# Patient Record
Sex: Male | Born: 1955 | Race: Black or African American | Hispanic: No | Marital: Single | Smoking: Former smoker
Health system: Southern US, Community
[De-identification: ages and names within clinical notes are randomized; demographics above are authoritative.]

## PROBLEM LIST (undated history)

## (undated) DIAGNOSIS — Z808 Family history of malignant neoplasm of other organs or systems: Secondary | ICD-10-CM

## (undated) DIAGNOSIS — H409 Unspecified glaucoma: Secondary | ICD-10-CM

## (undated) DIAGNOSIS — E079 Disorder of thyroid, unspecified: Secondary | ICD-10-CM

## (undated) DIAGNOSIS — I712 Thoracic aortic aneurysm, without rupture, unspecified: Secondary | ICD-10-CM

## (undated) DIAGNOSIS — D649 Anemia, unspecified: Secondary | ICD-10-CM

## (undated) DIAGNOSIS — H9190 Unspecified hearing loss, unspecified ear: Secondary | ICD-10-CM

## (undated) DIAGNOSIS — N182 Chronic kidney disease, stage 2 (mild): Secondary | ICD-10-CM

## (undated) DIAGNOSIS — C469 Kaposi's sarcoma, unspecified: Secondary | ICD-10-CM

## (undated) DIAGNOSIS — I10 Essential (primary) hypertension: Secondary | ICD-10-CM

## (undated) DIAGNOSIS — E785 Hyperlipidemia, unspecified: Secondary | ICD-10-CM

## (undated) DIAGNOSIS — Z952 Presence of prosthetic heart valve: Secondary | ICD-10-CM

## (undated) DIAGNOSIS — I3139 Other pericardial effusion (noninflammatory): Secondary | ICD-10-CM

## (undated) DIAGNOSIS — I2510 Atherosclerotic heart disease of native coronary artery without angina pectoris: Secondary | ICD-10-CM

## (undated) DIAGNOSIS — E669 Obesity, unspecified: Secondary | ICD-10-CM

## (undated) DIAGNOSIS — K648 Other hemorrhoids: Secondary | ICD-10-CM

## (undated) DIAGNOSIS — T7840XA Allergy, unspecified, initial encounter: Secondary | ICD-10-CM

## (undated) DIAGNOSIS — M549 Dorsalgia, unspecified: Secondary | ICD-10-CM

## (undated) DIAGNOSIS — I313 Pericardial effusion (noninflammatory): Secondary | ICD-10-CM

## (undated) HISTORY — DX: Unspecified glaucoma: H40.9

## (undated) HISTORY — PX: ABDOMINAL AORTIC ANEURYSM REPAIR: SUR1152

## (undated) HISTORY — DX: Disorder of thyroid, unspecified: E07.9

## (undated) HISTORY — DX: Dorsalgia, unspecified: M54.9

## (undated) HISTORY — DX: Chronic kidney disease, stage 2 (mild): N18.2

## (undated) HISTORY — DX: Obesity, unspecified: E66.9

## (undated) HISTORY — DX: Thoracic aortic aneurysm, without rupture: I71.2

## (undated) HISTORY — DX: Other pericardial effusion (noninflammatory): I31.39

## (undated) HISTORY — PX: KNEE ARTHROSCOPY: SUR90

## (undated) HISTORY — DX: Allergy, unspecified, initial encounter: T78.40XA

## (undated) HISTORY — DX: Essential (primary) hypertension: I10

## (undated) HISTORY — DX: Other hemorrhoids: K64.8

## (undated) HISTORY — PX: WISDOM TOOTH EXTRACTION: SHX21

## (undated) HISTORY — DX: Family history of malignant neoplasm of other organs or systems: Z80.8

## (undated) HISTORY — DX: Thoracic aortic aneurysm, without rupture, unspecified: I71.20

## (undated) HISTORY — DX: Hyperlipidemia, unspecified: E78.5

## (undated) HISTORY — PX: AORTIC VALVE REPLACEMENT: SHX41

## (undated) HISTORY — DX: Atherosclerotic heart disease of native coronary artery without angina pectoris: I25.10

## (undated) HISTORY — DX: Unspecified hearing loss, unspecified ear: H91.90

## (undated) HISTORY — DX: Kaposi's sarcoma, unspecified: C46.9

## (undated) HISTORY — DX: Anemia, unspecified: D64.9

## (undated) HISTORY — DX: Pericardial effusion (noninflammatory): I31.3

## (undated) HISTORY — PX: OTHER SURGICAL HISTORY: SHX169

---

## 2005-03-13 ENCOUNTER — Ambulatory Visit: Payer: Self-pay | Admitting: Internal Medicine

## 2005-03-20 ENCOUNTER — Ambulatory Visit: Payer: Self-pay | Admitting: Cardiology

## 2005-03-27 ENCOUNTER — Ambulatory Visit: Payer: Self-pay | Admitting: Cardiology

## 2005-03-31 ENCOUNTER — Ambulatory Visit: Payer: Self-pay | Admitting: Cardiology

## 2005-04-11 ENCOUNTER — Ambulatory Visit: Payer: Self-pay | Admitting: Cardiology

## 2005-04-25 ENCOUNTER — Ambulatory Visit: Payer: Self-pay | Admitting: Cardiology

## 2005-05-17 ENCOUNTER — Ambulatory Visit: Payer: Self-pay | Admitting: Cardiology

## 2005-06-13 ENCOUNTER — Ambulatory Visit: Payer: Self-pay | Admitting: Cardiology

## 2005-06-27 ENCOUNTER — Ambulatory Visit: Payer: Self-pay | Admitting: Cardiology

## 2005-07-19 ENCOUNTER — Ambulatory Visit: Payer: Self-pay | Admitting: Internal Medicine

## 2005-08-03 ENCOUNTER — Ambulatory Visit: Payer: Self-pay | Admitting: Internal Medicine

## 2005-08-24 ENCOUNTER — Ambulatory Visit: Payer: Self-pay | Admitting: Cardiology

## 2005-09-06 ENCOUNTER — Ambulatory Visit: Payer: Self-pay | Admitting: Cardiology

## 2005-09-19 ENCOUNTER — Ambulatory Visit: Payer: Self-pay | Admitting: Cardiology

## 2005-10-04 ENCOUNTER — Ambulatory Visit: Payer: Self-pay | Admitting: Cardiology

## 2005-10-24 ENCOUNTER — Ambulatory Visit: Payer: Self-pay | Admitting: Cardiology

## 2005-11-21 ENCOUNTER — Ambulatory Visit: Payer: Self-pay | Admitting: Cardiology

## 2005-12-19 ENCOUNTER — Ambulatory Visit: Payer: Self-pay | Admitting: *Deleted

## 2006-01-24 ENCOUNTER — Ambulatory Visit: Payer: Self-pay | Admitting: Cardiology

## 2006-02-06 ENCOUNTER — Ambulatory Visit: Payer: Self-pay | Admitting: Cardiology

## 2006-02-28 ENCOUNTER — Ambulatory Visit: Payer: Self-pay | Admitting: Cardiology

## 2006-03-07 ENCOUNTER — Ambulatory Visit: Payer: Self-pay | Admitting: Internal Medicine

## 2006-03-14 ENCOUNTER — Encounter: Payer: Self-pay | Admitting: Cardiology

## 2006-03-14 ENCOUNTER — Ambulatory Visit: Payer: Self-pay

## 2006-03-20 ENCOUNTER — Ambulatory Visit: Payer: Self-pay | Admitting: Gastroenterology

## 2006-03-21 ENCOUNTER — Ambulatory Visit: Payer: Self-pay | Admitting: Cardiology

## 2006-03-27 ENCOUNTER — Ambulatory Visit: Payer: Self-pay | Admitting: Cardiology

## 2006-04-06 ENCOUNTER — Ambulatory Visit: Payer: Self-pay | Admitting: Cardiology

## 2006-05-02 ENCOUNTER — Ambulatory Visit: Payer: Self-pay | Admitting: Internal Medicine

## 2006-05-30 ENCOUNTER — Ambulatory Visit: Payer: Self-pay | Admitting: Cardiovascular Disease

## 2006-06-27 ENCOUNTER — Ambulatory Visit: Payer: Self-pay | Admitting: Cardiovascular Disease

## 2006-08-16 ENCOUNTER — Ambulatory Visit: Payer: Self-pay | Admitting: *Deleted

## 2006-08-30 ENCOUNTER — Ambulatory Visit: Payer: Self-pay | Admitting: Cardiology

## 2006-09-27 ENCOUNTER — Ambulatory Visit: Payer: Self-pay | Admitting: Cardiology

## 2006-10-08 ENCOUNTER — Ambulatory Visit: Payer: Self-pay | Admitting: Internal Medicine

## 2006-11-13 ENCOUNTER — Ambulatory Visit: Payer: Self-pay | Admitting: Cardiovascular Disease

## 2006-11-26 ENCOUNTER — Ambulatory Visit: Payer: Self-pay | Admitting: Cardiology

## 2006-12-04 ENCOUNTER — Ambulatory Visit: Payer: Self-pay | Admitting: Internal Medicine

## 2006-12-18 ENCOUNTER — Ambulatory Visit: Payer: Self-pay | Admitting: Cardiology

## 2007-01-11 ENCOUNTER — Ambulatory Visit: Payer: Self-pay | Admitting: Cardiology

## 2007-01-31 ENCOUNTER — Ambulatory Visit: Payer: Self-pay | Admitting: Cardiology

## 2007-02-25 ENCOUNTER — Ambulatory Visit: Payer: Self-pay | Admitting: Internal Medicine

## 2007-02-25 LAB — CONVERTED CEMR LAB
ALT: 35 units/L (ref 0–53)
AST: 33 units/L (ref 0–37)
Basophils Absolute: 0 10*3/uL (ref 0.0–0.1)
Basophils Relative: 0.7 % (ref 0.0–1.0)
Bilirubin Urine: NEGATIVE
Bilirubin, Direct: 0.1 mg/dL (ref 0.0–0.3)
Chloride: 110 meq/L (ref 96–112)
Direct LDL: 178.8 mg/dL
Eosinophils Absolute: 0.2 10*3/uL (ref 0.0–0.6)
Eosinophils Relative: 4.8 % (ref 0.0–5.0)
HCT: 34.7 % — ABNORMAL LOW (ref 39.0–52.0)
HCV Ab: NEGATIVE
Hemoglobin: 11.1 g/dL — ABNORMAL LOW (ref 13.0–17.0)
Neutro Abs: 3 10*3/uL (ref 1.4–7.7)
PSA: 1.88 ng/mL
PSA: 1.88 ng/mL (ref 0.10–4.00)
Platelets: 222 10*3/uL (ref 150–400)
Potassium: 5 meq/L (ref 3.5–5.1)
RBC: 5.01 M/uL (ref 4.22–5.81)
RDW: 19.6 % — ABNORMAL HIGH (ref 11.5–14.6)
Specific Gravity, Urine: 1.015 (ref 1.000–1.03)
Transferrin: 382.9 mg/dL — ABNORMAL HIGH (ref >=212.0)
Triglycerides: 199 mg/dL — ABNORMAL HIGH (ref 0–149)
Urine Glucose: NEGATIVE mg/dL
VLDL: 40 mg/dL (ref 0–40)
WBC: 4.8 10*3/uL (ref 4.5–10.5)

## 2007-03-01 ENCOUNTER — Ambulatory Visit: Payer: Self-pay | Admitting: Cardiology

## 2007-03-05 ENCOUNTER — Encounter: Payer: Self-pay | Admitting: Internal Medicine

## 2007-03-05 DIAGNOSIS — M67919 Unspecified disorder of synovium and tendon, unspecified shoulder: Secondary | ICD-10-CM | POA: Insufficient documentation

## 2007-03-05 DIAGNOSIS — I10 Essential (primary) hypertension: Secondary | ICD-10-CM | POA: Insufficient documentation

## 2007-03-05 DIAGNOSIS — I712 Thoracic aortic aneurysm, without rupture, unspecified: Secondary | ICD-10-CM | POA: Insufficient documentation

## 2007-03-05 DIAGNOSIS — E785 Hyperlipidemia, unspecified: Secondary | ICD-10-CM | POA: Insufficient documentation

## 2007-03-05 DIAGNOSIS — E669 Obesity, unspecified: Secondary | ICD-10-CM | POA: Insufficient documentation

## 2007-03-05 DIAGNOSIS — I2510 Atherosclerotic heart disease of native coronary artery without angina pectoris: Secondary | ICD-10-CM | POA: Insufficient documentation

## 2007-03-05 DIAGNOSIS — K648 Other hemorrhoids: Secondary | ICD-10-CM | POA: Insufficient documentation

## 2007-03-05 DIAGNOSIS — I319 Disease of pericardium, unspecified: Secondary | ICD-10-CM | POA: Insufficient documentation

## 2007-03-05 DIAGNOSIS — M545 Low back pain, unspecified: Secondary | ICD-10-CM | POA: Insufficient documentation

## 2007-03-05 DIAGNOSIS — E039 Hypothyroidism, unspecified: Secondary | ICD-10-CM | POA: Insufficient documentation

## 2007-03-05 DIAGNOSIS — E038 Other specified hypothyroidism: Secondary | ICD-10-CM

## 2007-03-22 ENCOUNTER — Ambulatory Visit: Payer: Self-pay

## 2007-03-22 ENCOUNTER — Encounter: Payer: Self-pay | Admitting: Internal Medicine

## 2007-03-29 ENCOUNTER — Ambulatory Visit: Payer: Self-pay | Admitting: Cardiovascular Disease

## 2007-04-02 ENCOUNTER — Ambulatory Visit: Payer: Self-pay | Admitting: Gastroenterology

## 2007-04-15 ENCOUNTER — Ambulatory Visit: Payer: Self-pay | Admitting: Gastroenterology

## 2007-04-15 LAB — CONVERTED CEMR LAB
OCCULT 3: NEGATIVE
OCCULT 4: NEGATIVE
OCCULT 5: NEGATIVE

## 2007-04-19 ENCOUNTER — Ambulatory Visit: Payer: Self-pay | Admitting: Cardiovascular Disease

## 2007-05-03 ENCOUNTER — Ambulatory Visit: Payer: Self-pay | Admitting: Gastroenterology

## 2007-05-03 DIAGNOSIS — K2960 Other gastritis without bleeding: Secondary | ICD-10-CM | POA: Insufficient documentation

## 2007-05-21 ENCOUNTER — Ambulatory Visit: Payer: Self-pay | Admitting: Cardiology

## 2007-06-18 ENCOUNTER — Ambulatory Visit: Payer: Self-pay | Admitting: Cardiology

## 2007-07-16 ENCOUNTER — Ambulatory Visit: Payer: Self-pay | Admitting: Cardiology

## 2007-07-18 ENCOUNTER — Ambulatory Visit: Payer: Self-pay | Admitting: Cardiology

## 2007-07-19 ENCOUNTER — Telehealth (INDEPENDENT_AMBULATORY_CARE_PROVIDER_SITE_OTHER): Payer: Self-pay | Admitting: *Deleted

## 2007-08-06 ENCOUNTER — Ambulatory Visit: Payer: Self-pay | Admitting: Cardiology

## 2007-09-03 ENCOUNTER — Ambulatory Visit: Payer: Self-pay | Admitting: Cardiology

## 2007-10-02 ENCOUNTER — Ambulatory Visit: Payer: Self-pay | Admitting: Internal Medicine

## 2007-10-29 ENCOUNTER — Telehealth: Payer: Self-pay | Admitting: Internal Medicine

## 2007-10-29 DIAGNOSIS — D509 Iron deficiency anemia, unspecified: Secondary | ICD-10-CM | POA: Insufficient documentation

## 2007-11-01 ENCOUNTER — Ambulatory Visit: Payer: Self-pay | Admitting: Cardiology

## 2007-11-21 ENCOUNTER — Ambulatory Visit: Payer: Self-pay | Admitting: Internal Medicine

## 2007-11-22 LAB — CONVERTED CEMR LAB
AST: 54 units/L — ABNORMAL HIGH (ref 0–37)
Alkaline Phosphatase: 29 units/L — ABNORMAL LOW (ref 39–117)
Bilirubin Urine: NEGATIVE
Calcium: 9.2 mg/dL (ref 8.4–10.5)
Cholesterol: 187 mg/dL (ref 0–200)
Creatinine, Ser: 1.4 mg/dL (ref 0.4–1.5)
Eosinophils Absolute: 0.1 10*3/uL (ref 0.0–0.7)
Eosinophils Relative: 3.1 % (ref 0.0–5.0)
GFR calc non Af Amer: 57 mL/min
HCT: 46.5 % (ref 39.0–52.0)
Lymphocytes Relative: 35.5 % (ref 12.0–46.0)
MCHC: 34.7 g/dL (ref 30.0–36.0)
Mucus, UA: NEGATIVE
Neutro Abs: 2.1 10*3/uL (ref 1.4–7.7)
Neutrophils Relative %: 51.6 % (ref 43.0–77.0)
RBC / HPF: NONE SEEN
RBC: 5.33 M/uL (ref 4.22–5.81)
Sodium: 145 meq/L (ref 135–145)
TSH: 2.94 microintl units/mL (ref 0.35–5.50)
Total Bilirubin: 1 mg/dL (ref 0.3–1.2)
Total Protein: 6 g/dL (ref 6.0–8.3)
Triglycerides: 225 mg/dL (ref 0–149)
Urine Glucose: NEGATIVE mg/dL
Urobilinogen, UA: 0.2 (ref 0.0–1.0)
VLDL: 45 mg/dL — ABNORMAL HIGH (ref 0–40)
WBC: 4 10*3/uL — ABNORMAL LOW (ref 4.5–10.5)

## 2007-11-25 ENCOUNTER — Encounter: Payer: Self-pay | Admitting: Internal Medicine

## 2007-11-26 LAB — CONVERTED CEMR LAB: Hep B C IgM: NEGATIVE

## 2007-11-28 ENCOUNTER — Ambulatory Visit: Payer: Self-pay | Admitting: Internal Medicine

## 2007-12-03 ENCOUNTER — Ambulatory Visit: Payer: Self-pay | Admitting: Cardiovascular Disease

## 2007-12-31 ENCOUNTER — Ambulatory Visit: Payer: Self-pay | Admitting: Cardiovascular Disease

## 2008-01-27 ENCOUNTER — Ambulatory Visit: Payer: Self-pay | Admitting: Cardiology

## 2008-02-25 ENCOUNTER — Ambulatory Visit: Payer: Self-pay | Admitting: Cardiology

## 2008-03-10 ENCOUNTER — Ambulatory Visit: Payer: Self-pay | Admitting: Internal Medicine

## 2008-03-24 ENCOUNTER — Ambulatory Visit: Payer: Self-pay | Admitting: Cardiology

## 2008-03-30 ENCOUNTER — Encounter: Payer: Self-pay | Admitting: Internal Medicine

## 2008-03-30 ENCOUNTER — Ambulatory Visit: Payer: Self-pay

## 2008-04-21 ENCOUNTER — Ambulatory Visit: Payer: Self-pay | Admitting: Cardiology

## 2008-05-19 ENCOUNTER — Ambulatory Visit: Payer: Self-pay | Admitting: Cardiology

## 2008-06-16 ENCOUNTER — Ambulatory Visit: Payer: Self-pay | Admitting: Cardiology

## 2008-06-30 ENCOUNTER — Ambulatory Visit: Payer: Self-pay | Admitting: Cardiology

## 2008-07-22 ENCOUNTER — Ambulatory Visit: Payer: Self-pay | Admitting: Cardiology

## 2008-08-18 ENCOUNTER — Ambulatory Visit: Payer: Self-pay | Admitting: Cardiology

## 2008-09-08 ENCOUNTER — Ambulatory Visit: Payer: Self-pay | Admitting: Internal Medicine

## 2008-09-29 ENCOUNTER — Ambulatory Visit: Payer: Self-pay | Admitting: Cardiology

## 2008-10-06 ENCOUNTER — Ambulatory Visit: Payer: Self-pay | Admitting: Cardiology

## 2008-10-27 ENCOUNTER — Ambulatory Visit: Payer: Self-pay | Admitting: Cardiology

## 2008-11-24 ENCOUNTER — Ambulatory Visit: Payer: Self-pay | Admitting: Cardiology

## 2008-12-22 ENCOUNTER — Ambulatory Visit: Payer: Self-pay | Admitting: Cardiology

## 2008-12-29 ENCOUNTER — Encounter: Payer: Self-pay | Admitting: *Deleted

## 2009-01-08 ENCOUNTER — Telehealth: Payer: Self-pay | Admitting: Internal Medicine

## 2009-01-19 ENCOUNTER — Ambulatory Visit: Payer: Self-pay | Admitting: Cardiovascular Disease

## 2009-01-19 LAB — CONVERTED CEMR LAB
POC INR: 2.8
Protime: 20.4

## 2009-02-02 ENCOUNTER — Telehealth: Payer: Self-pay | Admitting: Internal Medicine

## 2009-02-03 ENCOUNTER — Encounter: Payer: Self-pay | Admitting: *Deleted

## 2009-02-05 ENCOUNTER — Telehealth: Payer: Self-pay | Admitting: Internal Medicine

## 2009-02-08 ENCOUNTER — Ambulatory Visit: Payer: Self-pay | Admitting: Internal Medicine

## 2009-02-15 ENCOUNTER — Ambulatory Visit: Payer: Self-pay | Admitting: Internal Medicine

## 2009-02-15 LAB — CONVERTED CEMR LAB
AST: 47 units/L — ABNORMAL HIGH (ref 0–37)
Basophils Absolute: 0 10*3/uL (ref 0.0–0.1)
Basophils Relative: 0.9 % (ref 0.0–3.0)
Bilirubin Urine: NEGATIVE
CO2: 28 meq/L (ref 19–32)
Calcium: 8.6 mg/dL (ref 8.4–10.5)
Direct LDL: 129.4 mg/dL
GFR calc non Af Amer: 62.77 mL/min (ref >60)
HCT: 45.5 % (ref 39.0–52.0)
Leukocytes, UA: NEGATIVE
Lymphs Abs: 1.3 10*3/uL (ref 0.7–4.0)
Monocytes Absolute: 0.4 10*3/uL (ref 0.1–1.0)
Monocytes Relative: 9 % (ref 3.0–12.0)
Platelets: 119 10*3/uL — ABNORMAL LOW (ref 150.0–400.0)
RDW: 12.6 % (ref 11.5–14.6)
Sodium: 143 meq/L (ref 135–145)
Specific Gravity, Urine: >=1.03 (ref 1.000–1.030)
TSH: 2.05 microintl units/mL (ref 0.35–5.50)
Total CHOL/HDL Ratio: 6
VLDL: 37 mg/dL (ref 0.0–40.0)
pH: 5.5 (ref 5.0–8.0)

## 2009-02-16 ENCOUNTER — Ambulatory Visit: Payer: Self-pay | Admitting: Cardiology

## 2009-02-16 LAB — CONVERTED CEMR LAB
POC INR: 3.9
Prothrombin Time: 23.8 s

## 2009-03-02 ENCOUNTER — Ambulatory Visit: Payer: Self-pay | Admitting: Cardiovascular Disease

## 2009-03-02 LAB — CONVERTED CEMR LAB: Prothrombin Time: 18.2 s

## 2009-03-30 ENCOUNTER — Ambulatory Visit: Payer: Self-pay | Admitting: Internal Medicine

## 2009-04-02 ENCOUNTER — Encounter: Payer: Self-pay | Admitting: Internal Medicine

## 2009-04-02 ENCOUNTER — Ambulatory Visit: Payer: Self-pay | Admitting: Internal Medicine

## 2009-04-02 DIAGNOSIS — I723 Aneurysm of iliac artery: Secondary | ICD-10-CM | POA: Insufficient documentation

## 2009-04-02 LAB — CONVERTED CEMR LAB: POC INR: 2.2

## 2009-04-07 ENCOUNTER — Encounter: Payer: Self-pay | Admitting: Internal Medicine

## 2009-04-27 ENCOUNTER — Ambulatory Visit: Payer: Self-pay | Admitting: Vascular Surgery

## 2009-04-27 ENCOUNTER — Encounter: Payer: Self-pay | Admitting: Internal Medicine

## 2009-05-04 ENCOUNTER — Ambulatory Visit: Payer: Self-pay | Admitting: Cardiology

## 2009-05-04 LAB — CONVERTED CEMR LAB: POC INR: 2.3

## 2009-05-18 ENCOUNTER — Encounter: Admission: RE | Admit: 2009-05-18 | Discharge: 2009-05-18 | Payer: Self-pay | Admitting: Vascular Surgery

## 2009-05-18 ENCOUNTER — Ambulatory Visit: Payer: Self-pay | Admitting: Vascular Surgery

## 2009-05-18 ENCOUNTER — Encounter: Payer: Self-pay | Admitting: Internal Medicine

## 2009-06-01 ENCOUNTER — Ambulatory Visit: Payer: Self-pay | Admitting: Cardiology

## 2009-06-01 LAB — CONVERTED CEMR LAB: POC INR: 2.1

## 2009-06-29 ENCOUNTER — Ambulatory Visit: Payer: Self-pay | Admitting: Cardiovascular Disease

## 2009-08-02 ENCOUNTER — Ambulatory Visit: Payer: Self-pay | Admitting: Cardiology

## 2009-08-02 LAB — CONVERTED CEMR LAB: POC INR: 2.8

## 2009-08-12 ENCOUNTER — Ambulatory Visit: Payer: Self-pay | Admitting: Internal Medicine

## 2009-08-12 DIAGNOSIS — IMO0002 Reserved for concepts with insufficient information to code with codable children: Secondary | ICD-10-CM | POA: Insufficient documentation

## 2009-08-31 ENCOUNTER — Ambulatory Visit: Payer: Self-pay | Admitting: Internal Medicine

## 2009-09-28 ENCOUNTER — Ambulatory Visit: Payer: Self-pay | Admitting: Internal Medicine

## 2009-11-04 ENCOUNTER — Ambulatory Visit: Payer: Self-pay | Admitting: Cardiovascular Disease

## 2009-11-04 LAB — CONVERTED CEMR LAB: POC INR: 1.6

## 2009-11-25 ENCOUNTER — Ambulatory Visit: Payer: Self-pay | Admitting: Cardiology

## 2009-12-28 ENCOUNTER — Ambulatory Visit: Payer: Self-pay | Admitting: Cardiology

## 2010-02-01 ENCOUNTER — Ambulatory Visit: Payer: Self-pay | Admitting: Internal Medicine

## 2010-02-02 ENCOUNTER — Telehealth: Payer: Self-pay | Admitting: Internal Medicine

## 2010-03-01 ENCOUNTER — Ambulatory Visit: Payer: Self-pay | Admitting: Cardiology

## 2010-03-01 LAB — CONVERTED CEMR LAB: POC INR: 2.3

## 2010-03-10 ENCOUNTER — Ambulatory Visit: Payer: Self-pay | Admitting: Internal Medicine

## 2010-03-10 LAB — CONVERTED CEMR LAB
BUN: 29 mg/dL — ABNORMAL HIGH (ref 6–23)
Basophils Absolute: 0 10*3/uL (ref 0.0–0.1)
Basophils Relative: 0.4 % (ref 0.0–3.0)
Bilirubin, Direct: 0.1 mg/dL (ref 0.0–0.3)
CO2: 27 meq/L (ref 19–32)
Calcium: 8.9 mg/dL (ref 8.4–10.5)
Cholesterol: 208 mg/dL — ABNORMAL HIGH (ref 0–200)
Creatinine, Ser: 1.2 mg/dL (ref 0.4–1.5)
Direct LDL: 114.7 mg/dL
Eosinophils Absolute: 0.1 10*3/uL (ref 0.0–0.7)
GFR calc non Af Amer: 81.67 mL/min (ref >60)
Glucose, Bld: 92 mg/dL (ref 70–99)
Hemoglobin, Urine: NEGATIVE
Hemoglobin: 14.9 g/dL (ref 13.0–17.0)
Ketones, ur: NEGATIVE mg/dL
Lymphocytes Relative: 29.1 % (ref 12.0–46.0)
Lymphs Abs: 1.3 10*3/uL (ref 0.7–4.0)
MCHC: 35 g/dL (ref 30.0–36.0)
Monocytes Relative: 8.7 % (ref 3.0–12.0)
Neutro Abs: 2.6 10*3/uL (ref 1.4–7.7)
Neutrophils Relative %: 58.8 % (ref 43.0–77.0)
Platelets: 147 10*3/uL — ABNORMAL LOW (ref 150.0–400.0)
RBC: 4.98 M/uL (ref 4.22–5.81)
RDW: 13.7 % (ref 11.5–14.6)
Sodium: 142 meq/L (ref 135–145)
Specific Gravity, Urine: 1.025 (ref 1.000–1.030)
Total CHOL/HDL Ratio: 5
Total Protein: 6.5 g/dL (ref 6.0–8.3)
Triglycerides: 302 mg/dL — ABNORMAL HIGH (ref 0.0–149.0)
Urine Glucose: NEGATIVE mg/dL
Urobilinogen, UA: 0.2 (ref 0.0–1.0)
VLDL: 60.4 mg/dL — ABNORMAL HIGH (ref 0.0–40.0)
WBC: 4.4 10*3/uL — ABNORMAL LOW (ref 4.5–10.5)

## 2010-03-17 ENCOUNTER — Ambulatory Visit: Payer: Self-pay | Admitting: Internal Medicine

## 2010-03-17 ENCOUNTER — Telehealth: Payer: Self-pay | Admitting: Internal Medicine

## 2010-03-17 ENCOUNTER — Encounter: Payer: Self-pay | Admitting: Internal Medicine

## 2010-03-17 DIAGNOSIS — I359 Nonrheumatic aortic valve disorder, unspecified: Secondary | ICD-10-CM | POA: Insufficient documentation

## 2010-03-17 DIAGNOSIS — M722 Plantar fascial fibromatosis: Secondary | ICD-10-CM | POA: Insufficient documentation

## 2010-03-29 ENCOUNTER — Encounter: Payer: Self-pay | Admitting: Internal Medicine

## 2010-03-29 ENCOUNTER — Ambulatory Visit: Payer: Self-pay | Admitting: Cardiovascular Disease

## 2010-03-29 ENCOUNTER — Ambulatory Visit: Payer: Self-pay

## 2010-03-29 ENCOUNTER — Ambulatory Visit: Payer: Self-pay | Admitting: Cardiology

## 2010-03-29 ENCOUNTER — Ambulatory Visit (HOSPITAL_COMMUNITY): Admission: RE | Admit: 2010-03-29 | Discharge: 2010-03-29 | Payer: Self-pay | Admitting: Internal Medicine

## 2010-04-07 ENCOUNTER — Telehealth: Payer: Self-pay | Admitting: Internal Medicine

## 2010-04-15 ENCOUNTER — Ambulatory Visit: Payer: Self-pay | Admitting: Cardiovascular Disease

## 2010-04-15 LAB — CONVERTED CEMR LAB
CO2: 26 meq/L (ref 19–32)
Creatinine, Ser: 1.3 mg/dL (ref 0.4–1.5)
GFR calc non Af Amer: 75.73 mL/min (ref >60)
Glucose, Bld: 84 mg/dL (ref 70–99)
Sodium: 139 meq/L (ref 135–145)

## 2010-04-20 ENCOUNTER — Ambulatory Visit: Payer: Self-pay | Admitting: Cardiology

## 2010-04-26 ENCOUNTER — Ambulatory Visit: Payer: Self-pay | Admitting: Cardiology

## 2010-05-17 ENCOUNTER — Ambulatory Visit: Payer: Self-pay | Admitting: Internal Medicine

## 2010-05-17 LAB — CONVERTED CEMR LAB: POC INR: 4.4

## 2010-06-07 ENCOUNTER — Ambulatory Visit: Payer: Self-pay | Admitting: Cardiology

## 2010-06-07 LAB — CONVERTED CEMR LAB: POC INR: 3.2

## 2010-06-28 ENCOUNTER — Ambulatory Visit: Payer: Self-pay | Admitting: Cardiology

## 2010-06-28 LAB — CONVERTED CEMR LAB: POC INR: 5.5

## 2010-07-07 ENCOUNTER — Ambulatory Visit: Payer: Self-pay | Admitting: Internal Medicine

## 2010-08-02 ENCOUNTER — Ambulatory Visit: Admission: RE | Admit: 2010-08-02 | Discharge: 2010-08-02 | Payer: Self-pay | Source: Home / Self Care

## 2010-08-30 ENCOUNTER — Ambulatory Visit: Admit: 2010-08-30 | Payer: Self-pay

## 2010-08-30 NOTE — Medication Information (Signed)
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 no    Recent/future dental: no  Any missed doses?: no       Is patient compliant with meds? yes       Allergies: No Known Drug Allergies  Anticoagulation Management History:      The patient is taking warfarin and comes in today for a routine follow up visit.  Negative risk factors for bleeding include an age less than 29 years old.  The bleeding index is 'low risk'.  Positive CHADS2 values include History of HTN.  Negative CHADS2 values include Age > 51 years old.  The start date was 07/31/2004.  Anticoagulation responsible provider: Eden Emms MD, Theron Arista.  INR POC: 1.6.  Cuvette Lot#: [id number].  Exp: 11/2010.    Anticoagulation Management Assessment/Plan:      The patient's current anticoagulation dose is Coumadin 5 mg tabs: Take as directed by coumadin clinic..  The target INR is 2.0-3.0.  The next INR is due 11/25/2009.  Anticoagulation instructions were given to patient.  Results were reviewed/authorized by Bethena Midget, RN, BSN.  He was notified by Bethena Midget, RN, BSN.         Prior Anticoagulation Instructions: INR 2.3  Continue on same dosage 1.5 tablets daily except 1 tablet on Tuesdays and Saturdays.  Recheck in 4 weeks.    Current Anticoagulation Instructions: INR 1.6 Today take extra 5mg s then resume 7.5mg s daily except 5mg s on Tuesdays and Saturdays. Recheck in 3 weeks.

## 2010-08-30 NOTE — Medication Information (Signed)
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 yes       Details: Took 12 day Prednisone tapering dose which was completed last Monday.   Recent/future dental: no  Any missed doses?: no       Is patient compliant with meds? yes       Allergies: No Known Drug Allergies  Anticoagulation Management History:      The patient is taking warfarin and comes in today for a routine follow up visit.  Negative risk factors for bleeding include an age less than 27 years old.  The bleeding index is 'low risk'.  Positive CHADS2 values include History of HTN.  Negative CHADS2 values include Age > 72 years old.  The start date was 07/31/2004.  Anticoagulation responsible provider: Bensimhon MD, Reuel Boom.  INR POC: 3.2.  Cuvette Lot#: [id number].  Exp: 10/2009.    Anticoagulation Management Assessment/Plan:      The patient's current anticoagulation dose is Coumadin 5 mg tabs: Take as directed by coumadin clinic..  The target INR is 2.0-3.0.  The next INR is due 09/28/2009.  Anticoagulation instructions were given to patient.  Results were reviewed/authorized by Bethena Midget, RN, BSN.  He was notified by Bethena Midget, RN, BSN.         Prior Anticoagulation Instructions: INR 2.8  Continue on same dosage 1.5 tablets daily except 1 tablet on Tuesdays and Saturdays.   Recheck in 4 weeks.    Current Anticoagulation Instructions: INR 3.2 Skip todays dose then resume 7.5mg s daily except 5mg  on Tuesdays and Saturdays. Recheck in 4 weeks.

## 2010-08-30 NOTE — Medication Information (Signed)
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   Health status changes: no    Bleeding/hemorrhagic complications: no    Recent/future hospitalizations: no    Any changes in medication regimen? no    Recent/future dental: no  Any missed doses?: no       Is patient compliant with meds? yes       Allergies: No Known Drug Allergies  Anticoagulation Management History:      The patient is taking warfarin and comes in today for a routine follow up visit.  Negative risk factors for bleeding include an age less than 47 years old.  The bleeding index is 'low risk'.  Positive CHADS2 values include History of HTN.  Negative CHADS2 values include Age > 84 years old.  The start date was 07/31/2004.  Anticoagulation responsible Ryleigh Esqueda: Jens Som MD, Arlys [name].  INR POC: 2.3.  Cuvette Lot#: [id number].  Exp: 05/2011.    Anticoagulation Management Assessment/Plan:      The patient's current anticoagulation dose is Coumadin 5 mg tabs: Take as directed by coumadin clinic..  The target INR is 2.0-3.0.  The next INR is due 03/29/2010.  Anticoagulation instructions were given to patient.  Results were reviewed/authorized by Cloyde Reams, RN, BSN.  He was notified by Cloyde Reams RN.         Prior Anticoagulation Instructions: INR 2.6 Continue 7.5mg s daily except 5mg s on Tuesdays and Saturdays. Recheck in 4 weeks.   Current Anticoagulation Instructions: INR 2.3  Continue on same dosage 7.5mg  daily except 5mg  on Tuesdays and Saturdays.  Recheck in 4 weeks.

## 2010-08-30 NOTE — Progress Notes (Signed)
Summary: Rx refill req  Phone Note Refill Request Message from:  Patient on February 02, 2010 10:17 AM  Refills Requested: Medication #1:  CRESTOR 20 MG  TABS 1 by mouth once daily   Dosage confirmed as above?Dosage Confirmed   Supply Requested: 3 months  Method Requested: Fax to Local Pharmacy Next Appointment Scheduled: 03/17/2010 Initial call taken by: Margaret Pyle, CMA,  February 02, 2010 10:17 AM    Prescriptions: CRESTOR 20 MG  TABS (ROSUVASTATIN CALCIUM) 1 by mouth once daily  #90 x 3   Entered by:   Margaret Pyle, CMA   Authorized by:   Corwin Levins MD   Signed by:   Margaret Pyle, CMA on 02/02/2010   Method used:   Electronically to        [facility]* (retail)             ,          Ph: [phone]       Fax: [phone]   RxID:   [id number]

## 2010-08-30 NOTE — Medication Information (Signed)
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 no    Recent/future dental: no  Any missed doses?: no       Is patient compliant with meds? yes       Allergies: No Known Drug Allergies  Anticoagulation Management History:      The patient is taking warfarin and comes in today for a routine follow up visit.  Negative risk factors for bleeding include an age less than 54 years old.  The bleeding index is 'low risk'.  Positive CHADS2 values include History of HTN.  Negative CHADS2 values include Age > 90 years old.  The start date was 07/31/2004.  Anticoagulation responsible provider: Shirlee Latch MD, Rheda Kassab.  INR POC: 2.0.  Cuvette Lot#: [id number].  Exp: 03/2011.    Anticoagulation Management Assessment/Plan:      The patient's current anticoagulation dose is Coumadin 5 mg tabs: Take as directed by coumadin clinic..  The target INR is 2.0-3.0.  The next INR is due 02/01/2010.  Anticoagulation instructions were given to patient.  Results were reviewed/authorized by Eda Keys, PharmD.  He was notified by Eda Keys.         Prior Anticoagulation Instructions: INR 2.3  Coumadin 1 tab = 5mg  on Tu and Sat 1 and 1/2 tab = 7.5mg  all other days  Current Anticoagulation Instructions: INR 2.0  Take an extra 1/2 tablet today.  Then return to normal dosing schedule of 1 tablet on Tuesday and Saturday and 1.5 tablets all other days.  Return to clinici n 4 weeks.

## 2010-08-30 NOTE — Assessment & Plan Note (Signed)
Summary: cpx-lb   Vital Signs:  Patient profile:   55 year old male Height:      68 inches Weight:      211.75 pounds BMI:     32.31 O2 Sat:      97 % on Room air Temp:     98.5 degrees F oral Pulse rate:   64 / minute BP sitting:   112 / 70  (left arm) Cuff size:   large  Vitals Entered By: Zella Ball Ewing CMA (AAMA) (March 17, 2010 8:45 AM)  O2 Flow:  Room air  CC: Adult Physical/RE   CC:  Adult Physical/RE.  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) 3)  Anemia-iron Deficiency  (ICD-280.9) 4)  Preventive Health Care  (ICD-V70.0) 5)  Hx of Internal Hemorrhoids  (ICD-455.0) 6)  Hx of Anemia, Iron Deficiency  (ICD-280.9) 7)  Erosive Gastritis  (ICD-535.40) 8)  Low Back Pain  (ICD-724.2) 9)  Coumadin Therapy  (ICD-V58.61) 10)  Hypothyroidism, 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 Take As Directed By Coumadin Clinic. 2)  Toprol Xl 200 Mg Tb24 (Metoprolol Succinate) .... Take 1 Tablet By Mouth Once A Day 3)  Crestor 20 Mg  Tabs (Rosuvastatin Calcium) .Marland Kitchen.. 1 By Mouth Once Daily 4)  Daily Multi   Tabs (Multiple  Vitamins-Minerals) .Marland Kitchen.. 1 By Mouth Qd 5)  Ferrous Sulfate 325 (65 Fe) Mg  Tbec (Ferrous Sulfate) .Marland Kitchen.. 1 By Mouth Qd 6)  Losartan Potassium 50 Mg Tabs (Losartan Potassium) .Marland Kitchen.. 1 By Mouth Once Daily 7)  Oxycodone Hcl 5 Mg Tabs (Oxycodone Hcl) .Marland Kitchen.. 1-2 By Mouth Q 6 Hrs As Needed 8)  Cyclobenzaprine Hcl 5 Mg Tabs (Cyclobenzaprine Hcl) .Marland Kitchen.. 1 By Mouth Three Times A Day As Needed 9)  Prednisone 10 Mg Tabs (Prednisone) .... 4po Qd For 3days, Then 3po Qd For 3days, Then 2po Qd For 3days, Then 1po Qd For 3 Days, Then Stop  Current Medications (verified): 1)  Coumadin 5 Mg Tabs (Warfarin Sodium) .... Take As Directed By Coumadin Clinic. 2)  Toprol Xl 200 Mg Tb24 (Metoprolol Succinate) .... Take 1 Tablet By Mouth Once A Day 3)  Crestor 20 Mg  Tabs (Rosuvastatin Calcium) .Marland Kitchen.. 1 By Mouth Once Daily 4)  Daily Multi   Tabs (Multiple Vitamins-Minerals) .Marland Kitchen.. 1 By Mouth Qd 5)  Ferrous Sulfate 325 (65 Fe) Mg  Tbec (Ferrous Sulfate) .Marland Kitchen.. 1 By Mouth Qd 6)  Losartan Potassium 50 Mg Tabs (Losartan Potassium) .Marland Kitchen.. 1 By Mouth Once Daily 7)  Oxycodone Hcl 5 Mg Tabs (Oxycodone Hcl) .Marland Kitchen.. 1-2 By Mouth Q 6 Hrs As Needed 8)  Cyclobenzaprine Hcl 5 Mg Tabs (  Cyclobenzaprine Hcl) .Marland Kitchen.. 1 By Mouth Three Times A Day As Needed 9)  Prednisone 10 Mg Tabs (Prednisone) .... 4po Qd For 3days, Then 3po Qd For 3days, Then 2po Qd For 3days, Then 1po Qd For 3 Days, Then Stop  Allergies (verified): No Known Drug Allergies  Past History:  Past Medical History: Last updated: 08/12/2009 Coronary artery disease Hyperlipidemia Hypertension Obesity Internal Hemorrhoids H/O R Shoulder Impingement H/O Thoracic Aortic Aneurysm H/O Pericardial Effusion H/O Abnornal Thyroid PAnel, having received amiodone Coumadin Therapy Low back pain Anemia-iron deficiency  Past Surgical History: Last updated: 03/05/2007 Abdominal aortic aneurysm repair- 2003 Aortic Valve Replacement: - 2003 L Knee Arthroscopy  Family History: Last updated:  11/28/2007 HTN  Social History: Last updated: 11/28/2007 Single no children work - [employer] - [employer] Former Smoker Alcohol use-yes master's degree  Risk Factors: Smoking Status: quit (11/28/2007)  Review of Systems  The patient denies anorexia, fever, vision loss, decreased hearing, hoarseness, chest pain, syncope, dyspnea on exertion, peripheral edema, prolonged cough, headaches, hemoptysis, abdominal pain, melena, hematochezia, severe indigestion/heartburn, hematuria, muscle weakness, suspicious skin lesions, transient blindness, difficulty walking, depression, unusual weight change, abnormal bleeding, enlarged lymph nodes, and angioedema.         all otherwise negative per pt -  except for persistent right heel pain for 8 mo;    Physical Exam  General:  alert and overweight-appearing.   Head:  normocephalic and atraumatic.   Eyes:  vision grossly intact, pupils equal, and pupils round.   Ears:  R ear normal and L ear normal.   Nose:  no external deformity and no nasal discharge.   Mouth:  no gingival abnormalities and pharynx pink and moist.   Neck:  supple and no masses.   Lungs:  normal respiratory effort and normal breath sounds.   Heart:  normal rate and regular rhythm. with click Abdomen:  soft, non-tender, and normal bowel sounds.   Msk:  no joint tenderness and no joint swelling.  with tender right heel Extremities:  no edema, no erythema  Neurologic:  cranial nerves II-XII intact and strength normal in all extremities.     Impression & Recommendations:  Problem # 1:  Preventive Health Care (ICD-V70.0)  Overall doing well, age appropriate education and counseling updated and referral for appropriate preventive services done unless declined, immunizations up to date or declined, diet counseling done if overweight, urged to quit smoking if smokes , most recent labs reviewed and current ordered if appropriate, ecg reviewed or declined (interpretation per ECG  scanned in the EMR if done); information regarding Medicare Prevention requirements given if appropriate; speciality referrals updated as appropriate   Orders: EKG w/ Interpretation (93000)  Problem # 2:  AORTIC VALVE DISORDERS (ICD-424.1)  His updated medication list for this problem includes:    Coumadin 5 Mg Tabs (Warfarin sodium) .Marland Kitchen... Take as directed by coumadin clinic.    Toprol Xl 200 Mg Tb24 (Metoprolol succinate) .Marland Kitchen... Take 1 tablet by mouth once a day for echo, refer cardiology  Orders: Echo Referral (Echo) Cardiology Referral (Cardiology)  Problem # 3:  PLANTAR FASCIITIS, RIGHT (ICD-728.71)  His updated medication list for this problem includes:    Celebrex 200 Mg Caps (Celecoxib) .Marland Kitchen... 1 by mouth two times a day as needed treat as above, f/u any worsening signs or symptoms, refer podiatry  Problem # 4:  HYPERLIPIDEMIA (ICD-272.4)  His updated medication list for this problem includes:    Crestor 20 Mg Tabs (Rosuvastatin calcium) .Marland Kitchen... 1 by mouth  once daily    Fenofibrate 160 Mg Tabs (Fenofibrate) .Marland Kitchen... 1po once daily to add the fibrate - Continue all previous medications as before this visit   Labs Reviewed: SGOT: 35 (03/10/2010)   SGPT: 45 (03/10/2010)   HDL:39.40 (03/10/2010), 35.30 (02/15/2009)  LDL:DEL (11/21/2007), DEL (02/25/2007)  Chol:208 (03/10/2010), 207 (02/15/2009)  Trig:302.0 (03/10/2010), 185.0 (02/15/2009)  Complete Medication List: 1)  Coumadin 5 Mg Tabs (Warfarin sodium) .... Take as directed by coumadin clinic. 2)  Toprol Xl 200 Mg Tb24 (Metoprolol succinate) .... Take 1 tablet by mouth once a day 3)  Crestor 20 Mg Tabs (Rosuvastatin calcium) .Marland Kitchen.. 1 by mouth once daily 4)  Daily Multi Tabs (Multiple vitamins-minerals) .Marland Kitchen.. 1 by mouth qd 5)  Ferrous Sulfate 325 (65 Fe) Mg Tbec (Ferrous sulfate) .Marland Kitchen.. 1 by mouth qd 6)  Losartan Potassium 50 Mg Tabs (Losartan potassium) .Marland Kitchen.. 1 by mouth once daily 7)  Fenofibrate 160 Mg Tabs (Fenofibrate) .Marland Kitchen.. 1po  once daily 8)  Celebrex 200 Mg Caps (Celecoxib) .Marland Kitchen.. 1 by mouth two times a day as needed   Patient Instructions: 1)  Please take all new medications as prescribed 2)  Continue all previous medications as before this visit  3)  You will be contacted about the referral(s) to: Echo, cardiology, and podiatry 4)  Please schedule a follow-up appointment in 1 year or sooner if needed Prescriptions: CELEBREX 200 MG CAPS (CELECOXIB) 1 by mouth two times a day as needed  #60 x 3   Entered and Authorized by:   Corwin Levins MD   Signed by:   Corwin Levins MD on 03/17/2010   Method used:   Print then Give to Patient   RxID:   [id number] FENOFIBRATE 160 MG TABS (FENOFIBRATE) 1po once daily  #90 x 3   Entered and Authorized by:   Corwin Levins MD   Signed by:   Corwin Levins MD on 03/17/2010   Method used:   Print then Give to Patient   RxID:   [id number]

## 2010-08-30 NOTE — Medication Information (Signed)
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 no    Recent/future dental: no  Any missed doses?: no       Is patient compliant with meds? yes       Current Medications (verified): 1)  Coumadin 5 Mg Tabs (Warfarin Sodium) .... Take As Directed By Coumadin Clinic. 2)  Toprol Xl 200 Mg Tb24 (Metoprolol Succinate) .... Take 1 Tablet By Mouth Once A Day 3)  Crestor 20 Mg  Tabs (Rosuvastatin Calcium) .Marland Kitchen.. 1 By Mouth Once Daily 4)  Daily Multi   Tabs (Multiple Vitamins-Minerals) .Marland Kitchen.. 1 By Mouth Qd 5)  Ferrous Sulfate 325 (65 Fe) Mg  Tbec (Ferrous Sulfate) .Marland Kitchen.. 1 By Mouth Qd 6)  Losartan Potassium 50 Mg Tabs (Losartan Potassium) .Marland Kitchen.. 1 By Mouth Once Daily  Allergies (verified): No Known Drug Allergies  Anticoagulation Management History:      The patient is taking warfarin and comes in today for a routine follow up visit.  Negative risk factors for bleeding include an age less than 66 years old.  The bleeding index is 'low risk'.  Positive CHADS2 values include History of HTN.  Negative CHADS2 values include Age > 4 years old.  The start date was 07/31/2004.  Anticoagulation responsible provider: Jens Som MD, Arlys [name].  INR POC: 2.8.  Cuvette Lot#: [id number].  Exp: 08/2009.    Anticoagulation Management Assessment/Plan:      The patient's current anticoagulation dose is Coumadin 5 mg tabs: Take as directed by coumadin clinic..  The target INR is 2.0-3.0.  The next INR is due 08/31/2009.  Anticoagulation instructions were given to patient.  Results were  reviewed/authorized by Cloyde Reams, RN, BSN.  He was notified by Cloyde Reams RN.         Prior Anticoagulation Instructions: INR 2.6  Continue to take 1 & 1/2  tablet every day except on Tuesday and Saturday take 1 tablet. Recheck INR in 5 weeks.  Current Anticoagulation Instructions: INR 2.8  Continue on same dosage 1.5 tablets daily except 1 tablet on Tuesdays and Saturdays.   Recheck in 4 weeks.

## 2010-08-30 NOTE — Medication Information (Signed)
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 no    Recent/future dental: no  Any missed doses?: no       Is patient compliant with meds? yes       Allergies (verified): No Known Drug Allergies  Anticoagulation Management History:      The patient is taking warfarin and comes in today for a routine follow up visit.  Negative risk factors for bleeding include an age less than 46 years old.  The bleeding index is 'low risk'.  Positive CHADS2 values include History of HTN.  Negative CHADS2 values include Age > 54 years old.  The start date was 07/31/2004.  Anticoagulation responsible provider: Delano Scardino MD, Reuel Boom.  INR POC: 2.3.  Cuvette Lot#: [id number].  Exp: 11/2010.    Anticoagulation Management Assessment/Plan:      The patient's current anticoagulation dose is Coumadin 5 mg tabs: Take as directed by coumadin clinic..  The target INR is 2.0-3.0.  The next INR is due 10/26/2009.  Anticoagulation instructions were given to patient.  Results were reviewed/authorized by Cloyde Reams, RN, BSN.  He was notified by Cloyde Reams RN.         Prior Anticoagulation Instructions: INR 3.2 Skip todays dose then resume 7.5mg s daily except 5mg  on Tuesdays and Saturdays. Recheck in 4 weeks.   Current Anticoagulation Instructions: INR 2.3  Continue on same dosage 1.5 tablets daily except 1 tablet on Tuesdays and Saturdays.  Recheck in 4 weeks.

## 2010-08-30 NOTE — Assessment & Plan Note (Signed)
Summary: ec6/aortic valve disorder/last seen in 06 by mcdowell/jml      Allergies Added: NKDA  Visit Type:  Initial Consult  CC:  None.  History of Present Illness: 55 yo AAM with history of HTN, hyperlipidemia and ascending aortic aneursym dissection with repair at [facility] in 2003 with [employer] Jude aortic valve prosthesis on chronic coumadin therapy here today to establish care with me. He was last seen by cardiology in December of 2008 by Dr. Diona Browner. His surgery was at [facility] in 2003. Records are reviewed and indicate that he has had a residula thoracic and abdominal aortic dissection since then. HIs last imaging was in October of 2010 and per report showed large dissection extending from ascending aorta down into the abdominal aorta. The false lumen in the descending thoracic aortic aneurysm is larger than the true lumen. There is some involvement of the arch vessels and renal arteries. He was also found to have a right lung nodule and lesions in the liver and kidneys that were hard to characterize. Recent echo 03/29/10 with normal LV function with mean gradient of 22 mg Hg across the aortic mechanical prosthesis.   He has been feeling well. No chest pain, SOB, palpitations, near syncope or syncope.   Current Medications (verified): 1)  Coumadin 5 Mg Tabs (Warfarin Sodium) .... Take As Directed By Coumadin Clinic. 2)  Toprol Xl 200 Mg Tb24 (Metoprolol Succinate) .... Take 1 Tablet By Mouth Once A Day 3)  Crestor 20 Mg  Tabs (Rosuvastatin Calcium) .Marland Kitchen.. 1 By Mouth Once Daily 4)  Daily Multi   Tabs (Multiple Vitamins-Minerals) .Marland Kitchen.. 1 By Mouth Qd 5)  Ferrous Sulfate 325 (65 Fe) Mg  Tbec (Ferrous Sulfate) .Marland Kitchen.. 1 By Mouth Qd 6)  Losartan Potassium 50 Mg Tabs (Losartan Potassium) .Marland Kitchen.. 1 By Mouth Once Daily 7)  Fenofibrate 160 Mg Tabs (Fenofibrate) .Marland Kitchen.. 1po Once Daily 8)  Celebrex 200 Mg Caps (Celecoxib) .Marland Kitchen.. 1 By Mouth Two Times A Day As Needed  Allergies (verified): No Known Drug Allergies  Past  History:  Past Medical History: Coronary artery disease-? no records to support prior cath Hyperlipidemia Hypertension Obesity Internal Hemorrhoids H/O R Shoulder Impingement H/O Thoracic Aortic Aneurysm H/O Pericardial Effusion H/O Abnornal Thyroid PAnel, having received amiodone Coumadin Therapy Low back pain Anemia-iron deficiency  Past Surgical History: Abdominal aortic aneurysm repair- 2003 Aortic Valve Replacement: - 2003 L Knee Arthroscopy Wisdom teeth removal  Family History: Reviewed history from 11/28/2007 and no changes required. Mother-alive and healthy Father-alive with ? liver cancer 2 siblings, both alive and healthy  Social History: Reviewed history from 11/28/2007 and no changes required. 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    Vital Signs:  Patient profile:   55 year old male Height:      68 inches Weight:      210.25 pounds BMI:     32.08 Pulse rate:   58 / minute Pulse rhythm:   regular Resp:     18 per minute BP sitting:   120 / 84  (  left arm) Cuff size:   large  Vitals Entered By: Vikki Ports (April 15, 2010 11:51 AM)  Physical Exam  General:  General: Well developed, well nourished, NAD HEENT: OP clear, mucus membranes moist SKIN: warm, dry Neuro: No focal deficits Musculoskeletal: Muscle strength 5/5 all ext Psychiatric: Mood  and affect normal Neck: No JVD, transmitted sounds both carotids, no thyromegaly, no lymphadenopathy. Lungs:Clear bilaterally, no wheezes, rhonci, crackles CV: RRR with valve click and loud systolic  murmur, No gallops rubs.  Abdomen: soft, NT, ND, BS present Extremities: No edema, pulses 2+.    Echocardiogram  Procedure date:  03/29/2010  Findings:      Left ventricle: The cavity size was mildly dilated. Wall thickness       was increased in a pattern of moderate LVH. Systolic function was       normal. The estimated ejection fraction was in the range of 55% to       60%.     - Aortic valve: Mechanical AVR with normal disc motion. Gradients       somewhat high. Only trivial AR. Dilated aortic root. ? Root       replacement wrapped       with native aorta. Consdier TEE or CT to further assess     - Left atrium: The atrium was mildly dilated.     - Atrial septum: No defect or patent foramen ovale was identified.  EKG  Procedure date:  04/15/2010  Findings:      Sinus bradycardia, rate 59 bpm. Incomplete RBBB.  CT Scan  Procedure date:  05/18/2009  Findings:      1.  Thoracic aortic dissection as detailed above.  Surgical changes   at the junction of the ascending and transverse segment, with   residual or recurrent dissection at the aortic root beginning just   above the origin of the right coronary artery.   2.  Extension into the great vessels as detailed above including   within the aberrant right subclavian artery.   3.  Aneurysmal component of the proximal descending thoracic aorta.   4.  4 mm lung nodule. If the patient is at high risk for   bronchogenic carcinoma, follow-up chest CT at 1 year is   recommended.  Extension of aortic dissection in the abdomen.  Dissection   involves the renal arteries with resultant marked renal scarring   bilaterally.   2.  Left renal lesion which could represent a complex cyst or less   likely a neoplasm.  Consider either  dedicated pre and post contrast   abdominal MRI or attention on follow-up imaging.   3.  Probable right renal collecting system tiny calculus.  1.  Extension of dissection into the pelvic vasculature as   described.   2.  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   Impression & Recommendations:  Problem # 1:  THORACIC AORTIC ANEURYSM (ICD-441.2) Chronic but after review of CT from October, I feel that a follow up CT angiogram of the chest, abdomen and pelvis is indicated. He also has a lung nodule that needs to be followed. Will check BMET today and if renal function ok, will proceed with repeat CT angiogram. This is indicated for the lung nodule as well as to assess the size of the ascending aorta and to assess the large dissection that extends around the arch and into the distal  Orders: CT Scan  (CT Scan) Echocardiogram (Echo)  Problem # 2:  AORTIC VALVE DISORDERS (ICD-424.1) He is on coumadin therapy. Mean gradient of 22 mmHg across valve. Will repeat echo in 6 months.   His updated medication list for this problem includes:    Toprol Xl 200 Mg Tb24 (Metoprolol succinate) .Marland Kitchen... Take 1 tablet by mouth once a day    Losartan Potassium 50 Mg Tabs (Losartan potassium) .Marland Kitchen... 1 by mouth once daily  His updated medication list for this problem includes:    Toprol Xl 200 Mg Tb24 (Metoprolol succinate) .Marland Kitchen... Take 1 tablet by mouth once a day    Losartan Potassium 50 Mg Tabs (Losartan potassium) .Marland Kitchen... 1 by mouth once daily  Other Orders: EKG w/ Interpretation (93000) TLB-BMP (Basic Metabolic Panel-BMET) (80048-METABOL)  Patient Instructions: 1)  Your physician recommends that you schedule a follow-up appointment in: 6 months 2)  Your physician recommends that you continue on your current medications as directed. Please refer to the Current Medication list given to you today. 3)  Non-Cardiac CT Angiography (CTA), is a special type of CT scan that uses a computer to  produce multi-dimensional views of major blood vessels throughout the body. In CT angiography, a contrast material is injected through an IV to help visualize the blood vessels. 4)  Your physician has requested that you have an echocardiogram in 6 months. Echocardiography is a painless test that uses sound waves to create images of your heart. It provides your doctor with information about the size and shape of your heart and how well your heart's chambers and valves are working.  This procedure takes approximately one hour. There are no restrictions for this procedure. 5)  Your physician recommends that you have lab work today.

## 2010-08-30 NOTE — Assessment & Plan Note (Signed)
Summary: BACK SPASMS/NWS   Vital Signs:  Patient profile:   55 year old male Height:      68 inches Weight:      224 pounds BMI:     34.18 O2 Sat:      99 % on Room air Temp:     97.7 degrees F oral Pulse rate:   51 / minute BP sitting:   130 / 86  (left arm) Cuff size:   regular  Vitals Entered ByMarland Kitchen Zella Ball Ewing (August 12, 2009 3:50 PM)  O2 Flow:  Room air CC: back pain,flu shot/RE   CC:  back pain and flu shot/RE.  History of Present Illness: here with awakening todya with lower lumbar pain, moderate wtih severe at times - worse to stand, twist or bend;  has to walk slower;  no fever, night sweats, wt loss (iin fact gained a  couple of lbs over the holidatys);  consatnat and dull with sharp exacerbations;  does have radiaiton to the right leg to mid calf; no LLE pain;  no weakness or numbness except ? weak to the righ leg; no change in bowel or bladder function.  NO falls or injury.  Does remember slipping a bit with walking the dog but no major slip or fall. no other obvious inciting event.  No prior significant back issues.  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 Take As Directed By Coumadin Clinic. 2)  Toprol Xl 200 Mg Tb24 (Metoprolol Succinate) .... Take 1 Tablet By Mouth Once A Day 3)  Crestor 20 Mg  Tabs (Rosuvastatin Calcium) .Marland Kitchen.. 1 By Mouth Once Daily 4)  Daily Multi   Tabs (Multiple Vitamins-Minerals) .Marland Kitchen.. 1 By Mouth Qd 5)  Ferrous Sulfate 325 (65 Fe) Mg  Tbec (Ferrous Sulfate) .Marland Kitchen.. 1 By Mouth Qd 6)  Losartan Potassium 50 Mg Tabs (Losartan Potassium) .Marland Kitchen.. 1 By Mouth Once Daily  Current Medications (verified): 1)  Coumadin 5 Mg Tabs (Warfarin Sodium) .... Take As Directed By Coumadin Clinic. 2)  Toprol Xl 200 Mg Tb24 (Metoprolol Succinate) .... Take 1 Tablet By Mouth Once A Day 3)  Crestor 20 Mg  Tabs (Rosuvastatin Calcium) .Marland Kitchen.. 1 By Mouth Once Daily 4)  Daily Multi   Tabs (Multiple Vitamins-Minerals) .Marland Kitchen.. 1 By Mouth Qd 5)  Ferrous Sulfate 325 (65 Fe) Mg  Tbec (Ferrous Sulfate) .Marland Kitchen.. 1 By Mouth Qd 6)  Losartan Potassium 50 Mg Tabs (Losartan Potassium) .Marland Kitchen.. 1 By Mouth Once Daily 7)  Oxycodone Hcl 5 Mg Tabs (Oxycodone Hcl) .Marland Kitchen.. 1-2 By Mouth Q 6 Hrs As Needed 8)  Cyclobenzaprine Hcl 5 Mg Tabs (Cyclobenzaprine Hcl) .Marland Kitchen.. 1 By Mouth Three Times A Day As Needed 9)  Prednisone 10 Mg Tabs (Prednisone) .... 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   Head:  normocephalic and atraumatic.   Eyes:  vision grossly intact, pupils equal, and pupils round.   Ears:  R ear normal and L ear normal.   Nose:  no external deformity and no nasal discharge.   Mouth:  no gingival abnormalities and pharynx pink and moist.   Neck:  supple and no masses.   Lungs:  normal respiratory effort and normal breath sounds.   Heart:  normal rate and regular rhythm.   Msk:  mild right lumbar paravertebral tedner, right sciatic notch tender;  no spine tender throughout Extremities:  no edema, no erythema  Neurologic:  alert & oriented X3, cranial nerves II-XII intact, strength normal in all extremities, and sensation intact to light touch.  except slight decreased sensation right l2 area, and mild prox right leg weakness - not clear how much weakness due to pain   Impression & Recommendations:  Problem # 1:  LUMBAR RADICULOPATHY, RIGHT (ICD-724.4)  His updated medication list for this problem includes:    Oxycodone Hcl 5 Mg Tabs (Oxycodone hcl) .Marland Kitchen... 1-2 by mouth q 6 hrs as needed    Cyclobenzaprine Hcl 5 Mg Tabs (Cyclobenzaprine hcl) .Marland Kitchen... 1 by mouth three times a day as needed new onset right radicular pain ; tx with above, as well as prednisone burst and taper off;  declines MRI ls spine but I suspect if does not improve may need to r/o disc disease and/or need for surgical intervention  Problem # 2:  HYPERTENSION (ICD-401.9)  His updated medication list for this problem includes:    Toprol Xl 200 Mg Tb24 (Metoprolol succinate) .Marland Kitchen... Take 1 tablet by mouth once a day    Losartan Potassium 50 Mg Tabs (Losartan potassium) .Marland Kitchen... 1 by mouth once daily  BP  today: 130/86 Prior BP: 130/100 (03/30/2009)  Labs Reviewed: K+: 4.5 (02/15/2009) Creat: : 1.5 (02/15/2009)   Chol: 207 (02/15/2009)   HDL: 35.30 (02/15/2009)   LDL: DEL (11/21/2007)   TG: 185.0 (02/15/2009) stable overall by hx and exam, ok to continue meds/tx as is   Complete Medication List: 1)  Coumadin 5 Mg Tabs (Warfarin sodium) .... Take as directed by coumadin clinic. 2)  Toprol Xl 200 Mg Tb24 (Metoprolol succinate) .... Take 1 tablet by mouth once a day 3)  Crestor 20 Mg Tabs (Rosuvastatin calcium) .Marland Kitchen.. 1 by mouth once daily 4)  Daily Multi Tabs (Multiple vitamins-minerals) .Marland Kitchen.. 1 by mouth qd 5)  Ferrous Sulfate 325 (65 Fe) Mg Tbec (Ferrous sulfate) .Marland KitchenMarland KitchenMarland Kitchen 1  by mouth qd 6)  Losartan Potassium 50 Mg Tabs (Losartan potassium) .Marland Kitchen.. 1 by mouth once daily 7)  Oxycodone Hcl 5 Mg Tabs (Oxycodone hcl) .Marland Kitchen.. 1-2 by mouth q 6 hrs as needed 8)  Cyclobenzaprine Hcl 5 Mg Tabs (Cyclobenzaprine hcl) .Marland Kitchen.. 1 by mouth three times a day as needed 9)  Prednisone 10 Mg Tabs (Prednisone) .... 4po qd for 3days, then 3po qd for 3days, then 2po qd for 3days, then 1po qd for 3 days, then stop  Other Orders: Admin 1st Vaccine (78469) Flu Vaccine 71yrs + (62952)  Patient Instructions: 1)  Please take all new medications as prescribed 2)  Continue all previous medications as before this visit  3)  please call in one wk or less if the pain worsens or persists, if you would like to have the MRI for the lower back 4)  you had the flu shot today 5)  Please schedule a follow-up appointment as recommended at your last office visit Prescriptions: PREDNISONE 10 MG TABS (PREDNISONE) 4po qd for 3days, then 3po qd for 3days, then 2po qd for 3days, then 1po qd for 3 days, then stop  #30 x 0   Entered and Authorized by:   Corwin Levins MD   Signed by:   Corwin Levins MD on 08/12/2009   Method used:   Print then Give to Patient   RxID:   [id number] CYCLOBENZAPRINE HCL 5 MG TABS (CYCLOBENZAPRINE HCL) 1 by mouth  three times a day as needed  #60 x 1   Entered and Authorized by:   Corwin Levins MD   Signed by:   Corwin Levins MD on 08/12/2009   Method used:   Print then Give to Patient   RxID:   [id number] OXYCODONE HCL 5 MG TABS (OXYCODONE HCL) 1-2 by mouth q 6 hrs as needed  #60 x 0   Entered and Authorized by:   Corwin Levins MD   Signed by:   Corwin Levins MD on 08/12/2009   Method used:   Print then Give to Patient   RxID:   [id number]     Flu Vaccine Consent Questions     Do you have a history of severe allergic reactions to this vaccine? no    Any prior history of allergic reactions to egg and/or gelatin? no    Do you have a sensitivity to the preservative Thimersol? no    Do you have a past history of Guillan-Barre Syndrome? no    Do you currently have an acute febrile illness? no    Have you ever had a severe reaction to latex? no    Vaccine information given and explained to patient? yes    Are you currently pregnant? no    Lot Number:[id number]   Exp Date:01/27/2010   Site Given  Left Deltoid IMflu

## 2010-08-30 NOTE — Progress Notes (Signed)
Summary: [facility] Drug Interaction  Phone Note From Pharmacy   Caller: [facility] Pharmacist Summary of Call: [facility] called to inform MD that there is a drug interaction with Fenofibrate, Celebrex and Warfarin. Per pharmacist bith medication can increase the effects of Warfarin. [facility] is requesting PCP and specifically Coumadin Clinic be advise do pt's INR can be monitors closely.  Initial call taken by: Margaret Pyle, CMA,  April 07, 2010 10:16 AM  Follow-up for Phone Call        noted; ok to cont meds as is Follow-up by: Corwin Levins MD,  April 07, 2010 1:17 PM  Additional Follow-up for Phone Call Additional follow up Details #1::        Pt and [facility] Gery Pray [phone]) informed Additional Follow-up by: Margaret Pyle, CMA,  April 08, 2010 2:53 PM

## 2010-08-30 NOTE — Miscellaneous (Signed)
Summary: Orders Update   Clinical Lists Changes  Orders: Added new Service order of Est. Patient 40-64 years (99396) - Signed 

## 2010-08-30 NOTE — Medication Information (Signed)
Summary: rov/sl  Anticoagulant Therapy  Managed by: Bethena Midget, RN, BSN Referring MD: Nona Dell Supervising MD: Myrtis Ser MD, Tinnie Gens Indication 1: Aortic Valve Replacement (ICD-V43.3) Indication 2: Aortic Valve Disorder (ICD-424.1) Lab Used: [facility] Site: [facility] INR POC 3.5 INR RANGE 2 - 3  Dietary changes: no    Health status changes: no    Bleeding/hemorrhagic complications: no    Recent/future hospitalizations: no    Any changes in medication regimen? yes       Details: Started Celebrex few weeks ago  Recent/future dental: no  Any missed doses?: no       Is patient compliant with meds? yes       Allergies: No Known Drug Allergies  Anticoagulation Management History:      The patient is taking warfarin and comes in today for a routine follow up visit.  Negative risk factors for bleeding include an age less than 31 years old.  The bleeding index is 'low risk'.  Positive CHADS2 values include History of HTN.  Negative CHADS2 values include Age > 95 years old.  The start date was 07/31/2004.  Anticoagulation responsible provider: Myrtis Ser MD, Tinnie Gens.  INR POC: 3.5.  Cuvette Lot#: [id number].  Exp: 06/2011.    Anticoagulation Management Assessment/Plan:      The patient's current anticoagulation dose is Coumadin 5 mg tabs: Take as directed by coumadin clinic..  The target INR is 2.0-3.0.  The next INR is due 05/17/2010.  Anticoagulation instructions were given to patient.  Results were reviewed/authorized by Bethena Midget, RN, BSN.  He was notified by Bethena Midget, RN, BSN.         Prior Anticoagulation Instructions: INR 2.1  Continue taking Coumadin 1.5 tabs (7.5 mg) on all days except Coumadin 1 tab (5 mg) on Tues and Sat. Return to clinic in 4 weeks.   Current Anticoagulation Instructions: INR 3.5 Skip today's dose then resume 7.5mg s daily except 5mg s on Tuesdays and Saturdays. Recheck in 3 weeks.

## 2010-08-30 NOTE — Medication Information (Signed)
Summary: rov/tm  Anticoagulant Therapy  Managed by: Weston Brass, PharmD Referring MD: Nona Dell Supervising MD: Gala Romney MD, Reuel Boom Indication 1: Aortic Valve Replacement (ICD-V43.3) Indication 2: Aortic Valve Disorder (ICD-424.1) Lab Used: [facility] Site: [facility] INR POC 4.4 INR RANGE 2 - 3  Dietary changes: no    Health status changes: no    Bleeding/hemorrhagic complications: no    Recent/future hospitalizations: no    Any changes in medication regimen? no    Recent/future dental: no  Any missed doses?: no       Is patient compliant with meds? yes       Allergies: No Known Drug Allergies  Anticoagulation Management History:      The patient is taking warfarin and comes in today for a routine follow up visit.  Negative risk factors for bleeding include an age less than 44 years old.  The bleeding index is 'low risk'.  Positive CHADS2 values include History of HTN.  Negative CHADS2 values include Age > 11 years old.  The start date was 07/31/2004.  Anticoagulation responsible provider: Santonio Speakman MD, Reuel Boom.  INR POC: 4.4.  Cuvette Lot#: [id number].  Exp: 06/2011.    Anticoagulation Management Assessment/Plan:      The patient's current anticoagulation dose is Coumadin 5 mg tabs: Take as directed by coumadin clinic..  The target INR is 2.0-3.0.  The next INR is due 06/07/2010.  Anticoagulation instructions were given to patient.  Results were reviewed/authorized by Weston Brass, PharmD.  He was notified by Ilean Skill D candidate.         Prior Anticoagulation Instructions: INR 3.5 Skip today's dose then resume 7.5mg s daily except 5mg s on Tuesdays and Saturdays. Recheck in 3 weeks.   Current Anticoagulation Instructions: INR 4.4  Skip today's dose. Then take 1 1/2 tablet everyday except 1 tablet on Tuesday, Thursday, and Saturday. Recheck in 3 weeks.

## 2010-08-30 NOTE — Medication Information (Signed)
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 no    Recent/future dental: no  Any missed doses?: no       Is patient compliant with meds? yes       Allergies: No Known Drug Allergies  Anticoagulation Management History:      The patient is taking warfarin and comes in today for a routine follow up visit.  Negative risk factors for bleeding include an age less than 38 years old.  The bleeding index is 'low risk'.  Positive CHADS2 values include History of HTN.  Negative CHADS2 values include Age > 9 years old.  The start date was 07/31/2004.  Anticoagulation responsible provider: Jens Som MD, Arlys [name].  INR POC: 2.1.  Cuvette Lot#: [id number].  Exp: 05/2011.    Anticoagulation Management Assessment/Plan:      The patient's current anticoagulation dose is Coumadin 5 mg tabs: Take as directed by coumadin clinic..  The target INR is 2.0-3.0.  The next INR is due 04/26/2010.  Anticoagulation instructions were given to patient.  Results were reviewed/authorized by Reina Fuse, PharmD.  He was notified by Reina Fuse PharmD.         Prior Anticoagulation Instructions: INR 2.3  Continue on same dosage 7.5mg  daily except 5mg  on Tuesdays and Saturdays.  Recheck in 4 weeks.    Current Anticoagulation Instructions: INR 2.1  Continue taking Coumadin 1.5 tabs (7.5 mg) on all days except Coumadin 1 tab (5 mg) on Tues and Sat. Return to clinic in 4 weeks.

## 2010-08-30 NOTE — Medication Information (Signed)
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 no    Recent/future dental: no  Any missed doses?: no       Is patient compliant with meds? yes       Current Medications (verified): 1)  Coumadin 5 Mg Tabs (Warfarin Sodium) .... Take As Directed By Coumadin Clinic. 2)  Toprol Xl 200 Mg Tb24 (Metoprolol Succinate) .... Take 1 Tablet By Mouth Once A Day 3)  Crestor 20 Mg  Tabs (Rosuvastatin Calcium) .Marland Kitchen.. 1 By Mouth Once Daily 4)  Daily Multi   Tabs (Multiple Vitamins-Minerals) .Marland Kitchen.. 1 By Mouth Qd 5)  Ferrous Sulfate 325 (65 Fe) Mg  Tbec (Ferrous Sulfate) .Marland Kitchen.. 1 By Mouth Qd 6)  Losartan Potassium 50 Mg Tabs (Losartan Potassium) .Marland Kitchen.. 1 By Mouth Once Daily 7)  Oxycodone Hcl 5 Mg Tabs (Oxycodone Hcl) .Marland Kitchen.. 1-2 By Mouth Q 6 Hrs As Needed 8)  Cyclobenzaprine Hcl 5 Mg Tabs (Cyclobenzaprine Hcl) .Marland Kitchen.. 1 By Mouth Three Times A Day As Needed 9)  Prednisone 10 Mg Tabs (Prednisone) .... 4po Qd For 3days, Then 3po Qd For 3days, Then 2po Qd For 3days, Then 1po Qd For 3 Days, Then Stop  Allergies (verified): No Known Drug Allergies  Anticoagulation Management History:      Negative risk factors for bleeding include an age less than 76 years old.  The bleeding index is 'low risk'.  Positive CHADS2 values include History of HTN.  Negative CHADS2 values include Age > 69 years old.  The start date was 07/31/2004.  Anticoagulation responsible provider: Daleen Squibb MD, Maisie Fus.  INR POC: 2.3.  Exp: 11/2010.    Anticoagulation Management Assessment/Plan:      The patient's current  anticoagulation dose is Coumadin 5 mg tabs: Take as directed by coumadin clinic..  The target INR is 2.0-3.0.  The next INR is due 12/23/2009.  Anticoagulation instructions were given to patient.  Results were reviewed/authorized by Leota Sauers, PharmD, BCPS, CPP.         Prior Anticoagulation Instructions: INR 1.6 Today take extra 5mg s then resume 7.5mg s daily except 5mg s on Tuesdays and Saturdays. Recheck in 3 weeks.   Current Anticoagulation Instructions: INR 2.3  Coumadin 1 tab = 5mg  on Tu and Sat 1 and 1/2 tab = 7.5mg  all other days

## 2010-08-30 NOTE — Medication Information (Signed)
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 no    Recent/future dental: no  Any missed doses?: no       Is patient compliant with meds? yes       Allergies: No Known Drug Allergies  Anticoagulation Management History:      The patient is taking warfarin and comes in today for a routine follow up visit.  Negative risk factors for bleeding include an age less than 49 years old.  The bleeding index is 'low risk'.  Positive CHADS2 values include History of HTN.  Negative CHADS2 values include Age > 71 years old.  The start date was 07/31/2004.  Anticoagulation responsible provider: Patty Sermons MD, Maisie Fus.  INR POC: 5.5.  Cuvette Lot#: [id number].  Exp: 07/2011.    Anticoagulation Management Assessment/Plan:      The patient's current anticoagulation dose is Coumadin 5 mg tabs: Take as directed by coumadin clinic..  The target INR is 2.0-3.0.  The next INR is due 07/07/2010.  Anticoagulation instructions were given to patient.  Results were reviewed/authorized by Bethena Midget, RN, BSN.  He was notified by Bethena Midget, RN, BSN.         Prior Anticoagulation Instructions: INR 3.2 Skip today's dose then continue 7.5mg s daily except 5mg s on Tuesdays, Thursdays and Saturdays. Recheck in 3 weeks.   Current Anticoagulation Instructions: INR 5.5 Skip todays and Wednesdays dose then change dose to 5mg s daily except 7.5mg s on Mondays, Wednesdays and Fridays. Recheck in 7-10 days.

## 2010-08-30 NOTE — Medication Information (Signed)
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 no    Recent/future dental: no  Any missed doses?: no       Is patient compliant with meds? yes       Allergies: No Known Drug Allergies  Anticoagulation Management History:      The patient is taking warfarin and comes in today for a routine follow up visit.  Negative risk factors for bleeding include an age less than 36 years old.  The bleeding index is 'low risk'.  Positive CHADS2 values include History of HTN.  Negative CHADS2 values include Age > 80 years old.  The start date was 07/31/2004.  Anticoagulation responsible provider: Alayne Estrella MD, Reuel Boom.  INR POC: 2.6.  Cuvette Lot#: [id number].  Exp: 03/2011.    Anticoagulation Management Assessment/Plan:      The patient's current anticoagulation dose is Coumadin 5 mg tabs: Take as directed by coumadin clinic..  The target INR is 2.0-3.0.  The next INR is due 03/01/2010.  Anticoagulation instructions were given to patient.  Results were reviewed/authorized by Bethena Midget, RN, BSN.  He was notified by Bethena Midget, RN, BSN.         Prior Anticoagulation Instructions: INR 2.0  Take an extra 1/2 tablet today.  Then return to normal dosing schedule of 1 tablet on Tuesday and Saturday and 1.5 tablets all other days.  Return to clinici n 4 weeks.      Current Anticoagulation Instructions: INR 2.6 Continue 7.5mg s daily except 5mg s on Tuesdays and Saturdays. Recheck in 4 weeks.

## 2010-08-30 NOTE — Medication Information (Signed)
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 no    Recent/future dental: no  Any missed doses?: no       Is patient compliant with meds? yes      Comments: Pt unable to return before new year he states.   Allergies: No Known Drug Allergies  Anticoagulation Management History:      The patient is taking warfarin and comes in today for a routine follow up visit.  Negative risk factors for bleeding include an age less than 80 years old.  The bleeding index is 'low risk'.  Positive CHADS2 values include History of HTN.  Negative CHADS2 values include Age > 71 years old.  The start date was 07/31/2004.  Anticoagulation responsible provider: Bensimhon MD, Reuel Boom.  INR POC: 2.7.  Cuvette Lot#: [id number].  Exp: 09/2011.    Anticoagulation Management Assessment/Plan:      The patient's current anticoagulation dose is Coumadin 5 mg tabs: Take as directed by coumadin clinic..  The target INR is 2.0-3.0.  The next INR is due 08/02/2010.  Anticoagulation instructions were given to patient.  Results were reviewed/authorized by Bethena Midget, RN, BSN.  He was notified by Bethena Midget, RN, BSN.         Prior Anticoagulation Instructions: INR 5.5 Skip todays and Wednesdays dose then change dose to 5mg s daily except 7.5mg s on Mondays, Wednesdays and Fridays. Recheck in 7-10 days.   Current Anticoagulation Instructions: INR 2.7 Continue 5mg s daily except 7.5mg s on Mondays, Wednesdays, and Fridays. Recheck in 3 weeks.

## 2010-08-30 NOTE — Medication Information (Signed)
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 yes       Details: Celebrex discontinued 2 weeks ago   Recent/future dental: no  Any missed doses?: no       Is patient compliant with meds? yes       Allergies: No Known Drug Allergies  Anticoagulation Management History:      The patient is taking warfarin and comes in today for a routine follow up visit.  Negative risk factors for bleeding include an age less than 68 years old.  The bleeding index is 'low risk'.  Positive CHADS2 values include History of HTN.  Negative CHADS2 values include Age > 19 years old.  The start date was 07/31/2004.  Anticoagulation responsible provider: Myrtis Ser MD, Tinnie Gens.  INR POC: 3.2.  Cuvette Lot#: [id number].  Exp: 06/2011.    Anticoagulation Management Assessment/Plan:      The patient's current anticoagulation dose is Coumadin 5 mg tabs: Take as directed by coumadin clinic..  The target INR is 2.0-3.0.  The next INR is due 06/28/2010.  Anticoagulation instructions were given to patient.  Results were reviewed/authorized by Bethena Midget, RN, BSN.  He was notified by Bethena Midget, RN, BSN.         Prior Anticoagulation Instructions: INR 4.4  Skip today's dose. Then take 1 1/2 tablet everyday except 1 tablet on Tuesday, Thursday, and Saturday. Recheck in 3 weeks.   Current Anticoagulation Instructions: INR 3.2 Skip today's dose then continue 7.5mg s daily except 5mg s on Tuesdays, Thursdays and Saturdays. Recheck in 3 weeks.

## 2010-08-30 NOTE — Progress Notes (Signed)
Summary: medication refills  Phone Note Refill Request Message from:  Patient on March 17, 2010 10:31 AM  Refills Requested: Medication #1:  TOPROL XL 200 MG TB24 Take 1 tablet by mouth once a day   Dosage confirmed as above?Dosage Confirmed   Notes: [facility]  Medication #2:  COUMADIN 5 MG TABS Take as directed by coumadin clinic.   Dosage confirmed as above?Dosage Confirmed   Notes: [facility]  Medication #3:  LOSARTAN POTASSIUM 50 MG TABS 1 by mouth once daily   Dosage confirmed as above?Dosage Confirmed   Notes: [facility] Initial call taken by: Robin Ewing CMA Duncan Dull),  March 17, 2010 10:32 AM    Prescriptions: LOSARTAN POTASSIUM 50 MG TABS (LOSARTAN POTASSIUM) 1 by mouth once daily  #90 x 3   Entered by:   Scharlene Gloss CMA (AAMA)   Authorized by:   Corwin Levins MD   Signed by:   Scharlene Gloss CMA (AAMA) on 03/17/2010   Method used:   Faxed to ...       [facility] (mail-order)             , [place]         Ph: [phone]       Fax: [phone]   RxID:   [id number] TOPROL XL 200 MG TB24 (METOPROLOL SUCCINATE) Take 1 tablet by mouth once a day  #90 x 3   Entered by:   Scharlene Gloss CMA (AAMA)   Authorized by:   Corwin Levins MD   Signed by:   Scharlene Gloss CMA (AAMA) on 03/17/2010   Method used:   Faxed to ...       [facility] (mail-order)             , [place]         Ph: [phone]       Fax: [phone]   RxID:   [id number] COUMADIN 5 MG TABS (WARFARIN SODIUM) Take as directed by coumadin clinic.  #120 x 3   Entered by:   Scharlene Gloss CMA (AAMA)   Authorized by:   Corwin Levins MD   Signed by:   Scharlene Gloss CMA (AAMA) on 03/17/2010   Method used:   Faxed to ...       [facility] (mail-order)             , [place]         Ph: [phone]       Fax: [phone]   RxID:   [id number]

## 2010-09-01 NOTE — Medication Information (Signed)
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 no    Recent/future dental: no  Any missed doses?: no       Is patient compliant with meds? yes       Allergies: No Known Drug Allergies  Anticoagulation Management History:      The patient is taking warfarin and comes in today for a routine follow up visit.  Negative risk factors for bleeding include an age less than 18 years old.  The bleeding index is 'low risk'.  Positive CHADS2 values include History of HTN.  Negative CHADS2 values include Age > 87 years old.  The start date was 07/31/2004.  Anticoagulation responsible provider: Eden Emms MD, Theron Arista.  INR POC: 2.4.  Cuvette Lot#: [id number].  Exp: 09/2011.    Anticoagulation Management Assessment/Plan:      The patient's current anticoagulation dose is Coumadin 5 mg tabs: Take as directed by coumadin clinic..  The target INR is 2.0-3.0.  The next INR is due 08/30/2010.  Anticoagulation instructions were given to patient.  Results were reviewed/authorized by Bethena Midget, RN, BSN.  He was notified by Bethena Midget, RN, BSN.         Prior Anticoagulation Instructions: INR 2.7 Continue 5mg s daily except 7.5mg s on Mondays, Wednesdays, and Fridays. Recheck in 3 weeks.   Current Anticoagulation Instructions: INR 2.4 Continue 5mg s everyday except 7.5mg s on Mondays, Wednesdays and Fridays. Recheck in 4 weeks.

## 2010-09-14 ENCOUNTER — Encounter: Payer: Self-pay | Admitting: Internal Medicine

## 2010-09-14 ENCOUNTER — Encounter (INDEPENDENT_AMBULATORY_CARE_PROVIDER_SITE_OTHER): Payer: BC Managed Care – PPO

## 2010-09-14 DIAGNOSIS — Z7901 Long term (current) use of anticoagulants: Secondary | ICD-10-CM

## 2010-09-14 DIAGNOSIS — I359 Nonrheumatic aortic valve disorder, unspecified: Secondary | ICD-10-CM

## 2010-09-14 LAB — CONVERTED CEMR LAB: POC INR: 3.4

## 2010-09-20 DIAGNOSIS — I359 Nonrheumatic aortic valve disorder, unspecified: Secondary | ICD-10-CM

## 2010-09-20 DIAGNOSIS — Z952 Presence of prosthetic heart valve: Secondary | ICD-10-CM

## 2010-09-21 NOTE — Medication Information (Signed)
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 no    Recent/future dental: no  Any missed doses?: no       Is patient compliant with meds? yes       Allergies: No Known Drug Allergies  Anticoagulation Management History:      The patient is taking warfarin and comes in today for a routine follow up visit.  Negative risk factors for bleeding include an age less than 4 years old.  The bleeding index is 'low risk'.  Positive CHADS2 values include History of HTN.  Negative CHADS2 values include Age > 28 years old.  The start date was 07/31/2004.  Anticoagulation responsible provider: Eden Emms MD, Theron Arista.  INR POC: 3.4.  Cuvette Lot#: [id number].  Exp: 08/2011.    Anticoagulation Management Assessment/Plan:      The patient's current anticoagulation dose is Coumadin 5 mg tabs: Take as directed by coumadin clinic..  The target INR is 2.0-3.0.  The next INR is due 09/28/2010.  Anticoagulation instructions were given to patient.  Results were reviewed/authorized by Bethena Midget, RN, BSN.  He was notified by Margot Chimes PharmD Candidate.         Prior Anticoagulation Instructions: INR 2.4 Continue 5mg s everyday except 7.5mg s on Mondays, Wednesdays and Fridays. Recheck in 4 weeks.   Current Anticoagulation Instructions: INR 3.4  Skip tomorrows dose and then resume your normal schedule of 1 tablet everyday except on Mondays, Wednesdays,  and Fridays when you take 1 1/2 tablets.  Try to get your normal amount of greens for the next 2 weeks.  Recheck INR in 2 weeks.

## 2010-09-27 ENCOUNTER — Other Ambulatory Visit: Payer: Self-pay | Admitting: Cardiovascular Disease

## 2010-09-27 DIAGNOSIS — I712 Thoracic aortic aneurysm, without rupture: Secondary | ICD-10-CM

## 2010-09-28 ENCOUNTER — Encounter (INDEPENDENT_AMBULATORY_CARE_PROVIDER_SITE_OTHER): Payer: BC Managed Care – PPO

## 2010-09-28 ENCOUNTER — Encounter: Payer: Self-pay | Admitting: Cardiology

## 2010-09-28 DIAGNOSIS — Z7901 Long term (current) use of anticoagulants: Secondary | ICD-10-CM

## 2010-09-28 DIAGNOSIS — I359 Nonrheumatic aortic valve disorder, unspecified: Secondary | ICD-10-CM

## 2010-10-06 ENCOUNTER — Other Ambulatory Visit (HOSPITAL_COMMUNITY): Payer: Self-pay

## 2010-10-06 NOTE — Medication Information (Signed)
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 no    Recent/future dental: no  Any missed doses?: no       Is patient compliant with meds? yes       Allergies: No Known Drug Allergies  Anticoagulation Management History:      The patient is taking warfarin and comes in today for a routine follow up visit.  Negative risk factors for bleeding include an age less than 63 years old.  The bleeding index is 'low risk'.  Positive CHADS2 values include History of HTN.  Negative CHADS2 values include Age > 80 years old.  The start date was 07/31/2004.  Anticoagulation responsible provider: Shirlee Latch MD, Keishawn Darsey.  Cuvette Lot#: [id number].  Exp: 08/2011.    Anticoagulation Management Assessment/Plan:      The patient's current anticoagulation dose is Coumadin 5 mg tabs: Take as directed by coumadin clinic..  The target INR is 2.0-3.0.  The next INR is due 10/12/2010.  Anticoagulation instructions were given to patient.  Results were reviewed/authorized by Weston Brass, PharmD.  He was notified by Margot Chimes PharmD Candidate.         Prior Anticoagulation Instructions: INR 3.4  Skip tomorrows dose and then resume your normal schedule of 1 tablet everyday except on Mondays, Wednesdays, and Fridays when you take 1 1/2 tablets.  Try to get your normal amount of greens for the next 2 weeks.  Recheck INR in 2 weeks.    Current Anticoagulation Instructions: INR 3.4  Skip todays dose and then start your new schedule of 1 tablet daily except on Mondays and Fridays when you take 1 1/2  tablets.  Recheck INR in 2 weeks.

## 2010-10-10 ENCOUNTER — Other Ambulatory Visit (HOSPITAL_COMMUNITY): Payer: Self-pay

## 2010-10-12 ENCOUNTER — Ambulatory Visit
Admission: RE | Admit: 2010-10-12 | Discharge: 2010-10-12 | Disposition: A | Discharge status: Home / Self Care | Payer: BC Managed Care – PPO | Source: Ambulatory Visit | Attending: Cardiovascular Disease | Admitting: Cardiovascular Disease

## 2010-10-12 ENCOUNTER — Encounter: Payer: Self-pay | Admitting: Cardiology

## 2010-10-12 ENCOUNTER — Encounter (INDEPENDENT_AMBULATORY_CARE_PROVIDER_SITE_OTHER): Payer: BC Managed Care – PPO

## 2010-10-12 ENCOUNTER — Ambulatory Visit (HOSPITAL_COMMUNITY): Payer: BC Managed Care – PPO | Attending: Cardiovascular Disease

## 2010-10-12 ENCOUNTER — Ambulatory Visit (INDEPENDENT_AMBULATORY_CARE_PROVIDER_SITE_OTHER)
Admission: RE | Admit: 2010-10-12 | Discharge: 2010-10-12 | Disposition: A | Discharge status: Home / Self Care | Payer: BC Managed Care – PPO | Source: Ambulatory Visit | Attending: Cardiovascular Disease | Admitting: Cardiovascular Disease

## 2010-10-12 DIAGNOSIS — I7103 Dissection of thoracoabdominal aorta: Secondary | ICD-10-CM | POA: Insufficient documentation

## 2010-10-12 DIAGNOSIS — Z954 Presence of other heart-valve replacement: Secondary | ICD-10-CM | POA: Insufficient documentation

## 2010-10-12 DIAGNOSIS — I359 Nonrheumatic aortic valve disorder, unspecified: Secondary | ICD-10-CM

## 2010-10-12 DIAGNOSIS — I712 Thoracic aortic aneurysm, without rupture, unspecified: Secondary | ICD-10-CM | POA: Insufficient documentation

## 2010-10-12 DIAGNOSIS — I519 Heart disease, unspecified: Secondary | ICD-10-CM | POA: Insufficient documentation

## 2010-10-12 DIAGNOSIS — I10 Essential (primary) hypertension: Secondary | ICD-10-CM | POA: Insufficient documentation

## 2010-10-12 DIAGNOSIS — Z7901 Long term (current) use of anticoagulants: Secondary | ICD-10-CM

## 2010-10-12 DIAGNOSIS — I713 Abdominal aortic aneurysm, ruptured: Secondary | ICD-10-CM

## 2010-10-12 DIAGNOSIS — E785 Hyperlipidemia, unspecified: Secondary | ICD-10-CM | POA: Insufficient documentation

## 2010-10-12 MED ORDER — IOHEXOL 350 MG/ML SOLN
99.0000 mL | Freq: Once | INTRAVENOUS | Status: AC | PRN
  Administered 2010-10-12: 99 mL via INTRAVENOUS

## 2010-10-14 ENCOUNTER — Ambulatory Visit (INDEPENDENT_AMBULATORY_CARE_PROVIDER_SITE_OTHER): Payer: BC Managed Care – PPO | Admitting: Cardiovascular Disease

## 2010-10-14 ENCOUNTER — Encounter: Payer: Self-pay | Admitting: Cardiovascular Disease

## 2010-10-14 DIAGNOSIS — R0989 Other specified symptoms and signs involving the circulatory and respiratory systems: Secondary | ICD-10-CM

## 2010-10-14 DIAGNOSIS — I359 Nonrheumatic aortic valve disorder, unspecified: Secondary | ICD-10-CM

## 2010-10-14 DIAGNOSIS — I712 Thoracic aortic aneurysm, without rupture: Secondary | ICD-10-CM

## 2010-10-17 ENCOUNTER — Other Ambulatory Visit: Payer: Self-pay | Admitting: Cardiovascular Disease

## 2010-10-17 DIAGNOSIS — R0989 Other specified symptoms and signs involving the circulatory and respiratory systems: Secondary | ICD-10-CM

## 2010-10-18 NOTE — Medication Information (Signed)
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 no    Recent/future dental: no  Any missed doses?: no       Is patient compliant with meds? yes       Allergies: No Known Drug Allergies  Anticoagulation Management History:      The patient is taking warfarin and comes in today for a routine follow up visit.  Negative risk factors for bleeding include an age less than 101 years old.  The bleeding index is 'low risk'.  Positive CHADS2 values include History of HTN.  Negative CHADS2 values include Age > 66 years old.  The start date was 07/31/2004.  Anticoagulation responsible provider: Riley Kill MD, Maisie Fus.  INR POC: 2.8.  Cuvette Lot#: [id number].  Exp: 09/2011.    Anticoagulation Management Assessment/Plan:      The patient's current anticoagulation dose is Coumadin 5 mg tabs: Take as directed by coumadin clinic..  The target INR is 2.0-3.0.  The next INR is due 11/02/2010.  Anticoagulation instructions were given to patient.  Results were reviewed/authorized by Bethena Midget, RN, BSN.  He was notified by Bethena Midget, RN, BSN.         Prior Anticoagulation Instructions: INR 3.4  Skip todays dose and then start your new schedule of 1 tablet daily except on Mondays and Fridays when you take 1 1/2 tablets.  Recheck INR in 2 weeks.    Current Anticoagulation Instructions: INR 2.8 Continue 5mg  daily except 7.5mg s on Mondays and Fridays. Recheck in 3 weeks.

## 2010-10-18 NOTE — Assessment & Plan Note (Signed)
Summary: f1m/wpa/tmj   Visit Type:  Follow-up Primary Provider:  Dr. Jonny Ruiz  CC:  pt has no complaints today.  History of Present Illness: 55 yo AAM with history of HTN, hyperlipidemia and ascending aortic aneursym dissection with repair at [facility] in 2003 with St. Jude aortic valve prosthesis on chronic coumadin therapy here today to establish care with me. He was last seen by cardiology in December of 2008 by Dr. Diona Browner. His surgery was at [facility] in 2003. He has had a residual thoracic and abdominal aortic dissection since then. Imaging showed large dissection extending from ascending aorta down into the abdominal aorta and iliacs. The false lumen in the descending thoracic aortic aneurysm is larger than the true lumen. There is some involvement of the arch vessels and renal arteries. He was also found to have a right lung nodule and lesions in the liver and kidneys that were hard to characterize. Most recent CTA this month.  Recent echo this month with normal LV function, normal functioning AVR. The descending thoracic aorta has enlarged in size from 5.2 x 5.3 cm to 5.5 x 5.5 cm. He feels great. No chest pain, SOB, palpitations, near syncope, syncope. Tolerating all meds.    Current Medications (verified): 1)  Coumadin 5 Mg Tabs (Warfarin Sodium) .... Take As Directed By Coumadin Clinic. 2)  Toprol Xl 200 Mg Tb24 (Metoprolol Succinate) .... Take 1 Tablet By Mouth Once A Day 3)  Crestor 20 Mg  Tabs (Rosuvastatin Calcium) .Marland Kitchen.. 1 By Mouth Once Daily 4)  Daily Multi   Tabs (Multiple Vitamins-Minerals) .Marland Kitchen.. 1 By Mouth Qd 5)  Ferrous Sulfate 325 (65 Fe) Mg  Tbec (Ferrous Sulfate) .Marland Kitchen.. 1 By Mouth Qd 6)  Losartan Potassium 50 Mg Tabs (Losartan Potassium) .Marland Kitchen.. 1 By Mouth Once Daily 7)  Fenofibrate 160 Mg Tabs (Fenofibrate) .Marland Kitchen.. 1po Once Daily  Allergies (verified): 1)  ! Celebrex  Past History:  Past Medical History: Reviewed history from 04/15/2010 and no changes required. Coronary artery disease-?  no records to support prior cath Hyperlipidemia Hypertension Obesity Internal Hemorrhoids H/O R Shoulder Impingement H/O Thoracic Aortic Aneurysm H/O Pericardial Effusion H/O Abnornal Thyroid PAnel, having received amiodone Coumadin Therapy Low back pain Anemia-iron deficiency  Social History: Reviewed history from 04/15/2010 and no changes required. 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    Vital Signs:  Patient profile:   55 year old male Height:      68 inches Weight:      221.75 pounds BMI:     33.84 Pulse rate:   58 / minute Resp:     18 per minute BP sitting:   128 / 80  (left arm) Cuff size:   regular  Vitals Entered By: Celestia Khat, CMA (October 14, 2010 9:01 AM)  Physical Exam  General:  General: Well developed, well nourished, NAD HEENT: OP clear, mucus membranes moist SKIN: warm, dry Neuro: No focal deficits Musculoskeletal: Muscle strength 5/5 all ext Psychiatric: Mood and affect normal Neck: No JVD, bilateral  carotid bruits, no  thyromegaly, no lymphadenopathy. Lungs:Clear bilaterally, no wheezes, rhonci, crackles CV: RRR no murmurs, gallops rubs Abdomen: soft, NT, ND, BS present Extremities: No edema, pulses 2+.    CT Scan  Procedure date:  10/12/2010  Findings:      There is a dissection flap involving the ascending aorta, arch, and descending  thoracic aorta.  There has been previous aortic root repair and aortic valve replacement. Residual dissection flap is still evident as before, without change from previous exam.  The dissection flap involves the origin of the right common carotid artery.  The  dissection flap extends into the left subclavian artery and left vertebral artery.  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 The proximal arch measures 4 cm maximal transverse diameter.  Proximal descending thoracic aorta measures 5.5 x 5.5 cm in diameter  (previously 5.2 x 5.3 cm).  Distal descending thoracic aorta tapers to a diameter of 3 cm at the diaphragm.  There is patency of the true and false lumens as before.  No pleural or pericardial effusion.  Stable 5.7 mm subpleural nodule in the superior segment right lower lobe image 55/5.  Stable linear scarring or atelectasis in the posteromedial left lower lobe.  Sternotomy wires are again evident.  There is extension of the aortic dissection through the abdominal aorta.  Both the true and false lumens remain perfused. The dissection flap extends into the right renal artery as before. Dissection flap extends through the right common iliac artery and on the left through the external iliac artery as before. Aortic diameter stable.  Unremarkable arterial phase evaluation of liver, spleen, adrenal glands, gallbladder.  Focal areas of parenchymal loss in both kidneys, left worse than right, stable since previous exam. Unremarkable pancreas.  Small bowel decompressed.  Normal appendix. Colon is nondistended.  Urinary bladder incompletely distended.  No ascites.  No free air.  Superior endplate compression deformities of L1 and L2 are stable.  No abdominal or pelvic adenopathy.   Echocardiogram  Procedure date:  10/12/2010  Findings:      Left ventricle: The  cavity size was normal. Wall thickness was       increased in a pattern of mild LVH. Systolic function was normal.       The estimated ejection fraction was in the range of 60% to 65%.       Wall motion was normal; there were no regional wall motion       abnormalities. Features are consistent with a pseudonormal left       ventricular filling pattern, with concomitant abnormal relaxation       and increased filling pressure (grade 2 diastolic dysfunction).     - Aortic valve: A prosthesis was present and functioning normally.       The prosthesis had a normal range of motion. The sewing ring       appeared normal, had no rocking motion, and showed no evidence of       dehiscence. Trivial regurgitation.     - Mitral valve: Mild regurgitation.     - Left atrium: The atrium was mildly dilated.  Impression & Recommendations:  Problem # 1:  THORACIC AORTIC ANEURYSM (ICD-441.2) Stable. Repeat CTA chest/abd/pelvis.   Problem # 2:  CAROTID BRUIT (ICD-785.9) Will get dopplers.   Orders: Carotid Duplex (Carotid Duplex)  Problem # 3:  AORTIC VALVE DISORDERS (ICD-424.1) Valve functioning well. Continue coumadin.  His updated medication list for this problem includes:    Toprol Xl 200 Mg Tb24 (Metoprolol succinate) .Marland Kitchen... Take 1 tablet by mouth once a day    Losartan Potassium 50 Mg Tabs (Losartan potassium) .Marland Kitchen... 1 by mouth once daily  Other Orders: Cardiac CTA (Cardiac CTA)  Patient Instructions: 1)  Your physician recommends that you schedule a follow-up appointment in: 6 months with Dr. Clifton James. 2)  Your physician recommends that you continue on your current medications as directed. Please refer to the Current Medication list given to you today. 3)  Your physician has requested that you have a carotid duplex. This test is an ultrasound of the carotid arteries in your neck. It looks at blood flow through these arteries that supply the brain with blood. Allow one hour for this exam.  There are no restrictions or special instructions. 4)  Your physician has requested that you have a cardiac CTA in 6 months.  Cardiac computed tomography (CT) is a painless test that uses an x-ray machine to take clear, detailed pictures of your heart.  For further information please visit [email or web address]  Please follow instruction sheet as given.  Appended Document: [id number]/[email or web address]    Clinical Lists Changes  Orders: Added new Referral order of CT Scan  (CT Scan) - Signed

## 2010-11-02 ENCOUNTER — Ambulatory Visit (INDEPENDENT_AMBULATORY_CARE_PROVIDER_SITE_OTHER): Payer: BC Managed Care – PPO | Admitting: *Deleted

## 2010-11-02 DIAGNOSIS — Z954 Presence of other heart-valve replacement: Secondary | ICD-10-CM

## 2010-11-02 DIAGNOSIS — I359 Nonrheumatic aortic valve disorder, unspecified: Secondary | ICD-10-CM

## 2010-11-02 DIAGNOSIS — Z952 Presence of prosthetic heart valve: Secondary | ICD-10-CM

## 2010-11-02 DIAGNOSIS — Z7901 Long term (current) use of anticoagulants: Secondary | ICD-10-CM

## 2010-11-02 LAB — POCT INR: INR: 3.1

## 2010-11-02 NOTE — Patient Instructions (Addendum)
Today just take a half of a tablet. Then resume normal dosing schedule of 1 tablet everyday except 1.5 tablets on Monday. Return in 3 weeks.

## 2010-11-15 ENCOUNTER — Other Ambulatory Visit: Payer: Self-pay | Admitting: *Deleted

## 2010-11-15 ENCOUNTER — Encounter (INDEPENDENT_AMBULATORY_CARE_PROVIDER_SITE_OTHER): Payer: BC Managed Care – PPO | Admitting: *Deleted

## 2010-11-15 DIAGNOSIS — R0989 Other specified symptoms and signs involving the circulatory and respiratory systems: Secondary | ICD-10-CM

## 2010-11-25 ENCOUNTER — Ambulatory Visit (INDEPENDENT_AMBULATORY_CARE_PROVIDER_SITE_OTHER): Payer: BC Managed Care – PPO | Admitting: *Deleted

## 2010-11-25 DIAGNOSIS — Z952 Presence of prosthetic heart valve: Secondary | ICD-10-CM

## 2010-11-25 DIAGNOSIS — I359 Nonrheumatic aortic valve disorder, unspecified: Secondary | ICD-10-CM

## 2010-11-25 LAB — POCT INR: INR: 2.4

## 2010-12-13 NOTE — Assessment & Plan Note (Signed)
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  This patient had an aortic dissection  treated at [facility] in 2004, which included an aortic valve  replacement and a short interposition graft in his aortic arch.  He is  followed at [facility] for a few years until he moved here 4 years ago.  It is  unclear whether he has had follow-up of his aortic dissection since that  time.  I was unable to find any CT scans.  A CT of the chest and abdomen  were performed today which I reviewed in detail.  He does have evidence  of the previous dissection which extends from the aortic arch down to  both common iliac arteries.  There is no aneurysmal dilatation per se of  the left common iliac or right common iliac arteries or the infrarenal  aorta.  He does have good flow feeding all of his visceral branches as  well as his legs.  He does have aneurysm of the proximal descending  thoracic aorta at 5.4 cm.  This may well be unchanged from his  postoperative studies.   PHYSICAL EXAMINATION:  On examination today blood pressure 109/76, heart  rate 64, respirations 14, temperature 98.4.  Carotid pulses 3+ no  audible bruits.  Neurologic:  Normal.  No palpable adenopathy in neck.  Chest:  Clear to auscultation.  Cardiovascular:  Regular rhythm no  murmurs.  Abdomen:  Soft, nontender with no masses.  He has 3+ femoral  pulses bilaterally.   I think he should be followed or at least evaluated by a cardiothoracic  surgeon regarding his previous aortic dissection as to whether it is  stable or enlarging.  I will leave that up to Dr. Oliver Barre to make  that referral.  There is also some question about his left kidney on the  CT angiogram as to whether there could be a complex cyst or possible  neoplasm.  This will need to be followed by Dr. Jonny Ruiz for further  evaluation.  There is no indication for any treatment of his infrarenal  abdominal aorta or iliac arteries as the findings are all consistent  with previous aortic dissection.  He will return to see [place] on a p.r.n.  basis.   Austin [name], M.D.  Electronically Signed   JDL/MEDQ  D:  05/18/2009  T:  05/19/2009  Job:  [id number]   cc:   Corwin Levins, MD

## 2010-12-13 NOTE — Assessment & Plan Note (Signed)
[facility]                         GASTROENTEROLOGY OFFICE NOTE   HAJIME, ASFAW                     MRN:          [id number]  DATE:04/02/2007                            DOB:          Jun 27, 1956    MAIN COMPLAINT:  Mr. Vinje is referred by Dr. Jonny Ruiz because of some  mild iron-deficiency anemia with a hemoglobin of 11.1 and a 5% iron  saturation.   Mr. Diloreto denies any underlying or any GI complaints whatsoever  except for intermittent hemorrhoidal bleeding.  He has been chronically  anticoagulated because of previous aortic aneurysm repair and aortic  valve replacement at [facility].  Because of some hemorrhoidal  bleeding in September, 2003, he underwent a colonoscopy at [facility], which was apparently normal.  I have perused his records in  detail and cannot find that report, but it is documented that this was  done.   Mr. Ibsen recently had some dental work done while on Coumadin and  had excessive bleeding, which may have led to some mild anemia.  He had  some periodic abnormal liver function tests, felt secondary to statin  therapy, and recently had hepatitis panel per Dr. Jonny Ruiz, which showed  negative hepatitis B surface antigen and negative hepatitis C antibody.  He currently is on iron replacement therapy, in addition to his  Coumadin, Toprol XL 200 mg a day, aspirin 81 mg a day, and Crestor 20 mg  a day.  The patient did have ultrasound of the abdomen performed on  March 20, 2006, which was entirely normal.   The patient denies any current gastrointestinal symptoms or rectal  bleeding.  His appetite is good, and his weight is stable.  He is having  normal bowel movements.   FAMILY HISTORY:  Noncontributory.   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  He does not smoke and uses ethanol socially.   REVIEW OF SYSTEMS:  Noncontributory at this time.  He denies  current  cardiovascular or pulmonary complaints.  He has been followed up by Dr.  Simona Huh in cardiology, who feels that he is stable from a  cardiovascular standpoint.   PHYSICAL EXAMINATION:  He is a healthy-appearing middle-aged male  appearing his stated age in no acute distress.  He is 5 feet 8-1/2 inches tall and weighs 189 pounds.  Blood pressure is  98/72, pulse 68 and regular.  I could not appreciate stigmata of chronic liver disease.  CHEST:  Clear.  There is a loud click noted over the aortic area with a  mild systolic ejection murmur.  I could not appreciate a diastolic  component.  There is no S3 gallop.  ABDOMEN:  Entirely benign without organomegaly, masses, or tenderness.  Peripheral extremities showed no edema or phlebitis.  MENTAL STATUS:  Clear.  Inspection of the rectum was unremarkable, as was rectal exam.  Stool  was guaiac negative.   ASSESSMENT:  Mr. Degollado has no gastrointestinal symptomatology  whatsoever.  He has mild anemia and is on Coumadin and aspirin.  The  stool  today for occult blood testing was negative.  I suspect some of  his anemia is chronic in nature and may be related to recent oral  surgery.  He said he lost a lot of blood back in April of this year.  As  mentioned above,  he also has had a negative colonoscopy within the last  five years and has no family history of colon cancer or polyps.   RECOMMENDATIONS:  I have asked Mr. Keenum to return stools for  Hemoccult card testing.  Should these be positive, we will proceed with  capsule endoscopy.  Otherwise, I have asked him to continue his iron  therapy and check back with me in three months time or p.r.n. as needed.  He is to continue all of his other medications per Dr. Jonny Ruiz and Dr.  Diona Browner.     Vania Rea. Jarold Motto, MD, Caleen Essex, FAGA  Electronically Signed    DRP/MedQ  DD: 04/02/2007  DT: 04/02/2007  Job #: [id number]   cc:   Corwin Levins, MD  Jonelle Sidle, MD

## 2010-12-13 NOTE — Consult Note (Signed)
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  He  did have a myocardial infarction apparently after being readmitted, but  has been doing well over the last 5-6 years, on chronic Coumadin.  He  has had ultrasounds over the last few years at [facility]  showing a small left common iliac aneurysm and an aortic enlargement of  3.3-3.6 cm which has been stable.  He is referred today for further  evaluation.  He has had no CT scans apparently.  He denies active chest,  abdominal or back symptoms and remains very active.   PAST MEDICAL HISTORY:  1. Hypertension.  2. Hyperlipidemia.  3. Coronary artery disease, previous myocardial infarction.  4. Iron deficiency anemia.  5. History of internal hemorrhoids.  6. Hypothyroidism.  7. Negative for COPD or stroke or diabetes.   PAST SURGICAL HISTORY:  1. Aortic arch replacement and aortic valve replacement 2004.  2. Left knee surgery.   FAMILY HISTORY:  Negative for coronary artery disease and stroke.  Positive for diabetes in grandmother at an elderly age.   SOCIAL HISTORY:  The patient is single, is a priest.  Does not use  tobacco or drink occasional alcohol.  Stopped smoking in 2004 at time of  surgery.   REVIEW OF SYSTEMS:  Negative for any anorexia, weight loss, chest pain,  dyspnea on exertion, PND, orthopnea.  Negative pulmonary symptoms.  No  GI, GU, vascular, neuro, ortho, psychiatric, ENT or clotting problems,  totally negative otherwise.  Please see health history exam.   ALLERGIES:  None known.   MEDICATIONS:  Please see health history exam includes warfarin 5-7.5 mg  a day.   PHYSICAL EXAM:   Vital signs:  Blood pressure 152/95, heart rate 61,  respirations 14.  General:  Healthy-appearing middle-aged male in no  apparent distress, alert and oriented x3.  Neck:  Supple, 3+ carotid  pulses palpable.  No bruits are audible.  Valve sounds are radiating  into the neck bilaterally.  No palpable adenopathy in the neck.  Chest:  Clear to auscultation.  Cardiovascular:  Reveals a crisp aortic valve  sound.  Abdomen:  Soft and obese.  No masses are palpable.  He has 3+  femoral, popliteal, posterior tibial pulses bilaterally.  Both feet are  well-perfused.  No edema.   Duplex scan at [facility] in September 2010 reveals a 2.4 x 2.5  cm left common iliac artery aneurysm.  He also has a 3.6 x 3.3 cm  abdominal aortic aneurysm with possible chronic dissection.   Discussed with [name] to further evaluate this I need a CT  angiogram to look not only at the chest, but also the abdomen, pelvis.  Will schedule that and he will return in about 3 weeks for further  discussion of this.  I do not think this will require any treatment at  this time, but will get a baseline CT angiogram for further evaluation.   Austin [name], M.D.  Electronically Signed   JDL/MEDQ  D:  04/27/2009  T:  04/28/2009  Job:  [id number]   cc:   Corwin Levins, MD

## 2010-12-13 NOTE — Assessment & Plan Note (Signed)
[facility]                            CARDIOLOGY OFFICE NOTE   JOHNLUKE, HAUGEN                     MRN:          [id number]  DATE:07/18/2007                            DOB:          February 02, 1956    PRIMARY CARE PHYSICIAN:  Dr. Oliver Barre.   REASON FOR VISIT:  Cardiac followup.   HISTORY OF PRESENT ILLNESS:  I saw Mr. Monteforte back in August of last  year.  His history is detailed in my previous note.  In the interim, he  states he was diagnosed with anemia, felt to be due to gastritis induced  by aspirin use.  He is now on pantoprazole and iron supplements.  He  does continue on Coumadin with his prosthetic aortic valve, and has been  switched recently to Crestor, having developed liver abnormalities on  Lipitor.  Symptomatically, he is quite stable without any exertional  angina or breathlessness.  His electrocardiogram today shows sinus  rhythm with a right bundle branch block pattern and lateral Q waves.  He  has nonspecific ST-T wave changes that have been noted in the past.  His  followup echocardiogram done back this past August demonstrated a stable  left ventricular ejection fraction of 60-65%.  He had a stable aortic  prosthesis with no significant perivalvular regurgitation and a mean  gradient of only 11 mmHg.  His aortic root size was actually described  in the normal range at 37 mm.  He also had an abdominal ultrasound  revealing evidence of a chronic aortic dissection, extending the length  of the abdominal aorta into the common iliac.  The aorta size was larger  than the previous study, although only 3.1x3.3 cm.  Proximal common  iliac arteries were dilated, right greater than left.  He has not had  any abdominal discomfort or claudication symptoms.  He does have known  renal insufficiency.  He asked today about perhaps stopping aspirin  since this has led to some difficulties with gastritis.  We talked about  this some  today.   ALLERGIES:  No known drug allergies.   PRESENT MEDICATIONS:  1. Coumadin as directed by the Coumadin Clinic.  2. Toprol XL 200 mg p.o. daily.  3. Aspirin 81 mg p.o. daily.  4. Multivitamin daily.  5. Crestor 20 mg p.o. daily.  6. Iron supplements.  7. Pantoprazole 40 mg p.o. daily.   REVIEW OF SYSTEMS:  As described in the history of present illness.   EXAMINATION:  Blood pressure is 120/80, heart rate is 62, weight 198  pounds.  The patient is comfortable and in no acute distress.  HEENT:  Conjunctivae and lids normal.  Oropharynx clear.  NECK:  Supple.  No elevated jugular venous pressure.  No loud bruits.  No thyromegaly was noted.  LUNGS:  Clear without labored breathing at rest.  CARDIAC:  Regular rate and rhythm with a crisp valve click.  Soft  systolic murmur.  No diastolic murmur.  No S3 gallop or pericardial rub.  ABDOMEN:  Soft and nontender.  Normoactive bowel sounds.  No loud bruit.  SKIN:  Warm  and dry.  EXTREMITIES:  No peripheral edema.  Distal pulses are 2+.  MUSCULOSKELETAL:  No kyphosis noted.  NEURO/PSYCH:  The patient is alert and oriented x3.  Affect is normal.   IMPRESSION/RECOMMENDATIONS:  1. History of type I aortic aneurysm with dissection requiring      emergent repair with concurrent placement of a #23 St. Jude aortic      prosthesis.  Based on available information, it is not entirely      clear that he had any major obstructive cardiovascular disease, and      he is not relating any symptoms at this time.  His echocardiogram      was stable, including the size of the aortic root.  He does have      evidence of residual dissection of the aorta involving the abdomen,      although he is not having any symptoms and his blood pressure is      quite well controlled.  As he has had difficulty with gastritis and      anemia, I felt it to be due to aspirin.  I felt that it would be      reasonable for him to hold off on aspirin and continue  Coumadin      alone.  I would otherwise plan to see him back in 1 year's time      with a followup echocardiogram.  2. Hyperlipidemia, now on Crestor.  This is being followed by Dr.      Jonny Ruiz.     Jonelle Sidle, MD  Electronically Signed    SGM/MedQ  DD: 07/18/2007  DT: 07/19/2007  Job #: [id number]   cc:   Corwin Levins, MD

## 2010-12-16 NOTE — Procedures (Signed)
[facility]                            ABDOMINAL ULTRASOUND REPORT   Austin [name], HOEG                     MRN:          [id number]  DATE:03/20/2006                            DOB:          01/17/1956    ACCESSION NUMBER:  [id number].   READING PHYSICIAN:  Vania Rea. Jarold Motto, MD, Clementeen Graham, FACP.   PROCEDURE:  Multiplanar abdominal ultrasound imaging was performed in the  upright, supine, right and left lateral decubitus positions.   RESULTS:  Abdominal aorta:  This patient has an aortic dissection with a  size upper limits of normal, maximal diameter 2.9 cm.  This apparently has  been stable for the last four years.  The IVC is patent.   The pancreas appears normal throughout the head, body and tail without  evidence of ductal dilatation, pancreatic masses, or peripancreatic  inflammation.   Gallbladder is well distended, wall thickness 2.2 mm, with no  pericholecystic fluid or intraluminal echogenic foci to suggest gallstone  disease.   The common bile duct measures 4.4 mm in maximal diameter without evidence of  intraluminal foci.   The liver appears normal without evidence of parenchymal lesion, ductal  dilatation or vascular abnormality.   Kidneys are normal in appearance.  Right 10.9 cm, left 8.5 cm.   Spleen is normal in size, measuring 10 cm without parenchymal lesion.   IMPRESSION:  This is a normal upper abdominal ultrasound exam without  evidence of cholelithiasis or biliary ductal dilatation.  The pancreas and  liver are well imaged and appear normal.  The patient does have a stable  aortic dissection which is noted.                                   Vania Rea. Jarold Motto, MD, Clementeen Graham, [place]   DRP/MedQ  DD:  03/20/2006  DT:  03/21/2006  Job #:  [id number]   cc:   Corwin Levins, MD

## 2010-12-16 NOTE — Assessment & Plan Note (Signed)
[facility]                              CARDIOLOGY OFFICE NOTE   [name], Austin                     MRN:          [id number]  DATE:03/27/2006                            DOB:          06/16/1956    REASON FOR VISIT:  Followup for aortic aneurysm repair and aortic valve  replacement.   HISTORY OF PRESENT ILLNESS:  This is my first meeting with Austin [name].  He  is a pleasant 55 year old Austin [name] who continues to work at the [employer].  He is status post presentation in 2003 with a  type 1 aortic aneurysm dissection, subsequently requiring what sounds like a  Bentall procedure with replacement of the aortic valve using a St. Jude  prosthesis.  Based on the only available information, I cannot ascertain  whether he had any coronary artery disease noted at that time, and the  patient tells me that he was never told he had any significant heart  blockages.  He has actually done quite well, although he did have to have,  shortly following his initial operation, a reoperation with evacuation of a  pericardial effusion causing tamponade with a concurrent sternal wound  infection.  He has renal insufficiency with a baseline creatinine of  approximately 1.7.  He reports that he has been feeling quite well, walking  and even doing some light running and remaining compliant with his  medications.  He states that he has been able to lose approximately 60  pounds over the last several months by being very careful of his diet and  exercise.  His electrocardiogram today demonstrates normal sinus bradycardia  at 56 beats per minute with an old right bundle branch block pattern and  left atrial enlargement.  He had a recent followup echocardiogram showing a  left ventricular ejection fraction of 65% without regional wall motion  abnormalities.  The mechanical aortic valve prosthesis was stable without  perivalvular leak and with  a mean transvalvular gradient of 14 mmHg.  I note  that the aortic root size was approximately 43 mm.  He has not had any other  thoracic imaging.   ALLERGIES:  NO KNOWN DRUG ALLERGIES.   PRESENT MEDICATIONS:  1. Coumadin as followed at the Coumadin clinic.  2. Toprol-XL 200 mg p.o. daily.  3. Aspirin 81 mg p.o. daily.  4. Multivitamin 1 p.o. daily.   REVIEW OF SYSTEMS:  As described in the history present illness.  He states  that he has had recent blood work abnormalities including elevated liver  function tests and has been off of Lipitor since the beginning of this  month.  He also apparently had an abdominal ultrasound as ordered by Dr.  Jonny [name] to investigate this further.  He is not complaining of any active  abdominal pain or difficulties with eating or bowel habit changes.   EXAMINATION:  VITAL SIGNS:  Blood pressure today is 126/86, heart rate is  56.  Weight is 182 pounds, down from 243 back in August of last year.  GENERAL:  Patient is comfortable and in no acute  distress.  HEENT:  Conjunctivae and lids normal.  Oropharynx is clear.  NECK:  Supple without elevated jugular venous pressure.  No bruits, no  thyromegaly noted.  LUNGS:  Clear without labored breathing.  CARDIAC EXAM:  Reveals a regular rate and rhythm with a crisp prosthetic  valve click and S2.  There is a soft systolic murmur noted predominately at  the base.  No S3 gallop is noted.  There is no pericardial rub.  ABDOMEN:  Nontender.  EXTREMITIES:  Show no significant pitting edema.   IMPRESSION AND RECOMMENDATIONS:  1. History of type 1 aortic aneurysm with dissection requiring emergent      repair, presumably a Bentall procedure with concurrent replacement of      the aortic valve with a #23 St. Jude prosthesis.  I am not aware of any      underlying obstructive coronary artery disease and he is not having any      active angina relating symptoms, in fact stating that he has been      feeling quite well.   His echocardiogram shows enlargement of the aortic      root, measured at 43 mm, but certainly not be inconsistent with his      repair.  His last ultrasound from August of last year showed an aortic      root diameter of approximately 41 mm with an ascending aortic dimension      of 46 mm.  It is not entirely clear that these measurements were done      in the same planes and, therefore, that there has been any significant      change.  At this point, I would anticipate continuing therapy given his      clinical stability and plan on yearly followup, at least with an      ultrasound next year and perhaps even a noncontrasted magnetic      resonance imaging scan.  Given his renal insufficiency, it would likely      be best to avoid a contrasted CT and, for that matter, even a      gadolinium study.  He will otherwise continue to see Dr. Jonny [name].  2. From the perspective of his lipids, I agree that he should be tightly      controlled.  It is not entirely clear whether the patient's elevated      liver function tests were due to his Lipitor or not as he has only been      recently off of his statin medicine and has not had followup studies as      yet.  It would be useful to know what his baseline lipid status is off      of statin medications and this may help guide future therapy.                                Austin Sidle, MD    SGM/MedQ  DD:  03/27/2006  DT:  03/28/2006  Job #:  [id number]   cc:   Austin Levins, MD

## 2010-12-23 ENCOUNTER — Ambulatory Visit (INDEPENDENT_AMBULATORY_CARE_PROVIDER_SITE_OTHER): Payer: BC Managed Care – PPO | Admitting: *Deleted

## 2010-12-23 DIAGNOSIS — Z952 Presence of prosthetic heart valve: Secondary | ICD-10-CM

## 2010-12-23 DIAGNOSIS — I359 Nonrheumatic aortic valve disorder, unspecified: Secondary | ICD-10-CM

## 2011-01-19 ENCOUNTER — Ambulatory Visit (INDEPENDENT_AMBULATORY_CARE_PROVIDER_SITE_OTHER): Payer: BC Managed Care – PPO | Admitting: *Deleted

## 2011-01-19 DIAGNOSIS — I359 Nonrheumatic aortic valve disorder, unspecified: Secondary | ICD-10-CM

## 2011-01-19 DIAGNOSIS — Z952 Presence of prosthetic heart valve: Secondary | ICD-10-CM

## 2011-01-23 ENCOUNTER — Other Ambulatory Visit: Payer: Self-pay | Admitting: Internal Medicine

## 2011-01-26 ENCOUNTER — Other Ambulatory Visit: Payer: Self-pay | Admitting: Internal Medicine

## 2011-02-02 ENCOUNTER — Other Ambulatory Visit: Payer: Self-pay | Admitting: Internal Medicine

## 2011-02-16 ENCOUNTER — Ambulatory Visit (INDEPENDENT_AMBULATORY_CARE_PROVIDER_SITE_OTHER): Payer: BC Managed Care – PPO | Admitting: *Deleted

## 2011-02-16 DIAGNOSIS — I359 Nonrheumatic aortic valve disorder, unspecified: Secondary | ICD-10-CM

## 2011-02-16 DIAGNOSIS — Z952 Presence of prosthetic heart valve: Secondary | ICD-10-CM

## 2011-02-16 LAB — POCT INR: INR: 3.1

## 2011-02-27 ENCOUNTER — Other Ambulatory Visit: Payer: Self-pay | Admitting: Internal Medicine

## 2011-03-16 ENCOUNTER — Ambulatory Visit (INDEPENDENT_AMBULATORY_CARE_PROVIDER_SITE_OTHER): Payer: BC Managed Care – PPO | Admitting: *Deleted

## 2011-03-16 DIAGNOSIS — I359 Nonrheumatic aortic valve disorder, unspecified: Secondary | ICD-10-CM

## 2011-03-16 DIAGNOSIS — Z952 Presence of prosthetic heart valve: Secondary | ICD-10-CM

## 2011-04-06 ENCOUNTER — Ambulatory Visit (INDEPENDENT_AMBULATORY_CARE_PROVIDER_SITE_OTHER): Payer: BC Managed Care – PPO | Admitting: *Deleted

## 2011-04-06 DIAGNOSIS — I359 Nonrheumatic aortic valve disorder, unspecified: Secondary | ICD-10-CM

## 2011-04-06 DIAGNOSIS — Z952 Presence of prosthetic heart valve: Secondary | ICD-10-CM

## 2011-04-25 ENCOUNTER — Other Ambulatory Visit: Payer: Self-pay | Admitting: Internal Medicine

## 2011-05-03 ENCOUNTER — Other Ambulatory Visit: Payer: Self-pay | Admitting: Internal Medicine

## 2011-05-04 ENCOUNTER — Encounter: Payer: Self-pay | Admitting: Cardiovascular Disease

## 2011-05-05 ENCOUNTER — Ambulatory Visit (INDEPENDENT_AMBULATORY_CARE_PROVIDER_SITE_OTHER): Payer: BC Managed Care – PPO | Admitting: *Deleted

## 2011-05-05 ENCOUNTER — Encounter: Payer: Self-pay | Admitting: Cardiovascular Disease

## 2011-05-05 ENCOUNTER — Ambulatory Visit (INDEPENDENT_AMBULATORY_CARE_PROVIDER_SITE_OTHER): Payer: BC Managed Care – PPO | Admitting: Cardiovascular Disease

## 2011-05-05 ENCOUNTER — Other Ambulatory Visit: Payer: Self-pay | Admitting: Internal Medicine

## 2011-05-05 VITALS — BP 123/80 | HR 59 | Resp 18 | Ht 68.0 in | Wt 226.1 lb

## 2011-05-05 DIAGNOSIS — Z952 Presence of prosthetic heart valve: Secondary | ICD-10-CM

## 2011-05-05 DIAGNOSIS — I712 Thoracic aortic aneurysm, without rupture: Secondary | ICD-10-CM

## 2011-05-05 DIAGNOSIS — I359 Nonrheumatic aortic valve disorder, unspecified: Secondary | ICD-10-CM

## 2011-05-05 LAB — POCT INR: INR: 3

## 2011-05-05 NOTE — Assessment & Plan Note (Signed)
Will repeat CTA chest and abdomen in March 2013. Asymptomatic.

## 2011-05-05 NOTE — Progress Notes (Signed)
History of Present Illness:55 yo AAM with history of HTN, hyperlipidemia and ascending aortic aneursym dissection with repair at [facility] in 2003 with St. Jude aortic valve prosthesis on chronic coumadin therapy here today for cardiac follow up. His surgery was at [facility] in 2003. He has had a residual thoracic and abdominal aortic dissection since then. Imaging showed large dissection extending from ascending aorta down into the abdominal aorta and iliacs. The false lumen in the descending thoracic aortic aneurysm is larger than the true lumen. There is some involvement of the arch vessels and renal arteries. He was also found to have a right lung nodule and lesions in the liver and kidneys that were hard to characterize. Echo March 2012 with normal LV function, normal functioning AVR. The descending thoracic aorta has enlarged in size from 5.2 x 5.3 cm to 5.5 x 5.5 cm.   He feels great. No chest pain, SOB, palpitations, near syncope, syncope. Tolerating all meds.   His primary care is Dr. Oliver Barre.   Past Medical History  Diagnosis Date  . Coronary artery disease   . Hypertension   . Hyperlipidemia   . Obesity   . Internal hemorrhoid   . Pericardial effusion   . Thyroid disease   . Back pain   . Anemia     iron def  . Thoracic aortic aneurysm     Past Surgical History  Procedure Date  . Impingment     right shoulder  . Abdominal aortic aneurysm repair   . Aortic valve replacement   . Knee arthroscopy     left  . Wisdom tooth extraction     Current Outpatient Prescriptions  Medication Sig Dispense Refill  . CRESTOR 20 MG tablet TAKE 1 TABLET DAILY  90 tablet  2  . fenofibrate 160 MG tablet TAKE 1 TABLET DAILY  90 tablet  0  . ferrous sulfate 325 (65 FE) MG tablet Take 325 mg by mouth daily with breakfast.        . losartan (COZAAR) 50 MG tablet TAKE 1 TABLET ONCE DAILY  30 tablet  0  . metoprolol (TOPROL-XL) 200 MG 24 hr tablet TAKE 1 TABLET DAILY  30 tablet  0  . Multiple Vitamin  (MULTIVITAMIN) tablet Take 1 tablet by mouth daily.        Marland Kitchen warfarin (COUMADIN) 5 MG tablet Take by mouth as directed.          Allergies  Allergen Reactions  . Celecoxib     History   Social History  . Marital Status: Single    Spouse Name: N/A    Number of Children: N/A  . Years of Education: N/A   Occupational History  . Not on file.   Social History Main Topics  . Smoking status: Former Games developer  . Smokeless tobacco: Not on file  . Alcohol Use: Yes  . Drug Use: No  . Sexually Active:    Other Topics Concern  . Not on file   Social History Narrative  . No narrative on file    Family History  Problem Relation Age of Onset  . Cancer Father     Liver?    Review of Systems:  As stated in the HPI and otherwise negative.   BP 123/80  Pulse 59  Resp 18  Ht 5\' 8"  (1.727 m)  Wt 226 lb 1.9 oz (102.567 kg)  BMI 34.38 kg/m2  Physical Examination: General: Well developed, well nourished, NAD HEENT: OP clear, mucus membranes  moist SKIN: warm, dry. No rashes. Neuro: No focal deficits Musculoskeletal: Muscle strength 5/5 all ext Psychiatric: Mood and affect normal Neck: No JVD, no carotid bruits, no thyromegaly, no lymphadenopathy. Lungs:Clear bilaterally, no wheezes, rhonci, crackles Cardiovascular: Regular rate and rhythm. No murmurs, gallops or rubs. Abdomen:Soft. Bowel sounds present. Non-tender.  Extremities: No lower extremity edema. Pulses are 2 + in the bilateral DP/PT.  EKG:NSR, rate 60 bpm. Incomplete RBBB.

## 2011-05-05 NOTE — Patient Instructions (Addendum)
Your physician wants you to follow-up in: 6 months.  You will receive a reminder letter in the mail two months in advance. If you don't receive a letter, please call our office to schedule the follow-up appointment.  Non-Cardiac CT Angiography (CTA), is a special type of CT scan that uses a computer to produce multi-dimensional views of major blood vessels throughout the body. In CT angiography, a contrast material is injected through an IV to help visualize the blood vessels. To be done in 6 months prior to office visit with Dr. Lisette Grinder will need blood work done before the CT--BMP

## 2011-05-05 NOTE — Assessment & Plan Note (Signed)
Continue coumadin. No changes.

## 2011-05-14 ENCOUNTER — Encounter: Payer: Self-pay | Admitting: Internal Medicine

## 2011-05-14 DIAGNOSIS — Z0000 Encounter for general adult medical examination without abnormal findings: Secondary | ICD-10-CM | POA: Insufficient documentation

## 2011-05-14 DIAGNOSIS — Z0001 Encounter for general adult medical examination with abnormal findings: Secondary | ICD-10-CM | POA: Insufficient documentation

## 2011-05-15 ENCOUNTER — Encounter: Payer: Self-pay | Admitting: Internal Medicine

## 2011-05-15 ENCOUNTER — Other Ambulatory Visit (INDEPENDENT_AMBULATORY_CARE_PROVIDER_SITE_OTHER): Payer: BC Managed Care – PPO

## 2011-05-15 ENCOUNTER — Ambulatory Visit (INDEPENDENT_AMBULATORY_CARE_PROVIDER_SITE_OTHER): Payer: BC Managed Care – PPO | Admitting: Internal Medicine

## 2011-05-15 ENCOUNTER — Other Ambulatory Visit: Payer: Self-pay | Admitting: Internal Medicine

## 2011-05-15 VITALS — BP 114/80 | HR 64 | Temp 98.4°F | Ht 68.0 in | Wt 229.0 lb

## 2011-05-15 DIAGNOSIS — Z0000 Encounter for general adult medical examination without abnormal findings: Secondary | ICD-10-CM

## 2011-05-15 DIAGNOSIS — L0390 Cellulitis, unspecified: Secondary | ICD-10-CM

## 2011-05-15 DIAGNOSIS — Z23 Encounter for immunization: Secondary | ICD-10-CM

## 2011-05-15 DIAGNOSIS — L0291 Cutaneous abscess, unspecified: Secondary | ICD-10-CM

## 2011-05-15 LAB — HEPATIC FUNCTION PANEL
AST: 34 U/L (ref 0–37)
Albumin: 4.6 g/dL (ref 3.5–5.2)

## 2011-05-15 LAB — BASIC METABOLIC PANEL
BUN: 30 mg/dL — ABNORMAL HIGH (ref 6–23)
Calcium: 9.4 mg/dL (ref 8.4–10.5)
GFR: 50.44 mL/min — ABNORMAL LOW (ref >60.00)
Glucose, Bld: 87 mg/dL (ref 70–99)

## 2011-05-15 LAB — LIPID PANEL
Cholesterol: 208 mg/dL — ABNORMAL HIGH (ref 0–200)
HDL: 44 mg/dL (ref >39.00)
Triglycerides: 281 mg/dL — ABNORMAL HIGH (ref 0.0–149.0)

## 2011-05-15 LAB — TSH: TSH: 1.42 u[IU]/mL (ref 0.35–5.50)

## 2011-05-15 LAB — CBC WITH DIFFERENTIAL/PLATELET
Eosinophils Relative: 5.6 % — ABNORMAL HIGH (ref 0.0–5.0)
HCT: 45.9 % (ref 39.0–52.0)
Lymphs Abs: 1.1 10*3/uL (ref 0.7–4.0)
Monocytes Relative: 9.1 % (ref 3.0–12.0)
Platelets: 173 10*3/uL (ref 150.0–400.0)
WBC: 5.2 10*3/uL (ref 4.5–10.5)

## 2011-05-15 LAB — URINALYSIS, ROUTINE W REFLEX MICROSCOPIC
Ketones, ur: NEGATIVE
Leukocytes, UA: NEGATIVE
Nitrite: NEGATIVE
Specific Gravity, Urine: 1.015 (ref 1.000–1.030)
pH: 7.5 (ref 5.0–8.0)

## 2011-05-15 MED ORDER — DOXYCYCLINE HYCLATE 100 MG PO TABS: 100.0000 mg | ORAL_TABLET | Freq: Two times a day (BID) | ORAL | Status: AC

## 2011-05-15 MED ORDER — FENOFIBRATE 160 MG PO TABS: 160.0000 mg | ORAL_TABLET | Freq: Every day | ORAL | Status: DC

## 2011-05-15 MED ORDER — METOPROLOL SUCCINATE ER 200 MG PO TB24: 200.0000 mg | ORAL_TABLET | Freq: Every day | ORAL | Status: DC

## 2011-05-15 MED ORDER — ROSUVASTATIN CALCIUM 20 MG PO TABS: 20.0000 mg | ORAL_TABLET | Freq: Every day | ORAL | Status: DC

## 2011-05-15 MED ORDER — LOSARTAN POTASSIUM 50 MG PO TABS: 50.0000 mg | ORAL_TABLET | Freq: Every day | ORAL | Status: DC

## 2011-05-15 MED ORDER — WARFARIN SODIUM 5 MG PO TABS: 5.0000 mg | ORAL_TABLET | ORAL | Status: DC

## 2011-05-15 NOTE — Assessment & Plan Note (Addendum)

## 2011-05-15 NOTE — Patient Instructions (Addendum)
You had the flu shot today Please go to LAB in the Basement for the blood and/or urine tests to be done today Please call the phone number [phone] (the PhoneTree System) for results of testing in 2-3 days;  When calling, simply dial the number, and when prompted enter the MRN number above (the Medical Record Number) and the # key, then the message should start. Take all new medications as prescribed - the antibiotic (sent to [facility]) Continue all other medications as before (all refills sent to [facility]) Please return in 1 year for your yearly visit, or sooner if needed, with Lab testing done 3-5 days before

## 2011-05-15 NOTE — Progress Notes (Signed)
Subjective:    Patient ID: Austin [name], male    DOB: 08/01/1955, 55 y.o.   MRN: [id number]  HPI  Here for wellness and f/u;  Overall doing ok;  Pt denies CP, worsening SOB, DOE, wheezing, orthopnea, PND, worsening LE edema, palpitations, dizziness or syncope.  Pt denies neurological change such as new Headache, facial or extremity weakness.  Pt denies polydipsia, polyuria, or low sugar symptoms. Pt states overall good compliance with treatment and medications, good tolerability, and trying to follow lower cholesterol diet.  Pt denies worsening depressive symptoms, suicidal ideation or panic. No fever, wt loss, night sweats, loss of appetite, or other constitutional symptoms.  Pt states good ability with ADL's, low fall risk, home safety reviewed and adequate, no significant changes in hearing or vision, and occasionally active with exercise.  Has seen card last wk.  Has gained several lbs with no signficaint diet or activity change per pt.  Walks dog every day. Past Medical History  Diagnosis Date  . Coronary artery disease   . Hypertension   . Hyperlipidemia   . Obesity   . Internal hemorrhoid   . Pericardial effusion   . Thyroid disease   . Back pain   . Anemia     iron def  . Thoracic aortic aneurysm    Past Surgical History  Procedure Date  . Impingment     right shoulder  . Abdominal aortic aneurysm repair   . Aortic valve replacement   . Knee arthroscopy     left  . Wisdom tooth extraction     reports that he has quit smoking. He does not have any smokeless tobacco history on file. He reports that he drinks alcohol. He reports that he does not use illicit drugs. family history includes Cancer in his father. Allergies  Allergen Reactions  . Celecoxib    Current Outpatient Prescriptions on File Prior to Visit  Medication Sig Dispense Refill  . ferrous sulfate 325 (65 FE) MG tablet Take 325 mg by mouth daily with breakfast.        . Multiple Vitamin (MULTIVITAMIN) tablet  Take 1 tablet by mouth daily.         Review of Systems Review of Systems  Constitutional: Negative for diaphoresis, activity change, appetite change and unexpected weight change.  HENT: Negative for hearing loss, ear pain, facial swelling, mouth sores and neck stiffness.   Eyes: Negative for pain, redness and visual disturbance.  Respiratory: Negative for shortness of breath and wheezing.   Cardiovascular: Negative for chest pain and palpitations.  Gastrointestinal: Negative for diarrhea, blood in stool, abdominal distention and rectal pain.  Genitourinary: Negative for hematuria, flank pain and decreased urine volume.  Musculoskeletal: Negative for myalgias and joint swelling.  Skin: Negative for color change and wound.  Neurological: Negative for syncope and numbness.  Hematological: Negative for adenopathy.  Psychiatric/Behavioral: Negative for hallucinations, self-injury, decreased concentration and agitation.     Objective:   Physical Exam BP 114/80  Pulse 64  Temp(Src) 98.4 F (36.9 C) (Oral)  Ht 5\' 8"  (1.727 m)  Wt 229 lb 0.6 oz (103.892 kg)  BMI 34.83 kg/m2  SpO2 96% Physical Exam  VS noted Constitutional: Pt is oriented to person, place, and time. Appears well-developed and well-nourished.  HENT:  Head: Normocephalic and atraumatic.  Right Ear: External ear normal.  Left Ear: External ear normal.  Nose: Nose normal.  Mouth/Throat: Oropharynx is clear and moist.  Eyes: Conjunctivae and EOM are normal. Pupils are  equal, round, and reactive to light.  Neck: Normal range of motion. Neck supple. No JVD present. No tracheal deviation present.  Cardiovascular: Normal rate, regular rhythm, normal heart sounds and intact distal pulses.   Pulmonary/Chest: Effort normal and breath sounds normal.  Abdominal: Soft. Bowel sounds are normal. There is no tenderness.  Musculoskeletal: Normal range of motion. Exhibits no edema.  Lymphadenopathy:  Has no cervical adenopathy.    Neurological: Pt is alert and oriented to person, place, and time. Pt has normal reflexes. No cranial nerve deficit.  Skin: Skin is warm and dry. No rash noted. except for right inguinal area 1 cm red, tender, induration  - ? Early cellultiits, no hernia noted Psychiatric:  Has  normal mood and affect. Behavior is normal.     Assessment & Plan:

## 2011-05-15 NOTE — Assessment & Plan Note (Signed)
Incidental, mild right inguinal area - for doxy course,  to f/u any worsening symptoms or concerns

## 2011-05-16 ENCOUNTER — Encounter: Payer: Self-pay | Admitting: Internal Medicine

## 2011-05-16 ENCOUNTER — Other Ambulatory Visit: Payer: Self-pay | Admitting: Internal Medicine

## 2011-05-16 DIAGNOSIS — N183 Chronic kidney disease, stage 3 unspecified: Secondary | ICD-10-CM | POA: Insufficient documentation

## 2011-05-16 DIAGNOSIS — N289 Disorder of kidney and ureter, unspecified: Secondary | ICD-10-CM

## 2011-05-16 DIAGNOSIS — N189 Chronic kidney disease, unspecified: Secondary | ICD-10-CM | POA: Insufficient documentation

## 2011-05-16 MED ORDER — ROSUVASTATIN CALCIUM 40 MG PO TABS: 40.0000 mg | ORAL_TABLET | Freq: Every day | ORAL | Status: DC

## 2011-05-19 ENCOUNTER — Ambulatory Visit
Admission: RE | Admit: 2011-05-19 | Discharge: 2011-05-19 | Disposition: A | Discharge status: Home / Self Care | Payer: BC Managed Care – PPO | Source: Ambulatory Visit | Attending: Internal Medicine | Admitting: Internal Medicine

## 2011-05-19 DIAGNOSIS — N289 Disorder of kidney and ureter, unspecified: Secondary | ICD-10-CM

## 2011-06-01 ENCOUNTER — Ambulatory Visit (INDEPENDENT_AMBULATORY_CARE_PROVIDER_SITE_OTHER): Payer: BC Managed Care – PPO | Admitting: *Deleted

## 2011-06-01 DIAGNOSIS — Z952 Presence of prosthetic heart valve: Secondary | ICD-10-CM

## 2011-06-01 DIAGNOSIS — I359 Nonrheumatic aortic valve disorder, unspecified: Secondary | ICD-10-CM

## 2011-07-13 ENCOUNTER — Ambulatory Visit (INDEPENDENT_AMBULATORY_CARE_PROVIDER_SITE_OTHER): Payer: BC Managed Care – PPO | Admitting: *Deleted

## 2011-07-13 DIAGNOSIS — Z952 Presence of prosthetic heart valve: Secondary | ICD-10-CM

## 2011-07-13 DIAGNOSIS — I359 Nonrheumatic aortic valve disorder, unspecified: Secondary | ICD-10-CM

## 2011-07-13 LAB — POCT INR: INR: 3.1

## 2011-08-24 ENCOUNTER — Ambulatory Visit (INDEPENDENT_AMBULATORY_CARE_PROVIDER_SITE_OTHER): Payer: BC Managed Care – PPO

## 2011-08-24 DIAGNOSIS — I359 Nonrheumatic aortic valve disorder, unspecified: Secondary | ICD-10-CM

## 2011-08-24 DIAGNOSIS — Z954 Presence of other heart-valve replacement: Secondary | ICD-10-CM

## 2011-08-24 DIAGNOSIS — Z952 Presence of prosthetic heart valve: Secondary | ICD-10-CM

## 2011-08-24 LAB — POCT INR: INR: 3.5

## 2011-09-22 ENCOUNTER — Other Ambulatory Visit: Payer: BC Managed Care – PPO

## 2011-10-04 ENCOUNTER — Ambulatory Visit (INDEPENDENT_AMBULATORY_CARE_PROVIDER_SITE_OTHER): Payer: BC Managed Care – PPO | Admitting: *Deleted

## 2011-10-04 DIAGNOSIS — I359 Nonrheumatic aortic valve disorder, unspecified: Secondary | ICD-10-CM

## 2011-10-04 DIAGNOSIS — Z954 Presence of other heart-valve replacement: Secondary | ICD-10-CM

## 2011-10-04 DIAGNOSIS — Z952 Presence of prosthetic heart valve: Secondary | ICD-10-CM

## 2011-11-01 ENCOUNTER — Ambulatory Visit (INDEPENDENT_AMBULATORY_CARE_PROVIDER_SITE_OTHER): Payer: BC Managed Care – PPO | Admitting: *Deleted

## 2011-11-01 DIAGNOSIS — Z954 Presence of other heart-valve replacement: Secondary | ICD-10-CM

## 2011-11-01 DIAGNOSIS — I359 Nonrheumatic aortic valve disorder, unspecified: Secondary | ICD-10-CM

## 2011-11-01 DIAGNOSIS — Z952 Presence of prosthetic heart valve: Secondary | ICD-10-CM

## 2011-11-08 ENCOUNTER — Ambulatory Visit (INDEPENDENT_AMBULATORY_CARE_PROVIDER_SITE_OTHER): Payer: BC Managed Care – PPO | Admitting: Cardiovascular Disease

## 2011-11-08 ENCOUNTER — Encounter: Payer: Self-pay | Admitting: Cardiovascular Disease

## 2011-11-08 VITALS — BP 108/71 | HR 62 | Ht 68.0 in | Wt 229.0 lb

## 2011-11-08 DIAGNOSIS — Z952 Presence of prosthetic heart valve: Secondary | ICD-10-CM

## 2011-11-08 DIAGNOSIS — I712 Thoracic aortic aneurysm, without rupture: Secondary | ICD-10-CM

## 2011-11-08 DIAGNOSIS — Z954 Presence of other heart-valve replacement: Secondary | ICD-10-CM

## 2011-11-08 NOTE — Assessment & Plan Note (Addendum)
He is known to have type A aortic dissection with extension from the thoracic aorta through the iliacs involving the right renal artery. This has been stable. Last CTA was February 2012 as he missed appt for repeat CTA March 2013. We have discussed repeating his CTA but he has had worsening of his renal function over the last 6 months. Will hold for now. Could consider MRA but this would be limited by renal function as well. He will let us know when renal workup is complete and we will proceed with CTA at that time.

## 2011-11-08 NOTE — Assessment & Plan Note (Signed)
On chronic coumadin therapy. Stable.

## 2011-11-08 NOTE — Progress Notes (Signed)
History of Present Illness: 56 yo AAM with history of HTN, hyperlipidemia and ascending aortic aneursym dissection with repair at [facility] in 2003 with St. Jude aortic valve prosthesis on chronic coumadin therapy here today for cardiac follow up. His surgery was at [facility] in 2003. He has had a residual thoracic and abdominal aortic dissection since then. Imaging showed large dissection extending from ascending aorta down into the abdominal aorta and iliacs. The false lumen in the descending thoracic aortic aneurysm is larger than the true lumen. There is some involvement of the arch vessels and renal arteries. He was also found to have a right lung nodule and lesions in the liver and kidneys that were hard to characterize. Echo March 2012 with normal LV function, normal functioning AVR. The descending thoracic aorta had enlarged in size from 5.2 x 5.3 cm to 5.5 x 5.5 cm. Plans were in place for repeat CTA in March 2013 but he was called and did not call back to schedule. He is on chronic coumadin for anticoagulation after mechanical AVR. 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   He feels great. No chest pain, SOB, palpitations, near syncope, syncope. No leg pains. Tolerating all meds. He is seeing Nephrology for worsening of his renal function. 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 Last creatinine was 1.79 in march 2013. He is seeing Dr. Lowell Guitar with [facility].   Primary Care Physician: Dr. Oliver Barre  Past Medical History  Diagnosis Date  . Coronary artery disease   . Hypertension   . Hyperlipidemia   . Obesity   . Internal hemorrhoid   . Pericardial effusion   . Thyroid disease   . Back pain   . Anemia     iron def  . Thoracic aortic aneurysm   . Renal insufficiency 05/16/2011    Past Surgical History  Procedure Date  . Impingment     right shoulder  .  Abdominal aortic aneurysm repair   . Aortic valve replacement   . Knee arthroscopy     left  . Wisdom tooth extraction     Current Outpatient Prescriptions  Medication Sig Dispense Refill  . fenofibrate 160 MG tablet Take 1 tablet (160 mg total) by mouth daily.  90 tablet  3  . ferrous sulfate 325 (65 FE) MG tablet Take 325 mg by mouth daily with breakfast.        . losartan (COZAAR) 50 MG tablet Take 1 tablet (50 mg total) by mouth daily.  90 tablet  3  . metoprolol (TOPROL-XL) 200 MG 24 hr tablet Take 1 tablet (200 mg total) by mouth daily.  90 tablet  3  . Multiple Vitamin (MULTIVITAMIN) tablet Take 1 tablet by mouth daily.        . rosuvastatin (CRESTOR) 40 MG tablet Take 1 tablet (40 mg total) by mouth daily.  90 tablet  3  . warfarin (COUMADIN) 5 MG tablet Take 1 tablet (5 mg total) by mouth as directed.  100 tablet  3    Allergies  Allergen Reactions  . Celecoxib     History   Social History  . Marital Status: Single    Spouse Name: N/A    Number of Children: N/A  . Years of Education: N/A   Occupational History  . Not on file.   Social History Main Topics  .  Smoking status: Former Games developer  . Smokeless tobacco: Not on file  . Alcohol Use: Yes  . Drug Use: No  . Sexually Active:    Other Topics Concern  . Not on file   Social History Narrative  . No narrative on file    Family History  Problem Relation Age of Onset  . Cancer Father     Liver?    Review of Systems:  As stated in the HPI and otherwise negative.   BP 108/71  Pulse 62  Ht 5\' 8"  (1.727 m)  Wt 229 lb (103.874 kg)  BMI 34.82 kg/m2  Physical Examination: General: Well developed, well nourished, NAD HEENT: OP clear, mucus membranes moist SKIN: warm, dry. No rashes. Neuro: No focal deficits Musculoskeletal: Muscle strength 5/5 all ext Psychiatric: Mood and affect normal Neck: No JVD, no carotid bruits, no thyromegaly, no lymphadenopathy. Lungs:Clear bilaterally, no wheezes, rhonci,  crackles Cardiovascular: Regular rate and rhythm. Loud crisp valve click. Systolic murmur.  Abdomen:Soft. Bowel sounds present. Non-tender.  Extremities: No lower extremity edema. Pulses are 1 + in the bilateral DP/PT.

## 2011-11-08 NOTE — Patient Instructions (Signed)
Your physician wants you to follow-up in: 12 months.  You will receive a reminder letter in the mail two months in advance. If you don't receive a letter, please call our office to schedule the follow-up appointment.  Please call us when you would like to schedule CT Scan

## 2011-11-29 ENCOUNTER — Ambulatory Visit (INDEPENDENT_AMBULATORY_CARE_PROVIDER_SITE_OTHER): Payer: BC Managed Care – PPO | Admitting: *Deleted

## 2011-11-29 DIAGNOSIS — Z954 Presence of other heart-valve replacement: Secondary | ICD-10-CM

## 2011-11-29 DIAGNOSIS — Z952 Presence of prosthetic heart valve: Secondary | ICD-10-CM

## 2011-11-29 DIAGNOSIS — I359 Nonrheumatic aortic valve disorder, unspecified: Secondary | ICD-10-CM

## 2011-11-29 LAB — POCT INR: INR: 3.9

## 2011-12-04 ENCOUNTER — Telehealth: Payer: Self-pay | Admitting: Cardiovascular Disease

## 2011-12-04 NOTE — Telephone Encounter (Signed)
I spoke to the patient today about his need for CTA to assess his thoracic aortic dissection and aneurysm. He has renal insufficiency and because of this we delayed the study until he was seen by Nephrology. He has since been seen by Dr. Casimiro Needle with [facility]. Recommendations are for him to be admitted before the CTA to hydrate for 12 hours pre testing and also hold his Losartan for 48 hours before and after procedure. I have discussed these findings with the patient and he agrees to think about this and call us back when he would like to arrange admission for hydration and CTA thoracic and abdominal aorta.   Thayer Ohm

## 2011-12-13 ENCOUNTER — Ambulatory Visit (INDEPENDENT_AMBULATORY_CARE_PROVIDER_SITE_OTHER): Payer: BC Managed Care – PPO | Admitting: Pharmacist

## 2011-12-13 DIAGNOSIS — Z954 Presence of other heart-valve replacement: Secondary | ICD-10-CM

## 2011-12-13 DIAGNOSIS — Z952 Presence of prosthetic heart valve: Secondary | ICD-10-CM

## 2011-12-13 DIAGNOSIS — I359 Nonrheumatic aortic valve disorder, unspecified: Secondary | ICD-10-CM

## 2012-01-03 ENCOUNTER — Ambulatory Visit (INDEPENDENT_AMBULATORY_CARE_PROVIDER_SITE_OTHER): Payer: BC Managed Care – PPO | Admitting: *Deleted

## 2012-01-03 DIAGNOSIS — I359 Nonrheumatic aortic valve disorder, unspecified: Secondary | ICD-10-CM

## 2012-01-03 DIAGNOSIS — Z954 Presence of other heart-valve replacement: Secondary | ICD-10-CM

## 2012-01-03 DIAGNOSIS — Z952 Presence of prosthetic heart valve: Secondary | ICD-10-CM

## 2012-01-30 ENCOUNTER — Ambulatory Visit (INDEPENDENT_AMBULATORY_CARE_PROVIDER_SITE_OTHER): Payer: BC Managed Care – PPO | Admitting: *Deleted

## 2012-01-30 DIAGNOSIS — Z954 Presence of other heart-valve replacement: Secondary | ICD-10-CM

## 2012-01-30 DIAGNOSIS — Z952 Presence of prosthetic heart valve: Secondary | ICD-10-CM

## 2012-01-30 DIAGNOSIS — I359 Nonrheumatic aortic valve disorder, unspecified: Secondary | ICD-10-CM

## 2012-01-30 LAB — POCT INR: INR: 2.3

## 2012-02-27 ENCOUNTER — Ambulatory Visit (INDEPENDENT_AMBULATORY_CARE_PROVIDER_SITE_OTHER): Payer: BC Managed Care – PPO | Admitting: *Deleted

## 2012-02-27 DIAGNOSIS — Z952 Presence of prosthetic heart valve: Secondary | ICD-10-CM

## 2012-02-27 DIAGNOSIS — Z954 Presence of other heart-valve replacement: Secondary | ICD-10-CM

## 2012-02-27 DIAGNOSIS — I359 Nonrheumatic aortic valve disorder, unspecified: Secondary | ICD-10-CM

## 2012-02-27 LAB — POCT INR: INR: 2.7

## 2012-04-01 ENCOUNTER — Other Ambulatory Visit: Payer: Self-pay | Admitting: Internal Medicine

## 2012-04-05 ENCOUNTER — Other Ambulatory Visit: Payer: Self-pay | Admitting: Internal Medicine

## 2012-04-11 ENCOUNTER — Other Ambulatory Visit: Payer: Self-pay | Admitting: Internal Medicine

## 2012-04-11 ENCOUNTER — Ambulatory Visit (INDEPENDENT_AMBULATORY_CARE_PROVIDER_SITE_OTHER): Payer: BC Managed Care – PPO | Admitting: *Deleted

## 2012-04-11 DIAGNOSIS — Z954 Presence of other heart-valve replacement: Secondary | ICD-10-CM

## 2012-04-11 DIAGNOSIS — Z952 Presence of prosthetic heart valve: Secondary | ICD-10-CM

## 2012-04-11 DIAGNOSIS — I359 Nonrheumatic aortic valve disorder, unspecified: Secondary | ICD-10-CM

## 2012-04-11 LAB — POCT INR: INR: 2

## 2012-05-10 ENCOUNTER — Ambulatory Visit (INDEPENDENT_AMBULATORY_CARE_PROVIDER_SITE_OTHER): Payer: BC Managed Care – PPO

## 2012-05-10 DIAGNOSIS — Z954 Presence of other heart-valve replacement: Secondary | ICD-10-CM

## 2012-05-10 DIAGNOSIS — I359 Nonrheumatic aortic valve disorder, unspecified: Secondary | ICD-10-CM

## 2012-05-10 DIAGNOSIS — Z952 Presence of prosthetic heart valve: Secondary | ICD-10-CM

## 2012-05-31 ENCOUNTER — Other Ambulatory Visit: Payer: Self-pay | Admitting: Internal Medicine

## 2012-06-07 ENCOUNTER — Ambulatory Visit (INDEPENDENT_AMBULATORY_CARE_PROVIDER_SITE_OTHER): Payer: BC Managed Care – PPO | Admitting: *Deleted

## 2012-06-07 DIAGNOSIS — Z952 Presence of prosthetic heart valve: Secondary | ICD-10-CM

## 2012-06-07 DIAGNOSIS — Z954 Presence of other heart-valve replacement: Secondary | ICD-10-CM

## 2012-06-07 DIAGNOSIS — I359 Nonrheumatic aortic valve disorder, unspecified: Secondary | ICD-10-CM

## 2012-06-27 ENCOUNTER — Other Ambulatory Visit: Payer: Self-pay | Admitting: Internal Medicine

## 2012-07-02 ENCOUNTER — Other Ambulatory Visit: Payer: Self-pay | Admitting: Internal Medicine

## 2012-07-05 ENCOUNTER — Ambulatory Visit (INDEPENDENT_AMBULATORY_CARE_PROVIDER_SITE_OTHER): Payer: BC Managed Care – PPO

## 2012-07-05 ENCOUNTER — Other Ambulatory Visit: Payer: Self-pay | Admitting: Internal Medicine

## 2012-07-05 DIAGNOSIS — I359 Nonrheumatic aortic valve disorder, unspecified: Secondary | ICD-10-CM

## 2012-07-05 DIAGNOSIS — Z954 Presence of other heart-valve replacement: Secondary | ICD-10-CM

## 2012-07-05 DIAGNOSIS — Z952 Presence of prosthetic heart valve: Secondary | ICD-10-CM

## 2012-07-05 LAB — POCT INR: INR: 2.9

## 2012-07-10 ENCOUNTER — Other Ambulatory Visit: Payer: Self-pay | Admitting: Internal Medicine

## 2012-07-10 ENCOUNTER — Telehealth: Payer: Self-pay

## 2012-07-10 DIAGNOSIS — Z0000 Encounter for general adult medical examination without abnormal findings: Secondary | ICD-10-CM

## 2012-07-10 NOTE — Telephone Encounter (Signed)
Put lab order in for physical.

## 2012-07-15 ENCOUNTER — Other Ambulatory Visit: Payer: Self-pay

## 2012-07-15 MED ORDER — ROSUVASTATIN CALCIUM 40 MG PO TABS: 40.0000 mg | ORAL_TABLET | Freq: Every day | ORAL | Status: DC

## 2012-07-29 ENCOUNTER — Ambulatory Visit (INDEPENDENT_AMBULATORY_CARE_PROVIDER_SITE_OTHER): Payer: BC Managed Care – PPO | Admitting: Internal Medicine

## 2012-07-29 ENCOUNTER — Encounter: Payer: Self-pay | Admitting: Internal Medicine

## 2012-07-29 VITALS — BP 130/86 | HR 73 | Temp 98.7°F | Resp 16 | Wt 235.0 lb

## 2012-07-29 DIAGNOSIS — H60399 Other infective otitis externa, unspecified ear: Secondary | ICD-10-CM

## 2012-07-29 DIAGNOSIS — H6120 Impacted cerumen, unspecified ear: Secondary | ICD-10-CM

## 2012-07-29 DIAGNOSIS — Z23 Encounter for immunization: Secondary | ICD-10-CM | POA: Insufficient documentation

## 2012-07-29 DIAGNOSIS — J019 Acute sinusitis, unspecified: Secondary | ICD-10-CM

## 2012-07-29 DIAGNOSIS — H6092 Unspecified otitis externa, left ear: Secondary | ICD-10-CM | POA: Insufficient documentation

## 2012-07-29 DIAGNOSIS — H918X9 Other specified hearing loss, unspecified ear: Secondary | ICD-10-CM

## 2012-07-29 MED ORDER — NEOMYCIN-POLYMYXIN-HC 1 % OT SOLN: 3.0000 [drp] | Freq: Four times a day (QID) | OTIC | Status: DC

## 2012-07-29 MED ORDER — AMOXICILLIN-POT CLAVULANATE 875-125 MG PO TABS: 1.0000 | ORAL_TABLET | Freq: Two times a day (BID) | ORAL | Status: DC

## 2012-07-29 NOTE — Assessment & Plan Note (Signed)
His ear was successfully irrigated

## 2012-07-29 NOTE — Progress Notes (Signed)
Subjective:    Patient ID: Austin [name], male    DOB: 1956/01/08, 56 y.o.   MRN: [id number]  Sinusitis This is a new problem. The current episode started 1 to 4 weeks ago. The problem has been gradually worsening since onset. There has been no fever. The fever has been present for less than 1 day. He is experiencing no pain. Associated symptoms include ear pain (left) and sinus pressure. Pertinent negatives include no chills, congestion, coughing, diaphoresis, headaches, hoarse voice, neck pain, shortness of breath, sneezing, sore throat or swollen glands. Past treatments include nothing.      Review of Systems  Constitutional: Negative for fever, chills, diaphoresis, activity change, appetite change, fatigue and unexpected weight change.  HENT: Positive for ear pain (left), facial swelling, dental problem (he had left upper dental work done a few weeks ago) and sinus pressure. Negative for hearing loss, nosebleeds, congestion, sore throat, hoarse voice, rhinorrhea, sneezing, drooling, mouth sores, trouble swallowing, neck pain, neck stiffness, voice change, postnasal drip, tinnitus and ear discharge.   Eyes: Negative.   Respiratory: Negative.  Negative for cough, shortness of breath and stridor.   Cardiovascular: Negative.   Gastrointestinal: Negative.   Genitourinary: Negative.   Musculoskeletal: Negative.   Skin: Negative.   Neurological: Negative.  Negative for headaches.  Hematological: Negative for adenopathy. Does not bruise/bleed easily.  Psychiatric/Behavioral: Negative.        Objective:   Physical Exam  Vitals reviewed. Constitutional: He is oriented to person, place, and time. He appears well-developed and well-nourished.  Non-toxic appearance. He does not have a sickly appearance. He does not appear ill. No distress.  HENT:  Head: Normocephalic and atraumatic. No trismus in the jaw.    Right Ear: Hearing, tympanic membrane, external ear and ear canal normal.  Left  Ear: Hearing and tympanic membrane normal. No lacerations. There is tenderness. No drainage or swelling. A foreign body (cerumen in left EAC) is present. No mastoid tenderness. Tympanic membrane is not injected, not scarred, not perforated, not erythematous, not retracted and not bulging. Tympanic membrane mobility is normal.  No middle ear effusion. No hemotympanum. No decreased hearing is noted.  Nose: No mucosal edema, rhinorrhea or sinus tenderness.  No foreign bodies. Right sinus exhibits no maxillary sinus tenderness and no frontal sinus tenderness. Left sinus exhibits maxillary sinus tenderness. Left sinus exhibits no frontal sinus tenderness.  Mouth/Throat: Oropharynx is clear and moist and mucous membranes are normal. Mucous membranes are not pale, not dry and not cyanotic. No oral lesions. No uvula swelling. No oropharyngeal exudate, posterior oropharyngeal edema, posterior oropharyngeal erythema or tonsillar abscesses.       Left eac is mildly red and swollen I put colace in his left ear then irrigated it He tolerated it well  Eyes: Conjunctivae normal are normal. Right eye exhibits no discharge. Left eye exhibits no discharge. No scleral icterus.  Neck: Normal range of motion. Neck supple. No JVD present. No tracheal deviation present. No thyromegaly present.  Cardiovascular: Normal rate, regular rhythm, normal heart sounds and intact distal pulses.  Exam reveals no gallop and no friction rub.   No murmur heard. Pulmonary/Chest: Effort normal and breath sounds normal. No stridor. No respiratory distress. He has no wheezes. He has no rales. He exhibits no tenderness.  Abdominal: Soft. Bowel sounds are normal. He exhibits no distension and no mass. There is no tenderness. There is no rebound and no guarding.  Musculoskeletal: Normal range of motion. He exhibits no edema and no  tenderness.  Lymphadenopathy:    He has no cervical adenopathy.  Neurological: He is oriented to person, place, and  time.  Skin: Skin is warm and dry. No rash noted. He is not diaphoretic. No erythema. No pallor.  Psychiatric: He has a normal mood and affect. His behavior is normal. Judgment and thought content normal.          Assessment & Plan:

## 2012-07-29 NOTE — Assessment & Plan Note (Signed)
Start cortisporin otic susp and augmentin for the infection

## 2012-07-29 NOTE — Assessment & Plan Note (Signed)
Start augmentin for the infection

## 2012-07-29 NOTE — Patient Instructions (Signed)
Otitis Externa Otitis externa is a bacterial or fungal infection of the outer ear canal. This is the area from the eardrum to the outside of the ear. Otitis externa is sometimes called "swimmer's ear." CAUSES  Possible causes of infection include:  Swimming in dirty water.  Moisture remaining in the ear after swimming or bathing.  Mild injury (trauma) to the ear.  Objects stuck in the ear (foreign body).  Cuts or scrapes (abrasions) on the outside of the ear. SYMPTOMS  The first symptom of infection is often itching in the ear canal. Later signs and symptoms may include swelling and redness of the ear canal, ear pain, and yellowish-white fluid (pus) coming from the ear. The ear pain may be worse when pulling on the earlobe. DIAGNOSIS  Your caregiver will perform a physical exam. A sample of fluid may be taken from the ear and examined for bacteria or fungi. TREATMENT  Antibiotic ear drops are often given for 10 to 14 days. Treatment may also include pain medicine or corticosteroids to reduce itching and swelling. PREVENTION   Keep your ear dry. Use the corner of a towel to absorb water out of the ear canal after swimming or bathing.  Avoid scratching or putting objects inside your ear. This can damage the ear canal or remove the protective wax that lines the canal. This makes it easier for bacteria and fungi to grow.  Avoid swimming in lakes, polluted water, or poorly chlorinated pools.  You may use ear drops made of rubbing alcohol and vinegar after swimming. Combine equal parts of white vinegar and alcohol in a bottle. Put 3 or 4 drops into each ear after swimming. HOME CARE INSTRUCTIONS   Apply antibiotic ear drops to the ear canal as prescribed by your caregiver.  Only take over-the-counter or prescription medicines for pain, discomfort, or fever as directed by your caregiver.  If you have diabetes, follow any additional treatment instructions from your caregiver.  Keep all  follow-up appointments as directed by your caregiver. SEEK MEDICAL CARE IF:   You have a fever.  Your ear is still red, swollen, painful, or draining pus after 3 days.  Your redness, swelling, or pain gets worse.  You have a severe headache.  You have redness, swelling, pain, or tenderness in the area behind your ear. MAKE SURE YOU:   Understand these instructions.  Will watch your condition.  Will get help right away if you are not doing well or get worse. Document Released: 07/17/2005 Document Revised: 10/09/2011 Document Reviewed: 08/03/2011 ExitCare Patient Information 2013 [employer]. Sinusitis Sinusitis is redness, soreness, and swelling (inflammation) of the paranasal sinuses. Paranasal sinuses are air pockets within the bones of your face (beneath the eyes, the middle of the forehead, or above the eyes). In healthy paranasal sinuses, mucus is able to drain out, and air is able to circulate through them by way of your nose. However, when your paranasal sinuses are inflamed, mucus and air can become trapped. This can allow bacteria and other germs to grow and cause infection. Sinusitis can develop quickly and last only a short time (acute) or continue over a long period (chronic). Sinusitis that lasts for more than 12 weeks is considered chronic.  CAUSES  Causes of sinusitis include:  Allergies.  Structural abnormalities, such as displacement of the cartilage that separates your nostrils (deviated septum), which can decrease the air flow through your nose and sinuses and affect sinus drainage.  Functional abnormalities, such as when the small   hairs (cilia) that line your sinuses and help remove mucus do not work properly or are not present. SYMPTOMS  Symptoms of acute and chronic sinusitis are the same. The primary symptoms are pain and pressure around the affected sinuses. Other symptoms include:  Upper toothache.  Earache.  Headache.  Bad breath.  Decreased  sense of smell and taste.  A cough, which worsens when you are lying flat.  Fatigue.  Fever.  Thick drainage from your nose, which often is green and may contain pus (purulent).  Swelling and warmth over the affected sinuses. DIAGNOSIS  Your caregiver will perform a physical exam. During the exam, your caregiver may:  Look in your nose for signs of abnormal growths in your nostrils (nasal polyps).  Tap over the affected sinus to check for signs of infection.  View the inside of your sinuses (endoscopy) with a special imaging device with a light attached (endoscope), which is inserted into your sinuses. If your caregiver suspects that you have chronic sinusitis, one or more of the following tests may be recommended:  Allergy tests.  Nasal culture A sample of mucus is taken from your nose and sent to a lab and screened for bacteria.  Nasal cytology A sample of mucus is taken from your nose and examined by your caregiver to determine if your sinusitis is related to an allergy. TREATMENT  Most cases of acute sinusitis are related to a viral infection and will resolve on their own within 10 days. Sometimes medicines are prescribed to help relieve symptoms (pain medicine, decongestants, nasal steroid sprays, or saline sprays).  However, for sinusitis related to a bacterial infection, your caregiver will prescribe antibiotic medicines. These are medicines that will help kill the bacteria causing the infection.  Rarely, sinusitis is caused by a fungal infection. In theses cases, your caregiver will prescribe antifungal medicine. For some cases of chronic sinusitis, surgery is needed. Generally, these are cases in which sinusitis recurs more than 3 times per year, despite other treatments. HOME CARE INSTRUCTIONS   Drink plenty of water. Water helps thin the mucus so your sinuses can drain more easily.  Use a humidifier.  Inhale steam 3 to 4 times a day (for example, sit in the bathroom  with the shower running).  Apply a warm, moist washcloth to your face 3 to 4 times a day, or as directed by your caregiver.  Use saline nasal sprays to help moisten and clean your sinuses.  Take over-the-counter or prescription medicines for pain, discomfort, or fever only as directed by your caregiver. SEEK IMMEDIATE MEDICAL CARE IF:  You have increasing pain or severe headaches.  You have nausea, vomiting, or drowsiness.  You have swelling around your face.  You have vision problems.  You have a stiff neck.  You have difficulty breathing. MAKE SURE YOU:   Understand these instructions.  Will watch your condition.  Will get help right away if you are not doing well or get worse. Document Released: 07/17/2005 Document Revised: 10/09/2011 Document Reviewed: 08/01/2011 ExitCare Patient Information 2013 [employer].  

## 2012-08-09 ENCOUNTER — Ambulatory Visit (INDEPENDENT_AMBULATORY_CARE_PROVIDER_SITE_OTHER): Payer: BC Managed Care – PPO

## 2012-08-09 DIAGNOSIS — Z952 Presence of prosthetic heart valve: Secondary | ICD-10-CM

## 2012-08-09 DIAGNOSIS — I359 Nonrheumatic aortic valve disorder, unspecified: Secondary | ICD-10-CM

## 2012-08-09 DIAGNOSIS — Z954 Presence of other heart-valve replacement: Secondary | ICD-10-CM

## 2012-08-16 ENCOUNTER — Encounter: Payer: Self-pay | Admitting: Internal Medicine

## 2012-08-16 ENCOUNTER — Ambulatory Visit (INDEPENDENT_AMBULATORY_CARE_PROVIDER_SITE_OTHER): Payer: BC Managed Care – PPO | Admitting: Internal Medicine

## 2012-08-16 VITALS — BP 132/84 | HR 73 | Temp 98.0°F | Ht 68.0 in | Wt 235.0 lb

## 2012-08-16 DIAGNOSIS — I10 Essential (primary) hypertension: Secondary | ICD-10-CM

## 2012-08-16 DIAGNOSIS — I2510 Atherosclerotic heart disease of native coronary artery without angina pectoris: Secondary | ICD-10-CM

## 2012-08-16 DIAGNOSIS — J019 Acute sinusitis, unspecified: Secondary | ICD-10-CM

## 2012-08-16 MED ORDER — LEVOFLOXACIN 250 MG PO TABS: 250.0000 mg | ORAL_TABLET | Freq: Every day | ORAL | Status: DC

## 2012-08-16 NOTE — Patient Instructions (Addendum)
Please take all new medication as prescribed Please continue all other medications as before You can also take Mucinex (or it's generic off brand) for congestion, and tylenol as needed for pain. You should get a call from Bailey Mech about the Coumadin Clinic at [place] Thank you for enrolling in [facility]. Please follow the instructions below to securely access your online medical record. MyChart allows you to send messages to your doctor, view your test results, renew your prescriptions, schedule appointments, and more. To Log into My Chart online, please go by [employer] or [employer] to [email or web address], or download the MyChart App from the [employer] of [employer] .  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 Please return as you have planned for next month

## 2012-08-17 ENCOUNTER — Encounter: Payer: Self-pay | Admitting: Internal Medicine

## 2012-08-17 NOTE — Assessment & Plan Note (Signed)
Mild to mod, for antibx course,  to f/u any worsening symptoms or concerns 

## 2012-08-17 NOTE — Progress Notes (Signed)
Subjective:    Patient ID: SENDER RUEB, male    DOB: 04-28-1956, 57 y.o.   MRN: [id number]  HPI  Here with 2-3 days acute onset fever, facial pain, left ear pain, pressure, headache, general weakness and malaise, and greenish d/c, with mild ST and cough, but pt denies chest pain, wheezing, increased sob or doe, orthopnea, PND, increased LE swelling, palpitations, dizziness or syncope.   Pt denies polydipsia, polyuria.  Pt denies new neurological symptoms such as new headache, or facial or extremity weakness or numbness  No overt bleeding or bruising.  Would like to start coumadin clinic at [facility], due to cost and access.  Had recent augmentin course, better symptomatically, then worse again. Past Medical History  Diagnosis Date  . Coronary artery disease   . Hypertension   . Hyperlipidemia   . Obesity   . Internal hemorrhoid   . Pericardial effusion   . Thyroid disease   . Back pain   . Anemia     iron def  . Thoracic aortic aneurysm   . Renal insufficiency 05/16/2011   Past Surgical History  Procedure Date  . Impingment     right shoulder  . Abdominal aortic aneurysm repair   . Aortic valve replacement   . Knee arthroscopy     left  . Wisdom tooth extraction     reports that he has quit smoking. He has never used smokeless tobacco. He reports that he does not drink alcohol or use illicit drugs. family history includes Cancer in his father. Allergies  Allergen Reactions  . Celecoxib    Current Outpatient Prescriptions on File Prior to Visit  Medication Sig Dispense Refill  . fenofibrate 160 MG tablet TAKE 1 TABLET (160 MG TOTAL) DAILY  90 tablet  0  . ferrous sulfate 325 (65 FE) MG tablet Take 325 mg by mouth daily with breakfast.        . losartan (COZAAR) 50 MG tablet TAKE 1 TABLET DAILY (NO FURTHER REFILLS WITHOUT AN APPOINTMENT)  90 tablet  1  . metoprolol (TOPROL-XL) 200 MG 24 hr tablet TAKE 1 TABLET (200 MG TOTAL) DAILY  (MUST MAKE AN APPOINTMENT FOR FURTHER REFILLS)   30 tablet  0  . Multiple Vitamin (MULTIVITAMIN) tablet Take 1 tablet by mouth daily.        . NEOMYCIN-POLYMYXIN-HC, OTIC, (CORTISPORIN) 1 % SOLN Place 3 drops into the left ear every 6 (six) hours.  10 mL  0  . rosuvastatin (CRESTOR) 40 MG tablet Take 1 tablet (40 mg total) by mouth daily.  90 tablet  3  . warfarin (COUMADIN) 5 MG tablet TAKE 1 TABLET (5 MG TOTAL) AS DIRECTED  100 tablet  0   Review of Systems  Constitutional: Negative for unexpected weight change, or unusual diaphoresis  HENT: Negative for tinnitus.   Eyes: Negative for photophobia and visual disturbance.  Respiratory: Negative for choking and stridor.   Gastrointestinal: Negative for vomiting and blood in stool.  Genitourinary: Negative for hematuria and decreased urine volume.  Musculoskeletal: Negative for acute joint swelling Skin: Negative for color change and wound.  Neurological: Negative for tremors and numbness other than noted  Psychiatric/Behavioral: Negative for decreased concentration or  hyperactivity.       Objective:   Physical Exam BP 132/84  Pulse 73  Temp 98 F (36.7 C) (Oral)  Ht 5\' 8"  (1.727 m)  Wt 235 lb (106.595 kg)  BMI 35.73 kg/m2  SpO2 96% VS noted, mild ill Constitutional: Pt  appears well-developed and well-nourished. Lavella Lemons HENT: Head: NCAT.  Right Ear: External ear normal.  Left Ear: External ear normal.  Bilat tm's with mild erythema left > right,  Max sinus areas mild tender.  Pharynx with mild erythema, no exudate Eyes: Conjunctivae and EOM are normal. Pupils are equal, round, and reactive to light.  Neck: Normal range of motion. Neck supple.  Cardiovascular: Normal rate and regular rhythm.   Pulmonary/Chest: Effort normal and breath sounds normal.  Neurological: Pt is alert. Not confused  Skin: Skin is warm. No erythema.  Psychiatric: Pt behavior is normal. Thought content normal.     Assessment & Plan:

## 2012-08-17 NOTE — Assessment & Plan Note (Signed)
stable overall by history and exam, recent data reviewed with pt, and pt to continue medical treatment as before,  to f/u any worsening symptoms or concerns BP Readings from Last 3 Encounters:  08/16/12 132/84  07/29/12 130/86  11/08/11 108/71    

## 2012-08-17 NOTE — Assessment & Plan Note (Signed)
stable overall by history and exam, recent data reviewed with pt, and pt to continue medical treatment as before,  to f/u any worsening symptoms or concerns BP Readings from Last 3 Encounters:  08/16/12 132/84  07/29/12 130/86  11/08/11 108/71

## 2012-08-30 ENCOUNTER — Other Ambulatory Visit (INDEPENDENT_AMBULATORY_CARE_PROVIDER_SITE_OTHER): Payer: BC Managed Care – PPO

## 2012-08-30 DIAGNOSIS — Z0000 Encounter for general adult medical examination without abnormal findings: Secondary | ICD-10-CM

## 2012-08-30 LAB — HEPATIC FUNCTION PANEL
AST: 34 U/L (ref 0–37)
Albumin: 4.7 g/dL (ref 3.5–5.2)
Alkaline Phosphatase: 23 U/L — ABNORMAL LOW (ref 39–117)
Total Protein: 7 g/dL (ref 6.0–8.3)

## 2012-08-30 LAB — BASIC METABOLIC PANEL
BUN: 27 mg/dL — ABNORMAL HIGH (ref 6–23)
CO2: 28 mEq/L (ref 19–32)
Glucose, Bld: 114 mg/dL — ABNORMAL HIGH (ref 70–99)
Potassium: 4.1 mEq/L (ref 3.5–5.1)
Sodium: 138 mEq/L (ref 135–145)

## 2012-08-30 LAB — PSA: PSA: 1.21 ng/mL (ref 0.10–4.00)

## 2012-08-30 LAB — CBC WITH DIFFERENTIAL/PLATELET
Basophils Absolute: 0 10*3/uL (ref 0.0–0.1)
HCT: 47.3 % (ref 39.0–52.0)
Lymphs Abs: 1.6 10*3/uL (ref 0.7–4.0)
MCHC: 34.4 g/dL (ref 30.0–36.0)
MCV: 84.9 fl (ref 78.0–100.0)
Monocytes Absolute: 0.5 10*3/uL (ref 0.1–1.0)
Platelets: 169 10*3/uL (ref 150.0–400.0)
RDW: 13.7 % (ref 11.5–14.6)

## 2012-08-30 LAB — LDL CHOLESTEROL, DIRECT: Direct LDL: 128.2 mg/dL

## 2012-08-30 LAB — LIPID PANEL
HDL: 40 mg/dL (ref >39.00)
Total CHOL/HDL Ratio: 5
VLDL: 39.4 mg/dL (ref 0.0–40.0)

## 2012-08-30 LAB — URINALYSIS, ROUTINE W REFLEX MICROSCOPIC
Bilirubin Urine: NEGATIVE
Hgb urine dipstick: NEGATIVE
Ketones, ur: NEGATIVE
Urine Glucose: NEGATIVE
Urobilinogen, UA: 0.2 (ref 0.0–1.0)

## 2012-09-05 ENCOUNTER — Ambulatory Visit (INDEPENDENT_AMBULATORY_CARE_PROVIDER_SITE_OTHER): Payer: BC Managed Care – PPO | Admitting: Internal Medicine

## 2012-09-05 ENCOUNTER — Encounter: Payer: Self-pay | Admitting: Internal Medicine

## 2012-09-05 VITALS — BP 120/80 | HR 69 | Temp 98.0°F | Ht 68.0 in | Wt 235.5 lb

## 2012-09-05 DIAGNOSIS — I10 Essential (primary) hypertension: Secondary | ICD-10-CM

## 2012-09-05 DIAGNOSIS — H9192 Unspecified hearing loss, left ear: Secondary | ICD-10-CM | POA: Insufficient documentation

## 2012-09-05 DIAGNOSIS — N189 Chronic kidney disease, unspecified: Secondary | ICD-10-CM

## 2012-09-05 DIAGNOSIS — H9190 Unspecified hearing loss, unspecified ear: Secondary | ICD-10-CM

## 2012-09-05 DIAGNOSIS — Z0000 Encounter for general adult medical examination without abnormal findings: Secondary | ICD-10-CM

## 2012-09-05 NOTE — Progress Notes (Signed)
Subjective:    Patient ID: Austin [name], male    DOB: Nov 21, 1955, 57 y.o.   MRN: [id number]  HPI  Here for wellness and f/u;  Overall doing ok;  Pt denies CP, worsening SOB, DOE, wheezing, orthopnea, PND, worsening LE edema, palpitations, dizziness or syncope.  Pt denies neurological change such as new headache, facial or extremity weakness.  Pt denies polydipsia, polyuria, or low sugar symptoms. Pt states overall good compliance with treatment and medications, good tolerability, and has been trying to follow lower cholesterol diet.  Pt denies worsening depressive symptoms, suicidal ideation or panic. No fever, night sweats, wt loss, loss of appetite, or other constitutional symptoms.  Pt states good ability with ADL's, has low fall risk, home safety reviewed and adequate, no other significant changes in hearing or vision, and only occasionally active with exercise. Still has fullness in the left ear, ongoing hearing loss not improved.   Past Medical History  Diagnosis Date  . Coronary artery disease   . Hypertension   . Hyperlipidemia   . Obesity   . Internal hemorrhoid   . Pericardial effusion   . Thyroid disease   . Back pain   . Anemia     iron def  . Thoracic aortic aneurysm   . Renal insufficiency 05/16/2011   Past Surgical History  Procedure Date  . Impingment     right shoulder  . Abdominal aortic aneurysm repair   . Aortic valve replacement   . Knee arthroscopy     left  . Wisdom tooth extraction     reports that he has quit smoking. He has never used smokeless tobacco. He reports that he does not drink alcohol or use illicit drugs. family history includes Cancer in his father. Allergies  Allergen Reactions  . Celecoxib    Current Outpatient Prescriptions on File Prior to Visit  Medication Sig Dispense Refill  . fenofibrate 160 MG tablet TAKE 1 TABLET (160 MG TOTAL) DAILY  90 tablet  0  . ferrous sulfate 325 (65 FE) MG tablet Take 325 mg by mouth daily with  breakfast.        . losartan (COZAAR) 50 MG tablet TAKE 1 TABLET DAILY (NO FURTHER REFILLS WITHOUT AN APPOINTMENT)  90 tablet  1  . metoprolol (TOPROL-XL) 200 MG 24 hr tablet TAKE 1 TABLET (200 MG TOTAL) DAILY  (MUST MAKE AN APPOINTMENT FOR FURTHER REFILLS)  30 tablet  0  . Multiple Vitamin (MULTIVITAMIN) tablet Take 1 tablet by mouth daily.        . rosuvastatin (CRESTOR) 40 MG tablet Take 1 tablet (40 mg total) by mouth daily.  90 tablet  3  . warfarin (COUMADIN) 5 MG tablet TAKE 1 TABLET (5 MG TOTAL) AS DIRECTED  100 tablet  0   Review of Systems Constitutional: Negative for diaphoresis, activity change, appetite change or unexpected weight change.  HENT: Negative for hearing loss, ear pain, facial swelling, mouth sores and neck stiffness.   Eyes: Negative for pain, redness and visual disturbance.  Respiratory: Negative for shortness of breath and wheezing.   Cardiovascular: Negative for chest pain and palpitations.  Gastrointestinal: Negative for diarrhea, blood in stool, abdominal distention or other pain Genitourinary: Negative for hematuria, flank pain or change in urine volume.  Musculoskeletal: Negative for myalgias and joint swelling.  Skin: Negative for color change and wound.  Neurological: Negative for syncope and numbness. other than noted Hematological: Negative for adenopathy.  Psychiatric/Behavioral: Negative for hallucinations, self-injury, decreased concentration and  agitation.      Objective:   Physical Exam BP 120/80  Pulse 69  Temp 98 F (36.7 C) (Oral)  Ht 5\' 8"  (1.727 m)  Wt 235 lb 8 oz (106.822 kg)  BMI 35.81 kg/m2  SpO2 98% VS noted,  Constitutional: Pt is oriented to person, place, and time. Appears well-developed and well-nourished.  Head: Normocephalic and atraumatic.  Right Ear: External ear normal.  Left Ear: External ear normal.  Nose: Nose normal.  Mouth/Throat: Oropharynx is clear and moist.  Eyes: Conjunctivae and EOM are normal. Pupils are  equal, round, and reactive to light.  Neck: Normal range of motion. Neck supple. No JVD present. No tracheal deviation present.  Cardiovascular: Normal rate, regular rhythm, normal heart sounds and intact distal pulses.   Pulmonary/Chest: Effort normal and breath sounds normal.  Abdominal: Soft. Bowel sounds are normal. There is no tenderness. No HSM  Musculoskeletal: Normal range of motion. Exhibits no edema.  Lymphadenopathy:  Has no cervical adenopathy.  Neurological: Pt is alert and oriented to person, place, and time. Pt has normal reflexes. No cranial nerve deficit.  Skin: Skin is warm and dry. No rash noted.  Psychiatric:  Has  normal mood and affect. Behavior is normal.     Assessment & Plan:

## 2012-09-05 NOTE — Assessment & Plan Note (Signed)
S/p 2 antibx, wax removed last visit, still with hearing loss - for ENT referral

## 2012-09-05 NOTE — Assessment & Plan Note (Signed)

## 2012-09-05 NOTE — Patient Instructions (Addendum)
Please continue all other medications as before, and refills have been done if requested. Please keep your appointments with your specialists as you have planned - renal,  Gastroenterology for the colonoscopy, and ENT for the left hearing loss Please continue your efforts at being more active, low cholesterol diet, and weight control. Your EKG was ok today You are otherwise up to date with prevention measures today. Please keep your appointments with your specialists as you have planned - cardiology Thank you for enrolling in MyChart. Please follow the instructions below to securely access your online medical record. MyChart allows you to send messages to your doctor, view your test results, renew your prescriptions, schedule appointments, and more. Please return in 1 year for your yearly visit, or sooner if needed, with Lab testing done 3-5 days before

## 2012-09-05 NOTE — Assessment & Plan Note (Signed)
With persistent elev Cr, GFR < 60, abnormal renal u/s oct 2012 - for renal referral, cont same meds for now

## 2012-09-06 ENCOUNTER — Encounter: Payer: Self-pay | Admitting: Physician Assistant

## 2012-09-16 ENCOUNTER — Ambulatory Visit (INDEPENDENT_AMBULATORY_CARE_PROVIDER_SITE_OTHER): Payer: BC Managed Care – PPO | Admitting: Physician Assistant

## 2012-09-16 ENCOUNTER — Encounter: Payer: Self-pay | Admitting: Physician Assistant

## 2012-09-16 VITALS — BP 118/70 | HR 66 | Ht 68.0 in | Wt 231.4 lb

## 2012-09-16 DIAGNOSIS — Z9229 Personal history of other drug therapy: Secondary | ICD-10-CM | POA: Insufficient documentation

## 2012-09-16 DIAGNOSIS — Z7901 Long term (current) use of anticoagulants: Secondary | ICD-10-CM | POA: Insufficient documentation

## 2012-09-16 DIAGNOSIS — Z1211 Encounter for screening for malignant neoplasm of colon: Secondary | ICD-10-CM

## 2012-09-16 NOTE — Patient Instructions (Addendum)
Please call back to schedule office visit for colonoscopy once you have been cleared by Dr. Clifton James.   We will send him the office note from today.

## 2012-09-16 NOTE — Progress Notes (Signed)
Do Ifob

## 2012-09-16 NOTE — Progress Notes (Signed)
Subjective:    Patient ID: Austin [name], male    DOB: 1955-12-10, 57 y.o.   MRN: [id number]  HPI Diaz is a very nice 57 year old African American male known to Dr. Jarold Motto from previous evaluation. He had had colonoscopy done at [facility] in 2003 which was a negative exam. He had undergone workup in 2008 4 microcytic anemia and heme positive stool with capsule endoscopy and was found to have erosive gastritis and a small polypoid-appearing lesion in the distal small bowel . He comes in today to discuss followup colonoscopy for screening. He has no GI complaints, specifically has not noted any problems with abdominal pain changes in his bowel habits melena or hematochezia. He does have history of a St. 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 Jude valve placed. Have reviewed the cardiology notes which show that he has had a residual thoracic and abdominal aortic dissection since then and imaging has shown this dissection extending from the ascending  aorta down into the abdominal aorta and iliacs. He had an echo in March of 2012 with normal LV function, however the descending thoracic aorta had enlarged in size and he was to have a CTA in the spring of 2013. This was delayed because of mild renal insufficiency. I asked the patient today whether he had this done at [facility] and he states that he did not come back for followup and has not had repeat CTA done.he states he has an upcoming appointment scheduled with Dr. Clifton James  to discuss .    Review of Systems  Constitutional: Negative.   HENT: Negative.   Eyes: Negative.   Respiratory: Negative.   Cardiovascular: Negative.   Gastrointestinal: Negative.   Endocrine: Negative.   Genitourinary: Negative.   Allergic/Immunologic: Negative.   Neurological: Negative.    Hematological: Negative.   Psychiatric/Behavioral: Negative.    Outpatient Prescriptions Prior to Visit  Medication Sig Dispense Refill  . fenofibrate 160 MG tablet TAKE 1 TABLET (160 MG TOTAL) DAILY  90 tablet  0  . ferrous sulfate 325 (65 FE) MG tablet Take 325 mg by mouth daily with breakfast.        . losartan (COZAAR) 50 MG tablet TAKE 1 TABLET DAILY (NO FURTHER REFILLS WITHOUT AN APPOINTMENT)  90 tablet  1  . metoprolol (TOPROL-XL) 200 MG 24 hr tablet TAKE 1 TABLET (200 MG TOTAL) DAILY  (MUST MAKE AN APPOINTMENT FOR FURTHER REFILLS)  30 tablet  0  . Multiple Vitamin (MULTIVITAMIN) tablet Take 1 tablet by mouth daily.        . rosuvastatin (CRESTOR) 40 MG tablet Take 1 tablet (40 mg total) by mouth daily.  90 tablet  3  . warfarin (COUMADIN) 5 MG tablet TAKE 1 TABLET (5 MG TOTAL) AS DIRECTED  100 tablet  0   No facility-administered medications prior to visit.   Allergies  Allergen Reactions  . Celecoxib    Patient Active Problem List  Diagnosis  . HYPOTHYROIDISM, SECONDARY  . HYPERLIPIDEMIA  . OBESITY  . Unspecified Iron Deficiency Anemia  . HYPERTENSION  . CORONARY ARTERY DISEASE  . PERICARDIAL EFFUSION  . Aortic valve disorders  . THORACIC AORTIC ANEURYSM  . ILIAC ARTERY ANEURYSM  . Internal hemorrhoids without mention of complication  . EROSIVE GASTRITIS  . LOW BACK PAIN  . LUMBAR RADICULOPATHY, RIGHT  .  SHOULDER IMPINGEMENT SYNDROME, RIGHT  . PLANTAR FASCIITIS, RIGHT  . S/P aortic valve replacement  . CAROTID BRUIT  . Preventative health care  . CKD (chronic kidney disease)  . Acute sinusitis, unspecified  . Left ear hearing loss  . Chronic anticoagulation   History  Substance Use Topics  . Smoking status: Former Smoker    Quit date: 10/10/2001  . Smokeless tobacco: Never Used  . Alcohol Use: Yes     Comment: one drink per day   family history includes Liver cancer in his father.     Objective:   Physical Exam  well-developed obese African American  male in no acute distress, quite pleasant. Blood pressure 118/70 pulse 66 height 5 foot 8 weight 231. HEENT; nontraumatic normocephalic EOMI PERRLA sclera anicteric, Neck; supple no JVD, Cardiovascular; regular rate and rhythm with S1-S2 he has a systolic murmur and artificial valve click. Sternal incisional scar Pulmonary; clear bilaterally, Abdomen; large soft nontender nondistended bowel sounds are active no palpable mass or hepatosplenomegaly, Rectal; exam not done, Extremities; no clubbing cyanosis or edema skin warm and dry, Psych; mood and affect normal and appropriate.        Assessment & Plan:  #27 57 year old African American male with negative colonoscopy 2003, average risk-here to discuss followup colonoscopy for screening, #2 chronic anticoagulation with Coumadin #3 status post a St. Jude aortic valve replacement 2003 #4 status post repair of ascending aortic aneurysm with dissection 2003-patient with known residual thoracic and abdominal aortic dissection with enlargement of the descending thoracic aorta to 5.5 x 5.5 cm as of 2012. He was to have CTA for further evaluation about a year ago but did not pursue this  Plan; patient should have a screening colonoscopy, however I believe his aneurysmal dissection should be further evaluated first , and colonoscopy may be contraindicated if he has any significant dissection into the abdominal aorta. Also he would need to, off of his Coumadin, and likely be bridged with Lovenox  Peri-procedure because of his aortic valve. This will need to be discussed with Dr. Zola Button. I discussed all this with the patient and advised him to get clearance from cardiology and that we will be happy to schedule him for colonoscopy at any point later in the year.

## 2012-09-16 NOTE — Progress Notes (Signed)
I agree ...not sure colonoscopy worth the risk if not with + stools or iron def

## 2012-09-20 ENCOUNTER — Ambulatory Visit (INDEPENDENT_AMBULATORY_CARE_PROVIDER_SITE_OTHER): Payer: BC Managed Care – PPO | Admitting: General Practice

## 2012-09-20 DIAGNOSIS — Z954 Presence of other heart-valve replacement: Secondary | ICD-10-CM

## 2012-09-20 DIAGNOSIS — Z952 Presence of prosthetic heart valve: Secondary | ICD-10-CM

## 2012-09-20 DIAGNOSIS — I359 Nonrheumatic aortic valve disorder, unspecified: Secondary | ICD-10-CM

## 2012-10-01 ENCOUNTER — Ambulatory Visit (INDEPENDENT_AMBULATORY_CARE_PROVIDER_SITE_OTHER): Payer: BC Managed Care – PPO | Admitting: General Practice

## 2012-10-01 DIAGNOSIS — Z952 Presence of prosthetic heart valve: Secondary | ICD-10-CM

## 2012-10-01 DIAGNOSIS — I359 Nonrheumatic aortic valve disorder, unspecified: Secondary | ICD-10-CM

## 2012-10-01 DIAGNOSIS — Z954 Presence of other heart-valve replacement: Secondary | ICD-10-CM

## 2012-10-01 LAB — POCT INR: INR: 2.7

## 2012-10-11 ENCOUNTER — Encounter: Payer: Self-pay | Admitting: Internal Medicine

## 2012-10-11 ENCOUNTER — Telehealth: Payer: Self-pay | Admitting: Internal Medicine

## 2012-10-11 ENCOUNTER — Ambulatory Visit (INDEPENDENT_AMBULATORY_CARE_PROVIDER_SITE_OTHER): Payer: BC Managed Care – PPO | Admitting: Internal Medicine

## 2012-10-11 VITALS — BP 112/70 | HR 92 | Temp 99.5°F | Ht 68.0 in | Wt 230.0 lb

## 2012-10-11 DIAGNOSIS — J111 Influenza due to unidentified influenza virus with other respiratory manifestations: Secondary | ICD-10-CM

## 2012-10-11 MED ORDER — OSELTAMIVIR PHOSPHATE 75 MG PO CAPS: 75.0000 mg | ORAL_CAPSULE | Freq: Two times a day (BID) | ORAL | Status: DC

## 2012-10-11 NOTE — Telephone Encounter (Signed)
Patient Information:  Caller Name: Sevag  Phone: [phone]  Patient: Austin [name]  Gender: Male  DOB: 1956/01/19  Age: 57 Years  PCP: Oliver Barre (Adults only)  Office Follow Up:  Does the office need to follow up with this patient?: No  Instructions For The Office: N/A   Symptoms  Reason For Call & Symptoms: FLU LIKE SYMPTOMS:  fever, chills,aches  Reviewed Health History In EMR: Yes  Reviewed Medications In EMR: Yes  Reviewed Allergies In EMR: Yes  Reviewed Surgeries / Procedures: Yes  Date of Onset of Symptoms: 10/10/2012  Any Fever: Yes  Fever Taken: Oral  Fever Time Of Reading: 12:00:00  Fever Last Reading: 99.6  Guideline(s) Used:  Fever  Disposition Per Guideline:   See Today in Office  Reason For Disposition Reached:   Patient wants to be seen  Advice Given:  N/A  Patient Will Follow Care Advice:  YES  Appointment Scheduled:  10/11/2012 13:00:00 Appointment Scheduled Provider:  Nicki Reaper

## 2012-10-11 NOTE — Progress Notes (Signed)
HPI  Pt presents to the clinic today with c/o fever, chills, body aches and cough x 1 day. He felt fine yesterday, then all of a sudden yesterday afternoon he felt like he had been hit by a truck. His fever has been up to 101. He has not taken anything OTC as he is unsure of what he can take with his coumadin. He did receive a flu shot. He is not aware of any sick contacts.  Review of Systems      Past Medical History  Diagnosis Date  . Coronary artery disease   . Hypertension   . Hyperlipidemia   . Obesity   . Internal hemorrhoid   . Pericardial effusion   . Thyroid disease   . Back pain   . Anemia     iron def  . Thoracic aortic aneurysm   . Renal insufficiency 05/16/2011    Family History  Problem Relation Age of Onset  . Liver cancer Father     History   Social History  . Marital Status: Single    Spouse Name: N/A    Number of Children: 0  . Years of Education: N/A   Occupational History  . [employer]   Social History Main Topics  . Smoking status: Former Smoker    Quit date: 10/10/2001  . Smokeless tobacco: Never Used  . Alcohol Use: Yes     Comment: one drink per day  . Drug Use: No  . Sexually Active: Not Currently   Other Topics Concern  . Not on file   Social History Narrative  . No narrative on file    Allergies  Allergen Reactions  . Celecoxib      Constitutional: Positive headache, fatigue and fever. Denies abrupt weight changes.  HEENT: Denies eye redness, eye pain, pressure behind the eyes, facial pain, nasal congestion, ear pain, ringing in the ears, wax buildup, runny nose or bloody nose. Respiratory: Positive cough. Denies difficulty breathing or shortness of breath.  Cardiovascular: Denies chest pain, chest tightness, palpitations or swelling in the hands or feet.   No other specific complaints in a complete review of systems (except as listed in HPI above).  Objective:   BP 112/70  Pulse 92  Temp(Src) 99.5 F  (37.5 C) (Oral)  Ht 5\' 8"  (1.727 m)  Wt 230 lb (104.327 kg)  BMI 34.98 kg/m2  SpO2 95% Wt Readings from Last 3 Encounters:  10/11/12 230 lb (104.327 kg)  09/16/12 231 lb 6.4 oz (104.962 kg)  09/05/12 235 lb 8 oz (106.822 kg)     General: Appears his stated age, well developed, well nourished in NAD. HEENT: Head: normal shape and size; Eyes: sclera white, no icterus, conjunctiva pink, PERRLA and EOMs intact; Ears: Tm's gray and intact, normal light reflex; Nose: mucosa pink and moist, septum midline; Throat/Mouth: + PND. Teeth present, mucosa erythematous and moist, no exudate noted, no lesions or ulcerations noted.  Neck: Mild cervical lymphadenopathy. Neck supple, trachea midline. No massses, lumps or thyromegaly present.  Cardiovascular: Normal rate and rhythm. S1,S2 noted.  No murmur, rubs or gallops noted. No JVD or BLE edema. No carotid bruits noted. Pulmonary/Chest: Normal effort and positive vesicular breath sounds. No respiratory distress. No wheezes, rales or ronchi noted.      Assessment & Plan:   Acute Viral infection, likely influenza with cough, new onset with additional workup required:  Get some rest and drink plenty of water eRx for Tamiflu 75 mg BID x 5  days    RTC as needed or if symptoms persist.

## 2012-10-11 NOTE — Patient Instructions (Signed)
Influenza Facts  Flu (influenza) is a contagious respiratory illness caused by the influenza viruses. It can cause mild to severe illness. While most healthy people recover from the flu without specific treatment and without complications, older people, young children, and people with certain health conditions are at higher risk for serious complications from the flu, including death.  CAUSES    The flu virus is spread from person to person by respiratory droplets from coughing and sneezing.   A person can also become infected by touching an object or surface with a virus on it and then touching their mouth, eye or nose.   Adults may be able to infect others from 1 day before symptoms occur and up to 7 days after getting sick. So it is possible to give someone the flu even before you know you are sick and continue to infect others while you are sick.  SYMPTOMS    Fever (usually high).   Headache.   Tiredness (can be extreme).   Cough.   Sore throat.   Runny or stuffy nose.   Body aches.   Diarrhea and vomiting may also occur, particularly in children.   These symptoms are referred to as "flu-like symptoms". A lot of different illnesses, including the common cold, can have similar symptoms.  DIAGNOSIS    There are tests that can determine if you have the flu as long you are tested within the first 2 or 3 days of illness.   A doctor's exam and additional tests may be needed to identify if you have a disease that is a complicating the flu.  RISKS AND COMPLICATIONS   Some of the complications caused by the flu include:   Bacterial pneumonia or progressive pneumonia caused by the flu virus.   Loss of body fluids (dehydration).   Worsening of chronic medical conditions, such as heart failure, asthma, or diabetes.   Sinus problems and ear infections.  HOME CARE INSTRUCTIONS    Seek medical care early on.   If you are at high risk from complications of the flu, consult your health-care Breeona Waid as soon  as you develop flu-like symptoms. Those at high risk for complications include:   People 65 years or older.   People with chronic medical conditions, including diabetes.   Pregnant women.   Young children.   Your caregiver may recommend use of an antiviral medication to help treat the flu.   If you get the flu, get plenty of rest, drink a lot of liquids, and avoid using alcohol and tobacco.   You can take over-the-counter medications to relieve the symptoms of the flu if your caregiver approves. (Never give aspirin to children or teenagers who have flu-like symptoms, particularly fever).  PREVENTION   The single best way to prevent the flu is to get a flu vaccine each fall. Other measures that can help protect against the flu are:   Antiviral Medications   A number of antiviral drugs are approved for use in preventing the flu. These are prescription medications, and a doctor should be consulted before they are used.   Habits for Good Health   Cover your nose and mouth with a tissue when you cough or sneeze, throw the tissue away after you use it.   Wash your hands often with soap and water, especially after you cough or sneeze. If you are not near water, use an alcohol-based hand cleaner.   Avoid people who are sick.   If you get the   flu, stay home from work or school. Avoid contact with other people so that you do not make them sick, too.   Try not to touch your eyes, nose, or mouth as germs ore often spread this way.  IN CHILDREN, EMERGENCY WARNING SIGNS THAT NEED URGENT MEDICAL ATTENTION:   Fast breathing or trouble breathing.   Bluish skin color.   Not drinking enough fluids.   Not waking up or not interacting.   Being so irritable that the child does not want to be held.   Flu-like symptoms improve but then return with fever and worse cough.   Fever with a rash.  IN ADULTS, EMERGENCY WARNING SIGNS THAT NEED URGENT MEDICAL ATTENTION:   Difficulty breathing or shortness of breath.   Pain  or pressure in the chest or abdomen.   Sudden dizziness.   Confusion.   Severe or persistent vomiting.  SEEK IMMEDIATE MEDICAL CARE IF:   You or someone you know is experiencing any of the symptoms above. When you arrive at the emergency center,report that you think you have the flu. You may be asked to wear a mask and/or sit in a secluded area to protect others from getting sick.  MAKE SURE YOU:    Understand these instructions.   Monitor your condition.   Seek medical care if you are getting worse, or not improving.  Document Released: 07/20/2003 Document Revised: 10/09/2011 Document Reviewed: 04/15/2009  Exit[employer] Patient Information 2013 [employer].

## 2012-10-17 ENCOUNTER — Other Ambulatory Visit: Payer: Self-pay | Admitting: Otolaryngology

## 2012-10-17 DIAGNOSIS — H9042 Sensorineural hearing loss, unilateral, left ear, with unrestricted hearing on the contralateral side: Secondary | ICD-10-CM

## 2012-10-17 DIAGNOSIS — IMO0001 Reserved for inherently not codable concepts without codable children: Secondary | ICD-10-CM

## 2012-10-21 ENCOUNTER — Other Ambulatory Visit: Payer: BC Managed Care – PPO

## 2012-10-24 ENCOUNTER — Encounter: Payer: Self-pay | Admitting: Cardiovascular Disease

## 2012-10-24 ENCOUNTER — Ambulatory Visit (INDEPENDENT_AMBULATORY_CARE_PROVIDER_SITE_OTHER): Payer: BC Managed Care – PPO | Admitting: Cardiovascular Disease

## 2012-10-24 VITALS — BP 104/74 | HR 70 | Ht 68.0 in | Wt 227.0 lb

## 2012-10-24 DIAGNOSIS — I359 Nonrheumatic aortic valve disorder, unspecified: Secondary | ICD-10-CM

## 2012-10-24 DIAGNOSIS — I7101 Dissection of thoracic aorta: Secondary | ICD-10-CM

## 2012-10-24 NOTE — Patient Instructions (Addendum)

## 2012-10-24 NOTE — Progress Notes (Signed)
History of Present Illness: [name] with history of HTN, hyperlipidemia and ascending aortic aneursym dissection with repair at [facility] in 2003 with St. Jude aortic valve prosthesis on chronic coumadin therapy here today for cardiac follow up. His surgery was at [facility] in 2003. He has had a residual thoracic and abdominal aortic dissection since then. Imaging showed large dissection extending from ascending aorta down into the abdominal aorta and iliacs. The false lumen in the descending thoracic aortic aneurysm is larger than the true lumen. There is some involvement of the arch vessels and renal arteries. He was also found to have a right lung nodule and lesions in the liver and kidneys that were hard to characterize. Echo March 2012 with normal LV function, normal functioning AVR. The descending thoracic aorta had enlarged in size from 5.2 x 5.3 cm to 5.5 x 5.5 cm. Plans were in place for repeat CTA in March 2013 but he was called and did not call back to schedule. He is on chronic coumadin for anticoagulation after mechanical AVR. 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   He feels great. No chest pain, SOB, palpitations, near syncope, syncope. No leg pains. Tolerating all meds. He is seeing Nephrology, Dr. Lowell Guitar, next month for renal insufficiency. 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 Last creatinine was 1.6 08/30/12.   Primary Care Physician: Dr. Oliver Barre  Last Lipid Profile:Lipid Panel     Component Value Date/Time   CHOL 208* 08/30/2012 0802   TRIG 197.0* 08/30/2012 0802   HDL 40.00 08/30/2012 0802   CHOLHDL 5 08/30/2012 0802   VLDL 39.4 08/30/2012 0802     Past Medical History  Diagnosis Date  . Coronary artery disease   . Hypertension   . Hyperlipidemia   . Obesity   . Internal hemorrhoid   . Pericardial effusion   . Thyroid disease   . Back pain   . Anemia    iron def  . Thoracic aortic aneurysm   . Renal insufficiency 05/16/2011    Past Surgical History  Procedure Laterality Date  . Impingment      right shoulder  . Abdominal aortic aneurysm repair    . Aortic valve replacement    . Knee arthroscopy      left  . Wisdom tooth extraction      Current Outpatient Prescriptions  Medication Sig Dispense Refill  . fenofibrate 160 MG tablet TAKE 1 TABLET (160 MG TOTAL) DAILY  90 tablet  0  . ferrous sulfate 325 (65 FE) MG tablet Take 325 mg by mouth daily with breakfast.        . losartan (COZAAR) 50 MG tablet TAKE 1 TABLET DAILY (NO FURTHER REFILLS WITHOUT AN APPOINTMENT)  90 tablet  1  . metoprolol (TOPROL-XL) 200 MG 24 hr tablet TAKE 1 TABLET (200 MG TOTAL) DAILY  (MUST MAKE AN APPOINTMENT FOR FURTHER REFILLS)  30 tablet  0  . Multiple Vitamin (MULTIVITAMIN) tablet Take 1 tablet by mouth daily.        . rosuvastatin (CRESTOR) 40 MG tablet Take 1 tablet (40 mg total) by mouth daily.  90 tablet  3  . warfarin (COUMADIN) 5 MG tablet TAKE 1 TABLET (5 MG TOTAL) AS DIRECTED  100 tablet  0   No current facility-administered medications for this visit.    Allergies  Allergen Reactions  .  Celecoxib     History   Social History  . Marital Status: Single    Spouse Name: N/A    Number of Children: 0  . Years of Education: N/A   Occupational History  . [employer]   Social History Main Topics  . Smoking status: Former Smoker    Quit date: 10/10/2001  . Smokeless tobacco: Never Used  . Alcohol Use: Yes     Comment: one drink per day  . Drug Use: No  . Sexually Active: Not Currently   Other Topics Concern  . Not on file   Social History Narrative  . No narrative on file    Family History  Problem Relation Age of Onset  . Liver cancer Father     Review of Systems:  As stated in the HPI and otherwise negative.   BP 104/74  Pulse 70  Ht 5\' 8"  (1.727 m)  Wt 227 lb (102.967 kg)  BMI 34.52 kg/m2  Physical  Examination: General: Well developed, well nourished, NAD HEENT: OP clear, mucus membranes moist SKIN: warm, dry. No rashes. Neuro: No focal deficits Musculoskeletal: Muscle strength 5/5 all ext Psychiatric: Mood and affect normal Neck: No JVD, no carotid bruits, no thyromegaly, no lymphadenopathy. Lungs:Clear bilaterally, no wheezes, rhonci, crackles Cardiovascular: Regular rate and rhythm. Valve click with systolic murmur. No gallops or rubs. Abdomen:Soft. Bowel sounds present. Non-tender.  Extremities: No lower extremity edema. Pulses are 2 + in the bilateral DP/PT.  Assessment and Plan:   1. THORACIC AORTIC ANEURYSM: He is known to have type A aortic dissection with extension from the thoracic aorta through the iliacs involving the right renal artery. This has been stable. Last CTA was February 2012 as he missed appt for repeat CTA March 2013. We have discussed repeating his CTA but he has had worsening of his renal function over the lasg year. Will hold for now. He will see Dr. Lowell Guitar in Nephrology next month. I would like to order a CT angiogram if possible after renal evaluation. He could be admitted for pre-hydration before the CTA if needed. Will wait to hear from pt after renal workup and will order CTA at that time.     2. S/P aortic valve replacement: On chronic coumadin therapy. Stable.   He does have a new murmur. Will repeat echo.

## 2012-10-28 ENCOUNTER — Other Ambulatory Visit (HOSPITAL_COMMUNITY): Payer: Self-pay | Admitting: Cardiovascular Disease

## 2012-10-28 ENCOUNTER — Ambulatory Visit (HOSPITAL_COMMUNITY): Payer: BC Managed Care – PPO | Attending: Cardiology | Admitting: Radiology

## 2012-10-28 ENCOUNTER — Encounter (HOSPITAL_COMMUNITY): Payer: Self-pay | Admitting: Internal Medicine

## 2012-10-28 DIAGNOSIS — I2510 Atherosclerotic heart disease of native coronary artery without angina pectoris: Secondary | ICD-10-CM | POA: Insufficient documentation

## 2012-10-28 DIAGNOSIS — I359 Nonrheumatic aortic valve disorder, unspecified: Secondary | ICD-10-CM

## 2012-10-28 DIAGNOSIS — Z954 Presence of other heart-valve replacement: Secondary | ICD-10-CM | POA: Insufficient documentation

## 2012-10-28 DIAGNOSIS — I7103 Dissection of thoracoabdominal aorta: Secondary | ICD-10-CM | POA: Insufficient documentation

## 2012-10-28 DIAGNOSIS — I10 Essential (primary) hypertension: Secondary | ICD-10-CM | POA: Insufficient documentation

## 2012-10-28 DIAGNOSIS — Z952 Presence of prosthetic heart valve: Secondary | ICD-10-CM

## 2012-10-28 DIAGNOSIS — E669 Obesity, unspecified: Secondary | ICD-10-CM | POA: Insufficient documentation

## 2012-10-28 DIAGNOSIS — E785 Hyperlipidemia, unspecified: Secondary | ICD-10-CM | POA: Insufficient documentation

## 2012-10-28 NOTE — Progress Notes (Signed)
Echocardiogram performed.  

## 2012-11-11 ENCOUNTER — Other Ambulatory Visit: Payer: Self-pay | Admitting: Otolaryngology

## 2012-11-13 ENCOUNTER — Ambulatory Visit (INDEPENDENT_AMBULATORY_CARE_PROVIDER_SITE_OTHER): Payer: BC Managed Care – PPO | Admitting: General Practice

## 2012-11-13 DIAGNOSIS — I359 Nonrheumatic aortic valve disorder, unspecified: Secondary | ICD-10-CM

## 2012-11-13 DIAGNOSIS — Z954 Presence of other heart-valve replacement: Secondary | ICD-10-CM

## 2012-11-13 DIAGNOSIS — Z952 Presence of prosthetic heart valve: Secondary | ICD-10-CM

## 2012-11-16 ENCOUNTER — Other Ambulatory Visit: Payer: Self-pay | Admitting: Internal Medicine

## 2012-11-19 ENCOUNTER — Ambulatory Visit
Admission: RE | Admit: 2012-11-19 | Discharge: 2012-11-19 | Disposition: A | Discharge status: Home / Self Care | Payer: BC Managed Care – PPO | Source: Ambulatory Visit | Attending: Otolaryngology | Admitting: Otolaryngology

## 2012-11-19 DIAGNOSIS — H9042 Sensorineural hearing loss, unilateral, left ear, with unrestricted hearing on the contralateral side: Secondary | ICD-10-CM

## 2012-11-19 DIAGNOSIS — IMO0001 Reserved for inherently not codable concepts without codable children: Secondary | ICD-10-CM

## 2012-11-26 ENCOUNTER — Other Ambulatory Visit: Payer: Self-pay | Admitting: Otolaryngology

## 2012-11-26 DIAGNOSIS — H903 Sensorineural hearing loss, bilateral: Secondary | ICD-10-CM

## 2012-11-26 DIAGNOSIS — H905 Unspecified sensorineural hearing loss: Secondary | ICD-10-CM

## 2012-11-29 ENCOUNTER — Ambulatory Visit
Admission: RE | Admit: 2012-11-29 | Discharge: 2012-11-29 | Disposition: A | Discharge status: Home / Self Care | Payer: BC Managed Care – PPO | Source: Ambulatory Visit | Attending: Otolaryngology | Admitting: Otolaryngology

## 2012-11-29 DIAGNOSIS — H905 Unspecified sensorineural hearing loss: Secondary | ICD-10-CM

## 2012-11-29 DIAGNOSIS — H903 Sensorineural hearing loss, bilateral: Secondary | ICD-10-CM

## 2012-11-30 ENCOUNTER — Other Ambulatory Visit: Payer: Self-pay | Admitting: Internal Medicine

## 2012-12-12 ENCOUNTER — Encounter: Payer: Self-pay | Admitting: Internal Medicine

## 2012-12-25 ENCOUNTER — Ambulatory Visit (INDEPENDENT_AMBULATORY_CARE_PROVIDER_SITE_OTHER): Payer: BC Managed Care – PPO | Admitting: Family Medicine

## 2012-12-25 DIAGNOSIS — I359 Nonrheumatic aortic valve disorder, unspecified: Secondary | ICD-10-CM

## 2012-12-25 DIAGNOSIS — Z952 Presence of prosthetic heart valve: Secondary | ICD-10-CM

## 2012-12-25 DIAGNOSIS — Z954 Presence of other heart-valve replacement: Secondary | ICD-10-CM

## 2012-12-25 LAB — POCT INR: INR: 2.3

## 2012-12-27 ENCOUNTER — Encounter: Payer: Self-pay | Admitting: Diagnostic Neuroimaging

## 2012-12-27 ENCOUNTER — Ambulatory Visit (INDEPENDENT_AMBULATORY_CARE_PROVIDER_SITE_OTHER): Payer: BC Managed Care – PPO | Admitting: Diagnostic Neuroimaging

## 2012-12-27 VITALS — BP 135/83 | HR 70 | Temp 97.7°F | Ht 68.0 in | Wt 229.0 lb

## 2012-12-27 DIAGNOSIS — I6340 Cerebral infarction due to embolism of unspecified cerebral artery: Secondary | ICD-10-CM

## 2012-12-27 DIAGNOSIS — H905 Unspecified sensorineural hearing loss: Secondary | ICD-10-CM | POA: Insufficient documentation

## 2012-12-27 DIAGNOSIS — Z952 Presence of prosthetic heart valve: Secondary | ICD-10-CM | POA: Insufficient documentation

## 2012-12-27 DIAGNOSIS — I639 Cerebral infarction, unspecified: Secondary | ICD-10-CM

## 2012-12-27 NOTE — Patient Instructions (Signed)
I will refer to ophthalmology.

## 2012-12-27 NOTE — Progress Notes (Signed)
[facility]  PATIENT: Austin [name] DOB: 01-Jun-1956  REFERRING CLINICIAN: Emeline Darling HISTORY FROM: patient REASON FOR VISIT: new consult   HISTORICAL  CHIEF COMPLAINT:  Chief Complaint  Patient presents with  . 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  Around Christmas time, December 2013, patient woke up one morning with significant left-sided hearing loss. His hearing ability was replaced by the sound of "air-conditioner" running and whooshing sound. Patient went to his primary care doctor was treated with antibiotics for possible ear/sinus infection. Symptoms did not improve. He was referred to ENT who evaluated him, confirmed left-sided sensorineural hearing loss, and treated with course of steroids orally. No benefit. Patient was then treated with steroid injections and left ear without relief.   No vision changes, double vision, numbness, weakness, headache, confusion. Patient does report one week history of aphasia, postoperatively from aortic valve replacement in 2003.  REVIEW OF SYSTEMS: Full 14 system review of systems performed and notable only for hearing loss ringing in ears snoring allergies.  ALLERGIES: Allergies  Allergen Reactions  . Celecoxib     HOME MEDICATIONS: Outpatient Prescriptions Prior to Visit  Medication Sig Dispense Refill  . fenofibrate 160 MG tablet TAKE 1 TABLET DAILY (NO FURTHER REFILLS WITHOUT APPOINTMENT LAST OFFICE VISIT 05/2011 MUST MAKE APPOINTMENT)  90 tablet  2  . ferrous sulfate 325 (65 FE) MG tablet Take 325 mg by mouth daily with breakfast.        . losartan (COZAAR) 50 MG tablet TAKE 1 TABLET DAILY (NO FURTHER REFILLS WITHOUT AN APPOINTMENT)  90 tablet  1    . metoprolol (TOPROL-XL) 200 MG 24 hr tablet Take 1 tablet (200 mg total) by mouth daily.  30 tablet  10  . Multiple Vitamin (MULTIVITAMIN) tablet Take 1 tablet by mouth daily.        . rosuvastatin (CRESTOR) 40 MG tablet Take 1 tablet (40 mg total) by mouth daily.  90 tablet  3  . warfarin (COUMADIN) 5 MG tablet TAKE 1 TABLET AS DIRECTED  100 tablet  2   No facility-administered medications prior to visit.    PAST MEDICAL HISTORY: Past Medical History  Diagnosis Date  . Coronary artery disease   . Hypertension   . Hyperlipidemia   . Obesity   . Internal hemorrhoid   . Pericardial effusion   . Thyroid disease   . Back pain   . Anemia     iron def  . Thoracic aortic aneurysm   . Renal insufficiency 05/16/2011    PAST SURGICAL HISTORY: Past Surgical History  Procedure Laterality Date  . Impingment      right shoulder  . Abdominal aortic aneurysm repair    . Aortic valve replacement    . Knee arthroscopy      left  . Wisdom tooth extraction      FAMILY HISTORY: Family History  Problem Relation Age of Onset  . Liver cancer Father   . Hypertension      SOCIAL HISTORY:  History   Social History  . Marital Status: Single    Spouse Name: N/A    Number of Children: 0  . Years of Education: MDIV   Occupational History  . [employer]  .  Social History Main Topics  . Smoking status: Former Smoker -- 1.00 packs/day for 15 years    Types: Cigarettes    Quit date: 10/10/2001  . Smokeless tobacco: Never Used  . Alcohol Use: Yes     Comment: one drink per day  . Drug Use: No  . Sexually Active: Not Currently   Other Topics Concern  . Not on file   Social History Narrative   Pt lives at home alone.   Caffeine Use: Less than 1 cup daily.     PHYSICAL EXAM  Filed Vitals:   12/27/12 0902  BP: 135/83  Pulse: 70  Temp: 97.7 F (36.5 C)  TempSrc: Oral  Height: 5\' 8"  (1.727 m)  Weight: 229 lb (103.874 kg)    Not recorded    Body  mass index is 34.83 kg/(m^2).  GENERAL EXAM: Patient is in no distress  CARDIOVASCULAR: Regular rate and rhythm, SYSTOLIC MURMUR RADIATING TO RIGHT CAROTID; MECHANICAL CLICK  NEUROLOGIC: MENTAL STATUS: awake, alert, language fluent, comprehension intact, naming intact CRANIAL NERVE: no papilledema on fundoscopic exam, pupils equal and reactive to light, visual fields full to confrontation, extraocular muscles intact, no nystagmus, facial sensation and strength symmetric, uvula midline, shoulder shrug symmetric, tongue midline. WEBER LATERALIZES TO RIGHT. RINNE (RIGHT AC > BC; LEFT BC > AC) MOTOR: normal bulk and tone, full strength in the BUE, BLE SENSORY: normal and symmetric to light touch, pinprick, temperature, vibration COORDINATION: finger-nose-finger, fine finger movements normal REFLEXES: BUE 1, KNEES TRACE, RIGHT ANKLE 0, LEFT ANKLE 1. GAIT/STATION: narrow based gait; able to walk on toes, heels and tandem; romberg is negative   DIAGNOSTIC DATA (LABS, IMAGING, TESTING) - I reviewed patient records, labs, notes, testing and imaging myself where available.  Lab Results  Component Value Date   WBC 4.8 08/30/2012   HGB 16.3 08/30/2012   HCT 47.3 08/30/2012   MCV 84.9 08/30/2012   PLT 169.0 08/30/2012      Component Value Date/Time   NA 138 08/30/2012 0802   K 4.1 08/30/2012 0802   CL 105 08/30/2012 0802   CO2 28 08/30/2012 0802   GLUCOSE 114* 08/30/2012 0802   BUN 27* 08/30/2012 0802   CREATININE 1.6* 08/30/2012 0802   CALCIUM 9.3 08/30/2012 0802   PROT 7.0 08/30/2012 0802   ALBUMIN 4.7 08/30/2012 0802   AST 34 08/30/2012 0802   ALT 32 08/30/2012 0802   ALKPHOS 23* 08/30/2012 0802   BILITOT 1.2 08/30/2012 0802   GFRNONAA 75.73 04/15/2010 1233   GFRAA 68 11/21/2007 0749   Lab Results  Component Value Date   CHOL 208* 08/30/2012   HDL 40.00 08/30/2012   LDLDIRECT 128.2 08/30/2012   TRIG 197.0* 08/30/2012   CHOLHDL 5 08/30/2012   No results found for this basename: HGBA1C   No  results found for this basename: VITAMINB12   Lab Results  Component Value Date   TSH 2.89 08/30/2012    11/29/12 MRI brain and IAC (without) - no abnormalities of the left vestibulocochlear nerve, vestibular apparatus or brain stem. There were 2 chronic ischemic infarcts in the left frontal juxtacortical and right periventricular regions. Small left temporal cavernoma also identified. Mild chronic small vessel ischemic disease also noted. Mucosal thickening in the paranasal sinuses noted.  10/28/12 2D echocardiogram (TTE) -  - Left ventricle: The cavity size was normal. Wall thickness was increased in a pattern of mild LVH. Systolic function was normal. The estimated ejection fraction was in the range of 55% to 60%. Doppler  parameters are consistent with high ventricular filling pressure. - Aortic valve: A St. Jude Medical mechanical prosthesis was present. Trivial regurgitation. - Right atrium: The atrium was mildly dilated.  11/15/10 carotid u/s - normal   ASSESSMENT AND PLAN  57 y.o. year old male  has a past medical history of Coronary artery disease; Hypertension; Hyperlipidemia; Obesity; Internal hemorrhoid; Pericardial effusion; Thyroid disease; Back pain; Anemia; Thoracic aortic aneurysm; and Renal insufficiency (05/16/2011). here with new-onset left-sided sensorineural hearing loss in December 2013. Most likely this was related to viral or postviral phenomenon. MRI brain findings most likely represent chronic ischemic infarcts. His history of one week aphasia in 2003 postoperatively, could be explained by his left frontal lesion. The right periventricular lesion is likely asymptomatic.  Less likely explanation would be an autoimmune, vasculitis, or inflammatory etiology. There is a rare condition called Susac syndrome, which is a type of microangiopathy that can affect the brain, retina and cochlea. The patient's MRI of the brain is not exactly classic for this condition, but we can  evaluate for this further by referring patient to ophthalmology for detailed retinal exam to look for retinal abnormalities that are seen with Susac syndrome. If there is evidence for this condition, then we could consider longer term steroids or IVIG.  PLAN: 1. Stroke risk factor management (coumadin, crestor, BP control) 2. Refer to ophthalmology for detailed retinal exam (evaluate for Susac syndrome) 3. Follow up in 6 months   Orders Placed This Encounter  Procedures  . Ambulatory referral to Ophthalmology     Suanne Marker, MD 12/27/2012, 10:13 AM Certified in Neurology, Neurophysiology and Neuroimaging  [facility] [address], [address] [place], [place] [address] [phone]

## 2013-02-12 ENCOUNTER — Ambulatory Visit (INDEPENDENT_AMBULATORY_CARE_PROVIDER_SITE_OTHER): Payer: BC Managed Care – PPO | Admitting: General Practice

## 2013-02-12 DIAGNOSIS — Z952 Presence of prosthetic heart valve: Secondary | ICD-10-CM

## 2013-02-12 DIAGNOSIS — Z954 Presence of other heart-valve replacement: Secondary | ICD-10-CM

## 2013-02-12 DIAGNOSIS — I359 Nonrheumatic aortic valve disorder, unspecified: Secondary | ICD-10-CM

## 2013-02-12 LAB — POCT INR: INR: 2.5

## 2013-03-28 ENCOUNTER — Ambulatory Visit (INDEPENDENT_AMBULATORY_CARE_PROVIDER_SITE_OTHER): Payer: BC Managed Care – PPO | Admitting: General Practice

## 2013-03-28 DIAGNOSIS — Z954 Presence of other heart-valve replacement: Secondary | ICD-10-CM

## 2013-03-28 DIAGNOSIS — I359 Nonrheumatic aortic valve disorder, unspecified: Secondary | ICD-10-CM

## 2013-03-28 DIAGNOSIS — Z952 Presence of prosthetic heart valve: Secondary | ICD-10-CM

## 2013-04-14 ENCOUNTER — Other Ambulatory Visit: Payer: Self-pay | Admitting: *Deleted

## 2013-04-14 MED ORDER — LOSARTAN POTASSIUM 50 MG PO TABS: ORAL_TABLET | ORAL | Status: DC

## 2013-04-22 ENCOUNTER — Ambulatory Visit (INDEPENDENT_AMBULATORY_CARE_PROVIDER_SITE_OTHER): Payer: BC Managed Care – PPO | Admitting: Cardiovascular Disease

## 2013-04-22 ENCOUNTER — Encounter: Payer: Self-pay | Admitting: Cardiovascular Disease

## 2013-04-22 VITALS — BP 101/73 | HR 58 | Ht 68.0 in | Wt 211.0 lb

## 2013-04-22 DIAGNOSIS — I7101 Dissection of thoracic aorta: Secondary | ICD-10-CM

## 2013-04-22 DIAGNOSIS — I359 Nonrheumatic aortic valve disorder, unspecified: Secondary | ICD-10-CM

## 2013-04-22 NOTE — Progress Notes (Signed)
History of Present Illness: [name] with history of HTN, hyperlipidemia and ascending aortic aneursym dissection with repair at [facility] in 2003 with St. Jude aortic valve prosthesis on chronic coumadin therapy here today for cardiac follow up. His surgery was at [facility] in 2003. He has had a residual thoracic and abdominal aortic dissection since then. Imaging showed large dissection extending from ascending aorta down into the abdominal aorta and iliacs. The false lumen in the descending thoracic aortic aneurysm is larger than the true lumen. There is some involvement of the arch vessels and renal arteries. He was also found to have a right lung nodule and lesions in the liver and kidneys that were hard to characterize. Echo March 2012 with normal LV function, normal functioning AVR. The descending thoracic aorta had enlarged in size from 5.2 x 5.3 cm to 5.5 x 5.5 cm. Plans were in place for repeat CTA in March 2013 but he was called and did not call back to schedule. He is on chronic coumadin for anticoagulation after mechanical AVR. 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   He feels great. No chest pain, SOB, palpitations, near syncope, syncope. No leg pains. Tolerating all meds.   Primary Care Physician: Dr. Oliver Barre  Last Lipid Profile:Lipid Panel     Component Value Date/Time   CHOL 208* 08/30/2012 0802   TRIG 197.0* 08/30/2012 0802   HDL 40.00 08/30/2012 0802   CHOLHDL 5 08/30/2012 0802   VLDL 39.4 08/30/2012 0802     Past Medical History  Diagnosis Date  . Coronary artery disease   . Hypertension   . Hyperlipidemia   . Obesity   . Internal hemorrhoid   . Pericardial effusion   . Thyroid disease   . Back pain   . Anemia     iron def  . Thoracic aortic aneurysm   . Renal insufficiency 05/16/2011    Past Surgical History  Procedure Laterality Date  . Impingment      right shoulder  . Abdominal aortic aneurysm  repair    . Aortic valve replacement    . Knee arthroscopy      left  . Wisdom tooth extraction      Current Outpatient Prescriptions  Medication Sig Dispense Refill  . fenofibrate 160 MG tablet TAKE 1 TABLET DAILY (NO FURTHER REFILLS WITHOUT APPOINTMENT LAST OFFICE VISIT 05/2011 MUST MAKE APPOINTMENT)  90 tablet  2  . ferrous sulfate 325 (65 FE) MG tablet Take 325 mg by mouth daily with breakfast.        . losartan (COZAAR) 50 MG tablet TAKE 1 TABLET DAILY  90 tablet  1  . metoprolol (TOPROL-XL) 200 MG 24 hr tablet Take 1 tablet (200 mg total) by mouth daily.  30 tablet  10  . Multiple Vitamin (MULTIVITAMIN) tablet Take 1 tablet by mouth daily.        . rosuvastatin (CRESTOR) 40 MG tablet Take 1 tablet (40 mg total) by mouth daily.  90 tablet  3  . warfarin (COUMADIN) 5 MG tablet TAKE 1 TABLET AS DIRECTED  100 tablet  2   No current facility-administered medications for this visit.    Allergies  Allergen Reactions  . Celecoxib     History   Social History  . Marital Status: Single    Spouse Name: N/A    Number of Children: 0  . Years of Education: MDIV  Occupational History  . [employer]  .     Social History Main Topics  . Smoking status: Former Smoker -- 1.00 packs/day for 15 years    Types: Cigarettes    Quit date: 10/10/2001  . Smokeless tobacco: Never Used  . Alcohol Use: Yes     Comment: one drink per day  . Drug Use: No  . Sexual Activity: Not Currently   Other Topics Concern  . Not on file   Social History Narrative   Pt lives at home alone.   Caffeine Use: Less than 1 cup daily.    Family History  Problem Relation Age of Onset  . Liver cancer Father   . Hypertension      Review of Systems:  As stated in the HPI and otherwise negative.   BP 101/73  Pulse 58  Ht 5\' 8"  (1.727 m)  Wt 211 lb (95.709 kg)  BMI 32.09 kg/m2  Physical Examination: General: Well developed, well nourished, NAD HEENT: OP clear, mucus membranes  moist SKIN: warm, dry. No rashes. Neuro: No focal deficits Musculoskeletal: Muscle strength 5/5 all ext Psychiatric: Mood and affect normal Neck: No JVD, loud sounds bilateral carotids, no thyromegaly, no lymphadenopathy. Lungs:Clear bilaterally, no wheezes, rhonci, crackles Cardiovascular: Regular rate and rhythm. Valve click with systolic murmur. No gallops or rubs. Abdomen:Soft. Bowel sounds present. Non-tender.  Extremities: No lower extremity edema. Pulses are 2 + in the bilateral DP/PT.  Echo 10/28/12: Left ventricle: The cavity size was normal. Wall thickness was increased in a pattern of mild LVH. Systolic function was normal. The estimated ejection fraction was in the range of 55% to 60%. Doppler parameters are consistent with high ventricular filling pressure. - Aortic valve: A St. Jude Medical mechanical prosthesis was present. Trivial regurgitation. - Right atrium: The atrium was mildly dilated.  Assessment and Plan:   1. THORACIC AORTIC ANEURYSM: He is known to have type A aortic dissection with extension from the thoracic aorta through the iliacs involving the right renal artery. This has been stable. Last CTA was February 2012 as he missed appt for repeat CTA March 2013. He has seen Nephrology and it is felt that his renal function is stable for a CT angiogram with recommendation for admission the day before for pre-hydration. Will plan admission to [facility] on 05/15/13  for hydration before CTA chest/abdomen/pelvis which will be performed on 05/16/13.   2. S/P aortic valve replacement: On chronic coumadin therapy. Stable. He will continue antibiotic prophylaxis before dental visits.

## 2013-04-22 NOTE — Patient Instructions (Addendum)
Your physician wants you to follow-up in:  6 months.  You will receive a reminder letter in the mail two months in advance. If you don't receive a letter, please call our office to schedule the follow-up appointment.  The admitting office at [facility] will call you on May 15, 2013 with room assignment for admission to the hospital

## 2013-05-13 ENCOUNTER — Ambulatory Visit (INDEPENDENT_AMBULATORY_CARE_PROVIDER_SITE_OTHER): Payer: BC Managed Care – PPO | Admitting: General Practice

## 2013-05-13 DIAGNOSIS — Z954 Presence of other heart-valve replacement: Secondary | ICD-10-CM

## 2013-05-13 DIAGNOSIS — Z23 Encounter for immunization: Secondary | ICD-10-CM

## 2013-05-13 DIAGNOSIS — I359 Nonrheumatic aortic valve disorder, unspecified: Secondary | ICD-10-CM

## 2013-05-13 DIAGNOSIS — Z952 Presence of prosthetic heart valve: Secondary | ICD-10-CM

## 2013-05-15 ENCOUNTER — Observation Stay: Admission: AD | Admit: 2013-05-15 | Payer: Self-pay | Source: Ambulatory Visit | Admitting: Cardiovascular Disease

## 2013-05-15 ENCOUNTER — Observation Stay (HOSPITAL_COMMUNITY)
Admission: AD | Admit: 2013-05-15 | Discharge: 2013-05-16 | Disposition: A | Discharge status: Home / Self Care | Payer: BC Managed Care – PPO | Source: Ambulatory Visit | Attending: Cardiovascular Disease | Admitting: Cardiovascular Disease

## 2013-05-15 ENCOUNTER — Encounter (HOSPITAL_COMMUNITY): Payer: Self-pay | Admitting: General Practice

## 2013-05-15 DIAGNOSIS — Z09 Encounter for follow-up examination after completed treatment for conditions other than malignant neoplasm: Principal | ICD-10-CM | POA: Insufficient documentation

## 2013-05-15 DIAGNOSIS — I7101 Dissection of thoracic aorta: Secondary | ICD-10-CM | POA: Insufficient documentation

## 2013-05-15 DIAGNOSIS — Z9889 Other specified postprocedural states: Secondary | ICD-10-CM | POA: Insufficient documentation

## 2013-05-15 DIAGNOSIS — I71019 Dissection of thoracic aorta, unspecified: Secondary | ICD-10-CM | POA: Insufficient documentation

## 2013-05-15 DIAGNOSIS — Z7901 Long term (current) use of anticoagulants: Secondary | ICD-10-CM | POA: Insufficient documentation

## 2013-05-15 DIAGNOSIS — E785 Hyperlipidemia, unspecified: Secondary | ICD-10-CM | POA: Insufficient documentation

## 2013-05-15 DIAGNOSIS — I129 Hypertensive chronic kidney disease with stage 1 through stage 4 chronic kidney disease, or unspecified chronic kidney disease: Secondary | ICD-10-CM | POA: Insufficient documentation

## 2013-05-15 DIAGNOSIS — N189 Chronic kidney disease, unspecified: Secondary | ICD-10-CM | POA: Diagnosis present

## 2013-05-15 DIAGNOSIS — Z952 Presence of prosthetic heart valve: Secondary | ICD-10-CM

## 2013-05-15 DIAGNOSIS — I712 Thoracic aortic aneurysm, without rupture, unspecified: Secondary | ICD-10-CM | POA: Insufficient documentation

## 2013-05-15 DIAGNOSIS — Z954 Presence of other heart-valve replacement: Secondary | ICD-10-CM

## 2013-05-15 DIAGNOSIS — N183 Chronic kidney disease, stage 3 unspecified: Secondary | ICD-10-CM | POA: Diagnosis present

## 2013-05-15 DIAGNOSIS — I10 Essential (primary) hypertension: Secondary | ICD-10-CM | POA: Diagnosis present

## 2013-05-15 HISTORY — DX: Presence of prosthetic heart valve: Z95.2

## 2013-05-15 LAB — CBC WITH DIFFERENTIAL/PLATELET
Basophils Absolute: 0 10*3/uL (ref 0.0–0.1)
Eosinophils Absolute: 0.1 10*3/uL (ref 0.0–0.7)
Eosinophils Relative: 3 % (ref 0–5)
Lymphocytes Relative: 33 % (ref 12–46)
MCH: 29 pg (ref 26.0–34.0)
MCV: 81.5 fL (ref 78.0–100.0)
Platelets: 153 10*3/uL (ref 150–400)
RBC: 5.04 MIL/uL (ref 4.22–5.81)
RDW: 13 % (ref 11.5–15.5)
WBC: 3.9 10*3/uL — ABNORMAL LOW (ref 4.0–10.5)

## 2013-05-15 LAB — PROTIME-INR
INR: 2.54 — ABNORMAL HIGH (ref 0.00–1.49)
Prothrombin Time: 26.5 s — ABNORMAL HIGH (ref 11.6–15.2)

## 2013-05-15 LAB — BASIC METABOLIC PANEL
CO2: 24 mEq/L (ref 19–32)
Calcium: 9.1 mg/dL (ref 8.4–10.5)
Creatinine, Ser: 1.54 mg/dL — ABNORMAL HIGH (ref 0.50–1.35)
GFR calc Af Amer: 56 mL/min — ABNORMAL LOW (ref >90)
Glucose, Bld: 92 mg/dL (ref 70–99)

## 2013-05-15 MED ORDER — NITROGLYCERIN 0.4 MG SL SUBL: 0.4000 mg | SUBLINGUAL_TABLET | SUBLINGUAL | Status: DC | PRN

## 2013-05-15 MED ORDER — ZOLPIDEM TARTRATE 5 MG PO TABS: 5.0000 mg | ORAL_TABLET | Freq: Every evening | ORAL | Status: DC | PRN

## 2013-05-15 MED ORDER — ATORVASTATIN CALCIUM 80 MG PO TABS
80.0000 mg | ORAL_TABLET | Freq: Every day | ORAL | Status: DC
  Filled 2013-05-15: qty 1

## 2013-05-15 MED ORDER — ACETAMINOPHEN 325 MG PO TABS: 650.0000 mg | ORAL_TABLET | ORAL | Status: DC | PRN

## 2013-05-15 MED ORDER — ADULT MULTIVITAMIN W/MINERALS CH
1.0000 | ORAL_TABLET | Freq: Every day | ORAL | Status: DC
  Filled 2013-05-15: qty 1

## 2013-05-15 MED ORDER — ENOXAPARIN SODIUM 40 MG/0.4ML ~~LOC~~ SOLN: 40.0000 mg | SUBCUTANEOUS | Status: DC

## 2013-05-15 MED ORDER — ONDANSETRON HCL 4 MG/2ML IJ SOLN: 4.0000 mg | Freq: Four times a day (QID) | INTRAMUSCULAR | Status: DC | PRN

## 2013-05-15 MED ORDER — SODIUM CHLORIDE 0.9 % IV SOLN
INTRAVENOUS | Status: DC
  Administered 2013-05-15 – 2013-05-16 (×2): via INTRAVENOUS

## 2013-05-15 MED ORDER — LOSARTAN POTASSIUM 50 MG PO TABS: 50.0000 mg | ORAL_TABLET | Freq: Every day | ORAL | Status: DC

## 2013-05-15 MED ORDER — FENOFIBRATE 160 MG PO TABS
160.0000 mg | ORAL_TABLET | Freq: Every day | ORAL | Status: DC
  Filled 2013-05-15 (×2): qty 1

## 2013-05-15 MED ORDER — ALPRAZOLAM 0.25 MG PO TABS: 0.2500 mg | ORAL_TABLET | Freq: Two times a day (BID) | ORAL | Status: DC | PRN

## 2013-05-15 MED ORDER — FERROUS SULFATE 325 (65 FE) MG PO TABS
325.0000 mg | ORAL_TABLET | Freq: Every day | ORAL | Status: DC
  Filled 2013-05-15 (×3): qty 1

## 2013-05-15 MED ORDER — METOPROLOL SUCCINATE ER 100 MG PO TB24
200.0000 mg | ORAL_TABLET | Freq: Every day | ORAL | Status: DC
  Filled 2013-05-15: qty 2

## 2013-05-15 NOTE — Progress Notes (Signed)
ANTICOAGULATION CONSULT NOTE - Initial Consult  Pharmacy Consult for Coumadin Indication: mechanical valve  Allergies  Allergen Reactions  . Celecoxib Itching    Patient Measurements: Height: 5\' 8"  (172.7 cm) Weight: 199 lb 8.3 oz (90.5 kg) IBW/kg (Calculated) : 68.4  Vital Signs: Temp: 98.1 F (36.7 C) (10/16 1410) Temp src: Oral (10/16 1410) BP: 120/78 mmHg (10/16 1410) Pulse Rate: 52 (10/16 1410)  Labs:  Recent Labs  05/13/13 0932 05/15/13 1355  HGB  --  14.6  HCT  --  41.1  PLT  --  153  LABPROT  --  26.5*  INR 2.5 2.54*  CREATININE  --  1.54*    Estimated Creatinine Clearance: 57.1 ml/min (by C-G formula based on Cr of 1.54).   Medical History: Past Medical History  Diagnosis Date  . Coronary artery disease   . Hypertension   . Hyperlipidemia   . Obesity   . Internal hemorrhoid   . Pericardial effusion   . Thyroid disease   . Back pain   . Anemia     iron def  . Thoracic aortic aneurysm     a. s/p dissection and repair @ [facility] 2003 with SJM AVR;  CTA 09/2010 desc thor Ao 5.2 x 5.3-5.5 x 5.5 cm.  . Renal insufficiency 05/16/2011  . Aortic valve prosthesis present     a. SJM - [facility] 2003, chronic coumadin.    Medications:  Prescriptions prior to admission  Medication Sig Dispense Refill  . fenofibrate 160 MG tablet Take 160 mg by mouth daily.      . ferrous sulfate 325 (65 FE) MG tablet Take 325 mg by mouth daily with breakfast.        . losartan (COZAAR) 50 MG tablet Take 50 mg by mouth daily.      . metoprolol (TOPROL-XL) 200 MG 24 hr tablet Take 1 tablet (200 mg total) by mouth daily.  30 tablet  10  . Multiple Vitamin (MULTIVITAMIN) tablet Take 1 tablet by mouth daily.        . rosuvastatin (CRESTOR) 40 MG tablet Take 1 tablet (40 mg total) by mouth daily.  90 tablet  3  . warfarin (COUMADIN) 5 MG tablet Take 5 mg by mouth daily.        Assessment: 57 yo M with a hx of thoracic aortic dissection and mechanical aortic valve replacement.  He  has renal insufficiency and is admitted for hydration prior to a schedule CT angiogram to follow-up the dissection.  The CTA is scheduled for 05/16/13.  Pt has been on Coumadin for valve replacement with therapeutic INR.  Home Coumadin regimen =  5 mg PO daily.  Pt has taken dose today.  Goal of Therapy:  INR 2.0-3.0   Plan:  No further Coumadin tonight. Daily INR.  [employer], Pharm.D., BCPS Clinical Pharmacist Pager [phone] 05/15/2013 3:53 PM

## 2013-05-15 NOTE — Progress Notes (Signed)
Pt admitted to the unit from home. Pt alert and oriented x4. Ambulatory. Pt denies any pain. VS stable. No skin issues present. Pt states he was told to come to the hospital for fluids. MD was paged for admitting orders. Pt oriented to the unit and staff. Will cont to monitor.

## 2013-05-15 NOTE — H&P (Signed)
History and Physical   Patient ID: Austin [name] MRN: [id number], DOB/AGE: 1955-11-03 57 y.o. Date of Encounter: 05/15/2013  Primary Physician: Oliver Barre, MD Primary Cardiologist: CM  Chief Complaint:  Thoracic Ao Dissection  HPI: Austin [name] is a 57 y.o. male with a history of Thoracic Ao dissection and AOVR. He needs a CTA of the chest to follow the dissection.   His renal function has been abnormal and he is followed by nephrology. He has seen Nephrology and it is felt that his renal function is stable for a CT angiogram with recommendation for admission the day before for pre-hydration. He is scheduled for the CTA on 10/17 and is here today for admission and pre-procedure hydration.   He is not having any problems with bleeding, SOB, weakness or fatigue. He never gets chest pain. His activity level has been as usual recently, without difficulty.  Past Medical History  Diagnosis Date  . Coronary artery disease   . Hypertension   . Hyperlipidemia   . Obesity   . Internal hemorrhoid   . Pericardial effusion   . Thyroid disease   . Back pain   . Anemia     iron def  . Thoracic aortic aneurysm     a. s/p dissection and repair @ [facility] 2003 with SJM AVR;  CTA 09/2010 desc thor Ao 5.2 x 5.3-5.5 x 5.5 cm.  . Renal insufficiency 05/16/2011  . Aortic valve prosthesis present     a. SJM - [facility] 2003, chronic coumadin.    Surgical History:  Past Surgical History  Procedure Laterality Date  . Impingment      right shoulder  . Abdominal aortic aneurysm repair    . Aortic valve replacement    . Knee arthroscopy      left  . Wisdom tooth extraction       I have reviewed the patient's current medications. Prior to Admission medications   Medication Sig Start Date End Date Taking? Authorizing Provider  fenofibrate 160 MG tablet Take 160 mg by mouth daily.   Yes Historical Provider, MD  ferrous sulfate 325 (65 FE) MG tablet Take 325 mg by mouth daily with breakfast.      Yes Historical Provider, MD  losartan (COZAAR) 50 MG tablet Take 50 mg by mouth daily.   Yes Historical Provider, MD  metoprolol (TOPROL-XL) 200 MG 24 hr tablet Take 1 tablet (200 mg total) by mouth daily. 11/16/12  Yes Corwin Levins, MD  Multiple Vitamin (MULTIVITAMIN) tablet Take 1 tablet by mouth daily.     Yes Historical Provider, MD  rosuvastatin (CRESTOR) 40 MG tablet Take 1 tablet (40 mg total) by mouth daily. 07/15/12 07/15/13 Yes Corwin Levins, MD  warfarin (COUMADIN) 5 MG tablet Take 5 mg by mouth daily.   Yes Historical Provider, MD   Allergies:  Allergies  Allergen Reactions  . Celecoxib Itching    History   Social History  . Marital Status: Single    Spouse Name: N/A    Number of Children: 0  . Years of Education: MDIV   Occupational History  . [employer]  .     Social History Main Topics  . Smoking status: Former Smoker -- 1.00 packs/day for 15 years    Types: Cigarettes    Quit date: 10/10/2001  . Smokeless tobacco: Never Used  . Alcohol Use: Yes     Comment: one drink per day  . Drug Use: No  .  Sexual Activity: Not Currently   Other Topics Concern  . Not on file   Social History Narrative   Pt lives at home alone.   Caffeine Use: Less than 1 cup daily.    Family History  Problem Relation Age of Onset  . Liver cancer Father   . Hypertension     Family Status  Relation Status Death Age  . Mother Alive   . Father Alive     Review of Systems:   Full 14-point review of systems otherwise negative except as noted above.  Physical Exam: Blood pressure 124/80, pulse 53, temperature 97.8 F (36.6 C), temperature source Oral, resp. rate 18, height 5\' 8"  (1.727 m), weight 199 lb 8.3 oz (90.5 kg), SpO2 97.00%. General: Well developed, well nourished,male in no acute distress. Head: Normocephalic, atraumatic, sclera non-icteric, no xanthomas, nares are without discharge. Dentition: moderate Neck: No carotid bruits (radiation of murmur  bilaterally). JVD not elevated. No thyromegally Lungs: Good expansion bilaterally. without wheezes or rhonchi. CTA bilaterally Heart: Regular rate and rhythm with S1 S2.  No S3 or S4.  3/6 murmur, no rubs, or gallops appreciated. Crisp valve click. Abdomen: Soft, non-tender, non-distended with normoactive bowel sounds. No hepatomegaly. No rebound/guarding. No obvious abdominal masses. Msk:  Strength and tone appear normal for age. No joint deformities or effusions, no spine or costo-vertebral angle tenderness. Extremities: No clubbing or cyanosis. No edema.  Distal pedal pulses are 2+ in 4 extrem Neuro: Alert and oriented X 3. Moves all extremities spontaneously. No focal deficits noted. Psych:  Responds to questions appropriately with a normal affect. Skin: No rashes or lesions noted  Labs:   Lab Results  Component Value Date   WBC 4.8 08/30/2012   HGB 16.3 08/30/2012   HCT 47.3 08/30/2012   MCV 84.9 08/30/2012   PLT 169.0 08/30/2012   No results found for this basename: NA, K, CL, CO2, BUN, CREATININE, CALCIUM, LABALBU, PROT, BILITOT, ALKPHOS, ALT, AST, GLUCOSE,  in the last 168 hours   Recent Labs  05/13/13 0932  INR 2.5   08/30/2012 NA+ 138  135 - 145 mEq/L Final   08/30/2012   K+ 4.1  3.5 - 5.1 mEq/L Final   08/30/2012  CL 105  96 - 112 mEq/L Final   08/30/2012  CO2 28  19 - 32 mEq/L Final   08/30/2012   BUN  27* 6 - 23 mg/dL Final   4/54/0981   Cr 1.6* 0.4 - 1.5 mg/dL Final   Lab Results  Component Value Date   CHOL 208* 08/30/2012   HDL 40.00 08/30/2012   TRIG 197.0* 08/30/2012   ASSESSMENT AND PLAN:  Principal Problem:   THORACIC AORTIC ANEURYSM - CTA in am to follow  Active Problems:   CKD (chronic kidney disease) - check BMET now and in am    HYPERTENSION -  Continue current meds    HYPERLIPIDEMIA - see profile from Jan. Will recheck in am, may need meds.   SignedTheodore Demark, PA-C 05/15/2013 2:07 PM Beeper [phone]   History and all data above  reviewed.  Patient examined.  I agree with the findings as above.  The patient has no acute complatins.  He denies any chest pain.  The patient exam reveals COR:RRR, 2/6 systolic murmur with mechanical S2  ,  Lungs: Clear  ,  Abd: Positive bowel sounds, no rebound no guarding, Ext No edema  .  All available labs, radiology testing, previous records reviewed. Agree with documented assessment and  plan. The patient will be hydrated.  I will hold the ARB for one or two doses.    Austin [name]  3:13 PM  05/15/2013

## 2013-05-16 ENCOUNTER — Observation Stay (HOSPITAL_COMMUNITY): Payer: BC Managed Care – PPO

## 2013-05-16 ENCOUNTER — Encounter (HOSPITAL_COMMUNITY): Payer: Self-pay | Admitting: *Deleted

## 2013-05-16 DIAGNOSIS — I712 Thoracic aortic aneurysm, without rupture, unspecified: Secondary | ICD-10-CM

## 2013-05-16 DIAGNOSIS — Z7901 Long term (current) use of anticoagulants: Secondary | ICD-10-CM

## 2013-05-16 LAB — BASIC METABOLIC PANEL
BUN: 17 mg/dL (ref 6–23)
Chloride: 108 mEq/L (ref 96–112)
Creatinine, Ser: 1.35 mg/dL (ref 0.50–1.35)
GFR calc Af Amer: 65 mL/min — ABNORMAL LOW (ref >90)
GFR calc non Af Amer: 56 mL/min — ABNORMAL LOW (ref >90)
Glucose, Bld: 77 mg/dL (ref 70–99)
Potassium: 4.1 mEq/L (ref 3.5–5.1)

## 2013-05-16 MED ORDER — WARFARIN - PHARMACIST DOSING INPATIENT: Freq: Every day | Status: DC

## 2013-05-16 MED ORDER — LOSARTAN POTASSIUM 50 MG PO TABS: 50.0000 mg | ORAL_TABLET | Freq: Every day | ORAL | Status: DC

## 2013-05-16 MED ORDER — IOHEXOL 350 MG/ML SOLN
100.0000 mL | Freq: Once | INTRAVENOUS | Status: AC | PRN
  Administered 2013-05-16: 100 mL via INTRAVENOUS

## 2013-05-16 MED ORDER — WARFARIN SODIUM 5 MG PO TABS
5.0000 mg | ORAL_TABLET | Freq: Once | ORAL | Status: DC
  Filled 2013-05-16: qty 1

## 2013-05-16 NOTE — Progress Notes (Signed)
     SUBJECTIVE: No complaints this am.   BP 113/69  Pulse 52  Temp(Src) 97.6 F (36.4 C) (Oral)  Resp 14  Ht 5\' 8"  (1.727 m)  Wt 205 lb 9.6 oz (93.26 kg)  BMI 31.27 kg/m2  SpO2 98%  Intake/Output Summary (Last 24 hours) at 05/16/13 0704 Last data filed at 05/15/13 1800  Gross per 24 hour  Intake    480 ml  Output      0 ml  Net    480 ml    PHYSICAL EXAM General: Well developed, well nourished, in no acute distress. Alert and oriented x 3.  Psych:  Good affect, responds appropriately Neck: No JVD. No masses noted.  Lungs: Clear bilaterally with no wheezes or rhonci noted.  Heart: RRR with systolic murmur noted. Abdomen: Bowel sounds are present. Soft, non-tender.  Extremities: No lower extremity edema.   LABS: Basic Metabolic Panel:  Recent Labs  40/98/11 1355  NA 142  K 4.2  CL 110  CO2 24  GLUCOSE 92  BUN 21  CREATININE 1.54*  CALCIUM 9.1   CBC:  Recent Labs  05/15/13 1355  WBC 3.9*  NEUTROABS 2.0  HGB 14.6  HCT 41.1  MCV 81.5  PLT 153   Current Meds: . atorvastatin  80 mg Oral q1800  . fenofibrate  160 mg Oral Daily  . ferrous sulfate  325 mg Oral Q breakfast  . metoprolol  200 mg Oral Daily  . multivitamin with minerals  1 tablet Oral Daily     ASSESSMENT AND PLAN:  1. Thoracic aortic/abdominal aortic dissection:  Pt admitted yesterday for hydration before planned CTA chest/abdomen to evaluate his thoracic aortic and abdominal aortic dissection. Will discharge later today after his scans.   Chaddrick Brue  10/17/20147:04 AM

## 2013-05-16 NOTE — Discharge Summary (Signed)
CARDIOLOGY DISCHARGE SUMMARY   Patient ID: Austin [name] MRN: [id number] DOB/AGE: [birth year] 57 y.o.  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 male with a history of AOVR and thoracic aortic dissection. He has been routinely followed by serial CT scans. However, his kidney function had worsened. He is followed by nephrology, who recommended he come in the night before his procedure for hydration. He came to the hospital and was admitted 10/16 for his CT scan 10/17.  His ARB was held. He was continued on his other home medications. His renal function was checked on admission and the day of the scan. His BUN/creatinine were 21/1.54 on admission, 17/1.35 prior to the scan. His INR was therapeutic on admission and followed by the pharmacist while hospitalized.   The CT scans were performed, results pending. Once the scan was performed, no further inpatient workup was indicated. Father Dejaynes is considered stable for discharge, to follow up as an outpatient. He is to continue all previous medications, but is to hold the ARB for 48 hours, post-CT.  Labs:  Lab Results  Component Value Date   WBC 3.9* 05/15/2013   HGB 14.6 05/15/2013   HCT 41.1 05/15/2013   MCV 81.5 05/15/2013   PLT 153 05/15/2013     Recent Labs Lab 05/16/13 0647  NA 140  K 4.1  CL 108  CO2 22  BUN 17  CREATININE 1.35  CALCIUM 8.7  GLUCOSE 77    Recent Labs  05/16/13 0647  INR 2.58*      Radiology: pending  EKG: 15-May-2013 13:48 Sinus bradycardia Non-specific intra-ventricular conduction delay Nonspecific ST and T wave abnormality Vent. rate 50 BPM PR interval 156 ms QRS duration 118  ms QT/QTc 456/415 ms P-R-T axes 57 -13 87   FOLLOW UP PLANS AND APPOINTMENTS Allergies  Allergen Reactions  . Celecoxib Itching     Medication List         fenofibrate 160 MG tablet  Take 160 mg by mouth daily.     ferrous sulfate 325 (65 FE) MG tablet  Take 325 mg by mouth daily with breakfast.     losartan 50 MG tablet  Commonly known as:  COZAAR  Take 1 tablet (50 mg total) by mouth daily. Hold for 48 hours, resume on 05/19/2013.     metoprolol 200 MG 24 hr tablet  Commonly known as:  TOPROL-XL  Take 1 tablet (200 mg total) by mouth daily.     multivitamin tablet  Take 1 tablet by mouth daily.     rosuvastatin 40 MG tablet  Commonly known as:  CRESTOR  Take 1 tablet (40 mg total) by mouth daily.     warfarin 5 MG tablet  Commonly known as:  COUMADIN  Take 5 mg by mouth daily.        Discharge Orders   Future Appointments Provider Department Dept Phone   06/18/2013 9:00 AM [facility] [phone]   07/02/2013 2:45 PM Kathleene Hazel, MD [facility] [place] Office [phone]   08/27/2013 2:00 PM Suanne Marker, MD [facility] [phone]   09/09/2013 8:30 AM Corwin Levins, MD [facility]  [place] [phone]   Future Orders Complete By Expires   Diet - low sodium heart healthy  As directed    Increase activity slowly  As directed      Follow-up Information   Follow up with [facility] On 06/18/2013. (at 9:00 am)       Follow up with Verne Carrow, MD On 07/02/2013. (at 2:45 pm)    Specialty:  Cardiology   Contact information:   [address] [place] [address] [phone]       BRING ALL MEDICATIONS WITH YOU TO FOLLOW UP APPOINTMENTS  Time spent with patient to include physician time: 36 min Signed: Theodore Demark, PA-C 05/16/2013, 12:28 PM Co-Sign MD

## 2013-05-16 NOTE — Progress Notes (Signed)
ANTICOAGULATION CONSULT NOTE - Follow Up Consult  Pharmacy Consult for coumadin Indication: valve replacement   Allergies  Allergen Reactions  . Celecoxib Itching    Patient Measurements: Height: 5\' 8"  (172.7 cm) Weight: 205 lb 9.6 oz (93.26 kg) IBW/kg (Calculated) : 68.4 Heparin Dosing Weight:   Vital Signs: Temp: 99.1 F (37.3 C) (10/17 0954) Temp src: Oral (10/17 0954) BP: 118/71 mmHg (10/17 0954) Pulse Rate: 57 (10/17 0954)  Labs:  Recent Labs  05/15/13 1355 05/16/13 0647  HGB 14.6  --   HCT 41.1  --   PLT 153  --   LABPROT 26.5* 26.8*  INR 2.54* 2.58*  CREATININE 1.54* 1.35    Estimated Creatinine Clearance: 66.1 ml/min (by C-G formula based on Cr of 1.35).   Medications:  Scheduled:  . atorvastatin  80 mg Oral q1800  . fenofibrate  160 mg Oral Daily  . ferrous sulfate  325 mg Oral Q breakfast  . metoprolol  200 mg Oral Daily  . multivitamin with minerals  1 tablet Oral Daily  . warfarin  5 mg Oral ONCE-1800  . Warfarin - Pharmacist Dosing Inpatient   Does not apply q1800   Infusions:  . sodium chloride 75 mL/hr at 05/16/13 9604    Assessment: 57 yo male with hx of valve replacement is currently on therapeutic coumadin.  INR today is 2.58.  MD also put in an order to start heparin if INR <2. Goal of Therapy:  INR 2-3 Monitor platelets by anticoagulation protocol: Yes   Plan:  1) Coumadin 5mg  po x1   Austin [name], Tsz-Yin 05/16/2013,9:56 AM

## 2013-05-16 NOTE — Progress Notes (Signed)
Patient refused morning medications.  Patient only to stay to get CT scan and will be discharged shortly after.  Patient verbalized that he will take his home medications at home so he will not be charged.

## 2013-05-16 NOTE — Progress Notes (Signed)
Discharged home.  Patient educated on discharge instructions, follow up appointments, and medication list.  Patient verbalized understanding.  AVF signed.  IV discontinued.  Cardiac monitor removed, CMT notified.  Patient belongings gathered.  Patient escorted out via wheelchair with volunteer services.

## 2013-05-16 NOTE — Discharge Summary (Signed)
See full note this am. cdm 

## 2013-06-09 ENCOUNTER — Other Ambulatory Visit: Payer: Self-pay | Admitting: Internal Medicine

## 2013-06-18 ENCOUNTER — Ambulatory Visit (INDEPENDENT_AMBULATORY_CARE_PROVIDER_SITE_OTHER): Payer: BC Managed Care – PPO | Admitting: General Practice

## 2013-06-18 DIAGNOSIS — Z952 Presence of prosthetic heart valve: Secondary | ICD-10-CM

## 2013-06-18 DIAGNOSIS — Z954 Presence of other heart-valve replacement: Secondary | ICD-10-CM

## 2013-06-18 DIAGNOSIS — I359 Nonrheumatic aortic valve disorder, unspecified: Secondary | ICD-10-CM

## 2013-06-18 LAB — POCT INR: INR: 2.7

## 2013-06-18 NOTE — Progress Notes (Signed)
Pre-visit discussion using our clinic review tool. No additional management support is needed unless otherwise documented below in the visit note.  

## 2013-07-02 ENCOUNTER — Encounter: Payer: Self-pay | Admitting: Cardiovascular Disease

## 2013-07-02 ENCOUNTER — Ambulatory Visit (INDEPENDENT_AMBULATORY_CARE_PROVIDER_SITE_OTHER): Payer: BC Managed Care – PPO | Admitting: Cardiovascular Disease

## 2013-07-02 ENCOUNTER — Ambulatory Visit: Payer: BC Managed Care – PPO | Admitting: Internal Medicine

## 2013-07-02 VITALS — BP 125/78 | HR 60 | Ht 68.0 in | Wt 202.4 lb

## 2013-07-02 DIAGNOSIS — Z954 Presence of other heart-valve replacement: Secondary | ICD-10-CM

## 2013-07-02 DIAGNOSIS — I7101 Dissection of thoracic aorta: Secondary | ICD-10-CM

## 2013-07-02 DIAGNOSIS — Z952 Presence of prosthetic heart valve: Secondary | ICD-10-CM

## 2013-07-02 NOTE — Progress Notes (Addendum)
History of Present Illness: [name] with history of HTN, hyperlipidemia and ascending aortic aneursym dissection with repair at [facility] in 2003 with St. Jude aortic valve prosthesis on chronic coumadin therapy here today for cardiac follow up. His surgery was at [facility] in 2003. He has had a residual thoracic and abdominal aortic dissection since then. Imaging showed large dissection extending from ascending aorta down into the abdominal aorta and iliacs. The false lumen in the descending thoracic aortic aneurysm is larger than the true lumen. There is some involvement of the arch vessels and renal arteries. He was also found to have a right lung nodule and lesions in the liver and kidneys that were hard to characterize. Echo March 2012 with normal LV function, normal functioning AVR. The descending thoracic aorta had enlarged in size from 5.2 x 5.3 cm to 5.5 x 5.5 cm. Plans were in place for repeat CTA in March 2013 but he was called and did not call back to schedule. He is on chronic coumadin for anticoagulation after mechanical AVR. 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 Repeat CTA chest and abdomen on 05/16/13 with stable thoracic and abdominal aortic aneurysm and dissection.   He feels great. No chest pain, SOB, palpitations, near syncope, syncope. No leg pains. Tolerating all meds. He has plans for cochlear implant with recent hearing loss. This is going to be done at [facility] in the ENT division, Dr. Lenoria Farrier.   Primary Care Physician: Dr. Oliver Barre  Last Lipid Profile:Lipid Panel     Component Value Date/Time   CHOL 208* 08/30/2012 0802   TRIG 197.0* 08/30/2012 0802   HDL 40.00 08/30/2012 0802   CHOLHDL 5 08/30/2012 0802   VLDL 39.4 08/30/2012 0802     Past Medical History  Diagnosis Date  . Coronary artery disease   . Hypertension   . Hyperlipidemia   . Obesity   . Internal hemorrhoid   . Pericardial effusion   . Thyroid  disease   . Back pain   . Anemia     iron def  . Thoracic aortic aneurysm     a. s/p dissection and repair @ [facility] 2003 with SJM AVR;  CTA 09/2010 desc thor Ao 5.2 x 5.3-5.5 x 5.5 cm.  . Renal insufficiency 05/16/2011  . Aortic valve prosthesis present     a. SJM - [facility] 2003, chronic coumadin.    Past Surgical History  Procedure Laterality Date  . Impingment      right shoulder  . Abdominal aortic aneurysm repair    . Aortic valve replacement    . Knee arthroscopy      left  . Wisdom tooth extraction      Current Outpatient Prescriptions  Medication Sig Dispense Refill  . CRESTOR 40 MG tablet TAKE 1 TABLET DAILY  90 tablet  2  . fenofibrate 160 MG tablet Take 160 mg by mouth daily.      . ferrous sulfate 325 (65 FE) MG tablet Take 325 mg by mouth daily with breakfast.        . losartan (COZAAR) 50 MG tablet Take 1 tablet (50 mg total) by mouth daily. Hold for 48 hours, resume on 05/19/2013.      . metoprolol (TOPROL-XL) 200 MG 24 hr tablet Take 1 tablet (200 mg total) by mouth daily.  30 tablet  10  . Multiple Vitamin (MULTIVITAMIN) tablet Take 1 tablet by mouth daily.        Marland Kitchen  warfarin (COUMADIN) 5 MG tablet Take 5 mg by mouth daily.       No current facility-administered medications for this visit.    Allergies  Allergen Reactions  . Celecoxib Itching    History   Social History  . Marital Status: Single    Spouse Name: N/A    Number of Children: 0  . Years of Education: MDIV   Occupational History  . [employer]  .     Social History Main Topics  . Smoking status: Former Smoker -- 1.00 packs/day for 15 years    Types: Cigarettes    Quit date: 10/10/2001  . Smokeless tobacco: Never Used  . Alcohol Use: Yes     Comment: one drink per day  . Drug Use: No  . Sexual Activity: Not Currently   Other Topics Concern  . Not on file   Social History Narrative   Pt lives at home alone.   Caffeine Use: Less than 1 cup daily.    Family History    Problem Relation Age of Onset  . Liver cancer Father   . Hypertension      Review of Systems:  As stated in the HPI and otherwise negative.   BP 125/78  Pulse 60  Ht 5\' 8"  (1.727 m)  Wt 202 lb 6.4 oz (91.808 kg)  BMI 30.78 kg/m2  Physical Examination: General: Well developed, well nourished, NAD HEENT: OP clear, mucus membranes moist SKIN: warm, dry. No rashes. Neuro: No focal deficits Musculoskeletal: Muscle strength 5/5 all ext Psychiatric: Mood and affect normal Neck: No JVD, loud sounds bilateral carotids, no thyromegaly, no lymphadenopathy. Lungs:Clear bilaterally, no wheezes, rhonci, crackles Cardiovascular: Regular rate and rhythm. Valve click with systolic murmur. No gallops or rubs. Abdomen:Soft. Bowel sounds present. Non-tender.  Extremities: No lower extremity edema. Pulses are 2 + in the bilateral DP/PT.  Echo 10/28/12: Left ventricle: The cavity size was normal. Wall thickness was increased in a pattern of mild LVH. Systolic function was normal. The estimated ejection fraction was in the range of 55% to 60%. Doppler parameters are consistent with high ventricular filling pressure. - Aortic valve: A St. Jude Medical mechanical prosthesis was present. Trivial regurgitation. - Right atrium: The atrium was mildly dilated.  CTA chest 05/16/13: 1. Continued slight interval increase in the diameter of the proximal descending thoracic aortic aneurysm, currently measuring 57 mm, previously, 55 mm. 2. Stable sequela of Type A thoracic aortic dissection with extension of the dissection into the left subclavian, left vertebral artery and (at least) the left axillary arteries. The dissection is again noted to extend through the aberrant right subclavian artery to (at least) the level of the right axillary artery. The dissection does not appear to result in a hemodynamically significant narrowing of any of the involved great vessels. 3. Grossly stable sequela of open  surgical repair of the aortic valve and ascending thoracic aorta. The dissection is again noted to extend caudally to involve the ascending thoracic aortic repair and abut the origin of the right coronary artery. 4. Unchanged approximately 5 mm noncalcified subpleural nodule within the right lower lobe, stable since the 2011 examination and loss of benign etiology. 5. Unchanged extension of the thoracic aortic aneurysm throughout the entirety of the abdominal aorta. The abdominal aorta is unchanged in size. 6. 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 7. 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 No  additional evidence of end organ ischemia. 8. Unchanged approximately 1.1 cm hyperattenuating left-sided renal lesion, stable since the 2011 examination and thus favored to represent a hyperdense renal cyst.  Assessment and Plan:   1. THORACIC AORTIC ANEURYSM: He is known to have type A aortic dissection with extension from the thoracic aorta through the iliacs involving the right renal artery. This has been stable. Last CTA was October 2014.    2. S/P aortic valve replacement: On chronic coumadin therapy. Stable. He will continue antibiotic prophylaxis before dental visits. He has plans for upcoming cochlear implant. Will need bridging with Lovenox while holding coumadin for procedure. I will fax this note to Dr. Marrianne Mood, ENT at [facility]. His coumadin is followed in the [place] office. Will have coumadin clinic arrange Lovenox bridge. Pt can proceed with his planned surgery without further cardiac workup.

## 2013-07-02 NOTE — Patient Instructions (Signed)
Your physician wants you to follow-up in:  12 months.  You will receive a reminder letter in the mail two months in advance. If you don't receive a letter, please call our office to schedule the follow-up appointment.   

## 2013-07-03 ENCOUNTER — Ambulatory Visit (INDEPENDENT_AMBULATORY_CARE_PROVIDER_SITE_OTHER): Payer: BC Managed Care – PPO | Admitting: Internal Medicine

## 2013-07-03 ENCOUNTER — Encounter: Payer: Self-pay | Admitting: Internal Medicine

## 2013-07-03 VITALS — BP 112/72 | HR 61 | Temp 97.7°F | Ht 68.0 in | Wt 202.5 lb

## 2013-07-03 DIAGNOSIS — Z7901 Long term (current) use of anticoagulants: Secondary | ICD-10-CM

## 2013-07-03 DIAGNOSIS — I10 Essential (primary) hypertension: Secondary | ICD-10-CM

## 2013-07-03 DIAGNOSIS — Z23 Encounter for immunization: Secondary | ICD-10-CM

## 2013-07-03 DIAGNOSIS — Z01818 Encounter for other preprocedural examination: Secondary | ICD-10-CM

## 2013-07-03 NOTE — Progress Notes (Signed)
Subjective:    Patient ID: Austin [name], male    DOB: [birth year], 57 y.o.   MRN: [id number]  HPI Due for left cochlear implant soon per ENT at [facility];  Needs lovenox rx, saw card yesterday with clearance, also needs pneumovax and prevnar.  Pt denies chest pain, increased sob or doe, wheezing, orthopnea, PND, increased LE swelling, palpitations, dizziness or syncope. Pt denies new neurological symptoms such as new headache, or facial or extremity weakness or numbness   Pt denies polydipsia, polyuria, No other acute complaints Past Medical History  Diagnosis Date  . Coronary artery disease   . Hypertension   . Hyperlipidemia   . Obesity   . Internal hemorrhoid   . Pericardial effusion   . Thyroid disease   . Back pain   . Anemia     iron def  . Thoracic aortic aneurysm     a. s/p dissection and repair @ [facility] 2003 with SJM AVR;  CTA 09/2010 desc thor Ao 5.2 x 5.3-5.5 x 5.5 cm.  . Renal insufficiency 05/16/2011  . Aortic valve prosthesis present     a. SJM - [facility] 2003, chronic coumadin.   Past Surgical History  Procedure Laterality Date  . Impingment      right shoulder  . Abdominal aortic aneurysm repair    . Aortic valve replacement    . Knee arthroscopy      left  . Wisdom tooth extraction      reports that he quit smoking about 11 years ago. His smoking use included Cigarettes. He has a 15 pack-year smoking history. He has never used smokeless tobacco. He reports that he drinks alcohol. He reports that he does not use illicit drugs. family history includes Hypertension in an other family member; Liver cancer in his father. Allergies  Allergen Reactions  . Celecoxib Itching   Current Outpatient Prescriptions on File Prior to Visit  Medication Sig Dispense Refill  . CRESTOR 40 MG tablet TAKE 1 TABLET DAILY  90 tablet  2  . fenofibrate 160 MG tablet Take 160 mg by mouth daily.      . ferrous sulfate 325 (65 FE) MG tablet Take 325 mg by mouth daily with breakfast.         . losartan (COZAAR) 50 MG tablet Take 1 tablet (50 mg total) by mouth daily. Hold for 48 hours, resume on 05/19/2013.      . metoprolol (TOPROL-XL) 200 MG 24 hr tablet Take 1 tablet (200 mg total) by mouth daily.  30 tablet  10  . Multiple Vitamin (MULTIVITAMIN) tablet Take 1 tablet by mouth daily.        Marland Kitchen warfarin (COUMADIN) 5 MG tablet Take 5 mg by mouth daily.       No current facility-administered medications on file prior to visit.   Review of Systems  Constitutional: Negative for unexpected weight change, or unusual diaphoresis  HENT: Negative for tinnitus.   Eyes: Negative for photophobia and visual disturbance.  Respiratory: Negative for choking and stridor.   Gastrointestinal: Negative for vomiting and blood in stool.  Genitourinary: Negative for hematuria and decreased urine volume.  Musculoskeletal: Negative for acute joint swelling Skin: Negative for color change and wound.  Neurological: Negative for tremors and numbness other than noted  Psychiatric/Behavioral: Negative for decreased concentration or  hyperactivity.       Objective:   Physical Exam BP 112/72  Pulse 61  Temp(Src) 97.7 F (36.5 C) (Oral)  Ht 5\' 8"  (  1.727 m)  Wt 202 lb 8 oz (91.853 kg)  BMI 30.80 kg/m2  SpO2 98% VS noted,  Constitutional: Pt appears well-developed and well-nourished.  HENT: Head: NCAT.  Right Ear: External ear normal.  Left Ear: External ear normal.  Eyes: Conjunctivae and EOM are normal. Pupils are equal, round, and reactive to light.  Neck: Normal range of motion. Neck supple.  Cardiovascular: Normal rate and regular rhythm.   Pulmonary/Chest: Effort normal and breath sounds normal.  Abd:  Soft, NT, non-distended, + BS Neurological: Pt is alert. Not confused  Skin: Skin is warm. No erythema.  Psychiatric: Pt behavior is normal. Thought content normal.         Assessment & Plan:

## 2013-07-03 NOTE — Patient Instructions (Addendum)
You had the prevnar pneumonia shot today Please return in 2 wks for Nurse Visit for the pnumovax pneumonia shot Please continue all other medications as before, and refills have been done if requested. Please have the pharmacy call with any other refills you may need. You are cleared for Surgury from a medicine standpoint.  You should hear from Bailey Mech about the [employer] and how to do this  Please keep your appointments with your specialists as you have planned

## 2013-07-03 NOTE — Assessment & Plan Note (Signed)
stable overall by history and exam, recent data reviewed with pt, and pt to continue medical treatment as before,  to f/u any worsening symptoms or concerns BP Readings from Last 3 Encounters:  07/03/13 112/72  07/02/13 125/78  05/16/13 118/71

## 2013-07-03 NOTE — Assessment & Plan Note (Signed)
Will arrange for coumadin clinic to address the bridging lovenox

## 2013-07-03 NOTE — Addendum Note (Signed)
Addended by: Scharlene Gloss B on: 07/03/2013 11:20 AM   Modules accepted: Orders

## 2013-07-03 NOTE — Progress Notes (Signed)
Pre-visit discussion using our clinic review tool. No additional management support is needed unless otherwise documented below in the visit note.  

## 2013-07-07 ENCOUNTER — Telehealth: Payer: Self-pay | Admitting: General Practice

## 2013-07-07 ENCOUNTER — Other Ambulatory Visit: Payer: Self-pay | Admitting: General Practice

## 2013-07-07 MED ORDER — ENOXAPARIN SODIUM 100 MG/ML ~~LOC~~ SOLN: 1.0000 mg/kg | Freq: Two times a day (BID) | SUBCUTANEOUS | Status: DC

## 2013-07-07 NOTE — Telephone Encounter (Signed)
Instructions for coumadin and Lovenox pre and post procedure. 12/25 - last dose of coumadin 12/26 - Nothing 12/27 - Lovenox AM and PM 12/28 - Lovenox AM and PM 12/29 - Lovenox AM only 12/30 - Procedure (Nothing) 12/31 - Lovenox AM and PM and 7.5 mg coumadin 1/1 - Lovenox AM and PM and 7.5 mg coumadin 1/2 - Lovenox AM and PM and 5 mg coumadin 1/3 - Lovenox AM and PM and 5 mg coumadin 1/4 - Lovenox AM and PM and 5 mg coumadin 1/5 - Lovenox AM and PM and 5 mg coumadin 1/6 - Check INR  Patient verbalized understanding.

## 2013-07-09 ENCOUNTER — Telehealth: Payer: Self-pay | Admitting: Cardiovascular Disease

## 2013-07-09 NOTE — Telephone Encounter (Signed)
Clearance and Lovenox schedule faxed to number below

## 2013-07-09 NOTE — Telephone Encounter (Signed)
New message    Pls fax clearance for surgery and copy of schedule for lovenox---fax to [phone] attn kathy at [facility]. Pt saw Dr Clifton James last week

## 2013-07-14 NOTE — Telephone Encounter (Signed)
New problem   Need permission for pt to go off coumadin and cardiac clearance for Cochlear Implant sx they have a new fax [phone]. Please fax today so pt can have sx. Stated it was probably fax to wrong fax that was given. Need it ASAP

## 2013-07-14 NOTE — Telephone Encounter (Signed)
Will have medical records fax today. (Last office note from visit with Dr. Clifton James and telephone note dated 12/8 with instructions regarding coumadin and lovenox)

## 2013-07-15 ENCOUNTER — Telehealth: Payer: Self-pay | Admitting: Cardiovascular Disease

## 2013-07-15 NOTE — Telephone Encounter (Signed)
New problem   Austin [name] need to speak to a nurse concerning surgical clearance that [facility] fax over yesterday. They need it to be changed a little. Please call and ask for Austin [name] to be overhead paged.

## 2013-07-15 NOTE — Telephone Encounter (Signed)
Spoke with Olegario Messier at [facility] who states Dr. Lenoria Farrier, ENT would like to make some changes to patient's Coumadin/Lovenox schedule.  I advised Olegario Messier that the patient's coumadin is managed at [facility] and I gave her the phone number to contact Bailey Mech, RN the nurse that sent the Coumadin/Lovenox schedule.  Olegario Messier states they also need Dr. Clifton James to type a note that states specifically that patient has cardiac clearance for surgery.  Olegario Messier asked that note be faxed ATTN: Olegario Messier to fax #[phone]. I advised Olegario Messier that Dr. Clifton James is not in the office today and that we will send message as soon as he responds.  The surgery is 12/30.  Olegario Messier verbalized understanding.

## 2013-07-16 NOTE — Telephone Encounter (Signed)
Note printed with addendum for surgical clearance. Fax to [facility]. CDM

## 2013-07-16 NOTE — Telephone Encounter (Signed)
Updated office note faxed to [facility]

## 2013-07-17 ENCOUNTER — Encounter: Payer: BC Managed Care – PPO | Admitting: *Deleted

## 2013-07-17 MED ORDER — PNEUMOCOCCAL VAC POLYVALENT 25 MCG/0.5ML IJ INJ: 0.5000 mL | INJECTION | Freq: Once | INTRAMUSCULAR | Status: DC

## 2013-07-17 NOTE — Progress Notes (Signed)
This encounter was created in error - please disregard.

## 2013-07-22 ENCOUNTER — Telehealth: Payer: Self-pay | Admitting: Cardiovascular Disease

## 2013-07-22 NOTE — Telephone Encounter (Signed)
Call back number listed above is incorrect. Previous phone note indicates number for Olegario Messier at [facility] is [phone]. I called this number and left message on Kathy's identified voicemail to call back

## 2013-07-22 NOTE — Telephone Encounter (Signed)
Spoke with Austin [name] and asked her to contact coumadin clinic at [facility] office for questions regarding bridging instructions.  Will also forward this note to Lodema Pilot, RN

## 2013-07-22 NOTE — Telephone Encounter (Signed)
New problem    Discuss coming off coumadin bridges to Lovenox. Prior to surgery 12/30.

## 2013-08-01 ENCOUNTER — Ambulatory Visit (INDEPENDENT_AMBULATORY_CARE_PROVIDER_SITE_OTHER): Payer: BC Managed Care – PPO | Admitting: General Practice

## 2013-08-01 DIAGNOSIS — Z954 Presence of other heart-valve replacement: Secondary | ICD-10-CM

## 2013-08-01 DIAGNOSIS — Z952 Presence of prosthetic heart valve: Secondary | ICD-10-CM

## 2013-08-01 DIAGNOSIS — I359 Nonrheumatic aortic valve disorder, unspecified: Secondary | ICD-10-CM

## 2013-08-01 LAB — POCT INR: INR: 1.1

## 2013-08-01 NOTE — Progress Notes (Signed)
Pre-visit discussion using our clinic review tool. No additional management support is needed unless otherwise documented below in the visit note.  

## 2013-08-04 ENCOUNTER — Ambulatory Visit: Payer: BC Managed Care – PPO | Admitting: Cardiovascular Disease

## 2013-08-04 ENCOUNTER — Telehealth: Payer: Self-pay

## 2013-08-04 ENCOUNTER — Emergency Department (HOSPITAL_COMMUNITY)
Admission: EM | Admit: 2013-08-04 | Discharge: 2013-08-04 | Disposition: A | Discharge status: Home / Self Care | Payer: BC Managed Care – PPO | Attending: Emergency Medicine | Admitting: Emergency Medicine

## 2013-08-04 ENCOUNTER — Encounter: Payer: Self-pay | Admitting: Cardiovascular Disease

## 2013-08-04 ENCOUNTER — Ambulatory Visit: Payer: BC Managed Care – PPO

## 2013-08-04 ENCOUNTER — Telehealth: Payer: Self-pay | Admitting: General Practice

## 2013-08-04 ENCOUNTER — Encounter (HOSPITAL_COMMUNITY): Payer: Self-pay | Admitting: Emergency Medicine

## 2013-08-04 VITALS — BP 132/90 | HR 61 | Ht 68.0 in | Wt 197.0 lb

## 2013-08-04 DIAGNOSIS — I359 Nonrheumatic aortic valve disorder, unspecified: Secondary | ICD-10-CM

## 2013-08-04 DIAGNOSIS — D649 Anemia, unspecified: Secondary | ICD-10-CM | POA: Insufficient documentation

## 2013-08-04 DIAGNOSIS — I2510 Atherosclerotic heart disease of native coronary artery without angina pectoris: Secondary | ICD-10-CM | POA: Insufficient documentation

## 2013-08-04 DIAGNOSIS — Z954 Presence of other heart-valve replacement: Secondary | ICD-10-CM | POA: Insufficient documentation

## 2013-08-04 DIAGNOSIS — T888XXA Other specified complications of surgical and medical care, not elsewhere classified, initial encounter: Secondary | ICD-10-CM

## 2013-08-04 DIAGNOSIS — E785 Hyperlipidemia, unspecified: Secondary | ICD-10-CM | POA: Insufficient documentation

## 2013-08-04 DIAGNOSIS — Z79899 Other long term (current) drug therapy: Secondary | ICD-10-CM | POA: Insufficient documentation

## 2013-08-04 DIAGNOSIS — I10 Essential (primary) hypertension: Secondary | ICD-10-CM | POA: Insufficient documentation

## 2013-08-04 DIAGNOSIS — IMO0002 Reserved for concepts with insufficient information to code with codable children: Secondary | ICD-10-CM | POA: Insufficient documentation

## 2013-08-04 DIAGNOSIS — Y838 Other surgical procedures as the cause of abnormal reaction of the patient, or of later complication, without mention of misadventure at the time of the procedure: Secondary | ICD-10-CM | POA: Insufficient documentation

## 2013-08-04 DIAGNOSIS — Z87891 Personal history of nicotine dependence: Secondary | ICD-10-CM | POA: Insufficient documentation

## 2013-08-04 DIAGNOSIS — Z7901 Long term (current) use of anticoagulants: Secondary | ICD-10-CM

## 2013-08-04 DIAGNOSIS — Z952 Presence of prosthetic heart valve: Secondary | ICD-10-CM

## 2013-08-04 DIAGNOSIS — Z791 Long term (current) use of non-steroidal anti-inflammatories (NSAID): Secondary | ICD-10-CM | POA: Insufficient documentation

## 2013-08-04 DIAGNOSIS — H9209 Otalgia, unspecified ear: Secondary | ICD-10-CM | POA: Insufficient documentation

## 2013-08-04 DIAGNOSIS — E669 Obesity, unspecified: Secondary | ICD-10-CM | POA: Insufficient documentation

## 2013-08-04 DIAGNOSIS — Z87448 Personal history of other diseases of urinary system: Secondary | ICD-10-CM | POA: Insufficient documentation

## 2013-08-04 LAB — CBC WITH DIFFERENTIAL/PLATELET
BASOS PCT: 0 % (ref 0–1)
Basophils Absolute: 0 10*3/uL (ref 0.0–0.1)
EOS ABS: 0.2 10*3/uL (ref 0.0–0.7)
Eosinophils Relative: 3 % (ref 0–5)
HCT: 43.8 % (ref 39.0–52.0)
HEMOGLOBIN: 15.3 g/dL (ref 13.0–17.0)
Lymphocytes Relative: 38 % (ref 12–46)
Lymphs Abs: 1.9 10*3/uL (ref 0.7–4.0)
MCH: 29.1 pg (ref 26.0–34.0)
MCHC: 34.9 g/dL (ref 30.0–36.0)
MCV: 83.3 fL (ref 78.0–100.0)
Monocytes Absolute: 0.5 10*3/uL (ref 0.1–1.0)
Monocytes Relative: 9 % (ref 3–12)
NEUTROS PCT: 49 % (ref 43–77)
Neutro Abs: 2.4 10*3/uL (ref 1.7–7.7)
Platelets: 166 10*3/uL (ref 150–400)
RBC: 5.26 MIL/uL (ref 4.22–5.81)
RDW: 13 % (ref 11.5–15.5)
WBC: 4.9 10*3/uL (ref 4.0–10.5)

## 2013-08-04 LAB — PROTIME-INR
INR: 1.22 (ref 0.00–1.49)
PROTHROMBIN TIME: 15.1 s (ref 11.6–15.2)

## 2013-08-04 NOTE — Progress Notes (Signed)
History of Present Illness: 58 yo AAM with history of HTN, hyperlipidemia and ascending aortic aneursym dissection with repair at [facility] in 2003 with St. Jude aortic valve prosthesis on chronic coumadin therapy here today for cardiac follow up. His surgery was at [facility] in 2003. He has had a residual thoracic and abdominal aortic dissection since then. Imaging showed large dissection extending from ascending aorta down into the abdominal aorta and iliacs. The false lumen in the descending thoracic aortic aneurysm is larger than the true lumen. There is some involvement of the arch vessels and renal arteries. He was also found to have a right lung nodule and lesions in the liver and kidneys that were hard to characterize. Echo March 2012 with normal LV function, normal functioning AVR. The descending thoracic aorta had enlarged in size from 5.2 x 5.3 cm to 5.5 x 5.5 cm. Plans were in place for repeat CTA in March 2013 but he was called and did not call back to schedule. He is on chronic coumadin for anticoagulation after mechanical AVR. 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 Repeat CTA chest and abdomen on 05/16/13 with stable thoracic and abdominal aortic aneurysm and dissection. He has undergone cochlear implant at [facility] last week (07/29/13) and his coumadin was held with Lovenox bridge. He has been started back on coumadin. He is now having bleeding from his left ear. He was seen in the [place] ED this am and the dressing over his left ear was changed. His bleeding has now stopped. Noted to be coming from his ear canal. No bleeding from surgical incision on left cheek anterior to left ear. He did inject Lovenox this am. No other complaints.   Primary Care Physician: Dr. Cathlean Cower  Last Lipid Profile:Lipid Panel     Component Value Date/Time   CHOL 208* 08/30/2012 0802   TRIG 197.0* 08/30/2012 0802   HDL 40.00 08/30/2012 0802   CHOLHDL 5 08/30/2012 0802   VLDL 39.4 08/30/2012 0802     Past Medical History  Diagnosis Date  . Coronary artery disease   . Hypertension   . Hyperlipidemia   . Obesity   . Internal hemorrhoid   . Pericardial effusion   . Thyroid disease   . Back pain   . Anemia     iron def  . Thoracic aortic aneurysm     a. s/p dissection and repair @ [facility] 2003 with SJM AVR;  CTA 09/2010 desc thor Ao 5.2 x 5.3-5.5 x 5.5 cm.  . Renal insufficiency 05/16/2011  . Aortic valve prosthesis present     a. SJM - [facility] 2003, chronic coumadin.    Past Surgical History  Procedure Laterality Date  . Impingment      right shoulder  . Abdominal aortic aneurysm repair    . Aortic valve replacement    . Knee arthroscopy      left  . Wisdom tooth extraction      Current Outpatient Prescriptions  Medication Sig Dispense Refill  . cephALEXin (KEFLEX) 500 MG capsule Take 500 mg by mouth 3 (three) times daily. 7 day course      . CRESTOR 40 MG tablet TAKE 1 TABLET DAILY  90 tablet  2  . fenofibrate 160 MG tablet Take 160 mg by mouth daily.      . ferrous sulfate 325 (65 FE) MG tablet Take 325 mg by mouth daily with breakfast.        .  HYDROcodone-acetaminophen (NORCO/VICODIN) 5-325 MG per tablet Take 1 tablet by mouth every 6 (six) hours as needed for moderate pain (pain).      Marland Kitchen losartan (COZAAR) 50 MG tablet Take 1 tablet (50 mg total) by mouth daily. Hold for 48 hours, resume on 05/19/2013.      . metoprolol (TOPROL-XL) 200 MG 24 hr tablet Take 1 tablet (200 mg total) by mouth daily.  30 tablet  10  . Multiple Vitamin (MULTIVITAMIN) tablet Take 1 tablet by mouth daily.        Marland Kitchen warfarin (COUMADIN) 5 MG tablet Take 5 mg by mouth daily.       No current facility-administered medications for this visit.    Allergies  Allergen Reactions  . Celecoxib Itching    History   Social History  . Marital Status: Single    Spouse Name: N/A    Number of Children: 0  . Years of Education: MDIV    Occupational History  . [employer]  .     Social History Main Topics  . Smoking status: Former Smoker -- 1.00 packs/day for 15 years    Types: Cigarettes    Quit date: 10/10/2001  . Smokeless tobacco: Never Used  . Alcohol Use: Yes     Comment: one drink per day  . Drug Use: No  . Sexual Activity: Not Currently   Other Topics Concern  . Not on file   Social History Narrative   Pt lives at home alone.   Caffeine Use: Less than 1 cup daily.    Family History  Problem Relation Age of Onset  . Liver cancer Father   . Hypertension      Review of Systems:  As stated in the HPI and otherwise negative.   BP 132/90  Pulse 61  Ht 5\' 8"  (1.727 m)  Wt 197 lb (89.359 kg)  BMI 29.96 kg/m2  Physical Examination: General: Well developed, well nourished, NAD HEENT: Large blood clot removed from left ear.  SKIN: warm, dry. No rashes. Neuro: No focal deficits Musculoskeletal: Muscle strength 5/5 all ext Psychiatric: Mood and affect normal Neck: No JVD, loud sounds bilateral carotids, no thyromegaly, no lymphadenopathy. Lungs:Clear bilaterally, no wheezes, rhonci, crackles Cardiovascular: Regular rate and rhythm. Valve click with systolic murmur. No gallops or rubs. Abdomen:Soft. Bowel sounds present. Non-tender.  Extremities: No lower extremity edema. Pulses are 2 + in the bilateral DP/PT.  Echo 10/28/12: Left ventricle: The cavity size was normal. Wall thickness was increased in a pattern of mild LVH. Systolic function was normal. The estimated ejection fraction was in the range of 55% to 60%. Doppler parameters are consistent with high ventricular filling pressure. - Aortic valve: A St. Jude Medical mechanical prosthesis was present. Trivial regurgitation. - Right atrium: The atrium was mildly dilated.  CTA chest 05/16/13: 1. Continued slight interval increase in the diameter of the proximal descending thoracic aortic aneurysm, currently measuring 57 mm,  previously, 55 mm. 2. Stable sequela of Type A thoracic aortic dissection with extension of the dissection into the left subclavian, left vertebral artery and (at least) the left axillary arteries. The dissection is again noted to extend through the aberrant right subclavian artery to (at least) the level of the right axillary artery. The dissection does not appear to result in a hemodynamically significant narrowing of any of the involved great vessels. 3. Grossly stable sequela of open surgical repair of the aortic valve and ascending thoracic aorta. The dissection is again noted to  extend caudally to involve the ascending thoracic aortic repair and abut the origin of the right coronary artery. 4. Unchanged approximately 5 mm noncalcified subpleural nodule within the right lower lobe, stable since the 2011 examination and loss of benign etiology. 5. Unchanged extension of the thoracic aortic aneurysm throughout the entirety of the abdominal aorta. The abdominal aorta is unchanged in size. 6. 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 7. 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 No additional evidence of end organ ischemia. 8. Unchanged approximately 1.1 cm hyperattenuating left-sided renal lesion, stable since the 2011 examination and thus favored to represent a hyperdense renal cyst.  Assessment and Plan:   1. THORACIC AORTIC ANEURYSM: He is known to have type A aortic dissection with extension from the thoracic aorta through the iliacs involving the right renal artery. This has been stable. Last CTA was October 2014.    2. S/P aortic valve replacement: On chronic coumadin therapy with current bridging with Lovenox following his recent ear surgery. He is having bleeding from the ear. Will have him stop Lovenox and will hold Coumadin for now. He has f/u with  [facility] on Wednesday to evaluate. From my perspective, his risk of mechanical valve thrombosis is low off of coumadin since he is young and his mechanical valve is in the aortic position. If he has no further bleeding, will resume coumadin next Monday and then have f/u in coumadin clinic toward the end of the week. I have removed the dressing from his left ear and there is no active bleeding but a large clot is removed from the left ear cavity.

## 2013-08-04 NOTE — Telephone Encounter (Signed)
[name] is s/p AoVR, has a dissecting anuerysm. He recently came off coumadin and was put on lovenox for ENT surgery - Cochlear implant.   Received Call from Dr. Idelle Crouch @ [facility]: [name] has bleeding (oozing) from the operative site. The recommendation is to hold the lovenox until the bleeding stops. Dr. Idelle Crouch has advised against going to [facility] at this time.    Attempted to call [name] - only number is his mobile; no answer at the workplace.  Will attempt to call again later if he doesn't call back.

## 2013-08-04 NOTE — Discharge Instructions (Signed)
You were seen today for bleeding from her left ear following cochlear implant surgery. Lab work is reassuring. Your INR was 1.22.  Continue your Coumadin Lovenox. You should followup with your surgeon on Wednesday as planned. If you have any further bleeding or concerns, he should contact his office sooner or return to the ER.

## 2013-08-04 NOTE — ED Provider Notes (Signed)
CSN: [id number]     Arrival date & time 08/04/13  0216 History   First MD Initiated Contact with Patient 08/04/13 0244     Chief Complaint  Patient presents with  . Ear Drainage    bleeding   (Consider location/radiation/quality/duration/timing/severity/associated sxs/prior Treatment) HPI  This is a 58 year old male with history of coronary artery disease, hypertension, hyperlipidemia, thoracic aortic aneurysm status post repair and valve placement on Coumadin who presents with postop bleeding. Patient states that he received a cochlear implant at [facility] on Tuesday of this past week. He states that he has been doing well. He restarted his Coumadin and Lovenox following surgery. Patient has noted a bleeding from his left ear over the last 24 hours. He is not change the dressing and is unsure where he is bleeding from. He denies any injury to the ear. Currently patient is also complaining of postop pain that is 5/10.  Past Medical History  Diagnosis Date  . Coronary artery disease   . Hypertension   . Hyperlipidemia   . Obesity   . Internal hemorrhoid   . Pericardial effusion   . Thyroid disease   . Back pain   . Anemia     iron def  . Thoracic aortic aneurysm     a. s/p dissection and repair @ [facility] 2003 with SJM AVR;  CTA 09/2010 desc thor Ao 5.2 x 5.3-5.5 x 5.5 cm.  . Renal insufficiency 05/16/2011  . Aortic valve prosthesis present     a. SJM - [facility] 2003, chronic coumadin.   Past Surgical History  Procedure Laterality Date  . Impingment      right shoulder  . Abdominal aortic aneurysm repair    . Aortic valve replacement    . Knee arthroscopy      left  . Wisdom tooth extraction     Family History  Problem Relation Age of Onset  . Liver cancer Father   . Hypertension     History  Substance Use Topics  . Smoking status: Former Smoker -- 1.00 packs/day for 15 years    Types: Cigarettes    Quit date: 10/10/2001  . Smokeless tobacco: Never Used  . Alcohol Use:  Yes     Comment: one drink per day    Review of Systems  Constitutional: Negative.  Negative for fever.  HENT: Positive for ear pain.        Bleeding from left  Respiratory: Negative.  Negative for chest tightness and shortness of breath.   Cardiovascular: Negative.  Negative for chest pain.  Gastrointestinal: Negative.  Negative for abdominal pain.  Genitourinary: Negative.  Negative for dysuria.  Neurological: Negative for headaches.  All other systems reviewed and are negative.    Allergies  Celecoxib  Home Medications   Current Outpatient Rx  Name  Route  Sig  Dispense  Refill  . cephALEXin (KEFLEX) 500 MG capsule   Oral   Take 500 mg by mouth 3 (three) times daily. 7 day course         . CRESTOR 40 MG tablet      TAKE 1 TABLET DAILY   90 tablet   2   . enoxaparin (LOVENOX) 100 MG/ML injection   Subcutaneous   Inject 0.9 mLs (90 mg total) into the skin every 12 (twelve) hours.   20 Syringe   0   . fenofibrate 160 MG tablet   Oral   Take 160 mg by mouth daily.         Marland Kitchen  ferrous sulfate 325 (65 FE) MG tablet   Oral   Take 325 mg by mouth daily with breakfast.           . HYDROcodone-acetaminophen (NORCO/VICODIN) 5-325 MG per tablet   Oral   Take 1 tablet by mouth every 6 (six) hours as needed for moderate pain (pain).         Marland Kitchen losartan (COZAAR) 50 MG tablet   Oral   Take 1 tablet (50 mg total) by mouth daily. Hold for 48 hours, resume on 05/19/2013.         . metoprolol (TOPROL-XL) 200 MG 24 hr tablet   Oral   Take 1 tablet (200 mg total) by mouth daily.   30 tablet   10   . Multiple Vitamin (MULTIVITAMIN) tablet   Oral   Take 1 tablet by mouth daily.           Marland Kitchen warfarin (COUMADIN) 5 MG tablet   Oral   Take 5 mg by mouth daily.          BP 140/77  Pulse 62  Temp(Src) 98.2 F (36.8 C) (Oral)  Resp 18  Ht 5\' 8"  (1.727 m)  Wt 196 lb (88.905 kg)  BMI 29.81 kg/m2  SpO2 100% Physical Exam  Nursing note and vitals  reviewed. Constitutional: He is oriented to person, place, and time. He appears well-developed and well-nourished. No distress.  HENT:  Head: Normocephalic.  Right Ear: External ear normal.  Incision posterior to the left ear clean dry and intact, sutures in place, clot noted coming from the external auditory canal without active bleeding, a blood clot removal, TM visualized with clots noted around the TM, no obvious TM injury  Eyes: Pupils are equal, round, and reactive to light.  Neck: Neck supple.  Cardiovascular: Normal rate, regular rhythm and normal heart sounds.   No murmur heard. Pulmonary/Chest: Effort normal and breath sounds normal. No respiratory distress. He has no wheezes.  Abdominal: Soft.  Musculoskeletal: He exhibits no edema.  Lymphadenopathy:    He has no cervical adenopathy.  Neurological: He is alert and oriented to person, place, and time.  Skin: Skin is warm and dry.  Psychiatric: He has a normal mood and affect.    ED Course  Procedures (including critical care time) Labs Review Labs Reviewed  PROTIME-INR  CBC WITH DIFFERENTIAL   Imaging Review No results found.  EKG Interpretation   None       MDM   1. Post-op bleeding, initial encounter    Patient presents with postop bleeding following cochlear implant placement on Tuesday. He has evidence of clot in the external auditory canal without active bleeding. Source of bleeding is unclear at this time. Otherwise patient's incision looks clean dry and intact and without evidence of infection or bleeding. The patient is a patient of Dr. Ann Held At [facility]. I discussed the patient with the on call ENT resident. I have redressed the wound. Patient can be discharged home with followup on Wednesday. INR continues to be subtherapeutic and he is to continue his Coumadin and Lovenox. Patient was given strict return precautions.  After history, exam, and medical workup I feel the patient has been appropriately  medically screened and is safe for discharge home. Pertinent diagnoses were discussed with the patient. Patient was given return precautions.     Merryl Hacker, MD 08/04/13 [id number]

## 2013-08-04 NOTE — Telephone Encounter (Signed)
Patient should wait for a return call. I got his original message and it was sent to Dr Linda Hedges. He is behind in his clinic this morning.

## 2013-08-04 NOTE — Telephone Encounter (Signed)
08/04/2013  Pt is in lobby.  States that he missed a call from Dr. Linda Hedges and wanted to speak him about ENT.  Please advise if pt should wait for phone call or should I schedule him for office visit.

## 2013-08-04 NOTE — ED Notes (Signed)
MD at bedside. 

## 2013-08-04 NOTE — Patient Instructions (Signed)
Your physician recommends that you schedule a follow-up appointment as scheduled with Dr. Angelena Form  Your physician has recommended you make the following change in your medication:  Stop Lovenox. Stop coumadin. Resume coumadin at normal dose on Monday if no further bleeding.  Call coumadin clinic to be seen next Friday--August 15, 2013

## 2013-08-04 NOTE — Telephone Encounter (Signed)
Austin [name], I think that it is reasonable to hold the Lovenox until the oozing stops. Gerald Stabs

## 2013-08-04 NOTE — Telephone Encounter (Signed)
Phone call from patient stating he is returning your call regarding ent consultation

## 2013-08-04 NOTE — Telephone Encounter (Signed)
Patient called and left message that he is having trouble with bleeding post cochlear implant.  Patient is now being bridged with Lovenox.  INR this AM in the ER was sub-therapeutic @ 1.1.  Patient calling Dr. Idelle Crouch, surgeon to put him in touch Dr. Angelena Form @ cardiology @ [phone].

## 2013-08-04 NOTE — ED Notes (Signed)
Patient is alert and oriented x3.  He has a cochlear implant placed on Tuesday at [facility]. He started bleeding from his left ear yesterday afternoon.  Currently he rates his 5 of 10 while on  Percocet.  Patient is currently on coumadin.

## 2013-08-06 ENCOUNTER — Other Ambulatory Visit: Payer: Self-pay | Admitting: Internal Medicine

## 2013-08-19 ENCOUNTER — Ambulatory Visit (INDEPENDENT_AMBULATORY_CARE_PROVIDER_SITE_OTHER): Payer: BC Managed Care – PPO | Admitting: General Practice

## 2013-08-19 DIAGNOSIS — Z7901 Long term (current) use of anticoagulants: Secondary | ICD-10-CM

## 2013-08-20 ENCOUNTER — Ambulatory Visit (INDEPENDENT_AMBULATORY_CARE_PROVIDER_SITE_OTHER): Payer: BC Managed Care – PPO | Admitting: General Practice

## 2013-08-20 DIAGNOSIS — Z952 Presence of prosthetic heart valve: Secondary | ICD-10-CM

## 2013-08-20 DIAGNOSIS — Z954 Presence of other heart-valve replacement: Secondary | ICD-10-CM

## 2013-08-20 DIAGNOSIS — Z7901 Long term (current) use of anticoagulants: Secondary | ICD-10-CM

## 2013-08-20 LAB — POCT INR: INR: 1.8

## 2013-08-20 NOTE — Progress Notes (Signed)
Pre-visit discussion using our clinic review tool. No additional management support is needed unless otherwise documented below in the visit note.  

## 2013-08-27 ENCOUNTER — Encounter: Payer: Self-pay | Admitting: Diagnostic Neuroimaging

## 2013-08-27 ENCOUNTER — Ambulatory Visit (INDEPENDENT_AMBULATORY_CARE_PROVIDER_SITE_OTHER): Payer: BC Managed Care – PPO | Admitting: Diagnostic Neuroimaging

## 2013-08-27 VITALS — BP 120/77 | HR 66 | Temp 97.8°F | Ht 68.0 in | Wt 203.0 lb

## 2013-08-27 DIAGNOSIS — H905 Unspecified sensorineural hearing loss: Secondary | ICD-10-CM

## 2013-08-27 NOTE — Progress Notes (Signed)
[facility]  PATIENT: Austin [name] DOB: 18-May-1956  REFERRING CLINICIAN: Simeon Craft HISTORY FROM: patient REASON FOR VISIT: follow up   HISTORICAL  CHIEF COMPLAINT:  Chief Complaint  Patient presents with  . Follow-up    HISTORY OF PRESENT ILLNESS:   UPDATE 08/27/13: Since last visit, saw ophthalmology, who found no evidence of microangiopathy in the retina to suggest Susac's disease. Patient recently had left cochlear implant (07/29/13) and is awaiting activation next week. No new neurologic events. Overall stable. Hearing loss is stable.  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  Around Christmas time, December 2013, patient woke up one morning with significant left-sided hearing loss. His hearing ability was replaced by the sound of "air-conditioner" running and whooshing sound. Patient went to his primary care doctor was treated with antibiotics for possible ear/sinus infection. Symptoms did not improve. He was referred to ENT who evaluated him, confirmed left-sided sensorineural hearing loss, and treated with course of steroids orally. No benefit. Patient was then treated with steroid injections and left ear without relief.   No vision changes, double vision, numbness, weakness, headache, confusion. Patient does report one week history of aphasia, postoperatively from aortic valve replacement in 2003.  REVIEW OF SYSTEMS: Full 14 system review of systems performed and notable only for hearing loss ringing in ears snoring allergies.  ALLERGIES: Allergies  Allergen Reactions  . Celecoxib Itching and Other (See Comments)    Joint pain    HOME MEDICATIONS: Outpatient Prescriptions Prior to Visit  Medication Sig Dispense Refill  . CRESTOR 40 MG tablet  TAKE 1 TABLET DAILY  90 tablet  2  . fenofibrate 160 MG tablet Take 160 mg by mouth daily.      . fenofibrate 160 MG tablet TAKE 1 TABLET DAILY (NO FURTHER REFILLS WITHOUT APPOINTMENT. LAST OFFICE VISIT 05/2011 MUST MAKE APPOINTMENT)  90 tablet  1  . ferrous sulfate 325 (65 FE) MG tablet Take 325 mg by mouth daily with breakfast.        . HYDROcodone-acetaminophen (NORCO/VICODIN) 5-325 MG per tablet Take 1 tablet by mouth every 6 (six) hours as needed for moderate pain (pain).      Marland Kitchen losartan (COZAAR) 50 MG tablet Take 1 tablet (50 mg total) by mouth daily. Hold for 48 hours, resume on 05/19/2013.      . metoprolol (TOPROL-XL) 200 MG 24 hr tablet Take 1 tablet (200 mg total) by mouth daily.  30 tablet  10  . Multiple Vitamin (MULTIVITAMIN) tablet Take 1 tablet by mouth daily.        Marland Kitchen warfarin (COUMADIN) 5 MG tablet Take 5 mg by mouth daily.      Marland Kitchen warfarin (COUMADIN) 5 MG tablet TAKE 1 TABLET AS DIRECTED  100 tablet  1   No facility-administered medications prior to visit.    PAST MEDICAL HISTORY: Past Medical History  Diagnosis Date  . Coronary artery disease   . Hypertension   . Hyperlipidemia   . Obesity   . Internal hemorrhoid   . Pericardial effusion   . Thyroid disease   . Back pain   . Anemia     iron def  . Thoracic aortic aneurysm     a. s/p dissection and repair @ [facility] 2003 with SJM AVR;  CTA 09/2010 desc thor Ao 5.2 x 5.3-5.5 x 5.5 cm.  Marland Kitchen  Renal insufficiency 05/16/2011  . Aortic valve prosthesis present     a. SJM - [facility] 2003, chronic coumadin.    PAST SURGICAL HISTORY: Past Surgical History  Procedure Laterality Date  . Impingment      right shoulder  . Abdominal aortic aneurysm repair    . Aortic valve replacement    . Knee arthroscopy      left  . Wisdom tooth extraction      FAMILY HISTORY: Family History  Problem Relation Age of Onset  . Liver cancer Father   . Hypertension      SOCIAL HISTORY:  History   Social History  . Marital Status:  Single    Spouse Name: N/A    Number of Children: 0  . Years of Education: MDIV   Occupational History  . [employer]  .     Social History Main Topics  . Smoking status: Former Smoker -- 1.00 packs/day for 15 years    Types: Cigarettes    Quit date: 10/10/2001  . Smokeless tobacco: Never Used  . Alcohol Use: Yes     Comment: one drink per day  . Drug Use: No  . Sexual Activity: Not Currently   Other Topics Concern  . Not on file   Social History Narrative   Pt lives at home alone.   Caffeine Use: Less than 1 cup daily.     PHYSICAL EXAM  Filed Vitals:   08/27/13 1352  BP: 120/77  Pulse: 66  Temp: 97.8 F (36.6 C)  TempSrc: Oral  Height: 5\' 8"  (1.727 m)  Weight: 203 lb (92.08 kg)    Not recorded    Body mass index is 30.87 kg/(m^2).  GENERAL EXAM: Patient is in no distress; LEFT MAXILLARY ECCHYMOSIS  CARDIOVASCULAR: Regular rate and rhythm, SYSTOLIC MURMUR; MECHANICAL CLICK  NEUROLOGIC: MENTAL STATUS: awake, alert, language fluent, comprehension intact, naming intact CRANIAL NERVE: no papilledema on fundoscopic exam, pupils equal and reactive to light, visual fields full to confrontation, extraocular muscles intact, no nystagmus, facial sensation and strength symmetric, uvula midline, shoulder shrug symmetric, tongue midline.  MOTOR: normal bulk and tone, full strength in the BUE, BLE SENSORY: normal and symmetric to light touch  COORDINATION: finger-nose-finger, fine finger movements normal REFLEXES: BUE 1, KNEES TRACE, ANKLES TRACE GAIT/STATION: narrow based gait; able to walk on toes, heels and tandem; romberg is negative   DIAGNOSTIC DATA (LABS, IMAGING, TESTING) - I reviewed patient records, labs, notes, testing and imaging myself where available.  Lab Results  Component Value Date   WBC 4.9 08/04/2013   HGB 15.3 08/04/2013   HCT 43.8 08/04/2013   MCV 83.3 08/04/2013   PLT 166 08/04/2013      Component Value Date/Time   NA 140  05/16/2013 0647   K 4.1 05/16/2013 0647   CL 108 05/16/2013 0647   CO2 22 05/16/2013 0647   GLUCOSE 77 05/16/2013 0647   BUN 17 05/16/2013 0647   CREATININE 1.35 05/16/2013 0647   CALCIUM 8.7 05/16/2013 0647   PROT 7.0 08/30/2012 0802   ALBUMIN 4.7 08/30/2012 0802   AST 34 08/30/2012 0802   ALT 32 08/30/2012 0802   ALKPHOS 23* 08/30/2012 0802   BILITOT 1.2 08/30/2012 0802   GFRNONAA 56* 05/16/2013 0647   GFRAA 65* 05/16/2013 0647   Lab Results  Component Value Date   CHOL 208* 08/30/2012   HDL 40.00 08/30/2012   LDLDIRECT 128.2 08/30/2012   TRIG 197.0* 08/30/2012   CHOLHDL 5 08/30/2012  No results found for this basename: HGBA1C   No results found for this basename: VITAMINB12   Lab Results  Component Value Date   TSH 2.89 08/30/2012    11/29/12 MRI brain and IAC (without) - no abnormalities of the left vestibulocochlear nerve, vestibular apparatus or brain stem. There were 2 chronic ischemic infarcts in the left frontal juxtacortical and right periventricular regions. Small left temporal cavernoma also identified. Mild chronic small vessel ischemic disease also noted. Mucosal thickening in the paranasal sinuses noted.  10/28/12 2D echocardiogram (TTE) -  - Left ventricle: The cavity size was normal. Wall thickness was increased in a pattern of mild LVH. Systolic function was normal. The estimated ejection fraction was in the range of 55% to 60%. Doppler parameters are consistent with high ventricular filling pressure. - Aortic valve: A St. Jude [employer] mechanical prosthesis was present. Trivial regurgitation. - Right atrium: The atrium was mildly dilated.  11/15/10 carotid u/s - normal   ASSESSMENT AND PLAN  58 y.o. year old male  has a past medical history of Coronary artery disease; Hypertension; Hyperlipidemia; Obesity; Internal hemorrhoid; Pericardial effusion; Thyroid disease; Back pain; Anemia; Thoracic aortic aneurysm; Renal insufficiency (05/16/2011); and Aortic valve  prosthesis present. here with new-onset left-sided sensorineural hearing loss in December 2013. Most likely this was related to viral or postviral phenomenon.   MRI brain findings most likely represent chronic ischemic infarcts. His history of one week aphasia in 2003 postoperatively, could be explained by his left frontal lesion. The right periventricular lesion is likely asymptomatic.  I referred patient to ophthalmology for detailed retinal exam to look for retinal abnormalities that are seen with Susac syndrome, and this was negative according to the patient.   PLAN: 1. Continue stroke risk factor management (coumadin, crestor, BP control)  Return if symptoms worsen or fail to improve, for return to PCP.    Penni Bombard, MD 6/73/4193, 7:90 PM Certified in Neurology, Neurophysiology and Neuroimaging  [facility] [address] [place], [place] [address] [phone]

## 2013-09-09 ENCOUNTER — Encounter: Payer: BC Managed Care – PPO | Admitting: Internal Medicine

## 2013-09-18 ENCOUNTER — Other Ambulatory Visit: Payer: Self-pay | Admitting: Internal Medicine

## 2013-09-19 ENCOUNTER — Other Ambulatory Visit (INDEPENDENT_AMBULATORY_CARE_PROVIDER_SITE_OTHER): Payer: BC Managed Care – PPO

## 2013-09-19 ENCOUNTER — Encounter: Payer: Self-pay | Admitting: Internal Medicine

## 2013-09-19 ENCOUNTER — Other Ambulatory Visit: Payer: Self-pay | Admitting: Internal Medicine

## 2013-09-19 ENCOUNTER — Ambulatory Visit (INDEPENDENT_AMBULATORY_CARE_PROVIDER_SITE_OTHER): Payer: BC Managed Care – PPO | Admitting: Internal Medicine

## 2013-09-19 VITALS — BP 130/78 | HR 61 | Temp 98.2°F | Ht 68.0 in | Wt 206.5 lb

## 2013-09-19 DIAGNOSIS — Z0000 Encounter for general adult medical examination without abnormal findings: Secondary | ICD-10-CM

## 2013-09-19 LAB — CBC WITH DIFFERENTIAL/PLATELET
Basophils Absolute: 0 10*3/uL (ref 0.0–0.1)
Basophils Relative: 0.4 % (ref 0.0–3.0)
EOS ABS: 0.2 10*3/uL (ref 0.0–0.7)
EOS PCT: 2.9 % (ref 0.0–5.0)
HEMATOCRIT: 46.5 % (ref 39.0–52.0)
Hemoglobin: 15.3 g/dL (ref 13.0–17.0)
LYMPHS ABS: 1.2 10*3/uL (ref 0.7–4.0)
Lymphocytes Relative: 23.8 % (ref 12.0–46.0)
MCHC: 33 g/dL (ref 30.0–36.0)
MCV: 87.3 fl (ref 78.0–100.0)
MONO ABS: 0.4 10*3/uL (ref 0.1–1.0)
Monocytes Relative: 7.6 % (ref 3.0–12.0)
NEUTROS PCT: 65.3 % (ref 43.0–77.0)
Neutro Abs: 3.4 10*3/uL (ref 1.4–7.7)
PLATELETS: 191 10*3/uL (ref 150.0–400.0)
RBC: 5.33 Mil/uL (ref 4.22–5.81)
RDW: 13.8 % (ref 11.5–14.6)
WBC: 5.2 10*3/uL (ref 4.5–10.5)

## 2013-09-19 LAB — LIPID PANEL
CHOLESTEROL: 181 mg/dL (ref 0–200)
HDL: 49.6 mg/dL (ref >39.00)
LDL CALC: 93 mg/dL (ref 0–99)
Total CHOL/HDL Ratio: 4
Triglycerides: 192 mg/dL — ABNORMAL HIGH (ref 0.0–149.0)
VLDL: 38.4 mg/dL (ref 0.0–40.0)

## 2013-09-19 LAB — BASIC METABOLIC PANEL
BUN: 20 mg/dL (ref 6–23)
CHLORIDE: 107 meq/L (ref 96–112)
CO2: 28 mEq/L (ref 19–32)
Calcium: 9.7 mg/dL (ref 8.4–10.5)
Creatinine, Ser: 1.5 mg/dL (ref 0.4–1.5)
GFR: 62.21 mL/min (ref >60.00)
Glucose, Bld: 109 mg/dL — ABNORMAL HIGH (ref 70–99)
Potassium: 4.8 mEq/L (ref 3.5–5.1)
Sodium: 141 mEq/L (ref 135–145)

## 2013-09-19 LAB — URINALYSIS, ROUTINE W REFLEX MICROSCOPIC
Bilirubin Urine: NEGATIVE
Hgb urine dipstick: NEGATIVE
KETONES UR: NEGATIVE
Leukocytes, UA: NEGATIVE
NITRITE: NEGATIVE
PH: 8 (ref 5.0–8.0)
Specific Gravity, Urine: 1.02 (ref 1.000–1.030)
TOTAL PROTEIN, URINE-UPE24: 30 — AB
Urine Glucose: NEGATIVE
Urobilinogen, UA: 0.2 (ref 0.0–1.0)

## 2013-09-19 LAB — HEPATIC FUNCTION PANEL
ALT: 35 U/L (ref 0–53)
AST: 31 U/L (ref 0–37)
Albumin: 4.5 g/dL (ref 3.5–5.2)
Alkaline Phosphatase: 27 U/L — ABNORMAL LOW (ref 39–117)
BILIRUBIN TOTAL: 0.9 mg/dL (ref 0.3–1.2)
Bilirubin, Direct: 0.2 mg/dL (ref 0.0–0.3)
Total Protein: 6.6 g/dL (ref 6.0–8.3)

## 2013-09-19 LAB — TSH: TSH: 1.81 u[IU]/mL (ref 0.35–5.50)

## 2013-09-19 LAB — PSA: PSA: 2.08 ng/mL (ref 0.10–4.00)

## 2013-09-19 MED ORDER — LOSARTAN POTASSIUM 50 MG PO TABS: 50.0000 mg | ORAL_TABLET | Freq: Every day | ORAL | Status: DC

## 2013-09-19 NOTE — Patient Instructions (Addendum)
Please continue all other medications as before, and refills have been done if requested - the losartan Please have the pharmacy call with any other refills you may need. Please continue your efforts at being more active, low cholesterol diet, and weight control. You are otherwise up to date with prevention measures today. Please keep your appointments with your specialists as you have planned - Dr Shirley Friar will be contacted regarding the referral for: Gastroenterology, to see about the colonoscopy  Please go to the LAB in the Basement (turn left off the elevator) for the tests to be done today You will be contacted by phone if any changes need to be made immediately.  Otherwise, you will receive a letter about your results with an explanation, but please check with MyChart first.  Please return in 1 year for your yearly visit, or sooner if needed, with Lab testing done 3-5 days before

## 2013-09-19 NOTE — Progress Notes (Signed)
Subjective:    Patient ID: Austin [name], male    DOB: [birth year], 58 y.o.   MRN: [id number]  HPI  Here for wellness and f/u;  Overall doing ok;  Pt denies CP, worsening SOB, DOE, wheezing, orthopnea, PND, worsening LE edema, palpitations, dizziness or syncope.  Pt denies neurological change such as new headache, facial or extremity weakness.  Pt denies polydipsia, polyuria, or low sugar symptoms. Pt states overall good compliance with treatment and medications, good tolerability, and has been trying to follow lower cholesterol diet.  Pt denies worsening depressive symptoms, suicidal ideation or panic. No fever, night sweats, wt loss, loss of appetite, or other constitutional symptoms.  Pt states good ability with ADL's, has low fall risk, home safety reviewed and adequate, no other significant changes in hearing or vision, and only occasionally active with exercise. Wt up 4 lbs in last 6 mo.  Has new left cochlear implant, hearing improved, and Can have MRI if needed. Past Medical History  Diagnosis Date  . Coronary artery disease   . Hypertension   . Hyperlipidemia   . Obesity   . Internal hemorrhoid   . Pericardial effusion   . Thyroid disease   . Back pain   . Anemia     iron def  . Thoracic aortic aneurysm     a. s/p dissection and repair @ [facility] 2003 with SJM AVR;  CTA 09/2010 desc thor Ao 5.2 x 5.3-5.5 x 5.5 cm.  . Renal insufficiency 05/16/2011  . Aortic valve prosthesis present     a. SJM - [facility] 2003, chronic coumadin.   Past Surgical History  Procedure Laterality Date  . Impingment      right shoulder  . Abdominal aortic aneurysm repair    . Aortic valve replacement    . Knee arthroscopy      left  . Wisdom tooth extraction      reports that he quit smoking about 11 years ago. His smoking use included Cigarettes. He has a 15 pack-year smoking history. He has never used smokeless tobacco. He reports that he drinks alcohol. He reports that he does not use illicit  drugs. family history includes Hypertension in an other family member; Liver cancer in his father. Allergies  Allergen Reactions  . Celecoxib Itching and Other (See Comments)    Joint pain   Current Outpatient Prescriptions on File Prior to Visit  Medication Sig Dispense Refill  . fenofibrate 160 MG tablet Take 160 mg by mouth.      . ferrous sulfate 325 (65 FE) MG tablet Take 325 mg by mouth.      . metoprolol (TOPROL XL) 200 MG 24 hr tablet Take 1 tablet by mouth daily.      . Multiple Vitamin (MULTIVITAMIN) tablet Take 1 tablet by mouth daily.      . rosuvastatin (CRESTOR) 40 MG tablet Take 40 mg by mouth daily.      Marland Kitchen warfarin (COUMADIN) 5 MG tablet Take 5 mg by mouth daily.       No current facility-administered medications on file prior to visit.   Review of Systems Constitutional: Negative for diaphoresis, activity change, appetite change or unexpected weight change.  HENT: Negative for hearing loss, ear pain, facial swelling, mouth sores and neck stiffness.   Eyes: Negative for pain, redness and visual disturbance.  Respiratory: Negative for shortness of breath and wheezing.   Cardiovascular: Negative for chest pain and palpitations.  Gastrointestinal: Negative for diarrhea, blood in stool,  abdominal distention or other pain Genitourinary: Negative for hematuria, flank pain or change in urine volume.  Musculoskeletal: Negative for myalgias and joint swelling.  Skin: Negative for color change and wound.  Neurological: Negative for syncope and numbness. other than noted Hematological: Negative for adenopathy.  Psychiatric/Behavioral: Negative for hallucinations, self-injury, decreased concentration and agitation.      Objective:   Physical Exam BP 130/78  Pulse 61  Temp(Src) 98.2 F (36.8 C) (Oral)  Ht 5\' 8"  (1.727 m)  Wt 206 lb 8 oz (93.668 kg)  BMI 31.41 kg/m2  SpO2 99% VS noted,  Constitutional: Pt is oriented to person, place, and time. Appears well-developed and  well-nourished. Annabell Sabal Head: Normocephalic and atraumatic.  Right Ear: External ear normal.  Left Ear: External ear normal.  Nose: Nose normal.  Mouth/Throat: Oropharynx is clear and moist.  Eyes: Conjunctivae and EOM are normal. Pupils are equal, round, and reactive to light.  Neck: Normal range of motion. Neck supple. No JVD present. No tracheal deviation present.  Cardiovascular: Normal rate, regular rhythm, normal heart sounds and intact distal pulses.   Pulmonary/Chest: Effort normal and breath sounds normal.  Abdominal: Soft. Bowel sounds are normal. There is no tenderness. No HSM  Musculoskeletal: Normal range of motion. Exhibits no edema.  Lymphadenopathy:  Has no cervical adenopathy.  Neurological: Pt is alert and oriented to person, place, and time. Pt has normal reflexes. No cranial nerve deficit.  Skin: Skin is warm and dry. No rash noted.  Psychiatric:  Has  normal mood and affect. Behavior is normal.     Assessment & Plan:

## 2013-09-19 NOTE — Progress Notes (Signed)
Pre-visit discussion using our clinic review tool. No additional management support is needed unless otherwise documented below in the visit note.  

## 2013-09-20 NOTE — Assessment & Plan Note (Signed)

## 2013-09-24 ENCOUNTER — Ambulatory Visit (INDEPENDENT_AMBULATORY_CARE_PROVIDER_SITE_OTHER): Payer: BC Managed Care – PPO | Admitting: General Practice

## 2013-09-24 DIAGNOSIS — Z5181 Encounter for therapeutic drug level monitoring: Secondary | ICD-10-CM

## 2013-09-24 DIAGNOSIS — Z7901 Long term (current) use of anticoagulants: Secondary | ICD-10-CM

## 2013-09-24 DIAGNOSIS — Z954 Presence of other heart-valve replacement: Secondary | ICD-10-CM

## 2013-09-24 DIAGNOSIS — Z7189 Other specified counseling: Secondary | ICD-10-CM | POA: Insufficient documentation

## 2013-09-24 DIAGNOSIS — Z952 Presence of prosthetic heart valve: Secondary | ICD-10-CM

## 2013-09-24 LAB — POCT INR: INR: 1.5

## 2013-09-24 NOTE — Progress Notes (Signed)
Pre visit review using our clinic review tool, if applicable. No additional management support is needed unless otherwise documented below in the visit note. 

## 2013-10-08 ENCOUNTER — Ambulatory Visit (INDEPENDENT_AMBULATORY_CARE_PROVIDER_SITE_OTHER): Payer: BC Managed Care – PPO | Admitting: General Practice

## 2013-10-08 DIAGNOSIS — Z954 Presence of other heart-valve replacement: Secondary | ICD-10-CM

## 2013-10-08 DIAGNOSIS — Z952 Presence of prosthetic heart valve: Secondary | ICD-10-CM

## 2013-10-08 DIAGNOSIS — Z7901 Long term (current) use of anticoagulants: Secondary | ICD-10-CM

## 2013-10-08 LAB — POCT INR: INR: 1.5

## 2013-10-08 NOTE — Progress Notes (Signed)
Pre visit review using our clinic review tool, if applicable. No additional management support is needed unless otherwise documented below in the visit note. 

## 2013-10-13 ENCOUNTER — Encounter: Payer: Self-pay | Admitting: Physician Assistant

## 2013-10-13 ENCOUNTER — Ambulatory Visit (INDEPENDENT_AMBULATORY_CARE_PROVIDER_SITE_OTHER): Payer: BC Managed Care – PPO | Admitting: Physician Assistant

## 2013-10-13 VITALS — BP 134/72 | HR 78 | Ht 68.0 in | Wt 212.0 lb

## 2013-10-13 DIAGNOSIS — Z7901 Long term (current) use of anticoagulants: Secondary | ICD-10-CM

## 2013-10-13 DIAGNOSIS — Z1211 Encounter for screening for malignant neoplasm of colon: Secondary | ICD-10-CM

## 2013-10-13 NOTE — Progress Notes (Signed)
Subjective:    Patient ID: Austin [name], male    DOB: 05-Oct-1955, 58 y.o.   MRN: [id number]  HPI  Austin [name] is a pleasant 58 year old African American male with history of hypertension, hyperlipidemia and a thoracic aortic aneurysm dissection which was repaired in 2003 at [facility]. He also had St. Jude aortic valve replacement and is maintained on Coumadin. Patient also has history of chronic renal insufficiency. He did undergo colonoscopy in 2003 at [facility] which was done for screening and was unremarkable.  He is referred back today by primary care for consideration of colonoscopy for screening. He is of average risk with no family history of colon cancer or polyps. He is currently asymptomatic with no complaints of abdominal discomfort change in bowel habits melena or hematochezia.  Patient does have a residual thoracic and abdominal aortic dissection. Imaging in 2012 showed the descending thoracic aorta to have enlarged to 5.5 x 5.5 cm and repeat CT of the chest and abdomen in October of 2014 showed stable thoracic and abdominal aortic aneurysm and dissection. He has dissection into the iliac and some involvement of the renal arteries.    Review of Systems  Constitutional: Negative.   HENT: Negative.   Eyes: Negative.   Respiratory: Negative.   Cardiovascular: Negative.   Gastrointestinal: Negative.   Endocrine: Negative.   Genitourinary: Negative.   Musculoskeletal: Negative.   Skin: Negative.   Allergic/Immunologic: Negative.   Neurological: Negative.   Hematological: Negative.   Psychiatric/Behavioral: Negative.    Outpatient Encounter Prescriptions as of 10/13/2013  Medication Sig  . fenofibrate 160 MG tablet Take 160 mg by mouth.  . ferrous sulfate 325 (65 FE) MG tablet Take 325 mg by mouth.  . losartan (COZAAR) 50 MG tablet Take 1 tablet (50 mg total) by mouth daily.  . metoprolol (TOPROL XL) 200 MG 24 hr tablet Take 1 tablet by mouth daily.  . metoprolol (TOPROL-XL)  200 MG 24 hr tablet TAKE 1 TABLET DAILY  . Multiple Vitamin (MULTIVITAMIN) tablet Take 1 tablet by mouth daily.  . rosuvastatin (CRESTOR) 40 MG tablet Take 40 mg by mouth daily.  Marland Kitchen warfarin (COUMADIN) 5 MG tablet Take 5 mg by mouth daily.   Allergies  Allergen Reactions  . Celecoxib Itching and Other (See Comments)    Joint pain   Patient Active Problem List   Diagnosis Date Noted  . Encounter for therapeutic drug monitoring 09/24/2013  . Aortic valve prosthesis present   . Thoracic aortic aneurysm   . Left-sided sensorineural hearing loss 12/27/2012  . Embolic stroke 01/21/7627  . Chronic anticoagulation 09/16/2012  . CKD (chronic kidney disease) 05/16/2011  . Preventative health care 05/14/2011  . CAROTID BRUIT 10/14/2010  . S/P aortic valve replacement 09/20/2010  . LUMBAR RADICULOPATHY, RIGHT 08/12/2009  . ILIAC ARTERY ANEURYSM 04/02/2009  . Unspecified Iron Deficiency Anemia 10/29/2007  . EROSIVE GASTRITIS 05/03/2007  . HYPOTHYROIDISM, SECONDARY 03/05/2007  . HYPERLIPIDEMIA 03/05/2007  . OBESITY 03/05/2007  . HYPERTENSION 03/05/2007  . CORONARY ARTERY DISEASE 03/05/2007  . PERICARDIAL EFFUSION 03/05/2007  . Internal hemorrhoids without mention of complication 31/51/7616  . LOW BACK PAIN 03/05/2007  . SHOULDER IMPINGEMENT SYNDROME, RIGHT 03/05/2007     History  Substance Use Topics  . Smoking status: Former Smoker -- 1.00 packs/day for 15 years    Types: Cigarettes    Quit date: 10/10/2001  . Smokeless tobacco: Never Used  . Alcohol Use: Yes     Comment: one drink per day   family  history includes Liver cancer in his father. There is no history of Colon cancer.     Objective:   Physical Exam  well-developed Afro-American male in no acute distress, pleasant blood pressure 134/72 pulse 78 height 5 foot 8 weight 212. HEENT; nontraumatic normocephalic EOMI PERRLA sclera anicteric, Supple; no JVD, Cardiovascular; regular rate and rhythm with S1-S2, mechanical valve  click, Pulmonary; clear bilaterally, Abdomen; soft nontender nondistended bowel sounds are active there is no palpable mass or hepatosplenomegaly, Rectal; not done, Extremities; no clubbing cyanosis or edema skin warm dry, Psych; mood and affect appropriate        Assessment & Plan:  #14  58 year old male status post aortic valve replacement 2003 St. Jude valve on chronic Coumadin #2 colon neoplasia surveillance, average risk, asymptomatic with negative colonoscopy 2003 #3 chronic thoracic and abdominal aortic dissection with prior  ascending aortic aneurysmal dissection repair 2003, last imaging showed the descending. Thoracic aorta enlarged to 5.5 x 5.5 cm #4 status post recent cochlear implant with bleeding complication on Lovenox #5 chronic kidney disease #6 history of of an embolic stroke  Plan; patient is not a good candidate for screening colonoscopy given need for chronic anticoagulation and Lovenox bridging for any procedures, history of valve replacement and prior embolic stroke. He also has a chronic thoracic and abdominal aortic dissection. Would not pursue colonoscopy at this time. We discussed options of virtual colonoscopy, barium enema and Cologuard stool DNA testing. Patient has history of chronic renal insufficiency and does not want to have IV dye so will pursue Colo guard stool DNA testing initially. If this is positive he may require colonoscopy with Lovenox. If negative should not need any further screening at this time

## 2013-10-13 NOTE — Patient Instructions (Signed)
You will receive a call from [facility] regarding your Cologuard Test. Once we receive the results we will call you.

## 2013-10-14 NOTE — Progress Notes (Signed)
Really not a good candidate for routine screening optical colonoscopy. Other options reviewed as outline. Amy please follow up on results

## 2013-10-22 ENCOUNTER — Ambulatory Visit (INDEPENDENT_AMBULATORY_CARE_PROVIDER_SITE_OTHER): Payer: BC Managed Care – PPO | Admitting: General Practice

## 2013-10-22 DIAGNOSIS — Z5181 Encounter for therapeutic drug level monitoring: Secondary | ICD-10-CM

## 2013-10-22 DIAGNOSIS — Z954 Presence of other heart-valve replacement: Secondary | ICD-10-CM

## 2013-10-22 DIAGNOSIS — Z7901 Long term (current) use of anticoagulants: Secondary | ICD-10-CM

## 2013-10-22 DIAGNOSIS — Z952 Presence of prosthetic heart valve: Secondary | ICD-10-CM

## 2013-10-22 LAB — POCT INR: INR: 1.5

## 2013-10-22 NOTE — Progress Notes (Signed)
Pre visit review using our clinic review tool, if applicable. No additional management support is needed unless otherwise documented below in the visit note. 

## 2013-11-03 ENCOUNTER — Encounter: Payer: Self-pay | Admitting: Internal Medicine

## 2013-11-05 ENCOUNTER — Ambulatory Visit (INDEPENDENT_AMBULATORY_CARE_PROVIDER_SITE_OTHER): Payer: BC Managed Care – PPO | Admitting: General Practice

## 2013-11-05 DIAGNOSIS — Z954 Presence of other heart-valve replacement: Secondary | ICD-10-CM

## 2013-11-05 DIAGNOSIS — Z952 Presence of prosthetic heart valve: Secondary | ICD-10-CM

## 2013-11-05 DIAGNOSIS — Z7901 Long term (current) use of anticoagulants: Secondary | ICD-10-CM

## 2013-11-05 DIAGNOSIS — Z5181 Encounter for therapeutic drug level monitoring: Secondary | ICD-10-CM

## 2013-11-05 LAB — POCT INR: INR: 2.6

## 2013-11-05 NOTE — Progress Notes (Signed)
Pre visit review using our clinic review tool, if applicable. No additional management support is needed unless otherwise documented below in the visit note. 

## 2013-12-03 ENCOUNTER — Ambulatory Visit (INDEPENDENT_AMBULATORY_CARE_PROVIDER_SITE_OTHER): Payer: BC Managed Care – PPO | Admitting: General Practice

## 2013-12-03 DIAGNOSIS — Z954 Presence of other heart-valve replacement: Secondary | ICD-10-CM

## 2013-12-03 DIAGNOSIS — Z952 Presence of prosthetic heart valve: Secondary | ICD-10-CM

## 2013-12-03 DIAGNOSIS — Z7901 Long term (current) use of anticoagulants: Secondary | ICD-10-CM

## 2013-12-03 DIAGNOSIS — Z5181 Encounter for therapeutic drug level monitoring: Secondary | ICD-10-CM

## 2013-12-03 LAB — POCT INR: INR: 2.8

## 2013-12-03 NOTE — Progress Notes (Signed)
Pre visit review using our clinic review tool, if applicable. No additional management support is needed unless otherwise documented below in the visit note. 

## 2013-12-31 ENCOUNTER — Ambulatory Visit (INDEPENDENT_AMBULATORY_CARE_PROVIDER_SITE_OTHER): Payer: BC Managed Care – PPO | Admitting: General Practice

## 2013-12-31 DIAGNOSIS — Z954 Presence of other heart-valve replacement: Secondary | ICD-10-CM

## 2013-12-31 DIAGNOSIS — Z5181 Encounter for therapeutic drug level monitoring: Secondary | ICD-10-CM

## 2013-12-31 DIAGNOSIS — Z952 Presence of prosthetic heart valve: Secondary | ICD-10-CM

## 2013-12-31 DIAGNOSIS — Z7901 Long term (current) use of anticoagulants: Secondary | ICD-10-CM

## 2013-12-31 LAB — POCT INR: INR: 3.2

## 2013-12-31 LAB — COLOGUARD

## 2013-12-31 NOTE — Progress Notes (Signed)
Pre visit review using our clinic review tool, if applicable. No additional management support is needed unless otherwise documented below in the visit note. 

## 2014-02-02 ENCOUNTER — Other Ambulatory Visit: Payer: Self-pay | Admitting: General Practice

## 2014-02-02 ENCOUNTER — Other Ambulatory Visit: Payer: Self-pay | Admitting: Internal Medicine

## 2014-02-02 MED ORDER — WARFARIN SODIUM 5 MG PO TABS: ORAL_TABLET | ORAL | Status: DC

## 2014-02-04 ENCOUNTER — Ambulatory Visit (INDEPENDENT_AMBULATORY_CARE_PROVIDER_SITE_OTHER): Payer: BC Managed Care – PPO | Admitting: General Practice

## 2014-02-04 DIAGNOSIS — Z5181 Encounter for therapeutic drug level monitoring: Secondary | ICD-10-CM

## 2014-02-04 DIAGNOSIS — Z952 Presence of prosthetic heart valve: Secondary | ICD-10-CM

## 2014-02-04 DIAGNOSIS — Z7901 Long term (current) use of anticoagulants: Secondary | ICD-10-CM

## 2014-02-04 DIAGNOSIS — Z954 Presence of other heart-valve replacement: Secondary | ICD-10-CM

## 2014-02-04 LAB — POCT INR: INR: 3.6

## 2014-02-04 NOTE — Progress Notes (Signed)
Pre visit review using our clinic review tool, if applicable. No additional management support is needed unless otherwise documented below in the visit note. 

## 2014-02-10 ENCOUNTER — Telehealth: Payer: Self-pay

## 2014-02-10 NOTE — Telephone Encounter (Signed)
Per Dr. Henrene Pastor pts Cologuard results were negative. Results sent to medical records to scan into epic. Called pt and left message for pt to call back.

## 2014-02-11 NOTE — Telephone Encounter (Signed)
Spoke with pt and he is aware. 

## 2014-02-17 ENCOUNTER — Encounter: Payer: Self-pay | Admitting: Internal Medicine

## 2014-02-19 ENCOUNTER — Other Ambulatory Visit: Payer: Self-pay | Admitting: Internal Medicine

## 2014-02-25 ENCOUNTER — Ambulatory Visit (INDEPENDENT_AMBULATORY_CARE_PROVIDER_SITE_OTHER): Payer: BC Managed Care – PPO | Admitting: General Practice

## 2014-02-25 DIAGNOSIS — Z952 Presence of prosthetic heart valve: Secondary | ICD-10-CM

## 2014-02-25 DIAGNOSIS — Z7901 Long term (current) use of anticoagulants: Secondary | ICD-10-CM

## 2014-02-25 DIAGNOSIS — Z5181 Encounter for therapeutic drug level monitoring: Secondary | ICD-10-CM

## 2014-02-25 DIAGNOSIS — Z954 Presence of other heart-valve replacement: Secondary | ICD-10-CM

## 2014-02-25 LAB — POCT INR: INR: 2.4

## 2014-02-25 NOTE — Progress Notes (Signed)
Pre visit review using our clinic review tool, if applicable. No additional management support is needed unless otherwise documented below in the visit note. 

## 2014-03-25 ENCOUNTER — Ambulatory Visit (INDEPENDENT_AMBULATORY_CARE_PROVIDER_SITE_OTHER): Payer: BC Managed Care – PPO | Admitting: *Deleted

## 2014-03-25 DIAGNOSIS — Z952 Presence of prosthetic heart valve: Secondary | ICD-10-CM

## 2014-03-25 DIAGNOSIS — Z954 Presence of other heart-valve replacement: Secondary | ICD-10-CM

## 2014-03-25 DIAGNOSIS — Z7901 Long term (current) use of anticoagulants: Secondary | ICD-10-CM

## 2014-03-25 DIAGNOSIS — Z5181 Encounter for therapeutic drug level monitoring: Secondary | ICD-10-CM

## 2014-03-25 LAB — POCT INR: INR: 4.5

## 2014-04-15 ENCOUNTER — Ambulatory Visit (INDEPENDENT_AMBULATORY_CARE_PROVIDER_SITE_OTHER): Payer: BC Managed Care – PPO | Admitting: *Deleted

## 2014-04-15 DIAGNOSIS — Z952 Presence of prosthetic heart valve: Secondary | ICD-10-CM

## 2014-04-15 DIAGNOSIS — Z5181 Encounter for therapeutic drug level monitoring: Secondary | ICD-10-CM

## 2014-04-15 DIAGNOSIS — Z7901 Long term (current) use of anticoagulants: Secondary | ICD-10-CM

## 2014-04-15 DIAGNOSIS — Z954 Presence of other heart-valve replacement: Secondary | ICD-10-CM

## 2014-04-15 LAB — POCT INR: INR: 4.6

## 2014-05-06 ENCOUNTER — Ambulatory Visit (INDEPENDENT_AMBULATORY_CARE_PROVIDER_SITE_OTHER): Payer: BC Managed Care – PPO | Admitting: *Deleted

## 2014-05-06 DIAGNOSIS — Z952 Presence of prosthetic heart valve: Secondary | ICD-10-CM

## 2014-05-06 DIAGNOSIS — Z954 Presence of other heart-valve replacement: Secondary | ICD-10-CM

## 2014-05-06 DIAGNOSIS — Z7901 Long term (current) use of anticoagulants: Secondary | ICD-10-CM

## 2014-05-06 DIAGNOSIS — Z5181 Encounter for therapeutic drug level monitoring: Secondary | ICD-10-CM

## 2014-05-06 LAB — POCT INR: INR: 3.4

## 2014-05-15 ENCOUNTER — Other Ambulatory Visit: Payer: Self-pay

## 2014-05-27 ENCOUNTER — Ambulatory Visit (INDEPENDENT_AMBULATORY_CARE_PROVIDER_SITE_OTHER): Payer: BC Managed Care – PPO | Admitting: *Deleted

## 2014-05-27 DIAGNOSIS — Z7901 Long term (current) use of anticoagulants: Secondary | ICD-10-CM

## 2014-05-27 DIAGNOSIS — Z5181 Encounter for therapeutic drug level monitoring: Secondary | ICD-10-CM

## 2014-05-27 DIAGNOSIS — Z954 Presence of other heart-valve replacement: Secondary | ICD-10-CM

## 2014-05-27 DIAGNOSIS — Z952 Presence of prosthetic heart valve: Secondary | ICD-10-CM

## 2014-05-27 LAB — POCT INR: INR: 2.9

## 2014-07-01 ENCOUNTER — Ambulatory Visit (INDEPENDENT_AMBULATORY_CARE_PROVIDER_SITE_OTHER): Payer: BC Managed Care – PPO

## 2014-07-01 ENCOUNTER — Telehealth: Payer: Self-pay | Admitting: Family

## 2014-07-01 DIAGNOSIS — Z954 Presence of other heart-valve replacement: Secondary | ICD-10-CM

## 2014-07-01 DIAGNOSIS — Z23 Encounter for immunization: Secondary | ICD-10-CM

## 2014-07-01 DIAGNOSIS — Z5181 Encounter for therapeutic drug level monitoring: Secondary | ICD-10-CM

## 2014-07-01 DIAGNOSIS — Z7901 Long term (current) use of anticoagulants: Secondary | ICD-10-CM

## 2014-07-01 DIAGNOSIS — Z952 Presence of prosthetic heart valve: Secondary | ICD-10-CM

## 2014-07-01 LAB — POCT INR: INR: 4.1

## 2014-07-01 NOTE — Telephone Encounter (Signed)
Agree with plan 

## 2014-07-09 DIAGNOSIS — Z4889 Encounter for other specified surgical aftercare: Secondary | ICD-10-CM | POA: Insufficient documentation

## 2014-07-13 ENCOUNTER — Other Ambulatory Visit: Payer: Self-pay | Admitting: Internal Medicine

## 2014-07-15 ENCOUNTER — Ambulatory Visit (INDEPENDENT_AMBULATORY_CARE_PROVIDER_SITE_OTHER): Payer: BC Managed Care – PPO

## 2014-07-15 ENCOUNTER — Telehealth: Payer: Self-pay | Admitting: Family

## 2014-07-15 DIAGNOSIS — Z7901 Long term (current) use of anticoagulants: Secondary | ICD-10-CM

## 2014-07-15 DIAGNOSIS — Z954 Presence of other heart-valve replacement: Secondary | ICD-10-CM

## 2014-07-15 DIAGNOSIS — Z952 Presence of prosthetic heart valve: Secondary | ICD-10-CM

## 2014-07-15 DIAGNOSIS — Z5181 Encounter for therapeutic drug level monitoring: Secondary | ICD-10-CM

## 2014-07-15 LAB — POCT INR: INR: 3.1

## 2014-07-15 NOTE — Telephone Encounter (Signed)
Agree with plan 

## 2014-08-05 ENCOUNTER — Ambulatory Visit (INDEPENDENT_AMBULATORY_CARE_PROVIDER_SITE_OTHER): Payer: BLUE CROSS/BLUE SHIELD | Admitting: Family Medicine

## 2014-08-05 DIAGNOSIS — Z952 Presence of prosthetic heart valve: Secondary | ICD-10-CM

## 2014-08-05 DIAGNOSIS — Z954 Presence of other heart-valve replacement: Secondary | ICD-10-CM

## 2014-08-05 DIAGNOSIS — Z5181 Encounter for therapeutic drug level monitoring: Secondary | ICD-10-CM

## 2014-08-05 DIAGNOSIS — Z7901 Long term (current) use of anticoagulants: Secondary | ICD-10-CM

## 2014-08-05 LAB — POCT INR: INR: 3

## 2014-08-10 ENCOUNTER — Other Ambulatory Visit: Payer: Self-pay | Admitting: Internal Medicine

## 2014-08-21 ENCOUNTER — Other Ambulatory Visit: Payer: Self-pay | Admitting: Internal Medicine

## 2014-09-02 ENCOUNTER — Telehealth: Payer: Self-pay | Admitting: Family

## 2014-09-02 ENCOUNTER — Ambulatory Visit (INDEPENDENT_AMBULATORY_CARE_PROVIDER_SITE_OTHER): Payer: BLUE CROSS/BLUE SHIELD | Admitting: General Practice

## 2014-09-02 DIAGNOSIS — Z954 Presence of other heart-valve replacement: Secondary | ICD-10-CM

## 2014-09-02 DIAGNOSIS — Z952 Presence of prosthetic heart valve: Secondary | ICD-10-CM

## 2014-09-02 DIAGNOSIS — Z7901 Long term (current) use of anticoagulants: Secondary | ICD-10-CM

## 2014-09-02 DIAGNOSIS — Z5181 Encounter for therapeutic drug level monitoring: Secondary | ICD-10-CM

## 2014-09-02 LAB — POCT INR: INR: 4

## 2014-09-02 NOTE — Telephone Encounter (Signed)
Agree with plan 

## 2014-09-02 NOTE — Progress Notes (Signed)
Pre visit review using our clinic review tool, if applicable. No additional management support is needed unless otherwise documented below in the visit note. 

## 2014-09-23 ENCOUNTER — Other Ambulatory Visit (INDEPENDENT_AMBULATORY_CARE_PROVIDER_SITE_OTHER): Payer: BLUE CROSS/BLUE SHIELD

## 2014-09-23 ENCOUNTER — Ambulatory Visit (INDEPENDENT_AMBULATORY_CARE_PROVIDER_SITE_OTHER): Payer: BLUE CROSS/BLUE SHIELD | Admitting: General Practice

## 2014-09-23 ENCOUNTER — Ambulatory Visit (INDEPENDENT_AMBULATORY_CARE_PROVIDER_SITE_OTHER): Payer: BLUE CROSS/BLUE SHIELD | Admitting: Internal Medicine

## 2014-09-23 ENCOUNTER — Encounter: Payer: Self-pay | Admitting: Internal Medicine

## 2014-09-23 VITALS — BP 124/80 | HR 66 | Temp 97.8°F | Resp 18 | Ht 68.0 in | Wt 218.1 lb

## 2014-09-23 DIAGNOSIS — M25562 Pain in left knee: Secondary | ICD-10-CM | POA: Insufficient documentation

## 2014-09-23 DIAGNOSIS — M25511 Pain in right shoulder: Secondary | ICD-10-CM

## 2014-09-23 DIAGNOSIS — Z5181 Encounter for therapeutic drug level monitoring: Secondary | ICD-10-CM

## 2014-09-23 DIAGNOSIS — Z0000 Encounter for general adult medical examination without abnormal findings: Secondary | ICD-10-CM

## 2014-09-23 DIAGNOSIS — Z952 Presence of prosthetic heart valve: Secondary | ICD-10-CM

## 2014-09-23 DIAGNOSIS — R21 Rash and other nonspecific skin eruption: Secondary | ICD-10-CM

## 2014-09-23 LAB — BASIC METABOLIC PANEL
BUN: 32 mg/dL — ABNORMAL HIGH (ref 6–23)
CO2: 30 meq/L (ref 19–32)
Calcium: 9.5 mg/dL (ref 8.4–10.5)
Chloride: 106 mEq/L (ref 96–112)
Creatinine, Ser: 1.52 mg/dL — ABNORMAL HIGH (ref 0.40–1.50)
GFR: 60.58 mL/min (ref >60.00)
Glucose, Bld: 107 mg/dL — ABNORMAL HIGH (ref 70–99)
Potassium: 4.5 mEq/L (ref 3.5–5.1)
SODIUM: 140 meq/L (ref 135–145)

## 2014-09-23 LAB — URINALYSIS, ROUTINE W REFLEX MICROSCOPIC
Bilirubin Urine: NEGATIVE
Hgb urine dipstick: NEGATIVE
Ketones, ur: NEGATIVE
Leukocytes, UA: NEGATIVE
Nitrite: NEGATIVE
PH: 7.5 (ref 5.0–8.0)
RBC / HPF: NONE SEEN (ref 0–?)
Specific Gravity, Urine: 1.015 (ref 1.000–1.030)
URINE GLUCOSE: NEGATIVE
Urobilinogen, UA: 0.2 (ref 0.0–1.0)
WBC, UA: NONE SEEN (ref 0–?)

## 2014-09-23 LAB — CBC WITH DIFFERENTIAL/PLATELET
BASOS ABS: 0 10*3/uL (ref 0.0–0.1)
Basophils Relative: 0.8 % (ref 0.0–3.0)
EOS PCT: 5 % (ref 0.0–5.0)
Eosinophils Absolute: 0.2 10*3/uL (ref 0.0–0.7)
HCT: 46.2 % (ref 39.0–52.0)
Hemoglobin: 16 g/dL (ref 13.0–17.0)
LYMPHS PCT: 31.3 % (ref 12.0–46.0)
Lymphs Abs: 1.3 10*3/uL (ref 0.7–4.0)
MCHC: 34.6 g/dL (ref 30.0–36.0)
MCV: 83.1 fl (ref 78.0–100.0)
Monocytes Absolute: 0.4 10*3/uL (ref 0.1–1.0)
Monocytes Relative: 9.8 % (ref 3.0–12.0)
Neutro Abs: 2.2 10*3/uL (ref 1.4–7.7)
Neutrophils Relative %: 53.1 % (ref 43.0–77.0)
Platelets: 165 10*3/uL (ref 150.0–400.0)
RBC: 5.55 Mil/uL (ref 4.22–5.81)
RDW: 13.6 % (ref 11.5–15.5)
WBC: 4.1 10*3/uL (ref 4.0–10.5)

## 2014-09-23 LAB — LIPID PANEL
Cholesterol: 189 mg/dL (ref 0–200)
HDL: 44.2 mg/dL (ref >39.00)
NONHDL: 144.8
Total CHOL/HDL Ratio: 4
Triglycerides: 237 mg/dL — ABNORMAL HIGH (ref 0.0–149.0)
VLDL: 47.4 mg/dL — AB (ref 0.0–40.0)

## 2014-09-23 LAB — PSA: PSA: 1.41 ng/mL (ref 0.10–4.00)

## 2014-09-23 LAB — HEPATIC FUNCTION PANEL
ALBUMIN: 4.7 g/dL (ref 3.5–5.2)
ALT: 25 U/L (ref 0–53)
AST: 30 U/L (ref 0–37)
Alkaline Phosphatase: 23 U/L — ABNORMAL LOW (ref 39–117)
Bilirubin, Direct: 0.1 mg/dL (ref 0.0–0.3)
Total Bilirubin: 0.7 mg/dL (ref 0.2–1.2)
Total Protein: 6.7 g/dL (ref 6.0–8.3)

## 2014-09-23 LAB — POCT INR: INR: 3

## 2014-09-23 LAB — LDL CHOLESTEROL, DIRECT: Direct LDL: 114 mg/dL

## 2014-09-23 LAB — TSH: TSH: 1.75 u[IU]/mL (ref 0.35–4.50)

## 2014-09-23 MED ORDER — METOPROLOL SUCCINATE ER 200 MG PO TB24: 200.0000 mg | ORAL_TABLET | Freq: Every day | ORAL | Status: DC

## 2014-09-23 NOTE — Progress Notes (Signed)
Pre visit review using our clinic review tool, if applicable. No additional management support is needed unless otherwise documented below in the visit note. 

## 2014-09-23 NOTE — Assessment & Plan Note (Signed)
For sport med referral 

## 2014-09-23 NOTE — Progress Notes (Signed)
Agree with plan 

## 2014-09-23 NOTE — Assessment & Plan Note (Signed)
For derm refrrral

## 2014-09-23 NOTE — Assessment & Plan Note (Signed)
For sport med  Referral

## 2014-09-23 NOTE — Progress Notes (Signed)
Subjective:    Patient ID: Austin [name], male    DOB: [birth year], 59 y.o.   MRN: [id number]  HPI  Here for wellness and f/u;  Overall doing ok;  Pt denies CP, worsening SOB, DOE, wheezing, orthopnea, PND, worsening LE edema, palpitations, dizziness or syncope.  Pt denies neurological change such as new headache, facial or extremity weakness.  Pt denies polydipsia, polyuria, or low sugar symptoms. Pt states overall good compliance with treatment and medications, good tolerability, and has been trying to follow lower cholesterol diet.  Pt denies worsening depressive symptoms, suicidal ideation or panic. No fever, night sweats, wt loss, loss of appetite, or other constitutional symptoms.  Pt states good ability with ADL's, has low fall risk, home safety reviewed and adequate, no other significant changes in hearing or vision, and only occasionally active with exercise.  Just a bit overdue for card f/u next wk.  No acute complaints. Does have gradaully worsening left knee and right shoulder pain and reduced ROM, favors the leg in the AM, asks to see Dr Smith/sport med.  Also with recurring rash to distal RLE without pain or drainage, just irritative but keeps recurring.  Nothing seems to make better or worse  Has appt at [facility] today for f/u INR. No overt bleeding Wt Readings from Last 3 Encounters:  09/23/14 218 lb 1.9 oz (98.939 kg)  10/13/13 212 lb (96.163 kg)  09/19/13 206 lb 8 oz (93.668 kg)   Past Medical History  Diagnosis Date  . Coronary artery disease   . Hypertension   . Hyperlipidemia   . Obesity   . Internal hemorrhoid   . Pericardial effusion   . Thyroid disease   . Back pain   . Anemia     iron def  . Thoracic aortic aneurysm     a. s/p dissection and repair @ [facility] 2003 with SJM AVR;  CTA 09/2010 desc thor Ao 5.2 x 5.3-5.5 x 5.5 cm.  . Renal insufficiency 05/16/2011  . Aortic valve prosthesis present     a. SJM - [facility] 2003, chronic coumadin.   Past Surgical History    Procedure Laterality Date  . Impingment      right shoulder  . Abdominal aortic aneurysm repair    . Aortic valve replacement    . Knee arthroscopy      left  . Wisdom tooth extraction      reports that he quit smoking about 12 years ago. His smoking use included Cigarettes. He has a 15 pack-year smoking history. He has never used smokeless tobacco. He reports that he drinks alcohol. He reports that he does not use illicit drugs. family history includes Liver cancer in his father. There is no history of Colon cancer. Allergies  Allergen Reactions  . Celecoxib Itching and Other (See Comments)    Joint pain   Current Outpatient Prescriptions on File Prior to Visit  Medication Sig Dispense Refill  . CRESTOR 40 MG tablet TAKE 1 TABLET DAILY 90 tablet 0  . fenofibrate 160 MG tablet Take 160 mg by mouth.    . fenofibrate 160 MG tablet Take 1 tablet (160 mg total) by mouth daily. 90 tablet 2  . ferrous sulfate 325 (65 FE) MG tablet Take 325 mg by mouth.    . losartan (COZAAR) 50 MG tablet Take 1 tablet (50 mg total) by mouth daily. 90 tablet 3  . losartan (COZAAR) 50 MG tablet TAKE 1 TABLET DAILY 90 tablet 0  .  metoprolol (TOPROL-XL) 200 MG 24 hr tablet TAKE 1 TABLET DAILY 30 tablet 11  . Multiple Vitamin (MULTIVITAMIN) tablet Take 1 tablet by mouth daily.    . rosuvastatin (CRESTOR) 40 MG tablet Take 40 mg by mouth daily.    Marland Kitchen warfarin (COUMADIN) 5 MG tablet TAKE AS DIRECTED BY ANTICOAGULATION CLINIC 135 tablet 0   No current facility-administered medications on file prior to visit.   Review of Systems Constitutional: Negative for increased diaphoresis, other activity, appetite or other siginficant weight change  HENT: Negative for worsening hearing loss, ear pain, facial swelling, mouth sores and neck stiffness.   Eyes: Negative for other worsening pain, redness or visual disturbance.  Respiratory: Negative for shortness of breath and wheezing.   Cardiovascular: Negative for chest  pain and palpitations.  Gastrointestinal: Negative for diarrhea, blood in stool, abdominal distention or other pain Genitourinary: Negative for hematuria, flank pain or change in urine volume.  Musculoskeletal: Negative for myalgias or other joint complaints.  Skin: Negative for color change and wound.  Neurological: Negative for syncope and numbness. other than noted Hematological: Negative for adenopathy. or other swelling Psychiatric/Behavioral: Negative for hallucinations, self-injury, decreased concentration or other worsening agitation.      Objective:   Physical Exam BP 124/80 mmHg  Pulse 66  Temp(Src) 97.8 F (36.6 C) (Oral)  Resp 18  Ht 5\' 8"  (1.727 m)  Wt 218 lb 1.9 oz (98.939 kg)  BMI 33.17 kg/m2  SpO2 95% VS noted,  Constitutional: Pt is oriented to person, place, and time. Appears well-developed and well-nourished.  Head: Normocephalic and atraumatic.  Right Ear: External ear normal.  Left Ear: External ear normal.  Nose: Nose normal.  Mouth/Throat: Oropharynx is clear and moist.  Eyes: Conjunctivae and EOM are normal. Pupils are equal, round, and reactive to light.  Neck: Normal range of motion. Neck supple. No JVD present. No tracheal deviation present.  Cardiovascular: Normal rate, regular rhythm, normal heart sounds and intact distal pulses.   Pulmonary/Chest: Effort normal and breath sounds without rales or wheezing  Abdominal: Soft. Bowel sounds are normal. NT. No HSM  Musculoskeletal: Normal range of motion. Exhibits no edema.  Lymphadenopathy:  Has no cervical adenopathy.  Neurological: Pt is alert and oriented to person, place, and time. Pt has normal reflexes. No cranial nerve deficit. Motor grossly intact Skin: Skin is warm and dry. + nontender scaly 3 cm area rash noted distal right leg approx 2 cm above ankle, no red/drainage.  Right shoudler with reduced ROM to forward elevation Left knee with crepitus, no effusion, NT Psychiatric:  Has normal mood  and affect. Behavior is normal.      Assessment & Plan:

## 2014-09-23 NOTE — Patient Instructions (Signed)
Please continue all other medications as before, and refills have been done if requested.  Please have the pharmacy call with any other refills you may need.  Please continue your efforts at being more active, low cholesterol diet, and weight control.  You are otherwise up to date with prevention measures today.  Please keep your appointments with your specialists as you may have planned  You will be contacted regarding the referral for: Dr Tamala Julian, for left knee and right shoulder, as well as Dermatology  Please go to the LAB in the Basement (turn left off the elevator) for the tests to be done today  You will be contacted by phone if any changes need to be made immediately.  Otherwise, you will receive a letter about your results with an explanation, but please check with MyChart first.  Please remember to sign up for MyChart if you have not done so, as this will be important to you in the future with finding out test results, communicating by private email, and scheduling acute appointments online when needed.  Please return in 1 year for your yearly visit, or sooner if needed, with Lab testing done 3-5 days before

## 2014-09-23 NOTE — Assessment & Plan Note (Signed)

## 2014-09-30 ENCOUNTER — Ambulatory Visit (INDEPENDENT_AMBULATORY_CARE_PROVIDER_SITE_OTHER): Payer: BLUE CROSS/BLUE SHIELD | Admitting: Cardiovascular Disease

## 2014-09-30 ENCOUNTER — Encounter: Payer: Self-pay | Admitting: Cardiovascular Disease

## 2014-09-30 VITALS — BP 98/60 | HR 62 | Ht 68.0 in | Wt 221.0 lb

## 2014-09-30 DIAGNOSIS — Z7901 Long term (current) use of anticoagulants: Secondary | ICD-10-CM

## 2014-09-30 DIAGNOSIS — I712 Thoracic aortic aneurysm, without rupture, unspecified: Secondary | ICD-10-CM

## 2014-09-30 DIAGNOSIS — I7101 Dissection of thoracic aorta: Secondary | ICD-10-CM

## 2014-09-30 DIAGNOSIS — Z952 Presence of prosthetic heart valve: Secondary | ICD-10-CM

## 2014-09-30 DIAGNOSIS — I71019 Dissection of thoracic aorta, unspecified: Secondary | ICD-10-CM

## 2014-09-30 DIAGNOSIS — Z954 Presence of other heart-valve replacement: Secondary | ICD-10-CM

## 2014-09-30 NOTE — Patient Instructions (Signed)
Your physician wants you to follow-up in: 12 months. You will receive a reminder letter in the mail two months in advance. If you don't receive a letter, please call our office to schedule the follow-up appointment.  Non-Cardiac CT scanning, (CAT scanning), is a noninvasive, special x-ray that produces cross-sectional images of the body using x-rays and a computer. CT scans help physicians diagnose and treat medical conditions. For some CT exams, a contrast material is used to enhance visibility in the area of the body being studied. CT scans provide greater clarity and reveal more details than regular x-ray exams. To be done in October   Your physician recommends that you return for lab work 4-5 days prior to CT (BMP)

## 2014-09-30 NOTE — Progress Notes (Signed)
History of Present Illness: 59 yo AAM with history of HTN, hyperlipidemia and ascending aortic aneursym dissection with repair at [facility] in 2003 with St. Jude aortic valve prosthesis on chronic coumadin therapy here today for cardiac follow up. His surgery was at [facility] in 2003. He has had a residual thoracic and abdominal aortic dissection since then. Imaging showed large dissection extending from ascending aorta down into the abdominal aorta and iliacs. The false lumen in the descending thoracic aortic aneurysm is larger than the true lumen. There is some involvement of the arch vessels and renal arteries. He was also found to have a right lung nodule and lesions in the liver and kidneys that were hard to characterize. Echo 10/28/12 with normal LV function, normal functioning AVR. He is on chronic coumadin for anticoagulation after mechanical AVR. 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 Repeat CTA chest and abdomen on 05/16/13 with stable thoracic and abdominal aortic aneurysm and dissection.   He is here today for follow up. No chest pain or SOB. No other complaints. No lower extremity edema.   Primary Care Physician: Dr. Cathlean Cower  Last Lipid Profile:Lipid Panel     Component Value Date/Time   CHOL 189 09/23/2014 0955   TRIG 237.0* 09/23/2014 0955   HDL 44.20 09/23/2014 0955   CHOLHDL 4 09/23/2014 0955   VLDL 47.4* 09/23/2014 0955   LDLCALC 93 09/19/2013 1402    Past Medical History  Diagnosis Date  . Coronary artery disease   . Hypertension   . Hyperlipidemia   . Obesity   . Internal hemorrhoid   . Pericardial effusion   . Thyroid disease   . Back pain   . Anemia     iron def  . Thoracic aortic aneurysm     a. s/p dissection and repair @ [facility] 2003 with SJM AVR;  CTA 09/2010 desc thor Ao 5.2 x 5.3-5.5 x 5.5 cm.  . Renal insufficiency 05/16/2011  . Aortic valve prosthesis present     a. SJM - [facility] 2003, chronic  coumadin.    Past Surgical History  Procedure Laterality Date  . Impingment      right shoulder  . Abdominal aortic aneurysm repair    . Aortic valve replacement    . Knee arthroscopy      left  . Wisdom tooth extraction      Current Outpatient Prescriptions  Medication Sig Dispense Refill  . CRESTOR 40 MG tablet TAKE 1 TABLET DAILY 90 tablet 0  . fenofibrate 160 MG tablet Take 1 tablet (160 mg total) by mouth daily. 90 tablet 2  . ferrous sulfate 325 (65 FE) MG tablet Take 325 mg by mouth.    . losartan (COZAAR) 50 MG tablet Take 1 tablet (50 mg total) by mouth daily. 90 tablet 3  . metoprolol (TOPROL XL) 200 MG 24 hr tablet Take 1 tablet (200 mg total) by mouth daily. 90 tablet 3  . Multiple Vitamin (MULTIVITAMIN) tablet Take 1 tablet by mouth daily.    Marland Kitchen warfarin (COUMADIN) 5 MG tablet TAKE AS DIRECTED BY ANTICOAGULATION CLINIC 135 tablet 0   No current facility-administered medications for this visit.    Allergies  Allergen Reactions  . Celecoxib Itching and Other (See Comments)    Joint pain    History   Social History  . Marital Status: Single    Spouse Name: N/A  . Number of Children: 0  .  Years of Education: MDIV   Occupational History  . [employer]   Social History Main Topics  . Smoking status: Former Smoker -- 1.00 packs/day for 15 years    Types: Cigarettes    Quit date: 10/10/2001  . Smokeless tobacco: Never Used  . Alcohol Use: Yes     Comment: one drink per day  . Drug Use: No  . Sexual Activity: Not Currently   Other Topics Concern  . Not on file   Social History Narrative   Pt lives at home alone.   Caffeine Use: Less than 1 cup daily.    Family History  Problem Relation Age of Onset  . Liver cancer Father   . Colon cancer Neg Hx     Review of Systems:  As stated in the HPI and otherwise negative.   BP 98/60 mmHg  Pulse 62  Ht 5\' 8"  (1.727 m)  Wt 221 lb (100.245 kg)  BMI 33.61 kg/m2  Physical  Examination: General: Well developed, well nourished, NAD SKIN: warm, dry. No rashes. Neuro: No focal deficits Musculoskeletal: Muscle strength 5/5 all ext Psychiatric: Mood and affect normal Neck: No JVD, loud sounds bilateral carotids, no thyromegaly, no lymphadenopathy. Lungs:Clear bilaterally, no wheezes, rhonci, crackles Cardiovascular: Regular rate and rhythm. Valve click with systolic murmur. No gallops or rubs. Abdomen:Soft. Bowel sounds present. Non-tender.  Extremities: No lower extremity edema. Pulses are 2 + in the bilateral DP/PT.  Echo 10/28/12: Left ventricle: The cavity size was normal. Wall thickness was increased in a pattern of mild LVH. Systolic function was normal. The estimated ejection fraction was in the range of 55% to 60%. Doppler parameters are consistent with high ventricular filling pressure. - Aortic valve: A St. Jude Medical mechanical prosthesis was present. Trivial regurgitation. - Right atrium: The atrium was mildly dilated.  CTA chest 05/16/13: 1. Continued slight interval increase in the diameter of the proximal descending thoracic aortic aneurysm, currently measuring 57 mm, previously, 55 mm. 2. Stable sequela of Type A thoracic aortic dissection with extension of the dissection into the left subclavian, left vertebral artery and (at least) the left axillary arteries. The dissection is again noted to extend through the aberrant right subclavian artery to (at least) the level of the right axillary artery. The dissection does not appear to result in a hemodynamically significant narrowing of any of the involved great vessels. 3. Grossly stable sequela of open surgical repair of the aortic valve and ascending thoracic aorta. The dissection is again noted to extend caudally to involve the ascending thoracic aortic repair and abut the origin of the right coronary artery. 4. Unchanged approximately 5 mm noncalcified subpleural nodule within the  right lower lobe, stable since the 2011 examination and loss of benign etiology. 5. Unchanged extension of the thoracic aortic aneurysm throughout the entirety of the abdominal aorta. The abdominal aorta is unchanged in size. 6. 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 7. 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 No additional evidence of end organ ischemia. 8. Unchanged approximately 1.1 cm hyperattenuating left-sided renal lesion, stable since the 2011 examination and thus favored to represent a hyperdense renal cyst.  EKG: NSR, rate 62 bpm. LAE. RBBB. T wave inversions lateral leads  Assessment and Plan:   1. THORACIC AORTIC ANEURYSM: He is known to have type A aortic dissection with extension from the thoracic aorta through the iliacs involving the right  renal artery. This has been stable. Last CTA was October 2014.  Will repeat in October 2016. Will hopefully be able to avoid admission for hydration. Last creatinine was 1.5 last week. Will check BMET week of scan.   2. S/P aortic valve replacement: Mechanical AVR in place. On chronic coumadin therapy. Valve functioning well by echo 2014.

## 2014-10-02 ENCOUNTER — Other Ambulatory Visit (INDEPENDENT_AMBULATORY_CARE_PROVIDER_SITE_OTHER): Payer: BLUE CROSS/BLUE SHIELD

## 2014-10-02 ENCOUNTER — Ambulatory Visit (INDEPENDENT_AMBULATORY_CARE_PROVIDER_SITE_OTHER): Payer: BLUE CROSS/BLUE SHIELD | Admitting: Family Medicine

## 2014-10-02 ENCOUNTER — Encounter: Payer: Self-pay | Admitting: Family Medicine

## 2014-10-02 VITALS — BP 114/80 | HR 70 | Ht 68.0 in | Wt 222.0 lb

## 2014-10-02 DIAGNOSIS — M25511 Pain in right shoulder: Secondary | ICD-10-CM

## 2014-10-02 DIAGNOSIS — M6752 Plica syndrome, left knee: Secondary | ICD-10-CM

## 2014-10-02 DIAGNOSIS — M67911 Unspecified disorder of synovium and tendon, right shoulder: Secondary | ICD-10-CM

## 2014-10-02 MED ORDER — DICLOFENAC SODIUM 2 % TD SOLN: TRANSDERMAL | Status: DC

## 2014-10-02 NOTE — Assessment & Plan Note (Signed)
Home exercises given, we discussed bracing the patient declined. We discussed formal physical therapy or injection which patient also declined. Patient will start with the home exercises, topical anti-inflammatories and over-the-counter natural supplementations and patient will come back and see me again in 3 weeks for further evaluation. Continued have pain we'll try injection. Avoid anti-inflammatories due to patient's other comorbidities.

## 2014-10-02 NOTE — Patient Instructions (Signed)
Very nice to meet you Ice 20 minutes 2 times daily. Usually after activity and before bed. Exercises 3 times a week. Alternate knee and shoulder Pennsaid twice daily and call the number Vitamin D 2000 IU daily Consider orthotics for your feet at some point.  Biking would be great See me again in 3 weeks.

## 2014-10-02 NOTE — Progress Notes (Signed)
[name] [address] [place], [place] [address] Phone: [phone] Subjective:    I'm seeing this patient by the request  of:  Cathlean Cower, MD   CC: Right shoulder and left knee pain  Austin [name] is a 59 y.o. male coming in with complaint of right shoulder pain and left knee pain. Patient's right shoulder pain has been going on for years. Patient states that he can be pain free and only certain movements given severe pain. Seems that reaching out from his body is most likely to cause it. Patient denies any radiation down the arm or any numbness or tingling or any associated neck pain. Patient has tried Tylenol with some mild benefit. Patient rates the severity of pain is 4 out of 10 in severity.  Patient is also complaining of left knee pain. Patient states multiple years ago he did have a meniscal tear and did have microscopic removal of the meniscus he states. Patient denies any radiation of pain but states that going up or downstairs seems to be more painful. Patient states that hurts near the knee And more on the middle aspect. Denies any popping, clicking, or any giving way on him.       Past medical history, social, surgical and family history all reviewed in electronic medical record.   Review of Systems: No headache, visual changes, nausea, vomiting, diarrhea, constipation, dizziness, abdominal pain, skin rash, fevers, chills, night sweats, weight loss, swollen lymph nodes, body aches, joint swelling, muscle aches, chest pain, shortness of breath, mood changes.   Objective Blood pressure 114/80, pulse 70, height 5\' 8"  (1.727 m), weight 222 lb (100.699 kg), SpO2 98 %.  General: No apparent distress alert and oriented x3 mood and affect normal, dressed appropriately.  HEENT: Pupils equal, extraocular movements intact  Respiratory: Patient's speak in full sentences and does not appear short of breath  Cardiovascular: No lower  extremity edema, non tender, no erythema  Skin: Warm dry intact with no signs of infection or rash on extremities or on axial skeleton.  Abdomen: Soft nontender  Neuro: Cranial nerves II through XII are intact, neurovascularly intact in all extremities with 2+ DTRs and 2+ pulses.  Lymph: No lymphadenopathy of posterior or anterior cervical chain or axillae bilaterally.  Gait normal with good balance and coordination.  MSK:  Non tender with full range of motion and good stability and symmetric strength and tone of  elbows, wrist, hip, and ankles bilaterally.  Shoulder: Right Inspection reveals no abnormalities, atrophy or asymmetry. Palpation is normal with no tenderness over AC joint or bicipital groove. ROM is full in all planes passively. Rotator cuff strength normal throughout. signs of impingement with positive Neer and Hawkin's tests, but negative empty can sign. Speeds and Yergason's tests normal. No labral pathology noted with negative Obrien's, negative clunk and good stability. Normal scapular function observed. No painful arc and no drop arm sign. No apprehension sign ContraLateral shoulder unremarkable  Knee: Right Normal to inspection with no erythema or effusion or obvious bony abnormalities. Patient is tenderness over the superior medial aspect of the patella. Plica noted ROM full in flexion and extension and lower leg rotation. Ligaments with solid consistent endpoints including ACL, PCL, LCL, MCL. Negative Mcmurray's, Apley's, and Thessalonian tests. Non painful patellar compression. Patellar glide without crepitus. Patellar and quadriceps tendons unremarkable. Hamstring and quadriceps strength is normal.     MSK US performed of: Right This study was ordered, performed, and interpreted  by Charlann Boxer D.O.  Shoulder:   Supraspinatus:  Degenerative changes noted, Bursal bulge seen with shoulder abduction on impingement view. Infraspinatus:  Appears normal on long  and transverse views. Significant increase in Doppler flow Subscapularis:  Intersubstance tearing Positive bursa Teres Minor:  Appears normal on long and transverse views. AC joint:  Moderate osteophytic changes Glenohumeral Joint:  Appears normal without effusion. Glenoid Labrum:  Intact without visualized tears. Biceps Tendon:  Appears normal on long and transverse views, no fraying of tendon, tendon located in intertubercular groove, no subluxation with shoulder internal or external rotation.  Impression: Subacromial bursitis, degenerative changes in intersubstance tearing of the rotator cuff  Procedure: Real-time Ultrasound Guided Injection of right glenohumeral joint Device: GE Logiq E  Ultrasound guided injection is preferred based studies that show increased duration, increased effect, greater accuracy, decreased procedural pain, increased response rate with ultrasound guided versus blind injection.  Verbal informed consent obtained.  Time-out conducted.  Noted no overlying erythema, induration, or other signs of local infection.  Skin prepped in a sterile fashion.  Local anesthesia: Topical Ethyl chloride.  With sterile technique and under real time ultrasound guidance:  Joint visualized.  23g 1  inch needle inserted posterior approach. Pictures taken for needle placement. Patient did have injection of 2 cc of 1% lidocaine, 2 cc of 0.5% Marcaine, and 1.0 cc of Kenalog 40 mg/dL. Completed without difficulty  Pain immediately resolved suggesting accurate placement of the medication.  Advised to call if fevers/chills, erythema, induration, drainage, or persistent bleeding.  Images permanently stored and available for review in the ultrasound unit.  Impression: Technically successful ultrasound guided injection.  Procedure note 42595; 15 minutes spent for Therapeutic exercises as stated in above notes.  This included exercises focusing on stretching, strengthening, with significant  focus on eccentric aspects.   Proper technique shown and discussed handout in great detail with ATC.  All questions were discussed and answered.      Impression and Recommendations:     This case required medical decision making of moderate complexity.

## 2014-10-02 NOTE — Assessment & Plan Note (Signed)
Patient was given an injection today. We discussed home exercises and patient did work with Product/process development scientist. We discussed icing regimen as well as topical anti-inflammatory's. Patient will avoid oral anti-inflammatories, patient will come back and see me again in 3 weeks.

## 2014-10-02 NOTE — Progress Notes (Signed)
Pre visit review using our clinic review tool, if applicable. No additional management support is needed unless otherwise documented below in the visit note. 

## 2014-10-08 ENCOUNTER — Other Ambulatory Visit: Payer: Self-pay | Admitting: Internal Medicine

## 2014-10-21 ENCOUNTER — Ambulatory Visit (INDEPENDENT_AMBULATORY_CARE_PROVIDER_SITE_OTHER): Payer: BLUE CROSS/BLUE SHIELD | Admitting: General Practice

## 2014-10-21 DIAGNOSIS — Z5181 Encounter for therapeutic drug level monitoring: Secondary | ICD-10-CM | POA: Diagnosis not present

## 2014-10-21 DIAGNOSIS — Z7901 Long term (current) use of anticoagulants: Secondary | ICD-10-CM | POA: Diagnosis not present

## 2014-10-21 DIAGNOSIS — Z954 Presence of other heart-valve replacement: Secondary | ICD-10-CM | POA: Diagnosis not present

## 2014-10-21 DIAGNOSIS — Z952 Presence of prosthetic heart valve: Secondary | ICD-10-CM

## 2014-10-21 LAB — POCT INR: INR: 2.5

## 2014-10-21 NOTE — Progress Notes (Signed)
Pre visit review using our clinic review tool, if applicable. No additional management support is needed unless otherwise documented below in the visit note. 

## 2014-10-21 NOTE — Progress Notes (Signed)
Agree with plan 

## 2014-10-24 ENCOUNTER — Other Ambulatory Visit: Payer: Self-pay | Admitting: Internal Medicine

## 2014-10-26 ENCOUNTER — Encounter: Payer: Self-pay | Admitting: Family Medicine

## 2014-10-26 ENCOUNTER — Ambulatory Visit (INDEPENDENT_AMBULATORY_CARE_PROVIDER_SITE_OTHER): Payer: BLUE CROSS/BLUE SHIELD | Admitting: Family Medicine

## 2014-10-26 ENCOUNTER — Ambulatory Visit (INDEPENDENT_AMBULATORY_CARE_PROVIDER_SITE_OTHER)
Admission: RE | Admit: 2014-10-26 | Discharge: 2014-10-26 | Disposition: A | Discharge status: Home / Self Care | Payer: BLUE CROSS/BLUE SHIELD | Source: Ambulatory Visit | Attending: Family Medicine | Admitting: Family Medicine

## 2014-10-26 VITALS — BP 110/80 | HR 63 | Ht 68.0 in | Wt 216.0 lb

## 2014-10-26 DIAGNOSIS — M25511 Pain in right shoulder: Secondary | ICD-10-CM

## 2014-10-26 DIAGNOSIS — M67911 Unspecified disorder of synovium and tendon, right shoulder: Secondary | ICD-10-CM

## 2014-10-26 DIAGNOSIS — M6752 Plica syndrome, left knee: Secondary | ICD-10-CM

## 2014-10-26 NOTE — Progress Notes (Signed)
[name] [place], [place] [address] Phone: [phone] Subjective:    I'm seeing this patient by the request  of:  Cathlean Cower, MD   CC: Right shoulder and left knee pain  Austin [name] is a 59 y.o. male coming in with complaint of right shoulder pain and left knee pain. Patient's right shoulder pain.  Patient was found to have some intersubstance tearing of the rotator cuff. Patient was given an injection. Patient has been doing home exercises regularly and would state that he is approximately 30% better. Patient denies any new symptoms but states that unfortunately continues to have difficulty even falling asleep on the side at night.  Patient is also complaining of left knee pain. Patient states multiple years ago he did have a meniscal tear and did have microscopic removal of the meniscus he states. Patient denies any radiation of pain but states that going up or downstairs seems to be more painful. Patient states that hurts near the knee And more on the middle aspect. Denies any popping, clicking, or any giving way on him. Patient states that his knee is approximately 50% better with home exercises. Patient states that there is no clicking, giving out on him, and not stopping him from any activities. Patient has started to increase his activity slowly and has had discomfort but states that once again no severe debilitating pain.       Past medical history, social, surgical and family history all reviewed in electronic medical record.   Review of Systems: No headache, visual changes, nausea, vomiting, diarrhea, constipation, dizziness, abdominal pain, skin rash, fevers, chills, night sweats, weight loss, swollen lymph nodes, body aches, joint swelling, muscle aches, chest pain, shortness of breath, mood changes.   Objective Blood pressure 110/80, pulse 63, height 5\' 8"  (1.727 m), weight 216 lb (97.977 kg), SpO2 99 %.    General: No apparent distress alert and oriented x3 mood and affect normal, dressed appropriately.  HEENT: Pupils equal, extraocular movements intact  Respiratory: Patient's speak in full sentences and does not appear short of breath  Cardiovascular: No lower extremity edema, non tender, no erythema  Skin: Warm dry intact with no signs of infection or rash on extremities or on axial skeleton.  Abdomen: Soft nontender  Neuro: Cranial nerves II through XII are intact, neurovascularly intact in all extremities with 2+ DTRs and 2+ pulses.  Lymph: No lymphadenopathy of posterior or anterior cervical chain or axillae bilaterally.  Gait normal with good balance and coordination.  MSK:  Non tender with full range of motion and good stability and symmetric strength and tone of  elbows, wrist, hip, and ankles bilaterally.  Shoulder: Right Inspection reveals no abnormalities, atrophy or asymmetry. Palpation is normal with no tenderness over AC joint or bicipital groove. ROM is full in all planes passively. Rotator cuff strength normal throughout. signs of impingement with positive Neer and Hawkin's tests, but negative empty can sign. Mild improvement from previous exam Speeds and Yergason's tests normal. No labral pathology noted with negative Obrien's, negative clunk and good stability. Normal scapular function observed. No painful arc and no drop arm sign. No apprehension sign ContraLateral shoulder unremarkable Mild improvement from previous exam  Knee: Right Normal to inspection with no erythema or effusion or obvious bony abnormalities. Patient is tenderness over the superior medial aspect of the patella. Plica noted still. Less tender than previous exam. ROM full in flexion and extension and lower leg rotation.  Ligaments with solid consistent endpoints including ACL, PCL, LCL, MCL. Negative Mcmurray's, Apley's, and Thessalonian tests. Non painful patellar compression. Patellar glide  without crepitus. Patellar and quadriceps tendons unremarkable. Hamstring and quadriceps strength is normal.  Contralateral knee unremarkable.         Impression and Recommendations:     This case required medical decision making of moderate complexity.

## 2014-10-26 NOTE — Assessment & Plan Note (Signed)
Patient has not made significant change from previous exam. I would expect him to be better at this time. Patient will be referred to formal physical therapy and x-rays ordered today to rule out any other bony abnormality that can be contributing. If patient does not make any significant improvement or any worsening symptoms advance imaging may be necessary. Patient and will follow-up with me again in 4 weeks for further evaluation and treatment.

## 2014-10-26 NOTE — Patient Instructions (Signed)
Good to see you Ice 20 minutes 2 times daily. Usually after activity and before bed. Continue the exercises The shoulder will get xray and PT will be calling you Continue with the knee and we will get you in orthotics.  See me again in 3-4 weeks.

## 2014-10-26 NOTE — Assessment & Plan Note (Signed)
Still getting discomfort. 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 We encouraged patient continue to concentrate on this area. We discussed the possibility of injection. We discussed icing regimen and wide this is important. Patient will try to make these different changes and come back and see me again in 3-4 weeks. If continuing to have pain we will consider an injection.

## 2014-10-26 NOTE — Progress Notes (Signed)
Pre visit review using our clinic review tool, if applicable. No additional management support is needed unless otherwise documented below in the visit note. 

## 2014-11-05 ENCOUNTER — Ambulatory Visit: Payer: BLUE CROSS/BLUE SHIELD | Attending: Family Medicine | Admitting: Physical Therapy

## 2014-11-05 DIAGNOSIS — M6281 Muscle weakness (generalized): Secondary | ICD-10-CM

## 2014-11-05 DIAGNOSIS — R293 Abnormal posture: Secondary | ICD-10-CM

## 2014-11-05 DIAGNOSIS — M25611 Stiffness of right shoulder, not elsewhere classified: Secondary | ICD-10-CM

## 2014-11-05 DIAGNOSIS — M25511 Pain in right shoulder: Secondary | ICD-10-CM | POA: Diagnosis not present

## 2014-11-05 NOTE — Therapy (Signed)
[facility] [place], [place], [address] Phone: [phone]   Fax:  [phone]  Physical Therapy Evaluation  Patient Details  Name: Austin [name] MRN: [id number] Date of Birth: 1955/12/09 Referring Provider:  Lyndal Pulley, DO  Encounter Date: 11/05/2014      PT End of Session - 11/05/14 0930    Visit Number 1   Number of Visits 12   Date for PT Re-Evaluation 12/17/14   PT Start Time 0845   PT Stop Time 0931   PT Time Calculation (min) 46 min   Activity Tolerance Patient tolerated treatment well   Behavior During Therapy [facility] for tasks assessed/performed      Past Medical History  Diagnosis Date  . Coronary artery disease   . Hypertension   . Hyperlipidemia   . Obesity   . Internal hemorrhoid   . Pericardial effusion   . Thyroid disease   . Back pain   . Anemia     iron def  . Thoracic aortic aneurysm     a. s/p dissection and repair @ [facility] 2003 with SJM AVR;  CTA 09/2010 desc thor Ao 5.2 x 5.3-5.5 x 5.5 cm.  . Renal insufficiency 05/16/2011  . Aortic valve prosthesis present     a. SJM - [facility] 2003, chronic coumadin.    Past Surgical History  Procedure Laterality Date  . Impingment      right shoulder  . Abdominal aortic aneurysm repair    . Aortic valve replacement    . Knee arthroscopy      left  . Wisdom tooth extraction      There were no vitals filed for this visit.  Visit Diagnosis:  Right shoulder pain - Plan: PT plan of care cert/re-cert  Muscle weakness of right upper extremity - Plan: PT plan of care cert/re-cert  Decreased ROM of right shoulder - Plan: PT plan of care cert/re-cert  Abnormal posture - Plan: PT plan of care cert/re-cert      Subjective Assessment - 11/05/14 0855    Subjective pt is a 59 y.o M with CC of R shoulder pain. He reports that the R shoulder has been bothering him for the last couple of years.    Limitations Lifting   How long can you sit  comfortably? unlimited   How long can you stand comfortably? unlimited   How long can you walk comfortably? unlimited   Diagnostic tests X-ray 11/02/14 pt reported physician hasn't called him yet   Patient Stated Goals to get full movement out of the shoulder, To decrease pain.    Currently in Pain? Yes   Pain Score 2    Pain Location Shoulder   Pain Orientation Right   Pain Descriptors / Indicators Sharp  during lifting   Pain Type Chronic pain   Pain Onset More than a month ago   Pain Frequency Intermittent   Aggravating Factors  lifting the arm over head, carrying    Pain Relieving Factors let the arm rest   Effect of Pain on Daily Activities limited strength and lifting ability   Multiple Pain Sites Yes   Pain Score 1   Pain Location Knee   Pain Orientation Left   Pain Descriptors / Indicators Aching  popping   Pain Type Chronic pain   Pain Onset More than a month ago   Pain Frequency Intermittent   Aggravating Factors  ascending and descending stairs, kneeling and bending.    Pain  Relieving Factors voltaren and rest            [facility] PT Assessment - 11/05/14 0903    Assessment   Medical Diagnosis R shoulder rotator cuff and pain   Onset Date --  per pt report over many years   Next MD Visit 11/09/2014   Prior Therapy yes, Left knee   Precautions   Precautions None   Restrictions   Weight Bearing Restrictions No   Balance Screen   Has the patient fallen in the past 6 months No   [place] Private residence   Living Arrangements Alone   Type of [place] to enter   Entrance Stairs-Number of Steps 2   Entrance Stairs-Rails None   Home Layout One level   Prior Function   Level of Independence Independent with basic ADLs;Independent with homemaking with ambulation;Independent with gait;Independent with transfers   Vocation 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 [employer] Requirements sitting, walking,    Leisure cooking, going to [employer], taking the dog for walks   Observation/Other Assessments   Focus on Therapeutic Outcomes (FOTO)  36% limitation (projected to increase to 33%)   Posture/Postural Control   Posture/Postural Control Postural limitations   Postural Limitations Rounded Shoulders;Forward head   ROM / Strength   AROM / PROM / Strength AROM;PROM;Strength   AROM   AROM Assessment Site Shoulder   Right/Left Shoulder Right;Left   Right Shoulder Extension 38 Degrees   Right Shoulder Flexion 178 Degrees   Right Shoulder ABduction 94 Degrees   Right Shoulder Internal Rotation 70 Degrees   Right Shoulder External Rotation 90 Degrees   Left Shoulder Extension 40 Degrees   Left Shoulder Flexion 177 Degrees   Left Shoulder ABduction 178 Degrees   Left Shoulder Internal Rotation 94 Degrees   Left Shoulder External Rotation 68 Degrees   PROM   PROM Assessment Site Shoulder   Right/Left Shoulder Right   Right Shoulder ABduction 160 Degrees  with pain noted at end range   Strength   Strength Assessment Site Shoulder   Right/Left Shoulder Right;Left   Right Shoulder Flexion 4/5   Right Shoulder Extension 4/5   Right Shoulder ABduction 2/5   Right Shoulder Internal Rotation 4/5   Right Shoulder External Rotation 4/5   Left Shoulder Flexion 4-/5   Left Shoulder Extension 4/5   Left Shoulder ABduction 4/5   Left Shoulder Internal Rotation --  4+   Left Shoulder External Rotation 4+/5   Palpation   Palpation Tenderness at the proxmiall tendon of the longed head of the bicps/ biceptial groove   Neer Impingement test    Findings Positive   Side Right   Hawkins-Kennedy test   Findings Positive   Side Right   Empty Can test   Findings Positive   Side Right   Full Can test   Findings Negative   Speed's test   Findings Negative   Side Right   Painful Arc of Motion   Findings Positive   Side Right                    [facility] Adult PT Treatment/Exercise - 11/05/14 0903    Shoulder Exercises: Supine   Protraction AROM;Strengthening;Right;10 reps;Weights  ceiling punches   Shoulder Exercises: Standing   External Rotation AROM;Strengthening;Right;10 reps;Theraband   Theraband Level (Shoulder External Rotation) Level 2 (Red)   Internal  Rotation AROM;Strengthening;Right;10 reps;Theraband   Theraband Level (Shoulder Internal Rotation) Level 2 (Red)   Row AROM;Strengthening;10 reps;Theraband   Theraband Level (Shoulder Row) Level 2 (Red)                PT Education - 11/05/14 0930    Education provided Yes   Education Details evaluation findings, POC, goals, HEP   Person(s) Educated Patient   Methods Explanation;Demonstration   Comprehension Returned demonstration;Verbalized understanding          PT Short Term Goals - 11/05/14 0945    PT SHORT TERM GOAL #1   Title pt will be I with Basic HEP (11/26/2014)   Time 3   Period Weeks   Status New           PT Long Term Goals - 11/05/14 0945    PT LONG TERM GOAL #1   Title pt will be I with advanced HEP (12/17/2014)   Time 6   Period Weeks   Status New   PT LONG TERM GOAL #2   Title pt will increase R shoulder abduction to >160 degrees actively to assist with overhead activities (12/17/2014)    Time 6   Period Weeks   Status New   PT LONG TERM GOAL #3   Title pt will decrease pain to <2/10 during and following overhead lifting activitites to assist with ADLs (12/17/2014)   Time 6   Period Weeks   Status New   PT LONG TERM GOAL #4   Title Pt will increase general  R shoulder strenght to >4+/5 to help with lifting and carrying activities (12/17/2014)   Time 6   Period Weeks   Status New   PT LONG TERM GOAL #5   Title pt will be able to verbalize and demonstrate techniques to reduce risk of R shoulder reinjury by postural awareness, lifting and carrying mechanics, and HEP (12/17/2014)   Time 6   Period Weeks   Status New                Plan - 11/05/14 0931    Clinical Impression Statement Jawuan presents to OPPT with CC of R shoulder pain. He has full functional AROM in all planes except for abduction actively at 94 degrees and passively 160 degrees with pain at end range. R shoulder general strength is a 4/5 and 2/5 with R shoulder abduction limited by increased pain. He demonstrates abnormal posture of forward head posture and ant rolled shoulders. He is tender at the proximal aspect of the long head of the biceps tendon/ bicepital groove. Special  testing was postivie with neers, empty can, [employer], and scapular assist which are consistent with impingement pathology. He would benefit from skilled physical therapy to maximize his function by addressing the impairments listed.    Pt will benefit from skilled therapeutic intervention in order to improve on the following deficits Impaired UE functional use;Decreased strength;Pain;Decreased endurance;Decreased activity tolerance;Decreased range of motion;Postural dysfunction   Rehab Potential Excellent   PT Frequency 2x / week   PT Duration 6 weeks   PT Treatment/Interventions ADLs/Self Care Home Management;Therapeutic activities;Patient/family education;Passive range of motion;Electrical Stimulation;Cryotherapy;Neuromuscular re-education;Manual techniques;Ultrasound;Therapeutic exercise   PT Next Visit Plan Assess response to HEP, Scapular stabilization exercise, rotator cuff strengthening, modalites for pain PRN.    PT Home Exercise Plan standing internal/external rotation, ceiling punches, and rows.    Consulted and Agree with Plan of Care Patient         Problem List Patient  Active Problem List   Diagnosis Date Noted  . Plica syndrome of left knee 10/02/2014  . Right shoulder pain 09/23/2014  . Left knee pain 09/23/2014  . Rash and nonspecific skin eruption 09/23/2014  . Encounter for therapeutic drug monitoring 09/24/2013  . Aortic valve  prosthesis present   . Thoracic aortic aneurysm   . Left-sided sensorineural hearing loss 12/27/2012  . Embolic stroke 16/01/3709  . Chronic anticoagulation 09/16/2012  . CKD (chronic kidney disease) 05/16/2011  . Preventative health care 05/14/2011  . CAROTID BRUIT 10/14/2010  . S/P aortic valve replacement 09/20/2010  . LUMBAR RADICULOPATHY, RIGHT 08/12/2009  . ILIAC ARTERY ANEURYSM 04/02/2009  . Unspecified Iron Deficiency Anemia 10/29/2007  . EROSIVE GASTRITIS 05/03/2007  . HYPOTHYROIDISM, SECONDARY 03/05/2007  . HYPERLIPIDEMIA 03/05/2007  . OBESITY 03/05/2007  . HYPERTENSION 03/05/2007  . CORONARY ARTERY DISEASE 03/05/2007  . PERICARDIAL EFFUSION 03/05/2007  . Internal hemorrhoids without mention of complication 62/69/4854  . LOW BACK PAIN 03/05/2007  . Tendinopathy of rotator cuff 03/05/2007   Starr Lake PT, DPT, LAT, ATC  11/05/2014  9:54 AM   [facility] [address] [place], [place], [address] Phone: [phone]   Fax:  [phone]

## 2014-11-05 NOTE — Patient Instructions (Signed)
   Elloise Roark PT, DPT, LAT, ATC  [facility] Phone: [phone]     

## 2014-11-09 ENCOUNTER — Encounter: Payer: Self-pay | Admitting: Family Medicine

## 2014-11-09 ENCOUNTER — Ambulatory Visit (INDEPENDENT_AMBULATORY_CARE_PROVIDER_SITE_OTHER): Payer: BLUE CROSS/BLUE SHIELD | Admitting: Family Medicine

## 2014-11-09 DIAGNOSIS — M25562 Pain in left knee: Secondary | ICD-10-CM | POA: Diagnosis not present

## 2014-11-09 NOTE — Progress Notes (Signed)
Patient was fitted for a : standard, cushioned, semi-rigid orthotic. The orthotic was heated and afterward the patient was in a seated position and the orthotic molded. The patient was positioned in subtalar neutral position and 10 degrees of ankle dorsiflexion in a non-weight bearing stance. After completion of molding, patient did have orthotic management which included instructions on acclimating to the orthotics, signs of ill fit as well as care for the orthotic.  I spent 30 minutes meeting with the patient to discuss proper fit and care of the orthotics. As well as discussing what to expect as he adjusts to wearing the orthotics due to his significant pes planus feet.   The blank was ground to a stable position for weight bearing. Size: 10 (Igli Active)  Base: Carbon fiber Additional Posting and Padding: The following postings were fitted onto the molded orthotics to help maintain a talar neutral position - Wedge posting for transverse arch: None      Silicone posting for longitudinal arch: 250/100 x2 bilaterally  The patient ambulated these, and they were very comfortable and supportive.

## 2014-11-09 NOTE — Assessment & Plan Note (Signed)
Due to patient's over pronation of feet and do think that this is likely causing patient to have the left knee pain secondary to valgus deformity of the knee. We discussed home exercises, as well as wearing these orthotics slowly over the course of time until patient is used to them. Patient and will come back in 4-6 weeks for further evaluation and treatment.

## 2014-11-09 NOTE — Patient Instructions (Signed)

## 2014-11-17 ENCOUNTER — Ambulatory Visit: Payer: BLUE CROSS/BLUE SHIELD | Admitting: Physical Therapy

## 2014-11-17 DIAGNOSIS — M25511 Pain in right shoulder: Secondary | ICD-10-CM | POA: Diagnosis not present

## 2014-11-17 DIAGNOSIS — M25611 Stiffness of right shoulder, not elsewhere classified: Secondary | ICD-10-CM

## 2014-11-17 DIAGNOSIS — M6281 Muscle weakness (generalized): Secondary | ICD-10-CM

## 2014-11-17 DIAGNOSIS — R293 Abnormal posture: Secondary | ICD-10-CM

## 2014-11-17 NOTE — Patient Instructions (Signed)
CHEST: Doorway, Bilateral - Standing   Standing in doorway, place hands on wall with elbows bent at shoulder height. Lean forward. Hold _30__ seconds. _3__ reps per set, _2__ sets per day, _7__ days per week  Copyright  VHI. All rights reserved.  Chest Fly   WITH GREEN THERABAND. Lie on back with knees bent, arms extended to sides and slightly bent, palms facing up. Inhale, then exhale while bringing weights together, arms straight. Hold 1 count. Slowly return to starting position. Repeat __20__ times per set. Do __1__ sets per session. Do _7___ sessions per week. May be done with dumbbells, tubing or resistive band.  Copyright  VHI. All rights reserved.      PNF Strengthening: Resisted   Standing with resistive band around each hand, bring right arm up and away, thumb back. Repeat __20__ times per set. Do __1__ sets per session. Do __7__ sessions per day.  [email or web address].[email or web address]

## 2014-11-17 NOTE — Therapy (Signed)
[place], [place], [address] Phone: [phone]   Fax:  [phone]  Physical Therapy Treatment  Patient Details  Name: Austin [name] MRN: [id number] Date of Birth: 1956/02/13 Referring Provider:  [name], MD  Encounter Date: 11/17/2014      PT End of Session - 11/17/14 1543    Visit Number 2   Number of Visits 12   Date for PT Re-Evaluation 12/17/14   PT Start Time 0342   PT Stop Time 0420   PT Time Calculation (min) 38 min      Past Medical History  Diagnosis Date  . Coronary artery disease   . Hypertension   . Hyperlipidemia   . Obesity   . Internal hemorrhoid   . Pericardial effusion   . Thyroid disease   . Back pain   . Anemia     iron def  . Thoracic aortic aneurysm     a. s/p dissection and repair @ [facility] 2003 with SJM AVR;  CTA 09/2010 desc thor Ao 5.2 x 5.3-5.5 x 5.5 cm.  . Renal insufficiency 05/16/2011  . Aortic valve prosthesis present     a. SJM - [facility] 2003, chronic coumadin.    Past Surgical History  Procedure Laterality Date  . Impingment      right shoulder  . Abdominal aortic aneurysm repair    . Aortic valve replacement    . Knee arthroscopy      left  . Wisdom tooth extraction      There were no vitals filed for this visit.  Visit Diagnosis:  Right shoulder pain  Muscle weakness of right upper extremity  Decreased ROM of right shoulder  Abnormal posture      Subjective Assessment - 11/17/14 1543    Subjective Pain only happens when I move it certain ways.    Currently in Pain? No/denies   Aggravating Factors  over head reaching            [facility] PT Assessment - 11/17/14 1602    AROM   Right Shoulder ABduction 175 Degrees                     [facility] Adult PT Treatment/Exercise - 11/17/14 1546    Shoulder Exercises: Supine   Horizontal ABduction Strengthening;Both;20 reps;Theraband   Theraband Level (Shoulder Horizontal  ABduction) Level 3 (Green)   External Rotation Strengthening;20 reps;Theraband   Theraband Level (Shoulder External Rotation) Level 3 (Green)   Flexion Strengthening;10 reps;Theraband  with opposite arm extension   Theraband Level (Shoulder Flexion) Level 3 (Green)   Other Supine Exercises diagonals 10 reps each green theraband   Shoulder Exercises: Sidelying   External Rotation Strengthening;Right;20 reps;Weights   External Rotation Weight (lbs) 2   ABduction AROM;Right;20 reps   ABduction Limitations 10 reps above and belowe painful arc ~60-70 degrees   Shoulder Exercises: Standing   External Rotation Strengthening;Right;20 reps;Theraband   Theraband Level (Shoulder External Rotation) Level 2 (Red)   Internal Rotation Strengthening;Right;20 reps;Theraband   Theraband Level (Shoulder Internal Rotation) Level 2 (Red)   Extension Strengthening;Both;10 reps   Row Strengthening;Both;20 reps;Theraband   Theraband Level (Shoulder Row) Level 2 (Red)   Shoulder Exercises: ROM/Strengthening   UBE (Upper Arm Bike) Level 2.0 2 min forward and 2 min back   Wall Pushups 5 reps   Wall Pushups Limitations pain   Prot/Ret//Elev/Dep hand and elbows x 10 each for protract/retract  tactile cues and verbal  cues                PT Education - 11/17/14 1618    Education provided Yes   Education Details Corner stretch, supine shoulder horizontal abduction and diagonals HEP   Person(s) Educated Patient   Methods Explanation;Demonstration;Verbal cues;Handout   Comprehension Verbalized understanding;Returned demonstration          PT Short Term Goals - 11/17/14 1545    PT SHORT TERM GOAL #1   Title pt will be I with Basic HEP (11/26/2014)   Time 3   Period Weeks   Status On-going           PT Long Term Goals - 11/17/14 1625    PT LONG TERM GOAL #1   Title pt will be I with advanced HEP (12/17/2014)   Time 6   Period Weeks   Status On-going   PT LONG TERM GOAL #2   Title pt will  increase R shoulder abduction to >160 degrees actively to assist with overhead activities (12/17/2014)    Time 6   Period Weeks   Status Achieved   PT LONG TERM GOAL #3   Title pt will decrease pain to <2/10 during and following overhead lifting activitites to assist with ADLs (12/17/2014)   Time 6   Period Weeks   Status On-going   PT LONG TERM GOAL #4   Title Pt will increase general  R shoulder strenght to >4+/5 to help with lifting and carrying activities (12/17/2014)   Time 6   Period Weeks   Status On-going   PT LONG TERM GOAL #5   Title pt will be able to verbalize and demonstrate techniques to reduce risk of R shoulder reinjury by postural awareness, lifting and carrying mechanics, and HEP (12/17/2014)   Time 6   Period Weeks   Status On-going               Plan - 11/17/14 1545    Clinical Impression Statement Pt reports he can reach a little higher before onset of pain. Independent with initial HEP STG# 1 LTG # 2 Met.  Significant increase in abduction AROM however continues to have painful arc of motion. Progressed scapular stab with minimal c/o increased pain and pt reports no increased pain after treatment, only muscle fatigue.    PT Next Visit Plan Scapular stabilization exercise, rotator cuff strengthening, modalites for pain PRN.         Problem List Patient Active Problem List   Diagnosis Date Noted  . Plica syndrome of left knee 10/02/2014  . Right shoulder pain 09/23/2014  . Left knee pain 09/23/2014  . Rash and nonspecific skin eruption 09/23/2014  . Encounter for therapeutic drug monitoring 09/24/2013  . Aortic valve prosthesis present   . Thoracic aortic aneurysm   . Left-sided sensorineural hearing loss 12/27/2012  . Embolic stroke 93/81/0175  . Chronic anticoagulation 09/16/2012  . CKD (chronic kidney disease) 05/16/2011  . Preventative health care 05/14/2011  . CAROTID BRUIT 10/14/2010  . S/P aortic valve replacement 09/20/2010  . LUMBAR  RADICULOPATHY, RIGHT 08/12/2009  . ILIAC ARTERY ANEURYSM 04/02/2009  . Unspecified Iron Deficiency Anemia 10/29/2007  . EROSIVE GASTRITIS 05/03/2007  . HYPOTHYROIDISM, SECONDARY 03/05/2007  . HYPERLIPIDEMIA 03/05/2007  . OBESITY 03/05/2007  . HYPERTENSION 03/05/2007  . CORONARY ARTERY DISEASE 03/05/2007  . PERICARDIAL EFFUSION 03/05/2007  . Internal hemorrhoids without mention of complication 05/24/8526  . LOW BACK PAIN 03/05/2007  . Tendinopathy of rotator cuff 03/05/2007  Dorene Ar, [place] 11/17/2014, 4:26 PM  [place], [place], [address] Phone: [phone]   Fax:  [phone]

## 2014-11-18 ENCOUNTER — Ambulatory Visit: Payer: BLUE CROSS/BLUE SHIELD

## 2014-11-19 ENCOUNTER — Ambulatory Visit: Payer: BLUE CROSS/BLUE SHIELD | Admitting: Physical Therapy

## 2014-11-19 DIAGNOSIS — R293 Abnormal posture: Secondary | ICD-10-CM

## 2014-11-19 DIAGNOSIS — M25511 Pain in right shoulder: Secondary | ICD-10-CM

## 2014-11-19 DIAGNOSIS — M25611 Stiffness of right shoulder, not elsewhere classified: Secondary | ICD-10-CM

## 2014-11-19 DIAGNOSIS — M6281 Muscle weakness (generalized): Secondary | ICD-10-CM

## 2014-11-19 NOTE — Therapy (Signed)
[place], [place], [address] Phone: [phone]   Fax:  [phone]  Physical Therapy Treatment  Patient Details  Name: Austin [name] MRN: [id number] Date of Birth: [birth year] Referring Provider:  [name], MD  Encounter Date: 11/19/2014      PT End of Session - 11/19/14 1102    Visit Number 3   Number of Visits 12   Date for PT Re-Evaluation 12/17/14   PT Start Time 1829   PT Stop Time 1100   PT Time Calculation (min) 45 min   Activity Tolerance Patient tolerated treatment well   Behavior During Therapy [facility] for tasks assessed/performed      Past Medical History  Diagnosis Date  . Coronary artery disease   . Hypertension   . Hyperlipidemia   . Obesity   . Internal hemorrhoid   . Pericardial effusion   . Thyroid disease   . Back pain   . Anemia     iron def  . Thoracic aortic aneurysm     a. s/p dissection and repair @ [facility] 2003 with SJM AVR;  CTA 09/2010 desc thor Ao 5.2 x 5.3-5.5 x 5.5 cm.  . Renal insufficiency 05/16/2011  . Aortic valve prosthesis present     a. SJM - [facility] 2003, chronic coumadin.    Past Surgical History  Procedure Laterality Date  . Impingment      right shoulder  . Abdominal aortic aneurysm repair    . Aortic valve replacement    . Knee arthroscopy      left  . Wisdom tooth extraction      There were no vitals filed for this visit.  Visit Diagnosis:  Right shoulder pain  Muscle weakness of right upper extremity  Decreased ROM of right shoulder  Abnormal posture      Subjective Assessment - 11/19/14 1019    Subjective "I've noticed things have been a little better since last visit"    Currently in Pain? Yes   Pain Score 0-No pain  0/10 before treatment   Pain Location Shoulder   Pain Orientation Right                         [facility] Adult PT Treatment/Exercise - 11/19/14 1021    Shoulder Exercises: Supine   Protraction  AROM;Strengthening;Right;15 reps;Weights   Protraction Weight (lbs) 3#   Other Supine Exercises straight arm pertubations 2 x 10 pushes   Shoulder Exercises: Prone   Other Prone Exercises I's, T's, Y's x 15 ea  2#   Shoulder Exercises: Standing   External Rotation Strengthening;Right;20 reps;Theraband   Theraband Level (Shoulder External Rotation) Level 3 (Green)   Internal Rotation Strengthening;Right;20 reps;Theraband   Theraband Level (Shoulder Internal Rotation) Level 3 (Green)   Extension Strengthening;Both;15 reps;Theraband   Theraband Level (Shoulder Extension) Level 4 (Blue)   Row Strengthening;Both;20 reps;Theraband   Theraband Level (Shoulder Row) Level 4 (Blue)   Other Standing Exercises scaption 2 x 10 with green    Other Standing Exercises --   Shoulder Exercises: ROM/Strengthening   UBE (Upper Arm Bike) L2 x 6 min  alt direction every 3 min   Wall Pushups 15 reps   Manual Therapy   Manual Therapy Joint mobilization   Joint Mobilization Scapular mobilization in all planes grade 2-3, resisted DNF patterns D1-D2                PT Education - 11/19/14  1102    Education provided Yes   Education Details I's, T's, Y's    Person(s) Educated Patient   Methods Explanation   Comprehension Verbalized understanding;Returned demonstration          PT Short Term Goals - 11/17/14 1545    PT SHORT TERM GOAL #1   Title pt will be I with Basic HEP (11/26/2014)   Time 3   Period Weeks   Status On-going           PT Long Term Goals - 11/17/14 1625    PT LONG TERM GOAL #1   Title pt will be I with advanced HEP (12/17/2014)   Time 6   Period Weeks   Status On-going   PT LONG TERM GOAL #2   Title pt will increase R shoulder abduction to >160 degrees actively to assist with overhead activities (12/17/2014)    Time 6   Period Weeks   Status Achieved   PT LONG TERM GOAL #3   Title pt will decrease pain to <2/10 during and following overhead lifting activitites to  assist with ADLs (12/17/2014)   Time 6   Period Weeks   Status On-going   PT LONG TERM GOAL #4   Title Pt will increase general  R shoulder strenght to >4+/5 to help with lifting and carrying activities (12/17/2014)   Time 6   Period Weeks   Status On-going   PT LONG TERM GOAL #5   Title pt will be able to verbalize and demonstrate techniques to reduce risk of R shoulder reinjury by postural awareness, lifting and carrying mechanics, and HEP (12/17/2014)   Time 6   Period Weeks   Status On-going               Plan - 11/19/14 1102    Clinical Impression Statement Austin [name] has made progress since the last visit. He tolerated all exercises well with no Complaint of pain. following treatment today he was able to achieve R shoulder abduction to 178 degrees actively wiht minimal intermittent pain at 90 degrees.  Added I's T's and Y's to HEP.   PT Next Visit Plan Scapular stabilization exercise, rotator cuff strengthening, modalites for pain PRN.    PT Home Exercise Plan I's, T's, Y's   Consulted and Agree with Plan of Care Patient        Problem List Patient Active Problem List   Diagnosis Date Noted  . Plica syndrome of left knee 10/02/2014  . Right shoulder pain 09/23/2014  . Left knee pain 09/23/2014  . Rash and nonspecific skin eruption 09/23/2014  . Encounter for therapeutic drug monitoring 09/24/2013  . Aortic valve prosthesis present   . Thoracic aortic aneurysm   . Left-sided sensorineural hearing loss 12/27/2012  . Embolic stroke 32/44/0102  . Chronic anticoagulation 09/16/2012  . CKD (chronic kidney disease) 05/16/2011  . Preventative health care 05/14/2011  . CAROTID BRUIT 10/14/2010  . S/P aortic valve replacement 09/20/2010  . LUMBAR RADICULOPATHY, RIGHT 08/12/2009  . ILIAC ARTERY ANEURYSM 04/02/2009  . Unspecified Iron Deficiency Anemia 10/29/2007  . EROSIVE GASTRITIS 05/03/2007  . HYPOTHYROIDISM, SECONDARY 03/05/2007  . HYPERLIPIDEMIA 03/05/2007  . OBESITY  03/05/2007  . HYPERTENSION 03/05/2007  . CORONARY ARTERY DISEASE 03/05/2007  . PERICARDIAL EFFUSION 03/05/2007  . Internal hemorrhoids without mention of complication 72/53/6644  . LOW BACK PAIN 03/05/2007  . Tendinopathy of rotator cuff 03/05/2007   Iniya Matzek PT, DPT, LAT, ATC  11/19/2014  11:08 AM  [place], [place], [address] Phone: [phone]   Fax:  [phone]

## 2014-11-19 NOTE — Patient Instructions (Signed)
   Nadalyn Deringer PT, DPT, LAT, ATC  [facility] Phone: [phone]     

## 2014-11-20 ENCOUNTER — Other Ambulatory Visit (HOSPITAL_COMMUNITY): Payer: Self-pay | Admitting: Physician Assistant

## 2014-11-20 ENCOUNTER — Ambulatory Visit (HOSPITAL_COMMUNITY)
Admission: RE | Admit: 2014-11-20 | Discharge: 2014-11-20 | Disposition: A | Discharge status: Home / Self Care | Payer: BLUE CROSS/BLUE SHIELD | Source: Ambulatory Visit | Attending: Physician Assistant | Admitting: Physician Assistant

## 2014-11-20 DIAGNOSIS — C460 Kaposi's sarcoma of skin: Secondary | ICD-10-CM | POA: Diagnosis not present

## 2014-11-23 ENCOUNTER — Ambulatory Visit: Payer: BLUE CROSS/BLUE SHIELD | Admitting: Physical Therapy

## 2014-11-23 DIAGNOSIS — R293 Abnormal posture: Secondary | ICD-10-CM

## 2014-11-23 DIAGNOSIS — M25511 Pain in right shoulder: Secondary | ICD-10-CM | POA: Diagnosis not present

## 2014-11-23 DIAGNOSIS — M25611 Stiffness of right shoulder, not elsewhere classified: Secondary | ICD-10-CM

## 2014-11-23 DIAGNOSIS — M6281 Muscle weakness (generalized): Secondary | ICD-10-CM

## 2014-11-23 NOTE — Therapy (Signed)
[place], [place], [address] Phone: [phone]   Fax:  [phone]  Physical Therapy Treatment  Patient Details  Name: Austin [name] MRN: [id number] Date of Birth: April 09, 1956 Referring Provider:  [name], MD  Encounter Date: 11/23/2014      PT End of Session - 11/23/14 1036    Visit Number 4   Number of Visits 12   Date for PT Re-Evaluation 12/17/14   PT Start Time 0930   PT Stop Time 1015   PT Time Calculation (min) 45 min   Activity Tolerance Patient tolerated treatment well   Behavior During Therapy [facility] for tasks assessed/performed      Past Medical History  Diagnosis Date  . Coronary artery disease   . Hypertension   . Hyperlipidemia   . Obesity   . Internal hemorrhoid   . Pericardial effusion   . Thyroid disease   . Back pain   . Anemia     iron def  . Thoracic aortic aneurysm     a. s/p dissection and repair @ [facility] 2003 with SJM AVR;  CTA 09/2010 desc thor Ao 5.2 x 5.3-5.5 x 5.5 cm.  . Renal insufficiency 05/16/2011  . Aortic valve prosthesis present     a. SJM - [facility] 2003, chronic coumadin.    Past Surgical History  Procedure Laterality Date  . Impingment      right shoulder  . Abdominal aortic aneurysm repair    . Aortic valve replacement    . Knee arthroscopy      left  . Wisdom tooth extraction      There were no vitals filed for this visit.  Visit Diagnosis:  Right shoulder pain  Muscle weakness of right upper extremity  Decreased ROM of right shoulder  Abnormal posture      Subjective Assessment - 11/23/14 0934    Subjective He reports that he still has been doing better and only notices the pain intermittently when liftting his arm above his head. "I've noticed that I haven't noticed as much pain."   Currently in Pain? Yes   Pain Score 0-No pain   Pain Location Shoulder   Pain Orientation Right   Pain Onset More than a month ago   Pain Frequency  Intermittent   Aggravating Factors  reaching over head   Pain Relieving Factors moving arm out of position.    Effect of Pain on Daily Activities limited UE above head mobility   Multiple Pain Sites Yes   Pain Score 0   Pain Location Knee   Pain Orientation Left            [facility] PT Assessment - 11/23/14 0001    AROM   Right Shoulder ABduction --  full AROM with pain noted only 68 degrees.                     [facility] Adult PT Treatment/Exercise - 11/23/14 0937    Shoulder Exercises: Supine   Protraction AROM;Strengthening;Right;15 reps;Weights   Protraction Weight (lbs) 3#   Other Supine Exercises straight arm pertubations 2 x 10 pushes   Shoulder Exercises: Prone   Other Prone Exercises I's, T's, Y's x 15 ea  2#   Shoulder Exercises: Standing   External Rotation Strengthening;Right;20 reps;Theraband   Theraband Level (Shoulder External Rotation) Level 3 (Green)   Internal Rotation Strengthening;Right;20 reps;Theraband   Theraband Level (Shoulder Internal Rotation) Level 3 (Green)   Flexion  AROM;Strengthening;Right  scaption angle   Theraband Level (Shoulder Flexion) Level 3 (Green)   Extension Strengthening;Both;15 reps;Theraband   Theraband Level (Shoulder Extension) Level 4 (Blue)   Row Strengthening;Both;20 reps;Theraband   Theraband Level (Shoulder Row) Level 4 (Blue)   Other Standing Exercises scaption 2 x 10 with green    Shoulder Exercises: ROM/Strengthening   UBE (Upper Arm Bike) L2 x 6 min  changing direction every 2 min   Shoulder Exercises: Body Blade   Flexion 15 seconds;2 reps   External Rotation 15 seconds;2 reps   Internal Rotation 15 seconds;2 reps   Manual Therapy   Manual Therapy Joint mobilization   Joint Mobilization Scapular mobilization in all planes grade 2-3, resisted DNF patterns D1-D2                PT Education - 11/23/14 1036    Education provided Yes   Education Details added to HEP   Person(s) Educated Patient    Methods Explanation   Comprehension Verbalized understanding          PT Short Term Goals - 11/23/14 1041    PT SHORT TERM GOAL #1   Title pt will be I with Basic HEP (11/26/2014)   Time 3   Period Weeks   Status Achieved           PT Long Term Goals - 11/23/14 1041    PT LONG TERM GOAL #1   Title pt will be I with advanced HEP (12/17/2014)   Time 6   Period Weeks   Status On-going   PT LONG TERM GOAL #2   Title pt will increase R shoulder abduction to >160 degrees actively to assist with overhead activities (12/17/2014)    Time 6   Period Weeks   Status Achieved   PT LONG TERM GOAL #3   Title pt will decrease pain to <2/10 during and following overhead lifting activitites to assist with ADLs (12/17/2014)   Time 6   Period Weeks   Status Achieved   PT LONG TERM GOAL #4   Title Pt will increase general  R shoulder strenght to >4+/5 to help with lifting and carrying activities (12/17/2014)   Time 6   Period Weeks   Status On-going   PT LONG TERM GOAL #5   Title pt will be able to verbalize and demonstrate techniques to reduce risk of R shoulder reinjury by postural awareness, lifting and carrying mechanics, and HEP (12/17/2014)   Time 6   Period Weeks   Status On-going               Plan - 11/23/14 1036    Clinical Impression Statement Austin [name] continues to make great progress with decrease shoulder pain during abduction AROM with pain noted only at 68 degrees but is fleeting with full AROM. Added shoulder ER with shoulder retraction , and bil shoulder protraction with band.  Plan to progress with Strengthening as tolerated. He met STG 1 today.    PT Next Visit Plan Scapular stabilization exercise, rotator cuff strengthening, modalites for pain PRN, Diagonals.    PT Home Exercise Plan houlder ER with shoulder retraction , and bil shoulder protraction with band        Problem List Patient Active Problem List   Diagnosis Date Noted  . Plica syndrome of left  knee 10/02/2014  . Right shoulder pain 09/23/2014  . Left knee pain 09/23/2014  . Rash and nonspecific skin eruption 09/23/2014  . Encounter for therapeutic drug  monitoring 09/24/2013  . Aortic valve prosthesis present   . Thoracic aortic aneurysm   . Left-sided sensorineural hearing loss 12/27/2012  . Embolic stroke 20/09/7942  . Chronic anticoagulation 09/16/2012  . CKD (chronic kidney disease) 05/16/2011  . Preventative health care 05/14/2011  . CAROTID BRUIT 10/14/2010  . S/P aortic valve replacement 09/20/2010  . LUMBAR RADICULOPATHY, RIGHT 08/12/2009  . ILIAC ARTERY ANEURYSM 04/02/2009  . Unspecified Iron Deficiency Anemia 10/29/2007  . EROSIVE GASTRITIS 05/03/2007  . HYPOTHYROIDISM, SECONDARY 03/05/2007  . HYPERLIPIDEMIA 03/05/2007  . OBESITY 03/05/2007  . HYPERTENSION 03/05/2007  . CORONARY ARTERY DISEASE 03/05/2007  . PERICARDIAL EFFUSION 03/05/2007  . Internal hemorrhoids without mention of complication 46/19/0122  . LOW BACK PAIN 03/05/2007  . Tendinopathy of rotator cuff 03/05/2007   Starr Lake PT, DPT, LAT, ATC  11/23/2014  10:46 AM      [facility] [address] [place], [place], [address] Phone: [phone]   Fax:  [phone]

## 2014-11-23 NOTE — Patient Instructions (Signed)
   Omer Monter PT, DPT, LAT, ATC  [facility] Phone: [phone]     

## 2014-11-25 ENCOUNTER — Ambulatory Visit: Payer: BLUE CROSS/BLUE SHIELD | Admitting: Physical Therapy

## 2014-11-25 DIAGNOSIS — M25611 Stiffness of right shoulder, not elsewhere classified: Secondary | ICD-10-CM

## 2014-11-25 DIAGNOSIS — M25511 Pain in right shoulder: Secondary | ICD-10-CM

## 2014-11-25 DIAGNOSIS — M6281 Muscle weakness (generalized): Secondary | ICD-10-CM

## 2014-11-25 DIAGNOSIS — R293 Abnormal posture: Secondary | ICD-10-CM

## 2014-11-25 NOTE — Therapy (Signed)
[place], [place], [address] Phone: [phone]   Fax:  [phone]  Physical Therapy Treatment  Patient Details  Name: Austin [name] MRN: [id number] Date of Birth: June 24, 1956 Referring Provider:  [name], MD  Encounter Date: 11/25/2014      PT End of Session - 11/25/14 1128    Visit Number 5   Number of Visits 12   Date for PT Re-Evaluation 12/17/14   PT Start Time 0930   PT Stop Time 1015   PT Time Calculation (min) 45 min   Activity Tolerance Patient tolerated treatment well   Behavior During Therapy [facility] for tasks assessed/performed      Past Medical History  Diagnosis Date  . Coronary artery disease   . Hypertension   . Hyperlipidemia   . Obesity   . Internal hemorrhoid   . Pericardial effusion   . Thyroid disease   . Back pain   . Anemia     iron def  . Thoracic aortic aneurysm     a. s/p dissection and repair @ [facility] 2003 with SJM AVR;  CTA 09/2010 desc thor Ao 5.2 x 5.3-5.5 x 5.5 cm.  . Renal insufficiency 05/16/2011  . Aortic valve prosthesis present     a. SJM - [facility] 2003, chronic coumadin.    Past Surgical History  Procedure Laterality Date  . Impingment      right shoulder  . Abdominal aortic aneurysm repair    . Aortic valve replacement    . Knee arthroscopy      left  . Wisdom tooth extraction      There were no vitals filed for this visit.  Visit Diagnosis:  Right shoulder pain  Muscle weakness of right upper extremity  Decreased ROM of right shoulder  Abnormal posture      Subjective Assessment - 11/25/14 0939    Subjective pt reports that he is doing well today, and that he can tell that therapy is helping.    Currently in Pain? Yes   Pain Score 0-No pain   Pain Location Shoulder   Pain Orientation Right                         [facility] Adult PT Treatment/Exercise - 11/25/14 0941    Shoulder Exercises: Standing   External  Rotation AROM;Strengthening   Theraband Level (Shoulder External Rotation) Level 4 (Blue)   Internal Rotation Strengthening;Right;20 reps;Theraband   Theraband Level (Shoulder Internal Rotation) Level 4 (Blue)   Other Standing Exercises scaption 2 x 12 with green    Shoulder Exercises: ROM/Strengthening   UBE (Upper Arm Bike) L2 x 8 min  alt dir every 2 min   Wall Pushups 10 reps  2 sets with a plus   Other ROM/Strengthening Exercises wall cirlces 2 x 1 with small circles 30 sec, and big circles 30 sec  1 set Clock wise, 1 set counter clock wise   Shoulder Exercises: Stretch   Corner Stretch 2 reps;20 seconds   Cross Chest Stretch 2 reps;30 seconds   Other Shoulder Stretches standing sleeper stretch   Shoulder Exercises: Power Development worker, community --  1 x 8 reps 6#   Row 15 reps  6# bil   Other Power Tower Exercises diagonals D1 and D2 with 3# x 10 ea.    Shoulder Exercises: Body Blade   Flexion 15 seconds;2 reps   External Rotation Other (comment)  2 x 18 sec   Internal Rotation Other (comment)  2 x 18 sec                  PT Short Term Goals - 11/23/14 1041    PT SHORT TERM GOAL #1   Title pt will be I with Basic HEP (11/26/2014)   Time 3   Period Weeks   Status Achieved           PT Long Term Goals - 11/23/14 1041    PT LONG TERM GOAL #1   Title pt will be I with advanced HEP (12/17/2014)   Time 6   Period Weeks   Status On-going   PT LONG TERM GOAL #2   Title pt will increase R shoulder abduction to >160 degrees actively to assist with overhead activities (12/17/2014)    Time 6   Period Weeks   Status Achieved   PT LONG TERM GOAL #3   Title pt will decrease pain to <2/10 during and following overhead lifting activitites to assist with ADLs (12/17/2014)   Time 6   Period Weeks   Status Achieved   PT LONG TERM GOAL #4   Title Pt will increase general  R shoulder strenght to >4+/5 to help with lifting and carrying activities (12/17/2014)   Time 6    Period Weeks   Status On-going   PT LONG TERM GOAL #5   Title pt will be able to verbalize and demonstrate techniques to reduce risk of R shoulder reinjury by postural awareness, lifting and carrying mechanics, and HEP (12/17/2014)   Time 6   Period Weeks   Status On-going               Plan - 11/25/14 1128    Clinical Impression Statement pt reports that he can tell that he is getting and hasn't had as much pain since the last visit. He was able to tolerate exercises well today with no increase of symptoms or complaint of pain. Plan to progress with strengthening as tolerated.    PT Next Visit Plan Scapular stabilization exercise, rotator cuff strengthening, modalites for pain PRN, Diagonals.    Consulted and Agree with Plan of Care Patient        Problem List Patient Active Problem List   Diagnosis Date Noted  . Plica syndrome of left knee 10/02/2014  . Right shoulder pain 09/23/2014  . Left knee pain 09/23/2014  . Rash and nonspecific skin eruption 09/23/2014  . Encounter for therapeutic drug monitoring 09/24/2013  . Aortic valve prosthesis present   . Thoracic aortic aneurysm   . Left-sided sensorineural hearing loss 12/27/2012  . Embolic stroke 57/84/6962  . Chronic anticoagulation 09/16/2012  . CKD (chronic kidney disease) 05/16/2011  . Preventative health care 05/14/2011  . CAROTID BRUIT 10/14/2010  . S/P aortic valve replacement 09/20/2010  . LUMBAR RADICULOPATHY, RIGHT 08/12/2009  . ILIAC ARTERY ANEURYSM 04/02/2009  . Unspecified Iron Deficiency Anemia 10/29/2007  . EROSIVE GASTRITIS 05/03/2007  . HYPOTHYROIDISM, SECONDARY 03/05/2007  . HYPERLIPIDEMIA 03/05/2007  . OBESITY 03/05/2007  . HYPERTENSION 03/05/2007  . CORONARY ARTERY DISEASE 03/05/2007  . PERICARDIAL EFFUSION 03/05/2007  . Internal hemorrhoids without mention of complication 95/28/4132  . LOW BACK PAIN 03/05/2007  . Tendinopathy of rotator cuff 03/05/2007   Starr Lake PT, DPT, LAT,  ATC  11/25/2014  11:33 AM   [facility] [address] [place], [place], [address] Phone: [phone]   Fax:  [phone]      

## 2014-11-30 ENCOUNTER — Ambulatory Visit: Payer: BLUE CROSS/BLUE SHIELD | Attending: Family Medicine | Admitting: Physical Therapy

## 2014-11-30 DIAGNOSIS — R293 Abnormal posture: Secondary | ICD-10-CM | POA: Diagnosis not present

## 2014-11-30 DIAGNOSIS — M6281 Muscle weakness (generalized): Secondary | ICD-10-CM | POA: Insufficient documentation

## 2014-11-30 DIAGNOSIS — M25511 Pain in right shoulder: Secondary | ICD-10-CM | POA: Insufficient documentation

## 2014-11-30 DIAGNOSIS — M25611 Stiffness of right shoulder, not elsewhere classified: Secondary | ICD-10-CM

## 2014-11-30 NOTE — Therapy (Signed)
[place], [place], [address] Phone: [phone]   Fax:  [phone]  Physical Therapy Treatment  Patient Details  Name: Austin [name] MRN: [id number] Date of Birth: September 07, 1955 Referring Provider:  [name], MD  Encounter Date: 11/30/2014      PT End of Session - 11/30/14 1009    Visit Number 12   Date for PT Re-Evaluation 12/17/14   PT Start Time 0934   PT Stop Time 1013   PT Time Calculation (min) 39 min   Activity Tolerance Patient tolerated treatment well      Past Medical History  Diagnosis Date  . Coronary artery disease   . Hypertension   . Hyperlipidemia   . Obesity   . Internal hemorrhoid   . Pericardial effusion   . Thyroid disease   . Back pain   . Anemia     iron def  . Thoracic aortic aneurysm     a. s/p dissection and repair @ [facility] 2003 with SJM AVR;  CTA 09/2010 desc thor Ao 5.2 x 5.3-5.5 x 5.5 cm.  . Renal insufficiency 05/16/2011  . Aortic valve prosthesis present     a. SJM - [facility] 2003, chronic coumadin.    Past Surgical History  Procedure Laterality Date  . Impingment      right shoulder  . Abdominal aortic aneurysm repair    . Aortic valve replacement    . Knee arthroscopy      left  . Wisdom tooth extraction      There were no vitals filed for this visit.  Visit Diagnosis:  Right shoulder pain  Muscle weakness of right upper extremity  Abnormal posture  Decreased ROM of right shoulder      Subjective Assessment - 11/30/14 0933    Subjective no pain doing his home exercises. pain only with certain movements.  change of position helps pain, intermittant brief   Currently in Pain? No/denies   Aggravating Factors  reaching a certain way   Pain Relieving Factors change of position   Effect of Pain on Daily Activities has to move to avoid pain                         [facility] Adult PT Treatment/Exercise - 11/30/14 0950    Shoulder  Exercises: Supine   Theraband Level (Shoulder Flexion) Level 4 (Blue)  10 reps narrow hand pulls , also wide hand pulls 10 reps   Shoulder Exercises: Prone   Other Prone Exercises Hughston exercises 6 different exercises.    Shoulder Exercises: Standing   Flexion 10 reps  back to wall   ABduction 10 reps  back to wall bott    Other Standing Exercises ball on wall over head presses, cilockwise and counter clockwise, up/down, side to side 30    Shoulder Exercises: ROM/Strengthening   Other ROM/Strengthening Exercises cable cross D1 for flexion and extension flex 7 LBS extension 3 LBS                  PT Short Term Goals - 11/23/14 1041    PT SHORT TERM GOAL #1   Title pt will be I with Basic HEP (11/26/2014)   Time 3   Period Weeks   Status Achieved           PT Long Term Goals - 11/30/14 1015    PT LONG TERM GOAL #1   Title pt will  be I with advanced HEP (12/17/2014)   Time 6   Period Weeks   Status On-going   PT LONG TERM GOAL #2   Title pt will increase R shoulder abduction to >160 degrees actively to assist with overhead activities (12/17/2014)    Time 6   Period Weeks   Status Achieved   PT LONG TERM GOAL #3   Time 6   Period Weeks   Status Achieved   PT LONG TERM GOAL #4   Title Pt will increase general  R shoulder strenght to >4+/5 to help with lifting and carrying activities (12/17/2014)   Time 6   Period Weeks   Status On-going   PT LONG TERM GOAL #5   Title pt will be able to verbalize and demonstrate techniques to reduce risk of R shoulder reinjury by postural awareness, lifting and carrying mechanics, and HEP (12/17/2014)   Time 6   Period Weeks   Status On-going               Plan - 11/30/14 1012    Clinical Impression Statement Intermittant pain during session, brief.  No pain at end of session.  Patient declined ice.  Endurance limited for overhead activities.   PT Next Visit Plan Scapular stabilization exercise, rotator cuff  strengthening, modalites for pain PRN, Diagonals.         Problem List Patient Active Problem List   Diagnosis Date Noted  . Plica syndrome of left knee 10/02/2014  . Right shoulder pain 09/23/2014  . Left knee pain 09/23/2014  . Rash and nonspecific skin eruption 09/23/2014  . Encounter for therapeutic drug monitoring 09/24/2013  . Aortic valve prosthesis present   . Thoracic aortic aneurysm   . Left-sided sensorineural hearing loss 12/27/2012  . Embolic stroke 91/69/4503  . Chronic anticoagulation 09/16/2012  . CKD (chronic kidney disease) 05/16/2011  . Preventative health care 05/14/2011  . CAROTID BRUIT 10/14/2010  . S/P aortic valve replacement 09/20/2010  . LUMBAR RADICULOPATHY, RIGHT 08/12/2009  . ILIAC ARTERY ANEURYSM 04/02/2009  . Unspecified Iron Deficiency Anemia 10/29/2007  . EROSIVE GASTRITIS 05/03/2007  . HYPOTHYROIDISM, SECONDARY 03/05/2007  . HYPERLIPIDEMIA 03/05/2007  . OBESITY 03/05/2007  . HYPERTENSION 03/05/2007  . CORONARY ARTERY DISEASE 03/05/2007  . PERICARDIAL EFFUSION 03/05/2007  . Internal hemorrhoids without mention of complication 88/82/8003  . LOW BACK PAIN 03/05/2007  . Tendinopathy of rotator cuff 03/05/2007    Eshal Propps 11/30/2014, 10:18 AM  [facility] [address] [place], [place], [address] Phone: [phone]   Fax:  [phone]  Melvenia Needles, PTA 11/30/2014 10:18 AM Phone: [phone] Fax: [phone]

## 2014-12-01 ENCOUNTER — Ambulatory Visit (INDEPENDENT_AMBULATORY_CARE_PROVIDER_SITE_OTHER): Payer: BLUE CROSS/BLUE SHIELD | Admitting: General Practice

## 2014-12-01 DIAGNOSIS — Z5181 Encounter for therapeutic drug level monitoring: Secondary | ICD-10-CM | POA: Diagnosis not present

## 2014-12-01 DIAGNOSIS — Z952 Presence of prosthetic heart valve: Secondary | ICD-10-CM

## 2014-12-01 DIAGNOSIS — Z954 Presence of other heart-valve replacement: Secondary | ICD-10-CM | POA: Diagnosis not present

## 2014-12-01 LAB — POCT INR: INR: 2.3

## 2014-12-01 NOTE — Progress Notes (Signed)
Pre visit review using our clinic review tool, if applicable. No additional management support is needed unless otherwise documented below in the visit note. 

## 2014-12-01 NOTE — Progress Notes (Signed)
Agree with plan 

## 2014-12-02 ENCOUNTER — Ambulatory Visit: Payer: BLUE CROSS/BLUE SHIELD | Admitting: Physical Therapy

## 2014-12-02 DIAGNOSIS — M6281 Muscle weakness (generalized): Secondary | ICD-10-CM

## 2014-12-02 DIAGNOSIS — R293 Abnormal posture: Secondary | ICD-10-CM

## 2014-12-02 DIAGNOSIS — M25611 Stiffness of right shoulder, not elsewhere classified: Secondary | ICD-10-CM

## 2014-12-02 DIAGNOSIS — M25511 Pain in right shoulder: Secondary | ICD-10-CM | POA: Diagnosis not present

## 2014-12-02 NOTE — Therapy (Signed)
[place], [place], [address] Phone: [phone]   Fax:  [phone]  Physical Therapy Treatment  Patient Details  Name: Austin [name] MRN: [id number] Date of Birth: 07/29/1956 Referring Provider:  [name], MD  Encounter Date: 12/02/2014      PT End of Session - 12/02/14 1225    Visit Number 7   Number of Visits 12   Date for PT Re-Evaluation 12/17/14   PT Start Time 0930   PT Stop Time 1015   PT Time Calculation (min) 45 min   Activity Tolerance Patient tolerated treatment well   Behavior During Therapy [facility] for tasks assessed/performed      Past Medical History  Diagnosis Date  . Coronary artery disease   . Hypertension   . Hyperlipidemia   . Obesity   . Internal hemorrhoid   . Pericardial effusion   . Thyroid disease   . Back pain   . Anemia     iron def  . Thoracic aortic aneurysm     a. s/p dissection and repair @ [facility] 2003 with SJM AVR;  CTA 09/2010 desc thor Ao 5.2 x 5.3-5.5 x 5.5 cm.  . Renal insufficiency 05/16/2011  . Aortic valve prosthesis present     a. SJM - [facility] 2003, chronic coumadin.    Past Surgical History  Procedure Laterality Date  . Impingment      right shoulder  . Abdominal aortic aneurysm repair    . Aortic valve replacement    . Knee arthroscopy      left  . Wisdom tooth extraction      There were no vitals filed for this visit.  Visit Diagnosis:  Right shoulder pain  Muscle weakness of right upper extremity  Abnormal posture  Decreased ROM of right shoulder      Subjective Assessment - 12/02/14 0932    Subjective pt reports that he is doing well and continues to have no pain today. "the pain is still there, but I haven't been avoiding those motions like i did previously".    Currently in Pain? Yes   Pain Score 0-No pain   Pain Location Shoulder   Pain Orientation Right                         [facility] Adult PT  Treatment/Exercise - 12/02/14 0934    Shoulder Exercises: Standing   External Rotation AROM;Strengthening;Right;10 reps;Theraband  with unilateral row   Theraband Level (Shoulder External Rotation) Level 2 (Red)   Internal Rotation AROM;Strengthening;Right;10 reps;Theraband  with shoulder protaction    Theraband Level (Shoulder Internal Rotation) Level 2 (Red)   Flexion AROM;Strengthening;Right;15 reps  cueing to only go to 90 degrees.   ABduction AROM;Strengthening;Right;10 reps   Other Standing Exercises ball on wall over head presses, cilockwise and counter clockwise, up/down, side to side 30    Other Standing Exercises bicep curls x 15  9#   Shoulder Exercises: ROM/Strengthening   UBE (Upper Arm Bike) L2 x 8 min   Wall Pushups 20 reps  with a plus   Other ROM/Strengthening Exercises cable cross D1 for flexion and extension flex 7 LBS extension 3 LBS   Shoulder Exercises: Stretch   Corner Stretch 2 reps;30 seconds   Other Shoulder Stretches standing sleeper stretch 2 x 30   Other Shoulder Stretches bicep stretch 2 x 30 sec  pronation, elbow ext, and shouilder ext   Shoulder Exercises: Power  Tower   Extension 10 reps  2 sets @ 1 x 10#, 1 x 7 #   Row 10 reps  2 sets 10#   Other Power Tower Exercises diagonals D1 and D2 1 set ea.  D1 with 7# x 15, D2 with 3# x 13   Shoulder Exercises: Body Blade   Flexion --  20 sec x 1   ABduction --  15 sec x 1 in scaption angle   Other Body Blade Exercises internal/external rotation 3 x 15 sec  with towel underelbow to create scaption angle in nuetral                PT Education - 12/02/14 1224    Education provided Yes   Education Details to progress wall push-ups to lowered surface   Person(s) Educated Patient   Methods Explanation   Comprehension Verbalized understanding          PT Short Term Goals - 11/23/14 1041    PT SHORT TERM GOAL #1   Title pt will be I with Basic HEP (11/26/2014)   Time 3   Period Weeks    Status Achieved           PT Long Term Goals - 11/30/14 1015    PT LONG TERM GOAL #1   Title pt will be I with advanced HEP (12/17/2014)   Time 6   Period Weeks   Status On-going   PT LONG TERM GOAL #2   Title pt will increase R shoulder abduction to >160 degrees actively to assist with overhead activities (12/17/2014)    Time 6   Period Weeks   Status Achieved   PT LONG TERM GOAL #3   Time 6   Period Weeks   Status Achieved   PT LONG TERM GOAL #4   Title Pt will increase general  R shoulder strenght to >4+/5 to help with lifting and carrying activities (12/17/2014)   Time 6   Period Weeks   Status On-going   PT LONG TERM GOAL #5   Title pt will be able to verbalize and demonstrate techniques to reduce risk of R shoulder reinjury by postural awareness, lifting and carrying mechanics, and HEP (12/17/2014)   Time 6   Period Weeks   Status On-going               Plan - 12/02/14 1225    Clinical Impression Statement [name] continues to make great progess with UE strength and AROM. No new goals met this visit. He continues to feel some pain intermittently around 70-90 degrees of abduction. Added bicep curls today which he reported no discomfort, and progressed shoulder IR and ER to 90/90 with unliateal row with ER, and unilateral protraction with IR. He tolerated treatment  well today, plan to progress overhead strengthening    PT Next Visit Plan Scapular stabilization exercise, rotator cuff strengthening, modalites for pain PRN, Diagonals, overhead strengthening.    PT Home Exercise Plan progressing wall push-up to lowered surface.    Consulted and Agree with Plan of Care Patient        Problem List Patient Active Problem List   Diagnosis Date Noted  . Plica syndrome of left knee 10/02/2014  . Right shoulder pain 09/23/2014  . Left knee pain 09/23/2014  . Rash and nonspecific skin eruption 09/23/2014  . Encounter for therapeutic drug monitoring 09/24/2013  . Aortic  valve prosthesis present   . Thoracic aortic aneurysm   . Left-sided sensorineural hearing  loss 12/27/2012  . Embolic stroke 03/55/9741  . Chronic anticoagulation 09/16/2012  . CKD (chronic kidney disease) 05/16/2011  . Preventative health care 05/14/2011  . CAROTID BRUIT 10/14/2010  . S/P aortic valve replacement 09/20/2010  . LUMBAR RADICULOPATHY, RIGHT 08/12/2009  . ILIAC ARTERY ANEURYSM 04/02/2009  . Unspecified Iron Deficiency Anemia 10/29/2007  . EROSIVE GASTRITIS 05/03/2007  . HYPOTHYROIDISM, SECONDARY 03/05/2007  . HYPERLIPIDEMIA 03/05/2007  . OBESITY 03/05/2007  . HYPERTENSION 03/05/2007  . CORONARY ARTERY DISEASE 03/05/2007  . PERICARDIAL EFFUSION 03/05/2007  . Internal hemorrhoids without mention of complication 63/84/5364  . LOW BACK PAIN 03/05/2007  . Tendinopathy of rotator cuff 03/05/2007   Starr Lake PT, DPT, LAT, ATC  12/02/2014  12:31 PM    [facility] [address] [place], [place], [address] Phone: [phone]   Fax:  [phone]

## 2014-12-07 ENCOUNTER — Ambulatory Visit: Payer: BLUE CROSS/BLUE SHIELD | Admitting: Physical Therapy

## 2014-12-07 DIAGNOSIS — M25611 Stiffness of right shoulder, not elsewhere classified: Secondary | ICD-10-CM

## 2014-12-07 DIAGNOSIS — M25511 Pain in right shoulder: Secondary | ICD-10-CM | POA: Diagnosis not present

## 2014-12-07 DIAGNOSIS — R293 Abnormal posture: Secondary | ICD-10-CM

## 2014-12-07 DIAGNOSIS — M6281 Muscle weakness (generalized): Secondary | ICD-10-CM

## 2014-12-07 NOTE — Therapy (Signed)
[name], [place], [address] Phone: [phone]   Fax:  [phone]  Physical Therapy Treatment  Patient Details  Name: Austin [name] MRN: [id number] Date of Birth: [birth year] Referring Provider:  [name], MD  Encounter Date: 12/07/2014      PT End of Session - 12/07/14 1227    PT Start Time 0930   PT Stop Time 1015   PT Time Calculation (min) 45 min   Activity Tolerance Patient tolerated treatment well   Behavior During Therapy [facility] for tasks assessed/performed      Past Medical History  Diagnosis Date  . Coronary artery disease   . Hypertension   . Hyperlipidemia   . Obesity   . Internal hemorrhoid   . Pericardial effusion   . Thyroid disease   . Back pain   . Anemia     iron def  . Thoracic aortic aneurysm     a. s/p dissection and repair @ [facility] 2003 with SJM AVR;  CTA 09/2010 desc thor Ao 5.2 x 5.3-5.5 x 5.5 cm.  . Renal insufficiency 05/16/2011  . Aortic valve prosthesis present     a. SJM - [facility] 2003, chronic coumadin.    Past Surgical History  Procedure Laterality Date  . Impingment      right shoulder  . Abdominal aortic aneurysm repair    . Aortic valve replacement    . Knee arthroscopy      left  . Wisdom tooth extraction      There were no vitals filed for this visit.  Visit Diagnosis:  Right shoulder pain  Muscle weakness of right upper extremity  Abnormal posture  Decreased ROM of right shoulder      Subjective Assessment - 12/07/14 0938    Subjective pt states that he rarely feels any problems in his shoulder. "i've noticed that I don't have the pain much any more, I'll feel it once then do the same movement again and its gone".    Currently in Pain? Yes   Pain Score 0-No pain   Pain Location Shoulder   Pain Orientation Right                         [facility] Adult PT Treatment/Exercise - 12/07/14 0939    Shoulder Exercises: Prone   Other Prone Exercises Hughston exercises 6 different exercises.    Other Prone Exercises I's, T's, Y's   Shoulder Exercises: ROM/Strengthening   UBE (Upper Arm Bike) L2 x 8 min  alt directions every 2 min   Shoulder Exercises: Stretch   Corner Stretch 2 reps;30 seconds   Cross Chest Stretch 2 reps;30 seconds   Other Shoulder Stretches standing sleeper stretch 2 x 30   Other Shoulder Stretches bicep stretch 2 x 30 sec   Shoulder Exercises: Power Heritage manager 15 reps  13#    Other Power [employer] Exercises diagonals D1 and D2 1 set ea.  d2 x 10 3#, D1 x10 7#   Shoulder Exercises: Body Blade   Flexion 15 seconds;2 reps   ABduction 15 seconds;3 reps   Other Body Blade Exercises internal/external rotation 3 x 20 sec  2 x 50%, 1 x 100%                PT Education - 12/07/14 1226    Education provided Yes   Education Details added exercises to HEP, discussed  progress with pt   Person(s) Educated Patient   Methods Explanation   Comprehension Verbalized understanding          PT Short Term Goals - 11/23/14 1041    PT SHORT TERM GOAL #1   Title pt will be I with Basic HEP (11/26/2014)   Time 3   Period Weeks   Status Achieved           PT Long Term Goals - 12/07/14 1231    PT LONG TERM GOAL #1   Title pt will be I with advanced HEP (12/17/2014)   Time 6   Period Weeks   Status On-going   PT LONG TERM GOAL #2   Title pt will increase R shoulder abduction to >160 degrees actively to assist with overhead activities (12/17/2014)    Time 6   Period Weeks   Status Achieved   PT LONG TERM GOAL #3   Title pt will decrease pain to <2/10 during and following overhead lifting activitites to assist with ADLs (12/17/2014)   Time 6   Period Weeks   Status Achieved   PT LONG TERM GOAL #4   Title Pt will increase general  R shoulder strenght to >4+/5 to help with lifting and carrying activities (12/17/2014)   Time 6   Period Weeks   Status On-going   PT  LONG TERM GOAL #5   Title pt will be able to verbalize and demonstrate techniques to reduce risk of R shoulder reinjury by postural awareness, lifting and carrying mechanics, and HEP (12/17/2014)   Time 6   Period Weeks   Status On-going               Plan - 12/07/14 1227    Clinical Impression Statement Austin [name] presents to therapy today without pain and report of decrease pain throughout the day during UE use. No new goals me this visit. He tolerated exercises well today with no compliant of pain and no complaint of fatigue. Discussed with pt how he has progressed, and dropping to 1 x a week for the remainder of his approved visits to assist with transition to independent exercise.    PT Next Visit Plan Scapular stabilization exercise, rotator cuff strengthening, modalites for pain PRN, Diagonals, overhead strengthening, Assess Strength.    PT Home Exercise Plan bicep stretch, and doorway stretch   Consulted and Agree with Plan of Care Patient        Problem List Patient Active Problem List   Diagnosis Date Noted  . Plica syndrome of left knee 10/02/2014  . Right shoulder pain 09/23/2014  . Left knee pain 09/23/2014  . Rash and nonspecific skin eruption 09/23/2014  . Encounter for therapeutic drug monitoring 09/24/2013  . Aortic valve prosthesis present   . Thoracic aortic aneurysm   . Left-sided sensorineural hearing loss 12/27/2012  . Embolic stroke 96/78/9381  . Chronic anticoagulation 09/16/2012  . CKD (chronic kidney disease) 05/16/2011  . Preventative health care 05/14/2011  . CAROTID BRUIT 10/14/2010  . S/P aortic valve replacement 09/20/2010  . LUMBAR RADICULOPATHY, RIGHT 08/12/2009  . ILIAC ARTERY ANEURYSM 04/02/2009  . Unspecified Iron Deficiency Anemia 10/29/2007  . EROSIVE GASTRITIS 05/03/2007  . HYPOTHYROIDISM, SECONDARY 03/05/2007  . HYPERLIPIDEMIA 03/05/2007  . OBESITY 03/05/2007  . HYPERTENSION 03/05/2007  . CORONARY ARTERY DISEASE 03/05/2007  .  PERICARDIAL EFFUSION 03/05/2007  . Internal hemorrhoids without mention of complication 01/75/1025  . LOW BACK PAIN 03/05/2007  . Tendinopathy of rotator cuff 03/05/2007  Austin [name] PT, DPT, LAT, ATC  12/07/2014  12:33 PM    [facility] [address] [place], [place], [address] Phone: [phone]   Fax:  [phone]

## 2014-12-07 NOTE — Patient Instructions (Signed)
   Soloman Mckeithan PT, DPT, LAT, ATC   Outpatient Rehabilitation Phone: [phone]     

## 2014-12-09 ENCOUNTER — Encounter: Payer: BLUE CROSS/BLUE SHIELD | Admitting: Physical Therapy

## 2014-12-15 ENCOUNTER — Ambulatory Visit: Payer: BLUE CROSS/BLUE SHIELD | Admitting: Physical Therapy

## 2014-12-15 DIAGNOSIS — M25511 Pain in right shoulder: Secondary | ICD-10-CM

## 2014-12-15 DIAGNOSIS — M25611 Stiffness of right shoulder, not elsewhere classified: Secondary | ICD-10-CM

## 2014-12-15 DIAGNOSIS — R293 Abnormal posture: Secondary | ICD-10-CM

## 2014-12-15 DIAGNOSIS — M6281 Muscle weakness (generalized): Secondary | ICD-10-CM

## 2014-12-15 NOTE — Therapy (Addendum)
[place], [place], [address] Phone: [phone]   Fax:  [phone]  Physical Therapy Treatment / discharge note  Patient Details  Name: Austin [name] MRN: [id number] Date of Birth: March 21, 1956 Referring Provider:  [name], MD  Encounter Date: 12/15/2014      PT End of Session - 12/15/14 1246    Visit Number 10   Number of Visits 12   Date for PT Re-Evaluation 12/17/14   PT Start Time 0808   PT Stop Time 0850   PT Time Calculation (min) 42 min   Activity Tolerance Patient tolerated treatment well   Behavior During Therapy [name] for tasks assessed/performed      Past Medical History  Diagnosis Date  . Coronary artery disease   . Hypertension   . Hyperlipidemia   . Obesity   . Internal hemorrhoid   . Pericardial effusion   . Thyroid disease   . Back pain   . Anemia     iron def  . Thoracic aortic aneurysm     a. s/p dissection and repair @ [facility] 2003 with SJM AVR;  CTA 09/2010 desc thor Ao 5.2 x 5.3-5.5 x 5.5 cm.  . Renal insufficiency 05/16/2011  . Aortic valve prosthesis present     a. SJM - [facility] 2003, chronic coumadin.    Past Surgical History  Procedure Laterality Date  . Impingment      right shoulder  . Abdominal aortic aneurysm repair    . Aortic valve replacement    . Knee arthroscopy      left  . Wisdom tooth extraction      There were no vitals filed for this visit.  Visit Diagnosis:  Right shoulder pain  Muscle weakness of right upper extremity  Abnormal posture  Decreased ROM of right shoulder      Subjective Assessment - 12/15/14 0810    Subjective "I can still hit that painful spot every once in a while but have noticed I can't find it as often". He reports being compliant with the HEP  and challenging himself progressing reps and sets.   Currently in Pain? Yes   Pain Score 0-No pain   Pain Location Shoulder   Aggravating Factors  moving the arm around  in a weird way (having to look for the pain now)    Pain Relieving Factors change position (fleeting)            [facility] PT Assessment - 12/15/14 0001    Observation/Other Assessments   Focus on Therapeutic Outcomes (FOTO)  28% limited   AROM   Right Shoulder Extension 64 Degrees   Right Shoulder Flexion 180 Degrees  no pain    Right Shoulder ABduction 180 Degrees  no pain   Right Shoulder Internal Rotation 88 Degrees   Right Shoulder External Rotation 90 Degrees   Strength   Right Shoulder Flexion 5/5   Right Shoulder Extension 5/5   Right Shoulder ABduction 4+/5   Right Shoulder Internal Rotation 5/5   Right Shoulder External Rotation 5/5                     [facility] Adult PT Treatment/Exercise - 12/15/14 0812    Shoulder Exercises: Prone   Other Prone Exercises I's, T's, Y's x 15 ea.  with 2#   Shoulder Exercises: Standing   External Rotation AROM;Strengthening;Right;Theraband;15 reps  at 90/90 with single arm row then ER   Theraband Level (  Shoulder External Rotation) Level 4 (Blue)   Internal Rotation AROM;Strengthening;Right;Theraband;15 reps  at 90/90 IR, with forward serratus punch   Theraband Level (Shoulder Internal Rotation) Level 4 (Blue)   Other Standing Exercises bicep curls x 20  9#   Shoulder Exercises: ROM/Strengthening   UBE (Upper Arm Bike) L2 x 8 min  change dir every 2 min   Shoulder Exercises: Stretch   Corner Stretch 30 seconds;1 rep   Cross Chest Stretch 1 rep;30 seconds   Other Shoulder Stretches standing sleeper stretch 2 x 30   Other Shoulder Stretches bicep stretch 1 x 30 sec   Shoulder Exercises: [employer]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                 PT Education - 12/15/14 1246    Education  provided Yes   Education Details HEP review   Person(s) Educated Patient   Methods Explanation   Comprehension Verbalized understanding          PT Short Term Goals - 11/23/14 1041    PT SHORT TERM GOAL #1   Title pt will be I with Basic HEP (11/26/2014)   Time 3   Period Weeks   Status Achieved           PT Long Term Goals - 12/15/14 1249    PT LONG TERM GOAL #1   Title pt will be I with advanced HEP (12/17/2014)   Time 6   Period Weeks   Status Achieved   PT LONG TERM GOAL #2   Title pt will increase R shoulder abduction to >160 degrees actively to assist with overhead activities (12/17/2014)    Time 6   Period Weeks   Status Achieved   PT LONG TERM GOAL #3   Title pt will decrease pain to <2/10 during and following overhead lifting activitites to assist with ADLs (12/17/2014)   Time 6   Period Weeks   Status Achieved   PT LONG TERM GOAL #4   Title Pt will increase general  R shoulder strenght to >4+/5 to help with lifting and carrying activities (12/17/2014)   Time 6   Period Weeks   Status Achieved   PT LONG TERM GOAL #5   Title pt will be able to verbalize and demonstrate techniques to reduce risk of R shoulder reinjury by postural awareness, lifting and carrying mechanics, and HEP (12/17/2014)   Time 6   Period Weeks   Status Achieved               Plan - 12/15/14 6720    Clinical Impression Statement [name] has made great progress with decreased pain in the R shoulder, Full functional AROM and increased strength. He has met all STG/LTGs. He reports that he now has difficulty finding the painful areas and is pleased with the progress since he has been dealing with it so long.  He no longer requires skilled physical therapy and is able to maintain HEP indepedently    PT Next Visit Plan D/C this visit   PT Home Exercise Plan HEP review   Consulted and Agree with Plan of Care Patient  Problem List Patient Active Problem List   Diagnosis Date  Noted  . Plica syndrome of left knee 10/02/2014  . Right shoulder pain 09/23/2014  . Left knee pain 09/23/2014  . Rash and nonspecific skin eruption 09/23/2014  . Encounter for therapeutic drug monitoring 09/24/2013  . Aortic valve prosthesis present   . Thoracic aortic aneurysm   . Left-sided sensorineural hearing loss 12/27/2012  . Embolic stroke 42/90/3795  . Chronic anticoagulation 09/16/2012  . CKD (chronic kidney disease) 05/16/2011  . Preventative health care 05/14/2011  . CAROTID BRUIT 10/14/2010  . S/P aortic valve replacement 09/20/2010  . LUMBAR RADICULOPATHY, RIGHT 08/12/2009  . ILIAC ARTERY ANEURYSM 04/02/2009  . Unspecified Iron Deficiency Anemia 10/29/2007  . EROSIVE GASTRITIS 05/03/2007  . HYPOTHYROIDISM, SECONDARY 03/05/2007  . HYPERLIPIDEMIA 03/05/2007  . OBESITY 03/05/2007  . HYPERTENSION 03/05/2007  . CORONARY ARTERY DISEASE 03/05/2007  . PERICARDIAL EFFUSION 03/05/2007  . Internal hemorrhoids without mention of complication 58/31/6742  . LOW BACK PAIN 03/05/2007  . Tendinopathy of rotator cuff 03/05/2007   Starr Lake PT, DPT, LAT, ATC  12/15/2014  12:53 PM    [facility] [address] [place], [place], [address] Phone: [phone]   Fax:  [phone]     PHYSICAL THERAPY DISCHARGE SUMMARY  Visits from Start of Care: 10  Current functional level related to goals / functional outcomes: 28% limited   Remaining deficits: N/A   Education / Equipment: Advanced HEP, HEP review  Plan: Patient agrees to discharge.  Patient goals were met. Patient is being discharged due to meeting the stated rehab goals.  ?????   Kristoffer Leamon PT, DPT, LAT, ATC  12/15/2014  1:13 PM

## 2014-12-22 ENCOUNTER — Encounter: Payer: BLUE CROSS/BLUE SHIELD | Admitting: Physical Therapy

## 2014-12-24 ENCOUNTER — Encounter: Payer: BLUE CROSS/BLUE SHIELD | Admitting: Physical Therapy

## 2014-12-29 ENCOUNTER — Encounter: Payer: BLUE CROSS/BLUE SHIELD | Admitting: Physical Therapy

## 2015-01-01 ENCOUNTER — Encounter: Payer: BLUE CROSS/BLUE SHIELD | Admitting: Physical Therapy

## 2015-01-02 ENCOUNTER — Other Ambulatory Visit: Payer: Self-pay | Admitting: Internal Medicine

## 2015-01-05 ENCOUNTER — Encounter: Payer: BLUE CROSS/BLUE SHIELD | Admitting: Physical Therapy

## 2015-01-06 ENCOUNTER — Other Ambulatory Visit: Payer: Self-pay | Admitting: Internal Medicine

## 2015-01-07 ENCOUNTER — Encounter: Payer: BLUE CROSS/BLUE SHIELD | Admitting: Physical Therapy

## 2015-01-12 ENCOUNTER — Encounter: Payer: BLUE CROSS/BLUE SHIELD | Admitting: Physical Therapy

## 2015-01-13 ENCOUNTER — Ambulatory Visit (INDEPENDENT_AMBULATORY_CARE_PROVIDER_SITE_OTHER): Payer: BLUE CROSS/BLUE SHIELD | Admitting: *Deleted

## 2015-01-13 ENCOUNTER — Telehealth: Payer: Self-pay | Admitting: Oncology

## 2015-01-13 DIAGNOSIS — Z5181 Encounter for therapeutic drug level monitoring: Secondary | ICD-10-CM | POA: Diagnosis not present

## 2015-01-13 DIAGNOSIS — Z954 Presence of other heart-valve replacement: Secondary | ICD-10-CM

## 2015-01-13 DIAGNOSIS — Z952 Presence of prosthetic heart valve: Secondary | ICD-10-CM

## 2015-01-13 DIAGNOSIS — Z7901 Long term (current) use of anticoagulants: Secondary | ICD-10-CM

## 2015-01-13 LAB — POCT INR: INR: 2.7

## 2015-01-13 NOTE — Telephone Encounter (Signed)
new patient appt-s/w patient and gave np appt for 07/06 @ 10:30 w/Dr. Alen Blew Dx-Kaposi sarcoma  Referring -[facility]

## 2015-01-13 NOTE — Progress Notes (Signed)
Pre visit review using our clinic review tool, if applicable. No additional management support is needed unless otherwise documented below in the visit note. 

## 2015-01-13 NOTE — Progress Notes (Signed)
I have reviewed and agree with the plan. 

## 2015-01-14 ENCOUNTER — Encounter: Payer: BLUE CROSS/BLUE SHIELD | Admitting: Physical Therapy

## 2015-02-03 ENCOUNTER — Telehealth: Payer: Self-pay | Admitting: *Deleted

## 2015-02-03 ENCOUNTER — Ambulatory Visit (HOSPITAL_BASED_OUTPATIENT_CLINIC_OR_DEPARTMENT_OTHER): Payer: BLUE CROSS/BLUE SHIELD | Admitting: Oncology

## 2015-02-03 ENCOUNTER — Ambulatory Visit: Payer: BLUE CROSS/BLUE SHIELD

## 2015-02-03 ENCOUNTER — Other Ambulatory Visit: Payer: BLUE CROSS/BLUE SHIELD

## 2015-02-03 ENCOUNTER — Telehealth: Payer: Self-pay | Admitting: Oncology

## 2015-02-03 ENCOUNTER — Encounter: Payer: Self-pay | Admitting: Oncology

## 2015-02-03 VITALS — BP 144/77 | HR 76 | Temp 98.6°F | Resp 18 | Ht 68.0 in | Wt 223.0 lb

## 2015-02-03 DIAGNOSIS — I2510 Atherosclerotic heart disease of native coronary artery without angina pectoris: Secondary | ICD-10-CM

## 2015-02-03 DIAGNOSIS — C467 Kaposi's sarcoma of other sites: Secondary | ICD-10-CM | POA: Diagnosis not present

## 2015-02-03 DIAGNOSIS — C469 Kaposi's sarcoma, unspecified: Secondary | ICD-10-CM | POA: Insufficient documentation

## 2015-02-03 NOTE — Telephone Encounter (Signed)
Gave adn printed appt sched and avs for pt for JULy and AUg

## 2015-02-03 NOTE — Progress Notes (Signed)
Checked in new pt with no financial concerns. °

## 2015-02-03 NOTE — Consult Note (Signed)
Reason for Referral:  Kaposi's sarcoma.  HPI: 59 year old gentleman native of [place] currently lives in [place] for last 10 years. He is a pleasant gentleman with history of hypertension, hyperlipidemia also history of aortic valve replacement in 2003. He also had a thoracic aortic aneurysm repair at that time. 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 He have noticed intermittently a lesion on his lower extremities that have been intermittent for the last 2 years. Most recently that lesion on the anterior of his right leg continued to rise and increase in size. He was evaluated by dermatology last few months biopsy obtained 11/09/2014. 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 Patient subsequently was evaluated by Dr. Kendall Flack at [facility]. He underwent a CT scan of the chest, abdomen and pelvis which showed no evidence of systemic disease without any lymphadenopathy or lymphangitic spread. He had an echocardiogram done on 12/10/2014 which showed that his left ventricular systolic function was normal with an ejection fraction 55-60%. Based on these findings, he was recommended to proceed with Doxil chemotherapy and received her first dose on 01/08/2015. He received 30 mg/m for a total dose of 66 mg at that time. He tolerated it very well via peripheral veins but did have low-grade nausea that was resolved with Compazine. He was subsequently referred to [facility] for convenience of treatment. He did have HIV testing that was unremarkable. Clinically, he is asymptomatic at this point. He has not reported any new lesions or rashes. Has not reported any lower extremity edema. Did not have any delayed toxicity related to Doxil. Did not report any peripheral neuropathy or hand-foot syndrome.  He does not report any headaches, blurry vision, syncope or  seizures. He does not report any fevers, chills, sweats or weight loss. He does not report any chest pain, coaptation, orthopnea or leg edema. He does not report any cough, wheezing or shortness of breath. He does not report any nausea, vomiting, abdominal pain, hematochezia or. He does not report any frequency, urgency or hesitancy. He does not report any skeletal complaints. Remainder review of systems unremarkable.   Past Medical History  Diagnosis Date  . Coronary artery disease   . Hypertension   . Hyperlipidemia   . Obesity   . Internal hemorrhoid   . Pericardial effusion   . Thyroid disease   . Back pain   . Anemia     iron def  . Thoracic aortic aneurysm     a. s/p dissection and repair @ [facility] 2003 with SJM AVR;  CTA 09/2010 desc thor Ao 5.2 x 5.3-5.5 x 5.5 cm.  . Renal insufficiency 05/16/2011  . Aortic valve prosthesis present     a. SJM - [facility] 2003, chronic coumadin.  :  Past Surgical History  Procedure Laterality Date  . Impingment      right shoulder  . Abdominal aortic aneurysm repair    . Aortic valve replacement    . Knee arthroscopy      left  . Wisdom tooth extraction    :   Current outpatient prescriptions:  .  prochlorperazine (COMPAZINE) 10 MG tablet, Take 10 mg by mouth., Disp: , Rfl:  .  Diclofenac Sodium 2 % SOLN, Apply 1 pump twice daily., Disp: 112 g, Rfl: 3 .  fenofibrate 160 MG tablet, TAKE 1 TABLET DAILY, Disp: 90 tablet, Rfl: 3 .  ferrous sulfate 325 (65 FE) MG tablet, Take 325 mg by mouth., Disp: , Rfl:  .  LORazepam (ATIVAN) 1 MG tablet, Take 1 mg by mouth as needed., Disp: , Rfl: 0 .  losartan (COZAAR) 50 MG tablet, Take 1 tablet (50 mg total) by mouth daily., Disp: 90 tablet, Rfl: 3 .  losartan (COZAAR) 50 MG tablet, Take 1 tablet (50 mg total) by mouth daily., Disp: 90 tablet, Rfl: 3 .  metoprolol (TOPROL XL) 200 MG 24 hr tablet, Take 1 tablet (200 mg total) by mouth daily., Disp: 90 tablet, Rfl: 3 .  Multiple Vitamin (MULTIVITAMIN) tablet,  Take 1 tablet by mouth daily., Disp: , Rfl:  .  rosuvastatin (CRESTOR) 40 MG tablet, Take 1 tablet (40 mg total) by mouth daily., Disp: 90 tablet, Rfl: 3 .  warfarin (COUMADIN) 5 MG tablet, TAKE AS DIRECTED BY ANTICOAGULATION CLINIC, Disp: 135 tablet, Rfl: 0:  Allergies  Allergen Reactions  . Celecoxib Itching and Other (See Comments)    Joint pain  :  Family History  Problem Relation Age of Onset  . Liver cancer Father   . Colon cancer Neg Hx   :  History   Social History  . Marital Status: Single    Spouse Name: N/A  . Number of Children: 0  . Years of Education: MDIV   Occupational History  . [employer]   Social History Main Topics  . Smoking status: Former Smoker -- 1.00 packs/day for 15 years    Types: Cigarettes    Quit date: 10/10/2001  . Smokeless tobacco: Never Used  . Alcohol Use: Yes     Comment: one drink per day  . Drug Use: No  . Sexual Activity: Not Currently   Other Topics Concern  . Not on file   Social History Narrative   Pt lives at home alone.   Caffeine Use: Less than 1 cup daily.  :  Pertinent items are noted in HPI.  Exam: Blood pressure 144/77, pulse 76, temperature 98.6 F (37 C), temperature source Oral, resp. rate 18, height 5\' 8"  (1.727 m), weight 223 lb (101.152 kg), SpO2 100 %. General appearance: alert and cooperative Head: Normocephalic, without obvious abnormality Throat: lips, mucosa, and tongue normal; teeth and gums normal Neck: no adenopathy Back: negative Resp: clear to auscultation bilaterally Chest wall: no tenderness Cardio: regular rate and rhythm, S1, S2 normal, no murmur, click, rub or gallop GI: soft, non-tender; bowel sounds normal; no masses,  no organomegaly. Could not appreciate any ascites. Extremities: extremities normal, atraumatic, no cyanosis or edema Pulses: 2+ and symmetric Skin: Skin color, texture, turgor normal. No rashes or lesions Lymph nodes: Cervical, supraclavicular, and  axillary nodes normal.  Skin: 3 cm raised lesion noted on the anterior of his right leg with small satellite lesions noted. A smaller one noted on his left leg. No evidence of edema.   CBC    Component Value Date/Time   WBC 4.1 09/23/2014 0955   RBC 5.55 09/23/2014 0955   HGB 16.0 09/23/2014 0955   HCT 46.2 09/23/2014 0955   PLT 165.0 09/23/2014 0955   MCV 83.1 09/23/2014 0955   MCH 29.1 08/04/2013 0300   MCHC 34.6 09/23/2014 0955   RDW 13.6 09/23/2014 0955   LYMPHSABS 1.3 09/23/2014 0955   MONOABS 0.4 09/23/2014 0955   EOSABS 0.2 09/23/2014 0955   BASOSABS 0.0 09/23/2014 0955      Chemistry      Component Value Date/Time   NA 140  09/23/2014 0955   K 4.5 09/23/2014 0955   CL 106 09/23/2014 0955   CO2 30 09/23/2014 0955   BUN 32* 09/23/2014 0955   CREATININE 1.52* 09/23/2014 0955      Component Value Date/Time   CALCIUM 9.5 09/23/2014 0955   ALKPHOS 23* 09/23/2014 0955   AST 30 09/23/2014 0955   ALT 25 09/23/2014 0955   BILITOT 0.7 09/23/2014 0955       Assessment and Plan:    59 year old gentleman with the following issues:  1. 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 He was evaluated by Dr. Kendall Flack at [facility] in his HIV testing was negative. He felt that he might have sporadic case of Kaposi's sarcoma rather than HIV related case. He recommended proceeding with Doxil at 30 mg every 4 weeks for a total of 6-7 treatments to his lesions resolve. Mr. Hillhouse would like to have that done closer to home.  The risks and benefits of Doxil chemotherapy were discussed. Complications include nausea, vomiting, myelosuppression, neutropenia, neutropenic sepsis, cardiomyopathy, hand-foot syndrome were reviewed and is agreeable to proceed. I anticipate starting in the next week and every 4 weeks after that. He will  continue chemotherapy education class for completeness.  2. IV access: Risks and benefits of a Port-A-Cath was discussed today. Complications include bleeding, thrombosis, infection were reviewed. The benefit would be to show evidence of easy intravenous access and eliminate the risk of extravasation. I explained to him that extravasation could be a devastating problem especially in the setting of poor veins. After discussion today, he elected to defer a Port-A-Cath placement for the time being but he is open to the idea at some point.  3. Antiemetics: He is prescribed Compazine and Ativan.  4. Cardiomyopathy prophylaxis: He did have an echocardiogram on 12/10/2014 done at [facility] and showed a preserved LV function of 55-60%. We will continue to monitor this down the line with cumulative doses of Doxil.  5. Genetic link: It is unclear why he hasn't developed this illness associated with acute hearing loss and aortic dissection. There is no family history with any disease illnesses or any genetic disorder. Genetic testing might be reasonable consideration down the line.  All his questions were answered today to his satisfaction.

## 2015-02-03 NOTE — Telephone Encounter (Signed)
lvm for pt regarding to 7.12 time change.Marland KitchenMarland KitchenMarland Kitchen

## 2015-02-03 NOTE — Progress Notes (Signed)
Please see consult note.  

## 2015-02-03 NOTE — Telephone Encounter (Signed)
Per staff message and POF I have scheduled appts. Advised scheduler of appts and to move lab appt. JMW  

## 2015-02-05 ENCOUNTER — Other Ambulatory Visit: Payer: BLUE CROSS/BLUE SHIELD

## 2015-02-09 ENCOUNTER — Other Ambulatory Visit (HOSPITAL_BASED_OUTPATIENT_CLINIC_OR_DEPARTMENT_OTHER): Payer: BLUE CROSS/BLUE SHIELD

## 2015-02-09 ENCOUNTER — Ambulatory Visit (HOSPITAL_BASED_OUTPATIENT_CLINIC_OR_DEPARTMENT_OTHER): Payer: BLUE CROSS/BLUE SHIELD

## 2015-02-09 VITALS — BP 129/64 | HR 58 | Temp 97.2°F | Resp 16

## 2015-02-09 DIAGNOSIS — C467 Kaposi's sarcoma of other sites: Secondary | ICD-10-CM

## 2015-02-09 DIAGNOSIS — Z5111 Encounter for antineoplastic chemotherapy: Secondary | ICD-10-CM

## 2015-02-09 DIAGNOSIS — C469 Kaposi's sarcoma, unspecified: Secondary | ICD-10-CM

## 2015-02-09 LAB — COMPREHENSIVE METABOLIC PANEL (CC13)
ALBUMIN: 4.3 g/dL (ref 3.5–5.0)
ALT: 36 U/L (ref 0–55)
AST: 34 U/L (ref 5–34)
Alkaline Phosphatase: 26 U/L — ABNORMAL LOW (ref 40–150)
Anion Gap: 7 mEq/L (ref 3–11)
BUN: 26 mg/dL (ref 7.0–26.0)
CHLORIDE: 114 meq/L — AB (ref 98–109)
CO2: 23 mEq/L (ref 22–29)
Calcium: 9.4 mg/dL (ref 8.4–10.4)
Creatinine: 1.4 mg/dL — ABNORMAL HIGH (ref 0.7–1.3)
EGFR: 61 mL/min/{1.73_m2} — ABNORMAL LOW (ref >90)
GLUCOSE: 99 mg/dL (ref 70–140)
Potassium: 4.4 mEq/L (ref 3.5–5.1)
Sodium: 144 mEq/L (ref 136–145)
Total Bilirubin: 0.61 mg/dL (ref 0.20–1.20)
Total Protein: 6.4 g/dL (ref 6.4–8.3)

## 2015-02-09 LAB — CBC WITH DIFFERENTIAL/PLATELET
BASO%: 1.3 % (ref 0.0–2.0)
Basophils Absolute: 0.1 10*3/uL (ref 0.0–0.1)
EOS%: 4.4 % (ref 0.0–7.0)
Eosinophils Absolute: 0.2 10*3/uL (ref 0.0–0.5)
HEMATOCRIT: 45.5 % (ref 38.4–49.9)
HEMOGLOBIN: 15.4 g/dL (ref 13.0–17.1)
LYMPH#: 1.3 10*3/uL (ref 0.9–3.3)
LYMPH%: 28 % (ref 14.0–49.0)
MCH: 28.8 pg (ref 27.2–33.4)
MCHC: 33.8 g/dL (ref 32.0–36.0)
MCV: 85 fL (ref 79.3–98.0)
MONO#: 0.6 10*3/uL (ref 0.1–0.9)
MONO%: 13.2 % (ref 0.0–14.0)
NEUT#: 2.5 10*3/uL (ref 1.5–6.5)
NEUT%: 53.1 % (ref 39.0–75.0)
Platelets: 173 10*3/uL (ref 140–400)
RBC: 5.35 10*6/uL (ref 4.20–5.82)
RDW: 13.6 % (ref 11.0–14.6)
WBC: 4.7 10*3/uL (ref 4.0–10.3)

## 2015-02-09 MED ORDER — DOXORUBICIN HCL LIPOSOMAL CHEMO INJECTION 2 MG/ML
30.0000 mg/m2 | Freq: Once | INTRAVENOUS | Status: DC
  Filled 2015-02-09: qty 33

## 2015-02-09 MED ORDER — DOXORUBICIN HCL LIPOSOMAL CHEMO INJECTION 2 MG/ML
32.0000 mg/m2 | Freq: Once | INTRAVENOUS | Status: AC
  Administered 2015-02-09: 70 mg via INTRAVENOUS
  Filled 2015-02-09: qty 35

## 2015-02-09 MED ORDER — SODIUM CHLORIDE 0.9 % IV SOLN
Freq: Once | INTRAVENOUS | Status: AC
  Administered 2015-02-09: 12:00:00 via INTRAVENOUS

## 2015-02-09 MED ORDER — SODIUM CHLORIDE 0.9 % IV SOLN
Freq: Once | INTRAVENOUS | Status: AC
  Administered 2015-02-09: 12:00:00 via INTRAVENOUS
  Filled 2015-02-09: qty 4

## 2015-02-09 NOTE — Patient Instructions (Signed)
Doxorubicin Liposomal injection What is this medicine? LIPOSOMAL DOXORUBICIN (LIP oh som al dox oh ROO bi sin) is a chemotherapy drug. This medicine is used to treat many kinds of cancer like Kaposi's sarcoma, multiple myeloma, and ovarian cancer. This medicine may be used for other purposes; ask your health care provider or pharmacist if you have questions. COMMON BRAND NAME(S): Doxil, Lipodox What should I tell my health care provider before I take this medicine? They need to know if you have any of these conditions: -blood disorders -heart disease -infection (especially a virus infection such as chickenpox, cold sores, or herpes) -liver disease -recent or ongoing radiation therapy -an unusual or allergic reaction to doxorubicin, other chemotherapy agents, soybeans, other medicines, foods, dyes, or preservatives -pregnant or trying to get pregnant -breast-feeding How should I use this medicine? This drug is given as an infusion into a vein. It is administered in a hospital or clinic by a specially trained health care professional. If you have pain, swelling, burning or any unusual feeling around the site of your injection, tell your health care professional right away. Talk to your pediatrician regarding the use of this medicine in children. Special care may be needed. Overdosage: If you think you have taken too much of this medicine contact a poison control center or emergency room at once. NOTE: This medicine is only for you. Do not share this medicine with others. What if I miss a dose? It is important not to miss your dose. Call your doctor or health care professional if you are unable to keep an appointment. What may interact with this medicine? Do not take this medicine with any of the following medications: -zidovudine This medicine may also interact with the following medications: -medicines to increase blood counts like filgrastim, pegfilgrastim, sargramostim -vaccines Talk to  your doctor or health care professional before taking any of these medicines: -acetaminophen -aspirin -ibuprofen -ketoprofen -naproxen This list may not describe all possible interactions. Give your health care provider a list of all the medicines, herbs, non-prescription drugs, or dietary supplements you use. Also tell them if you smoke, drink alcohol, or use illegal drugs. Some items may interact with your medicine. What should I watch for while using this medicine? Your condition will be monitored carefully while you are receiving this medicine. You will need important blood work done while you are taking this medicine. This drug may make you feel generally unwell. This is not uncommon, as chemotherapy can affect healthy cells as well as cancer cells. Report any side effects. Continue your course of treatment even though you feel ill unless your doctor tells you to stop. Your urine may turn orange-red for a few days after your dose. This is not blood. If your urine is dark or brown, call your doctor. In some cases, you may be given additional medicines to help with side effects. Follow all directions for their use. Call your doctor or health care professional for advice if you get a fever (100.5 degrees F or higher), chills or sore throat, or other symptoms of a cold or flu. Do not treat yourself. This drug decreases your body's ability to fight infections. Try to avoid being around people who are sick. This medicine may increase your risk to bruise or bleed. Call your doctor or health care professional if you notice any unusual bleeding. Be careful brushing and flossing your teeth or using a toothpick because you may get an infection or bleed more easily. If you have any dental  work done, tell your dentist you are receiving this medicine. Avoid taking products that contain aspirin, acetaminophen, ibuprofen, naproxen, or ketoprofen unless instructed by your doctor. These medicines may hide a  fever. Men and women of childbearing age should use effective birth control methods while using taking this medicine. Do not become pregnant while taking this medicine. There is a potential for serious side effects to an unborn child. Talk to your health care professional or pharmacist for more information. Do not breast-feed an infant while taking this medicine. Talk to your doctor about your risk of cancer. You may be more at risk for certain types of cancers if you take this medicine. What side effects may I notice from receiving this medicine? Side effects that you should report to your doctor or health care professional as soon as possible: -allergic reactions like skin rash, itching or hives, swelling of the face, lips, or tongue -low blood counts - this medicine may decrease the number of white blood cells, red blood cells and platelets. You may be at increased risk for infections and bleeding. -signs of hand-foot syndrome - tingling or burning, redness, flaking, swelling, small blisters, or small sores on the palms of your hands or the soles of your feet -signs of infection - fever or chills, cough, sore throat, pain or difficulty passing urine -signs of decreased platelets or bleeding - bruising, pinpoint red spots on the skin, black, tarry stools, blood in the urine -signs of decreased red blood cells - unusually weak or tired, fainting spells, lightheadedness -back pain, chills, facial flushing, fever, headache, tightness in the chest or throat during the infusion -breathing problems -chest pain -fast, irregular heartbeat -mouth pain, redness, sores -pain, swelling, redness at site where injected -pain, tingling, numbness in the hands or feet -swelling of ankles, feet, or hands -vomiting Side effects that usually do not require medical attention (report to your doctor or health care professional if they continue or are bothersome): -diarrhea -hair loss -loss of appetite -nail  discoloration or damage -nausea -red or watery eyes -red colored urine -stomach upset This list may not describe all possible side effects. Call your doctor for medical advice about side effects. You may report side effects to FDA at 1-800-FDA-1088. Where should I keep my medicine? This drug is given in a hospital or clinic and will not be stored at home. NOTE: This sheet is a summary. It may not cover all possible information. If you have questions about this medicine, talk to your doctor, pharmacist, or health care provider.  2015, Elsevier/Gold Standard. (2012-04-05 10:12:56)  

## 2015-02-24 ENCOUNTER — Ambulatory Visit (INDEPENDENT_AMBULATORY_CARE_PROVIDER_SITE_OTHER): Payer: BLUE CROSS/BLUE SHIELD | Admitting: General Practice

## 2015-02-24 DIAGNOSIS — Z7901 Long term (current) use of anticoagulants: Secondary | ICD-10-CM

## 2015-02-24 DIAGNOSIS — Z954 Presence of other heart-valve replacement: Secondary | ICD-10-CM

## 2015-02-24 DIAGNOSIS — Z5181 Encounter for therapeutic drug level monitoring: Secondary | ICD-10-CM

## 2015-02-24 DIAGNOSIS — Z952 Presence of prosthetic heart valve: Secondary | ICD-10-CM

## 2015-02-24 LAB — POCT INR: INR: 2.4

## 2015-02-24 NOTE — Progress Notes (Signed)
I have reviewed and agree with the plan. 

## 2015-02-24 NOTE — Progress Notes (Signed)
Pre visit review using our clinic review tool, if applicable. No additional management support is needed unless otherwise documented below in the visit note. 

## 2015-03-12 ENCOUNTER — Other Ambulatory Visit (HOSPITAL_BASED_OUTPATIENT_CLINIC_OR_DEPARTMENT_OTHER): Payer: BLUE CROSS/BLUE SHIELD

## 2015-03-12 ENCOUNTER — Ambulatory Visit (HOSPITAL_BASED_OUTPATIENT_CLINIC_OR_DEPARTMENT_OTHER): Payer: BLUE CROSS/BLUE SHIELD | Admitting: Oncology

## 2015-03-12 ENCOUNTER — Telehealth: Payer: Self-pay | Admitting: Oncology

## 2015-03-12 ENCOUNTER — Ambulatory Visit (HOSPITAL_BASED_OUTPATIENT_CLINIC_OR_DEPARTMENT_OTHER): Payer: BLUE CROSS/BLUE SHIELD

## 2015-03-12 VITALS — BP 127/70 | HR 70 | Temp 98.3°F | Resp 17 | Ht 68.0 in | Wt 224.8 lb

## 2015-03-12 DIAGNOSIS — C467 Kaposi's sarcoma of other sites: Secondary | ICD-10-CM

## 2015-03-12 DIAGNOSIS — C469 Kaposi's sarcoma, unspecified: Secondary | ICD-10-CM | POA: Diagnosis not present

## 2015-03-12 DIAGNOSIS — Z5111 Encounter for antineoplastic chemotherapy: Secondary | ICD-10-CM

## 2015-03-12 DIAGNOSIS — R110 Nausea: Secondary | ICD-10-CM | POA: Diagnosis not present

## 2015-03-12 LAB — CBC WITH DIFFERENTIAL/PLATELET
BASO%: 1.2 % (ref 0.0–2.0)
Basophils Absolute: 0.1 10*3/uL (ref 0.0–0.1)
EOS ABS: 0.3 10*3/uL (ref 0.0–0.5)
EOS%: 4.8 % (ref 0.0–7.0)
HCT: 46.9 % (ref 38.4–49.9)
HEMOGLOBIN: 15.7 g/dL (ref 13.0–17.1)
LYMPH%: 29 % (ref 14.0–49.0)
MCH: 28.4 pg (ref 27.2–33.4)
MCHC: 33.6 g/dL (ref 32.0–36.0)
MCV: 84.5 fL (ref 79.3–98.0)
MONO#: 0.5 10*3/uL (ref 0.1–0.9)
MONO%: 9.9 % (ref 0.0–14.0)
NEUT#: 3 10*3/uL (ref 1.5–6.5)
NEUT%: 55.1 % (ref 39.0–75.0)
PLATELETS: 177 10*3/uL (ref 140–400)
RBC: 5.54 10*6/uL (ref 4.20–5.82)
RDW: 13.6 % (ref 11.0–14.6)
WBC: 5.4 10*3/uL (ref 4.0–10.3)
lymph#: 1.6 10*3/uL (ref 0.9–3.3)

## 2015-03-12 LAB — COMPREHENSIVE METABOLIC PANEL (CC13)
ALT: 31 U/L (ref 0–55)
AST: 30 U/L (ref 5–34)
Albumin: 4.3 g/dL (ref 3.5–5.0)
Alkaline Phosphatase: 25 U/L — ABNORMAL LOW (ref 40–150)
Anion Gap: 8 mEq/L (ref 3–11)
BUN: 24 mg/dL (ref 7.0–26.0)
CALCIUM: 9.2 mg/dL (ref 8.4–10.4)
CHLORIDE: 110 meq/L — AB (ref 98–109)
CO2: 22 meq/L (ref 22–29)
Creatinine: 1.6 mg/dL — ABNORMAL HIGH (ref 0.7–1.3)
EGFR: 56 mL/min/{1.73_m2} — ABNORMAL LOW (ref >90)
Glucose: 133 mg/dl (ref 70–140)
POTASSIUM: 4.3 meq/L (ref 3.5–5.1)
Sodium: 140 mEq/L (ref 136–145)
Total Bilirubin: 0.69 mg/dL (ref 0.20–1.20)
Total Protein: 6.3 g/dL — ABNORMAL LOW (ref 6.4–8.3)

## 2015-03-12 MED ORDER — SODIUM CHLORIDE 0.9 % IV SOLN
Freq: Once | INTRAVENOUS | Status: AC
  Administered 2015-03-12: 14:00:00 via INTRAVENOUS

## 2015-03-12 MED ORDER — SODIUM CHLORIDE 0.9 % IV SOLN
Freq: Once | INTRAVENOUS | Status: AC
  Administered 2015-03-12: 14:00:00 via INTRAVENOUS
  Filled 2015-03-12: qty 4

## 2015-03-12 MED ORDER — DOXORUBICIN HCL LIPOSOMAL CHEMO INJECTION 2 MG/ML
32.0000 mg/m2 | Freq: Once | INTRAVENOUS | Status: AC
  Administered 2015-03-12: 70 mg via INTRAVENOUS
  Filled 2015-03-12: qty 35

## 2015-03-12 NOTE — Patient Instructions (Signed)
[facility] Discharge Instructions for Patients Receiving Chemotherapy  Today you received the following chemotherapy agents :  Doxil.  To help prevent nausea and vomiting after your treatment, we encourage you to take your nausea medication as prescribed.   If you develop nausea and vomiting that is not controlled by your nausea medication, call the clinic.   BELOW ARE SYMPTOMS THAT SHOULD BE REPORTED IMMEDIATELY:  *FEVER GREATER THAN 100.5 F  *CHILLS WITH OR WITHOUT FEVER  NAUSEA AND VOMITING THAT IS NOT CONTROLLED WITH YOUR NAUSEA MEDICATION  *UNUSUAL SHORTNESS OF BREATH  *UNUSUAL BRUISING OR BLEEDING  TENDERNESS IN MOUTH AND THROAT WITH OR WITHOUT PRESENCE OF ULCERS  *URINARY PROBLEMS  *BOWEL PROBLEMS  UNUSUAL RASH Items with * indicate a potential emergency and should be followed up as soon as possible.  Feel free to call the clinic you have any questions or concerns. The clinic phone number is [phone].  Please show the CHEMO ALERT CARD at check-in to the Emergency Department and triage nurse.   

## 2015-03-12 NOTE — Progress Notes (Signed)
Hematology and Oncology Follow Up Visit  Austin [name] [id number] April 17, 1956 59 y.o. 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 He presented with cutaneous lesions in his lower extremities. He is HIV negative.   Current therapy: Doxil chemotherapy given at 30 mg/m every 4 weeks the first cycle given at [facility] in June 2016. He is here for cycle 3.  Interim History:  [name] presents today for a follow-up visit. Since his last visit, he received the second cycle of Doxil without any complications. He did not report any infusion-related complications. He did have grade 1 nausea that was easily manageable. He did not report any diarrhea or any other GI toxicities. He did not report any dermatological toxicities such as erythema or induration at his palms or soles. His Kaposi's lesions appear to be improving.  He does not report any headaches, blurry vision, syncope or seizures. He does not report any fevers, chills, sweats or weight loss. He does not report any chest pain, coaptation, orthopnea or leg edema. He does not report any cough, wheezing or shortness of breath. He does not report any nausea, vomiting, abdominal pain, hematochezia or. He does not report any frequency, urgency or hesitancy. He does not report any skeletal complaints. Remainder review of systems unremarkable.   Medications: I have reviewed the patient's current medications.  Current Outpatient Prescriptions  Medication Sig Dispense Refill  . Diclofenac Sodium 2 % SOLN Apply 1 pump twice daily. 112 g 3  . fenofibrate 160 MG tablet TAKE 1 TABLET DAILY 90 tablet 3  . ferrous sulfate 325 (65 FE) MG tablet Take 325 mg by mouth.    Marland Kitchen LORazepam (ATIVAN) 1 MG tablet Take 1 mg by mouth as needed.  0  . losartan (COZAAR) 50 MG tablet Take 1 tablet (50 mg total) by mouth daily. 90 tablet 3  . metoprolol  (TOPROL XL) 200 MG 24 hr tablet Take 1 tablet (200 mg total) by mouth daily. 90 tablet 3  . Multiple Vitamin (MULTIVITAMIN) tablet Take 1 tablet by mouth daily.    . prochlorperazine (COMPAZINE) 10 MG tablet Take 10 mg by mouth.    . rosuvastatin (CRESTOR) 40 MG tablet Take 1 tablet (40 mg total) by mouth daily. 90 tablet 3  . warfarin (COUMADIN) 5 MG tablet TAKE AS DIRECTED BY ANTICOAGULATION CLINIC 135 tablet 0   No current facility-administered medications for this visit.     Allergies:  Allergies  Allergen Reactions  . Celecoxib Itching and Other (See Comments)    Joint pain    Past Medical History, Surgical history, Social history, and Family History were reviewed and updated.   Physical Exam: Blood pressure 127/70, pulse 70, temperature 98.3 F (36.8 C), temperature source Oral, resp. rate 17, height 5\' 8"  (1.727 m), weight 224 lb 12.8 oz (101.969 kg), SpO2 100 %. ECOG: 0 General appearance: alert and cooperative Head: Normocephalic, without obvious abnormality Neck: no adenopathy Lymph nodes: Cervical, supraclavicular, and axillary nodes normal. Heart:regular rate and rhythm, S1, S2 normal, no murmur, click, rub or gallop Lung:chest clear, no wheezing, rales, normal symmetric air entry Abdomin: soft, non-tender, without masses or organomegaly EXT: Scaly raised lower extremity lesions noted a left more than the right.   Lab Results: Lab Results  Component Value Date   WBC 5.4 03/12/2015   HGB 15.7 03/12/2015   HCT 46.9 03/12/2015   MCV 84.5 03/12/2015  PLT 177 03/12/2015     Chemistry      Component Value Date/Time   NA 144 02/09/2015 1119   NA 140 09/23/2014 0955   K 4.4 02/09/2015 1119   K 4.5 09/23/2014 0955   CL 106 09/23/2014 0955   CO2 23 02/09/2015 1119   CO2 30 09/23/2014 0955   BUN 26.0 02/09/2015 1119   BUN 32* 09/23/2014 0955   CREATININE 1.4* 02/09/2015 1119   CREATININE 1.52* 09/23/2014 0955      Component Value Date/Time   CALCIUM 9.4  02/09/2015 1119   CALCIUM 9.5 09/23/2014 0955   ALKPHOS 26* 02/09/2015 1119   ALKPHOS 23* 09/23/2014 0955   AST 34 02/09/2015 1119   AST 30 09/23/2014 0955   ALT 36 02/09/2015 1119   ALT 25 09/23/2014 0955   BILITOT 0.61 02/09/2015 1119   BILITOT 0.7 09/23/2014 0955       Impression and Plan:   59 year old gentleman with the following issues:  1. 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 He was evaluated by Dr. Kendall Flack at [facility] in his HIV testing was negative. He felt that he might have sporadic case of Kaposi's sarcoma rather than HIV related case.   He is currently on Doxil at 30 mg/m every 4 weeks and he is ready to proceed with cycle 3. He did not have any complications with the first 2 cycles. The plan is to proceed without any dose reduction or delay for a total of 6 cycles and assess him further at this time.  2. IV access: Risks and benefits of a Port-A-Cath was discussed again but he opted to proceed with peripheral veins for the time being.  3. Antiemetics: He is prescribed Compazine and Ativan.  4. Cardiomyopathy prophylaxis: He did have an echocardiogram on 12/10/2014 done at [facility] and showed a preserved LV function of 55-60%. We will continue to monitor this down the line with cumulative doses of Doxil.  5. 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 We will consider referral to [facility] for an opinion after chemotherapy treatment is concluded.   Zola Button, MD 8/12/20161:36 PM

## 2015-03-12 NOTE — Telephone Encounter (Signed)
Pt confirmed labs/ov per 08/12 POF, gave pt AVS and Calendar.Cherylann Banas, sent msg to add chemo

## 2015-03-15 ENCOUNTER — Telehealth: Payer: Self-pay | Admitting: *Deleted

## 2015-03-15 NOTE — Telephone Encounter (Signed)
Per staff message and POF I have scheduled appts. Advised scheduler of appts. JMW  

## 2015-03-19 ENCOUNTER — Other Ambulatory Visit: Payer: Self-pay | Admitting: Internal Medicine

## 2015-04-07 ENCOUNTER — Ambulatory Visit: Payer: BLUE CROSS/BLUE SHIELD

## 2015-04-08 ENCOUNTER — Telehealth: Payer: Self-pay | Admitting: Internal Medicine

## 2015-04-09 ENCOUNTER — Ambulatory Visit (INDEPENDENT_AMBULATORY_CARE_PROVIDER_SITE_OTHER): Payer: BLUE CROSS/BLUE SHIELD | Admitting: General Practice

## 2015-04-09 DIAGNOSIS — Z954 Presence of other heart-valve replacement: Secondary | ICD-10-CM | POA: Diagnosis not present

## 2015-04-09 DIAGNOSIS — Z5181 Encounter for therapeutic drug level monitoring: Secondary | ICD-10-CM

## 2015-04-09 DIAGNOSIS — Z952 Presence of prosthetic heart valve: Secondary | ICD-10-CM

## 2015-04-09 DIAGNOSIS — Z7901 Long term (current) use of anticoagulants: Secondary | ICD-10-CM

## 2015-04-09 LAB — POCT INR: INR: 2.8

## 2015-04-09 NOTE — Progress Notes (Signed)
Pre visit review using our clinic review tool, if applicable. No additional management support is needed unless otherwise documented below in the visit note. 

## 2015-04-09 NOTE — Progress Notes (Signed)
I have reviewed and agree with the plan. 

## 2015-04-20 ENCOUNTER — Telehealth: Payer: Self-pay | Admitting: Oncology

## 2015-04-20 ENCOUNTER — Ambulatory Visit (HOSPITAL_BASED_OUTPATIENT_CLINIC_OR_DEPARTMENT_OTHER): Payer: BLUE CROSS/BLUE SHIELD | Admitting: Oncology

## 2015-04-20 ENCOUNTER — Other Ambulatory Visit (HOSPITAL_BASED_OUTPATIENT_CLINIC_OR_DEPARTMENT_OTHER): Payer: BLUE CROSS/BLUE SHIELD

## 2015-04-20 ENCOUNTER — Ambulatory Visit (HOSPITAL_BASED_OUTPATIENT_CLINIC_OR_DEPARTMENT_OTHER): Payer: BLUE CROSS/BLUE SHIELD

## 2015-04-20 VITALS — BP 134/72 | HR 77 | Temp 98.1°F | Resp 16 | Ht 68.0 in | Wt 224.8 lb

## 2015-04-20 DIAGNOSIS — C469 Kaposi's sarcoma, unspecified: Secondary | ICD-10-CM

## 2015-04-20 DIAGNOSIS — C467 Kaposi's sarcoma of other sites: Secondary | ICD-10-CM

## 2015-04-20 DIAGNOSIS — Z5111 Encounter for antineoplastic chemotherapy: Secondary | ICD-10-CM

## 2015-04-20 DIAGNOSIS — I429 Cardiomyopathy, unspecified: Secondary | ICD-10-CM | POA: Diagnosis not present

## 2015-04-20 LAB — COMPREHENSIVE METABOLIC PANEL (CC13)
ALT: 64 U/L — AB (ref 0–55)
AST: 51 U/L — ABNORMAL HIGH (ref 5–34)
Albumin: 4.3 g/dL (ref 3.5–5.0)
Alkaline Phosphatase: 31 U/L — ABNORMAL LOW (ref 40–150)
Anion Gap: 7 mEq/L (ref 3–11)
BILIRUBIN TOTAL: 0.73 mg/dL (ref 0.20–1.20)
BUN: 29 mg/dL — ABNORMAL HIGH (ref 7.0–26.0)
CO2: 22 meq/L (ref 22–29)
CREATININE: 1.6 mg/dL — AB (ref 0.7–1.3)
Calcium: 9.2 mg/dL (ref 8.4–10.4)
Chloride: 111 mEq/L — ABNORMAL HIGH (ref 98–109)
EGFR: 55 mL/min/{1.73_m2} — ABNORMAL LOW (ref >90)
GLUCOSE: 121 mg/dL (ref 70–140)
Potassium: 4.7 mEq/L (ref 3.5–5.1)
SODIUM: 141 meq/L (ref 136–145)
TOTAL PROTEIN: 6.6 g/dL (ref 6.4–8.3)

## 2015-04-20 LAB — CBC WITH DIFFERENTIAL/PLATELET
BASO%: 1.1 % (ref 0.0–2.0)
Basophils Absolute: 0.1 10*3/uL (ref 0.0–0.1)
EOS%: 6.6 % (ref 0.0–7.0)
Eosinophils Absolute: 0.4 10*3/uL (ref 0.0–0.5)
HCT: 45.9 % (ref 38.4–49.9)
HGB: 15.8 g/dL (ref 13.0–17.1)
LYMPH%: 24.1 % (ref 14.0–49.0)
MCH: 29.1 pg (ref 27.2–33.4)
MCHC: 34.4 g/dL (ref 32.0–36.0)
MCV: 84.6 fL (ref 79.3–98.0)
MONO#: 0.7 10*3/uL (ref 0.1–0.9)
MONO%: 10.9 % (ref 0.0–14.0)
NEUT%: 57.3 % (ref 39.0–75.0)
NEUTROS ABS: 3.7 10*3/uL (ref 1.5–6.5)
Platelets: 194 10*3/uL (ref 140–400)
RBC: 5.43 10*6/uL (ref 4.20–5.82)
RDW: 14.6 % (ref 11.0–14.6)
WBC: 6.4 10*3/uL (ref 4.0–10.3)
lymph#: 1.5 10*3/uL (ref 0.9–3.3)

## 2015-04-20 MED ORDER — SODIUM CHLORIDE 0.9 % IV SOLN
Freq: Once | INTRAVENOUS | Status: AC
  Administered 2015-04-20: 14:00:00 via INTRAVENOUS
  Filled 2015-04-20: qty 4

## 2015-04-20 MED ORDER — DEXTROSE 5 % IV SOLN
INTRAVENOUS | Status: DC
  Administered 2015-04-20: 14:00:00 via INTRAVENOUS

## 2015-04-20 MED ORDER — DOXORUBICIN HCL LIPOSOMAL CHEMO INJECTION 2 MG/ML
32.0000 mg/m2 | Freq: Once | INTRAVENOUS | Status: AC
  Administered 2015-04-20: 70 mg via INTRAVENOUS
  Filled 2015-04-20: qty 10

## 2015-04-20 NOTE — Progress Notes (Signed)
Hematology and Oncology Follow Up Visit  Austin [name] [id number] March 06, 1956 59 y.o. 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 He presented with cutaneous lesions in his lower extremities. He is HIV negative.   Current therapy: Doxil chemotherapy given at 30 mg/m every 4 weeks the first cycle given at [facility] in June 2016. He is here for cycle 4.  Interim History:  Austin [name] presents today for a follow-up visit. Since his last visit, he continues to do well without any complications. He received the third cycle of Doxil without any complications. He did not report any infusion-related complications. He did have grade 1 nausea that was easily manageable.  He denied vomiting. He did not report any diarrhea or any other GI toxicities. He did not report any dermatological toxicities such as erythema or induration at his palms or soles. His Kaposi's lesions appear to be improving. He does not report any new lesions or lymphadenopathy.  He does not report any headaches, blurry vision, syncope or seizures. He does not report any fevers, chills, sweats or weight loss. He does not report any chest pain, coaptation, orthopnea or leg edema. He does not report any cough, wheezing or shortness of breath. He does not report any nausea, vomiting, abdominal pain, hematochezia or. He does not report any frequency, urgency or hesitancy. He does not report any skeletal complaints. Remainder review of systems unremarkable.   Medications: I have reviewed the patient's current medications.  Current Outpatient Prescriptions  Medication Sig Dispense Refill  . Diclofenac Sodium 2 % SOLN Apply 1 pump twice daily. 112 g 3  . fenofibrate 160 MG tablet TAKE 1 TABLET DAILY 90 tablet 3  . ferrous sulfate 325 (65 FE) MG tablet Take 325 mg by mouth.    Marland Kitchen LORazepam (ATIVAN) 1 MG tablet Take 1 mg by  mouth as needed.  0  . losartan (COZAAR) 50 MG tablet Take 1 tablet (50 mg total) by mouth daily. 90 tablet 3  . metoprolol (TOPROL XL) 200 MG 24 hr tablet Take 1 tablet (200 mg total) by mouth daily. 90 tablet 3  . Multiple Vitamin (MULTIVITAMIN) tablet Take 1 tablet by mouth daily.    . prochlorperazine (COMPAZINE) 10 MG tablet Take 10 mg by mouth.    . rosuvastatin (CRESTOR) 40 MG tablet Take 1 tablet (40 mg total) by mouth daily. 90 tablet 3  . warfarin (COUMADIN) 5 MG tablet TAKE AS DIRECTED BY ANTICOAGULATION CLINIC 135 tablet 0   No current facility-administered medications for this visit.     Allergies:  Allergies  Allergen Reactions  . Celecoxib Itching and Other (See Comments)    Joint pain    Past Medical History, Surgical history, Social history, and Family History were reviewed and updated.   Physical Exam: Blood pressure 134/72, pulse 77, temperature 98.1 F (36.7 C), temperature source Oral, resp. rate 16, height 5\' 8"  (1.727 m), weight 224 lb 12.8 oz (101.969 kg), SpO2 100 %. ECOG: 0 General appearance: alert and cooperative not in any distress. Head: Normocephalic, without obvious abnormality Neck: no adenopathy Lymph nodes: Cervical, supraclavicular, and axillary nodes normal. Heart:regular rate and rhythm, S1, S2 normal, no murmur, click, rub or gallop Lung:chest clear, no wheezing, rales, normal symmetric air entry Abdomin: soft, non-tender, without masses or organomegaly EXT: Scaly raised lower extremity lesions noted a left more than the right. Continue to improve from previous  examination.   Lab Results: Lab Results  Component Value Date   WBC 6.4 04/20/2015   HGB 15.8 04/20/2015   HCT 45.9 04/20/2015   MCV 84.6 04/20/2015   PLT 194 04/20/2015     Chemistry      Component Value Date/Time   NA 140 03/12/2015 1307   NA 140 09/23/2014 0955   K 4.3 03/12/2015 1307   K 4.5 09/23/2014 0955   CL 106 09/23/2014 0955   CO2 22 03/12/2015 1307   CO2  30 09/23/2014 0955   BUN 24.0 03/12/2015 1307   BUN 32* 09/23/2014 0955   CREATININE 1.6* 03/12/2015 1307   CREATININE 1.52* 09/23/2014 0955      Component Value Date/Time   CALCIUM 9.2 03/12/2015 1307   CALCIUM 9.5 09/23/2014 0955   ALKPHOS 25* 03/12/2015 1307   ALKPHOS 23* 09/23/2014 0955   AST 30 03/12/2015 1307   AST 30 09/23/2014 0955   ALT 31 03/12/2015 1307   ALT 25 09/23/2014 0955   BILITOT 0.69 03/12/2015 1307   BILITOT 0.7 09/23/2014 0955       Impression and Plan:   59 year old gentleman with the following issues:  1. 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 He was evaluated by Dr. Kendall Flack at [facility] in his HIV testing was negative. He felt that he might have sporadic case of Kaposi's sarcoma rather than HIV related case.   He is currently on Doxil at 30 mg/m every 4 weeks and he is ready to proceed with cycle 4. He did not have any complications with the first 3 cycles. The plan is to proceed without any dose reduction or delay for a total of 6 cycles and assess him further at this time.  2. IV access: Risks and benefits of a Port-A-Cath  Have reviewed in the past and he continues to decline.  3. Antiemetics: He is prescribed Compazine and Ativan. No issues at this time.  4. Cardiomyopathy prophylaxis: He did have an echocardiogram on 12/10/2014 done at [facility] and showed a preserved LV function of 55-60%. We will continue to monitor this down the line with cumulative doses of Doxil.  5. 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 We will consider referral for genetic counseling.   Zola Button, MD 9/20/20161:26 PM

## 2015-04-20 NOTE — Progress Notes (Signed)
Okay to treat with labs today per Dr. Alen Blew (Creatinine 1.6)

## 2015-04-20 NOTE — Patient Instructions (Signed)
[facility] Discharge Instructions for Patients Receiving Chemotherapy  Today you received the following chemotherapy agents: Doxil  To help prevent nausea and vomiting after your treatment, we encourage you to take your nausea medication as ordered per MD.   If you develop nausea and vomiting that is not controlled by your nausea medication, call the clinic.   BELOW ARE SYMPTOMS THAT SHOULD BE REPORTED IMMEDIATELY:  *FEVER GREATER THAN 100.5 F  *CHILLS WITH OR WITHOUT FEVER  NAUSEA AND VOMITING THAT IS NOT CONTROLLED WITH YOUR NAUSEA MEDICATION  *UNUSUAL SHORTNESS OF BREATH  *UNUSUAL BRUISING OR BLEEDING  TENDERNESS IN MOUTH AND THROAT WITH OR WITHOUT PRESENCE OF ULCERS  *URINARY PROBLEMS  *BOWEL PROBLEMS  UNUSUAL RASH Items with * indicate a potential emergency and should be followed up as soon as possible.  Feel free to call the clinic you have any questions or concerns. The clinic phone number is [phone].  Please show the [place] at check-in to the Emergency Department and triage nurse.

## 2015-04-20 NOTE — Telephone Encounter (Signed)
Gave and printed papt sched and avs for pt for OCT and NOV.Marland Kitchenper orders MD he will not be in the office the week of nov 14th. He advised to wait to sched to f/u appt.

## 2015-05-06 ENCOUNTER — Telehealth: Payer: Self-pay | Admitting: *Deleted

## 2015-05-06 NOTE — Addendum Note (Signed)
Addended by: Thompson Grayer on: 05/06/2015 10:49 AM   Modules accepted: Orders

## 2015-05-06 NOTE — Telephone Encounter (Signed)
Pt due for CTA of chest and abdomen in October.  This will be done at [facility] with bicarb protocol.  Pt will need to go to Radiology 1 hour prior to CT and will stay 2 hours after CT.  He will need BMP a few days prior to CT.  I placed call to pt to schedule these.  Left message to call back.

## 2015-05-06 NOTE — Telephone Encounter (Signed)
Spoke with pt. He reports he had CT done in May at [facility].  He is now being followed by oncology in [place] and records have been sent to them.  Will review chart to determine what type of CT pt had done.

## 2015-05-07 NOTE — Telephone Encounter (Signed)
CT scan at [facility] (done 12/10/14) reviewed with Dr. Angelena Form.  Pt does not need repeat CT at this time. (CT is scanned in note dated 01/07/15 under media tab).  I placed call to pt to let him know. Left message to call back

## 2015-05-11 NOTE — Telephone Encounter (Signed)
Pt notified that he does not need CT scan at this time.

## 2015-05-19 ENCOUNTER — Ambulatory Visit (INDEPENDENT_AMBULATORY_CARE_PROVIDER_SITE_OTHER): Payer: BLUE CROSS/BLUE SHIELD | Admitting: General Practice

## 2015-05-19 DIAGNOSIS — Z7901 Long term (current) use of anticoagulants: Secondary | ICD-10-CM

## 2015-05-19 DIAGNOSIS — Z952 Presence of prosthetic heart valve: Secondary | ICD-10-CM

## 2015-05-19 DIAGNOSIS — Z954 Presence of other heart-valve replacement: Secondary | ICD-10-CM

## 2015-05-19 DIAGNOSIS — Z5181 Encounter for therapeutic drug level monitoring: Secondary | ICD-10-CM

## 2015-05-19 LAB — POCT INR: INR: 2.6

## 2015-05-19 NOTE — Progress Notes (Signed)
Pre visit review using our clinic review tool, if applicable. No additional management support is needed unless otherwise documented below in the visit note. 

## 2015-05-19 NOTE — Progress Notes (Signed)
I have reviewed and agree with the plan. 

## 2015-05-21 ENCOUNTER — Other Ambulatory Visit (HOSPITAL_BASED_OUTPATIENT_CLINIC_OR_DEPARTMENT_OTHER): Payer: BLUE CROSS/BLUE SHIELD

## 2015-05-21 ENCOUNTER — Ambulatory Visit (HOSPITAL_BASED_OUTPATIENT_CLINIC_OR_DEPARTMENT_OTHER): Payer: BLUE CROSS/BLUE SHIELD

## 2015-05-21 ENCOUNTER — Ambulatory Visit (HOSPITAL_BASED_OUTPATIENT_CLINIC_OR_DEPARTMENT_OTHER): Payer: BLUE CROSS/BLUE SHIELD | Admitting: Oncology

## 2015-05-21 ENCOUNTER — Telehealth: Payer: Self-pay | Admitting: Oncology

## 2015-05-21 VITALS — BP 117/70 | HR 66 | Temp 97.4°F | Resp 20 | Ht 68.0 in | Wt 222.0 lb

## 2015-05-21 DIAGNOSIS — Z23 Encounter for immunization: Secondary | ICD-10-CM

## 2015-05-21 DIAGNOSIS — C467 Kaposi's sarcoma of other sites: Secondary | ICD-10-CM

## 2015-05-21 DIAGNOSIS — Z5111 Encounter for antineoplastic chemotherapy: Secondary | ICD-10-CM

## 2015-05-21 DIAGNOSIS — I429 Cardiomyopathy, unspecified: Secondary | ICD-10-CM | POA: Diagnosis not present

## 2015-05-21 DIAGNOSIS — C469 Kaposi's sarcoma, unspecified: Secondary | ICD-10-CM

## 2015-05-21 LAB — COMPREHENSIVE METABOLIC PANEL (CC13)
ALT: 27 U/L (ref 0–55)
ANION GAP: 8 meq/L (ref 3–11)
AST: 30 U/L (ref 5–34)
Albumin: 4.3 g/dL (ref 3.5–5.0)
Alkaline Phosphatase: 25 U/L — ABNORMAL LOW (ref 40–150)
BUN: 29.7 mg/dL — ABNORMAL HIGH (ref 7.0–26.0)
CHLORIDE: 113 meq/L — AB (ref 98–109)
CO2: 21 meq/L — AB (ref 22–29)
CREATININE: 1.7 mg/dL — AB (ref 0.7–1.3)
Calcium: 9.5 mg/dL (ref 8.4–10.4)
EGFR: 49 mL/min/{1.73_m2} — ABNORMAL LOW (ref >90)
Glucose: 132 mg/dl (ref 70–140)
POTASSIUM: 4.6 meq/L (ref 3.5–5.1)
Sodium: 142 mEq/L (ref 136–145)
Total Bilirubin: 0.71 mg/dL (ref 0.20–1.20)
Total Protein: 6.5 g/dL (ref 6.4–8.3)

## 2015-05-21 LAB — CBC WITH DIFFERENTIAL/PLATELET
BASO%: 0.6 % (ref 0.0–2.0)
Basophils Absolute: 0 10*3/uL (ref 0.0–0.1)
EOS ABS: 0.2 10*3/uL (ref 0.0–0.5)
EOS%: 3.6 % (ref 0.0–7.0)
HEMATOCRIT: 43.9 % (ref 38.4–49.9)
HEMOGLOBIN: 15.4 g/dL (ref 13.0–17.1)
LYMPH#: 1.3 10*3/uL (ref 0.9–3.3)
LYMPH%: 26.3 % (ref 14.0–49.0)
MCH: 29.4 pg (ref 27.2–33.4)
MCHC: 35.1 g/dL (ref 32.0–36.0)
MCV: 83.8 fL (ref 79.3–98.0)
MONO#: 0.5 10*3/uL (ref 0.1–0.9)
MONO%: 10.8 % (ref 0.0–14.0)
NEUT%: 58.7 % (ref 39.0–75.0)
NEUTROS ABS: 3 10*3/uL (ref 1.5–6.5)
NRBC: 0 % (ref 0–0)
PLATELETS: 195 10*3/uL (ref 140–400)
RBC: 5.24 10*6/uL (ref 4.20–5.82)
RDW: 13.4 % (ref 11.0–14.6)
WBC: 5 10*3/uL (ref 4.0–10.3)

## 2015-05-21 MED ORDER — INFLUENZA VAC SPLIT QUAD 0.5 ML IM SUSY
0.5000 mL | PREFILLED_SYRINGE | Freq: Once | INTRAMUSCULAR | Status: AC
  Administered 2015-05-21: 0.5 mL via INTRAMUSCULAR
  Filled 2015-05-21: qty 0.5

## 2015-05-21 MED ORDER — DEXTROSE 5 % IV SOLN
INTRAVENOUS | Status: DC
  Administered 2015-05-21: 14:00:00 via INTRAVENOUS

## 2015-05-21 MED ORDER — DOXORUBICIN HCL LIPOSOMAL CHEMO INJECTION 2 MG/ML
32.0000 mg/m2 | Freq: Once | INTRAVENOUS | Status: AC
  Administered 2015-05-21: 70 mg via INTRAVENOUS
  Filled 2015-05-21: qty 10

## 2015-05-21 MED ORDER — SODIUM CHLORIDE 0.9 % IV SOLN
Freq: Once | INTRAVENOUS | Status: AC
  Administered 2015-05-21: 14:00:00 via INTRAVENOUS
  Filled 2015-05-21: qty 4

## 2015-05-21 NOTE — Progress Notes (Signed)
Hematology and Oncology Follow Up Visit  Austin [name] [id number] 1955/12/28 59 y.o. 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 He presented with cutaneous lesions in his lower extremities. He is HIV negative.   Current therapy: Doxil chemotherapy given at 30 mg/m every 4 weeks the first cycle given at [facility] in June 2016. He is here for cycle 5.  Interim History:  Austin [name] presents today for a follow-up visit. Since his last visit, he reports no new complaints. He received the third cycle of Doxil without any  complications. He did have grade 1 nausea that was easily manageable. He did not report any diarrhea or any other GI toxicities. He did not report any dermatological toxicities such as erythema or induration at his palms or soles.   His Kaposi's lesions appear to be improving. The larger lesion on his right leg continue to improve. He does not report any new lesions or lymphadenopathy.  He does not report any headaches, blurry vision, syncope or seizures. He does not report any fevers, chills, sweats or weight loss. He does not report any chest pain, coaptation, orthopnea or leg edema. He does not report any cough, wheezing or shortness of breath. He does not report any nausea, vomiting, abdominal pain, hematochezia or. He does not report any frequency, urgency or hesitancy. He does not report any skeletal complaints. Remainder review of systems unremarkable.   Medications: I have reviewed the patient's current medications.  Current Outpatient Prescriptions  Medication Sig Dispense Refill  . Diclofenac Sodium 2 % SOLN Apply 1 pump twice daily. 112 g 3  . fenofibrate 160 MG tablet TAKE 1 TABLET DAILY 90 tablet 3  . ferrous sulfate 325 (65 FE) MG tablet Take 325 mg by mouth.    Marland Kitchen LORazepam (ATIVAN) 1 MG tablet Take 1 mg by mouth as needed.  0  . losartan  (COZAAR) 50 MG tablet Take 1 tablet (50 mg total) by mouth daily. 90 tablet 3  . metoprolol (TOPROL XL) 200 MG 24 hr tablet Take 1 tablet (200 mg total) by mouth daily. 90 tablet 3  . Multiple Vitamin (MULTIVITAMIN) tablet Take 1 tablet by mouth daily.    . prochlorperazine (COMPAZINE) 10 MG tablet Take 10 mg by mouth as needed.     . rosuvastatin (CRESTOR) 40 MG tablet Take 1 tablet (40 mg total) by mouth daily. 90 tablet 3  . warfarin (COUMADIN) 5 MG tablet TAKE AS DIRECTED BY ANTICOAGULATION CLINIC 135 tablet 0   No current facility-administered medications for this visit.     Allergies:  Allergies  Allergen Reactions  . Celecoxib Itching and Other (See Comments)    Joint pain    Past Medical History, Surgical history, Social history, and Family History were reviewed and updated.   Physical Exam: Blood pressure 117/70, pulse 66, temperature 97.4 F (36.3 C), temperature source Oral, resp. rate 20, height 5\' 8"  (1.727 m), weight 222 lb (100.699 kg), SpO2 99 %. ECOG: 0 General appearance: alert and cooperative well-appearing gentleman without distress. Head: Normocephalic, without obvious abnormality no oral ulcers or lesions. Neck: no adenopathy Lymph nodes: Cervical, supraclavicular, and axillary nodes normal. Heart:regular rate and rhythm, S1, S2 normal, no murmur, click, rub or gallop Lung:chest clear, no wheezing, rales, normal symmetric air entry Abdomin: soft, non-tender, without masses or organomegaly EXT: Scaly raised lower extremity lesions noted a left more than the right.  Continue to improve from previous examination.   Lab Results: Lab Results  Component Value Date   WBC 5.0 05/21/2015   HGB 15.4 05/21/2015   HCT 43.9 05/21/2015   MCV 83.8 05/21/2015   PLT 195 05/21/2015     Chemistry      Component Value Date/Time   NA 141 04/20/2015 1303   NA 140 09/23/2014 0955   K 4.7 04/20/2015 1303   K 4.5 09/23/2014 0955   CL 106 09/23/2014 0955   CO2 22  04/20/2015 1303   CO2 30 09/23/2014 0955   BUN 29.0* 04/20/2015 1303   BUN 32* 09/23/2014 0955   CREATININE 1.6* 04/20/2015 1303   CREATININE 1.52* 09/23/2014 0955      Component Value Date/Time   CALCIUM 9.2 04/20/2015 1303   CALCIUM 9.5 09/23/2014 0955   ALKPHOS 31* 04/20/2015 1303   ALKPHOS 23* 09/23/2014 0955   AST 51* 04/20/2015 1303   AST 30 09/23/2014 0955   ALT 64* 04/20/2015 1303   ALT 25 09/23/2014 0955   BILITOT 0.73 04/20/2015 1303   BILITOT 0.7 09/23/2014 0955       Impression and Plan:   59 year old gentleman with the following issues:  1. 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 He was evaluated by Dr. Kendall Flack at [facility] in his HIV testing was negative. He felt that he might have sporadic case of Kaposi's sarcoma rather than HIV related case.   He is currently on Doxil at 30 mg/m every 4 weeks and he is ready to proceed with cycle 5.   The plan is to proceed with cycle 6 of treatment on 06/17/2015 along his laboratory parameters are within normal range. He will follow-up in December to discuss future treatment options at that time.  2. IV access: Risks and benefits of a Port-A-Cath have been reviewed in the past and have declined.  3. Antiemetics: He is prescribed Compazine and Ativan. No issues at this time.  4. Cardiomyopathy prophylaxis: He did have an echocardiogram on 12/10/2014 done at [facility] and showed a preserved LV function of 55-60%. Future echocardiogram ordered by his cardiologist.  5. 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 We will consider referral for genetic counseling or a tertiary care center.   Zola Button, MD 10/21/20161:23 PM

## 2015-05-21 NOTE — Progress Notes (Signed)
Creatinine 1.7, okay to treat per [facility], Dr. Hazeline Junker Nurse.

## 2015-05-21 NOTE — Patient Instructions (Signed)
[facility] Discharge Instructions for Patients Receiving Chemotherapy  Today you received the following chemotherapy agents: Doxil  To help prevent nausea and vomiting after your treatment, we encourage you to take your nausea medication as directed.  If you develop nausea and vomiting that is not controlled by your nausea medication, call the clinic.   BELOW ARE SYMPTOMS THAT SHOULD BE REPORTED IMMEDIATELY:  *FEVER GREATER THAN 100.5 F  *CHILLS WITH OR WITHOUT FEVER  NAUSEA AND VOMITING THAT IS NOT CONTROLLED WITH YOUR NAUSEA MEDICATION  *UNUSUAL SHORTNESS OF BREATH  *UNUSUAL BRUISING OR BLEEDING  TENDERNESS IN MOUTH AND THROAT WITH OR WITHOUT PRESENCE OF ULCERS  *URINARY PROBLEMS  *BOWEL PROBLEMS  UNUSUAL RASH Items with * indicate a potential emergency and should be followed up as soon as possible.  Feel free to call the clinic you have any questions or concerns. The clinic phone number is [phone].  Please show the CHEMO ALERT CARD at check-in to the Emergency Department and triage nurse.   

## 2015-05-21 NOTE — Telephone Encounter (Signed)
Gave adn printed appt sched and avs foer pt for NOV and DEC

## 2015-06-09 IMAGING — CT CT OUTSIDE FILMS BODY
3 of 5 series · 16 of 46 positions shown, 20 images · non-contrast
Comparison: none

[Series 4: std .625 · axial · 0.76mm/px · z∈[-647,-69]mm · 12 of 1029 slices shown, 16 images]
[im 52/1029  soft-tissue]
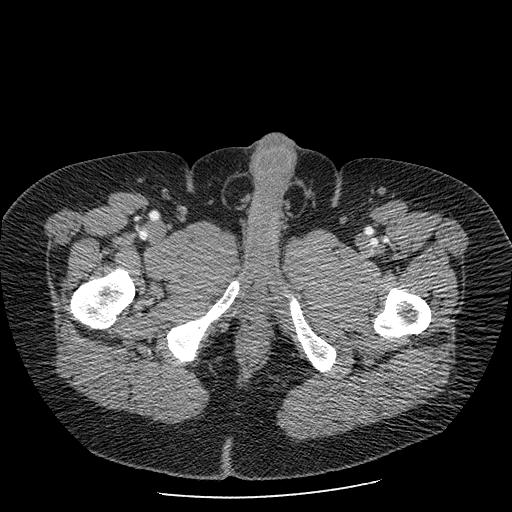
[im 52/1029  bone]
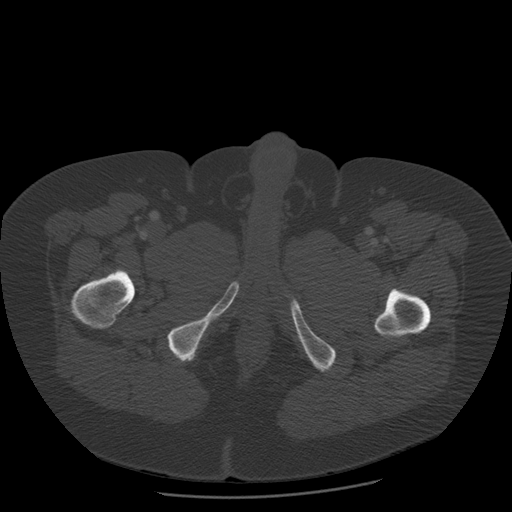
[im 155/1029  soft-tissue]
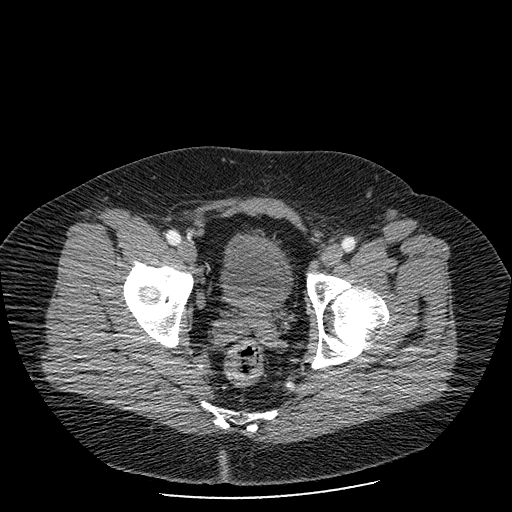
[im 258/1029  soft-tissue]
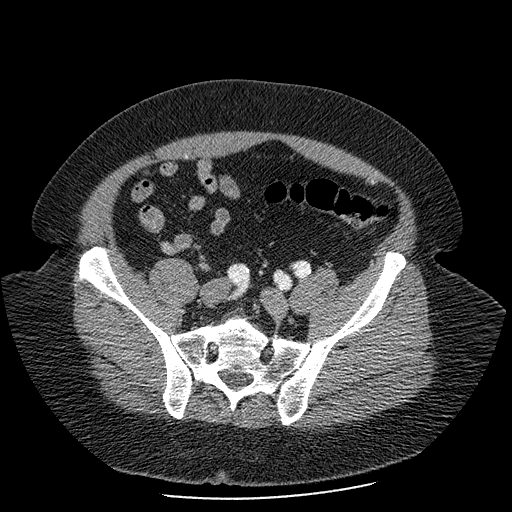
[im 360/1029  soft-tissue]
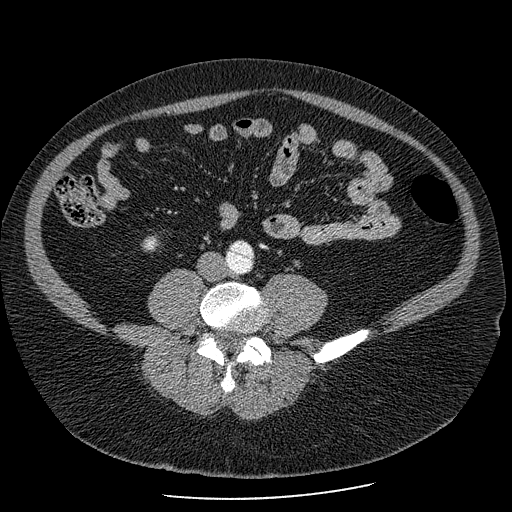
[im 463/1029  soft-tissue]
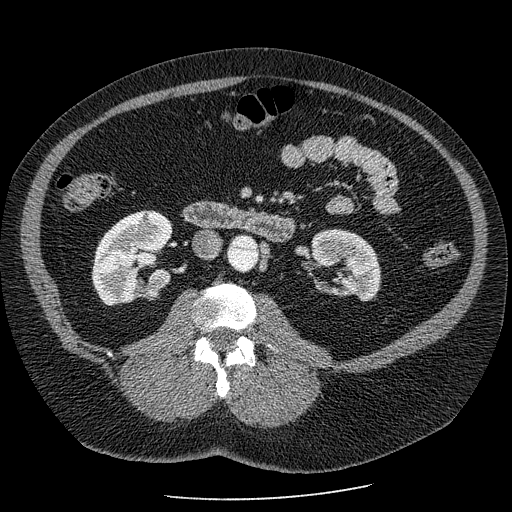
[im 566/1029  soft-tissue]
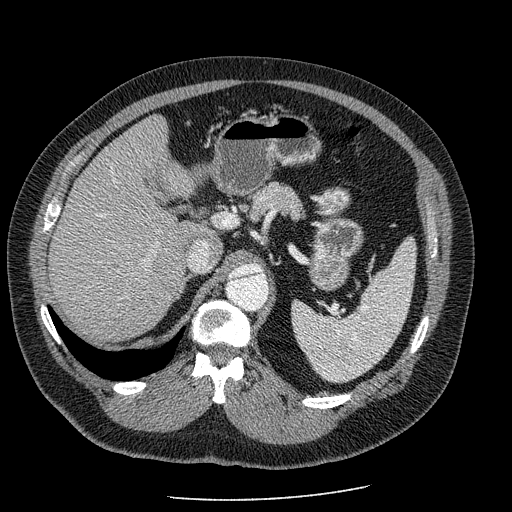
[im 669/1029  soft-tissue]
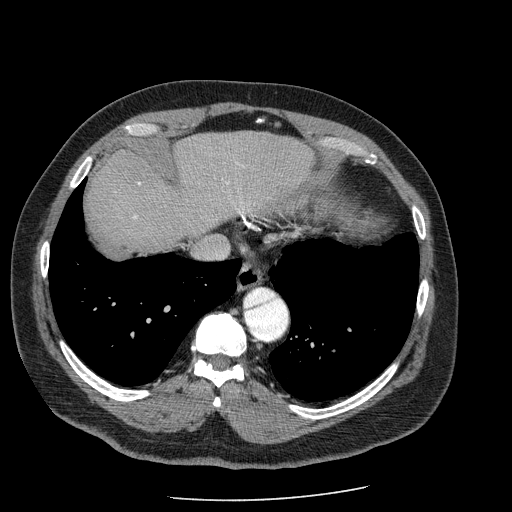
[im 772/1029  soft-tissue]
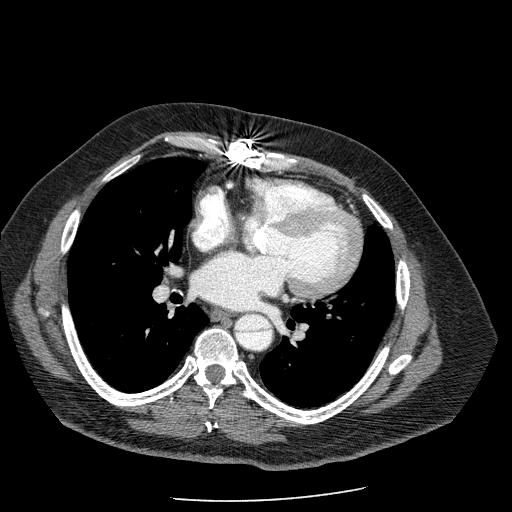
[im 823/1029  lung]
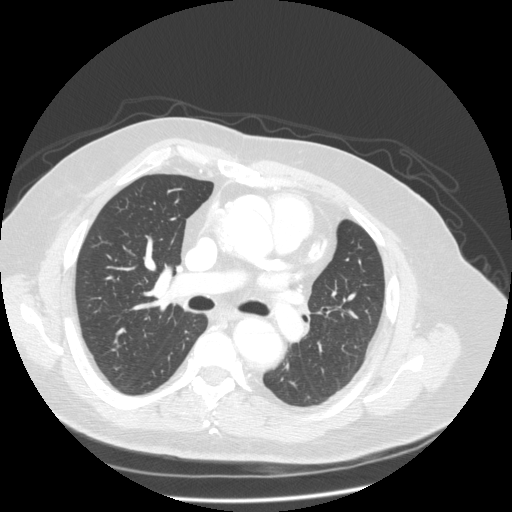
[im 874/1029  soft-tissue]
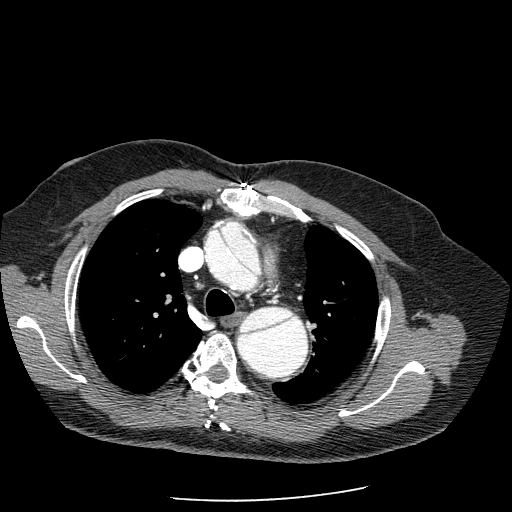
[im 874/1029  lung]
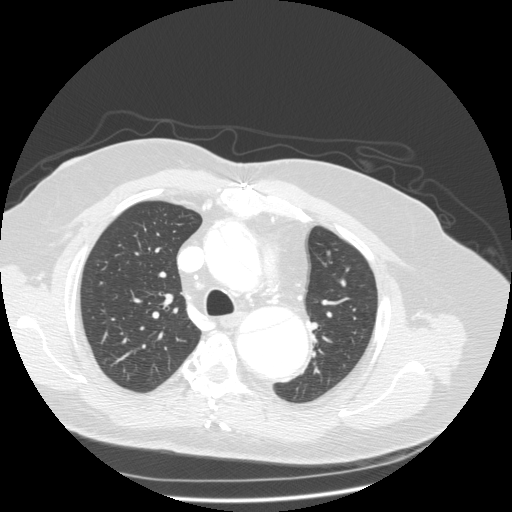
[im 874/1029  bone]
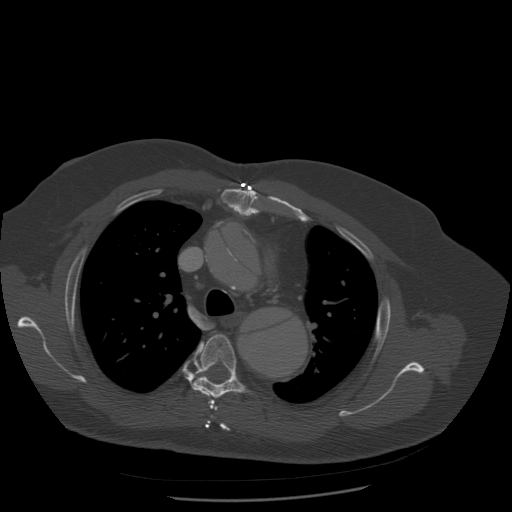
[im 926/1029  lung]
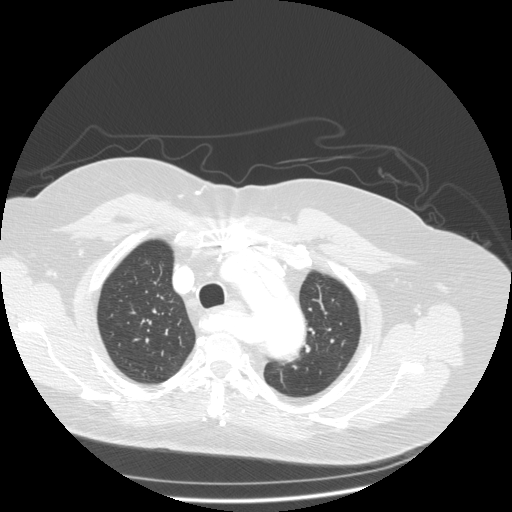
[im 977/1029  soft-tissue]
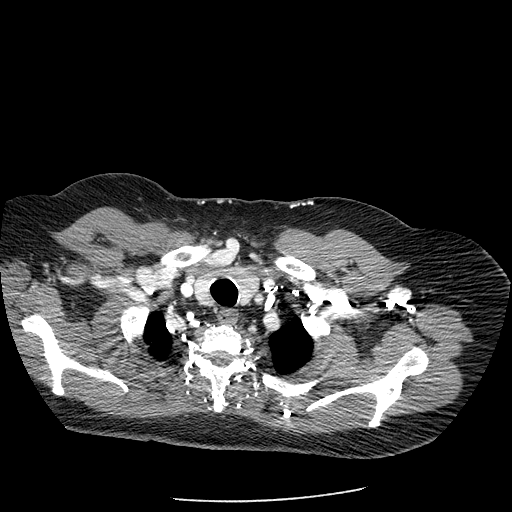
[im 977/1029  lung]
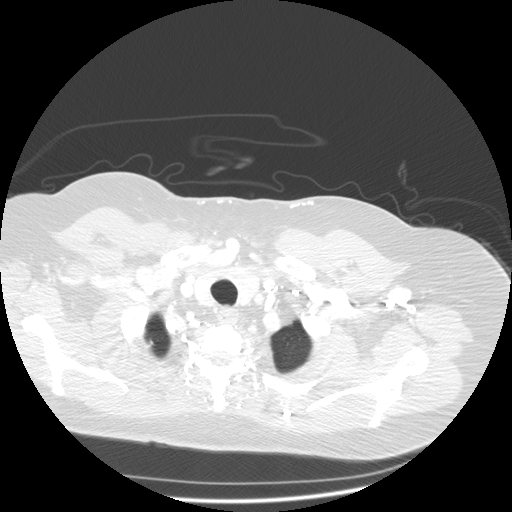

[Series 400: reformatted · sagittal · 1.27mm/px · 1 of 158 slices shown (1 of 2)]
[im 53/158  soft-tissue]
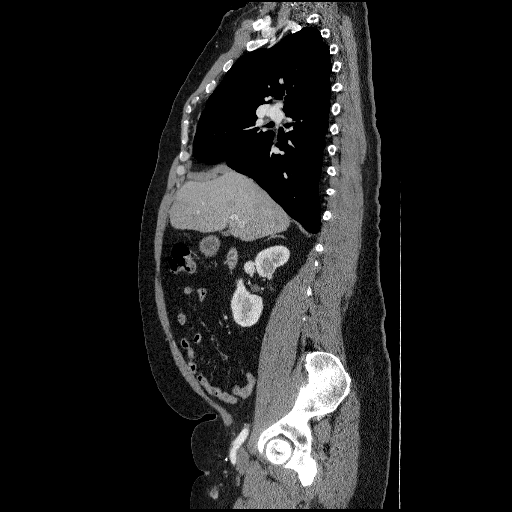

[Series 401: reformatted · coronal · 1.27mm/px · 3 of 142 slices shown (2 of 2)]
[im 48/142  soft-tissue]
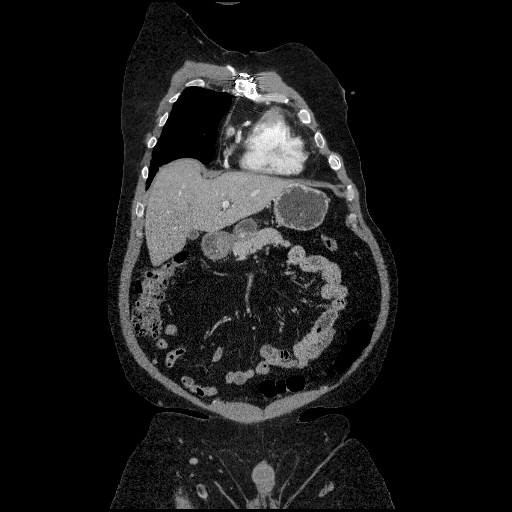
[im 63/142  soft-tissue]
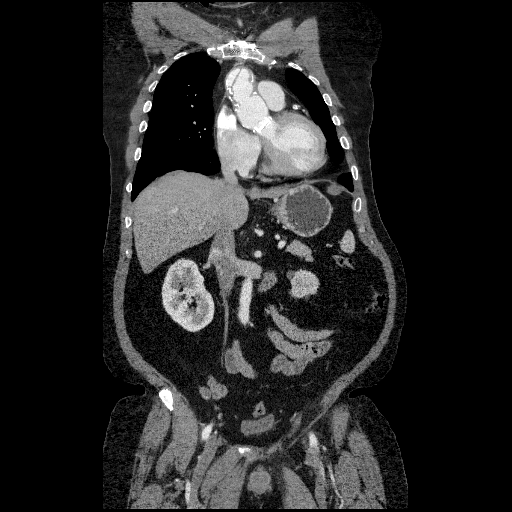
[im 79/142  soft-tissue]
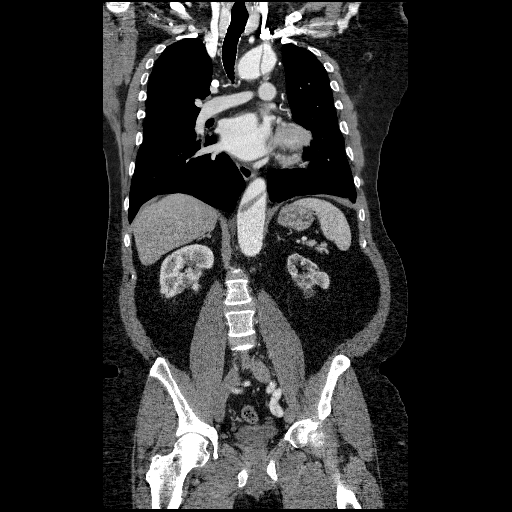

[16 of 46 positions shown; findings below may reference images not displayed]

Canned report from images found in remote index.

Refer to host system for actual result text.

## 2015-06-15 ENCOUNTER — Other Ambulatory Visit: Payer: Self-pay | Admitting: Internal Medicine

## 2015-06-17 ENCOUNTER — Other Ambulatory Visit: Payer: BLUE CROSS/BLUE SHIELD

## 2015-06-17 ENCOUNTER — Ambulatory Visit: Payer: BLUE CROSS/BLUE SHIELD

## 2015-06-17 ENCOUNTER — Ambulatory Visit (HOSPITAL_BASED_OUTPATIENT_CLINIC_OR_DEPARTMENT_OTHER): Payer: BLUE CROSS/BLUE SHIELD

## 2015-06-17 ENCOUNTER — Other Ambulatory Visit (HOSPITAL_BASED_OUTPATIENT_CLINIC_OR_DEPARTMENT_OTHER): Payer: BLUE CROSS/BLUE SHIELD

## 2015-06-17 VITALS — BP 127/73 | HR 59 | Temp 98.6°F

## 2015-06-17 DIAGNOSIS — C469 Kaposi's sarcoma, unspecified: Secondary | ICD-10-CM

## 2015-06-17 DIAGNOSIS — C467 Kaposi's sarcoma of other sites: Secondary | ICD-10-CM

## 2015-06-17 DIAGNOSIS — Z5111 Encounter for antineoplastic chemotherapy: Secondary | ICD-10-CM

## 2015-06-17 LAB — COMPREHENSIVE METABOLIC PANEL (CC13)
ALBUMIN: 4.1 g/dL (ref 3.5–5.0)
ALK PHOS: 24 U/L — AB (ref 40–150)
ALT: 23 U/L (ref 0–55)
AST: 29 U/L (ref 5–34)
Anion Gap: 7 mEq/L (ref 3–11)
BILIRUBIN TOTAL: 0.58 mg/dL (ref 0.20–1.20)
BUN: 25.6 mg/dL (ref 7.0–26.0)
CO2: 23 mEq/L (ref 22–29)
CREATININE: 1.7 mg/dL — AB (ref 0.7–1.3)
Calcium: 9 mg/dL (ref 8.4–10.4)
Chloride: 110 mEq/L — ABNORMAL HIGH (ref 98–109)
EGFR: 51 mL/min/{1.73_m2} — AB (ref >90)
GLUCOSE: 123 mg/dL (ref 70–140)
Potassium: 4.3 mEq/L (ref 3.5–5.1)
SODIUM: 140 meq/L (ref 136–145)
TOTAL PROTEIN: 6.2 g/dL — AB (ref 6.4–8.3)

## 2015-06-17 LAB — CBC WITH DIFFERENTIAL/PLATELET
BASO%: 0.9 % (ref 0.0–2.0)
BASOS ABS: 0 10*3/uL (ref 0.0–0.1)
EOS%: 3.9 % (ref 0.0–7.0)
Eosinophils Absolute: 0.1 10*3/uL (ref 0.0–0.5)
HCT: 44.6 % (ref 38.4–49.9)
HEMOGLOBIN: 15.3 g/dL (ref 13.0–17.1)
LYMPH%: 29.2 % (ref 14.0–49.0)
MCH: 28.8 pg (ref 27.2–33.4)
MCHC: 34.3 g/dL (ref 32.0–36.0)
MCV: 83.9 fL (ref 79.3–98.0)
MONO#: 0.6 10*3/uL (ref 0.1–0.9)
MONO%: 14.9 % — AB (ref 0.0–14.0)
NEUT#: 1.9 10*3/uL (ref 1.5–6.5)
NEUT%: 51.1 % (ref 39.0–75.0)
Platelets: 171 10*3/uL (ref 140–400)
RBC: 5.31 10*6/uL (ref 4.20–5.82)
RDW: 14.1 % (ref 11.0–14.6)
WBC: 3.8 10*3/uL — ABNORMAL LOW (ref 4.0–10.3)
lymph#: 1.1 10*3/uL (ref 0.9–3.3)

## 2015-06-17 MED ORDER — DEXTROSE 5 % IV SOLN
INTRAVENOUS | Status: DC
  Administered 2015-06-17: 10:00:00 via INTRAVENOUS

## 2015-06-17 MED ORDER — DOXORUBICIN HCL LIPOSOMAL CHEMO INJECTION 2 MG/ML
32.0000 mg/m2 | Freq: Once | INTRAVENOUS | Status: AC
  Administered 2015-06-17: 70 mg via INTRAVENOUS
  Filled 2015-06-17: qty 25

## 2015-06-17 MED ORDER — SODIUM CHLORIDE 0.9 % IV SOLN
Freq: Once | INTRAVENOUS | Status: AC
  Administered 2015-06-17: 10:00:00 via INTRAVENOUS
  Filled 2015-06-17: qty 4

## 2015-06-17 NOTE — Progress Notes (Signed)
Ok to treat per Dr. Gudena. 

## 2015-06-17 NOTE — Patient Instructions (Signed)
[facility] Discharge Instructions for Patients Receiving Chemotherapy  Today you received the following chemotherapy agents :  Doxil.  To help prevent nausea and vomiting after your treatment, we encourage you to take your nausea medication as prescribed.   If you develop nausea and vomiting that is not controlled by your nausea medication, call the clinic.   BELOW ARE SYMPTOMS THAT SHOULD BE REPORTED IMMEDIATELY:  *FEVER GREATER THAN 100.5 F  *CHILLS WITH OR WITHOUT FEVER  NAUSEA AND VOMITING THAT IS NOT CONTROLLED WITH YOUR NAUSEA MEDICATION  *UNUSUAL SHORTNESS OF BREATH  *UNUSUAL BRUISING OR BLEEDING  TENDERNESS IN MOUTH AND THROAT WITH OR WITHOUT PRESENCE OF ULCERS  *URINARY PROBLEMS  *BOWEL PROBLEMS  UNUSUAL RASH Items with * indicate a potential emergency and should be followed up as soon as possible.  Feel free to call the clinic you have any questions or concerns. The clinic phone number is [phone].  Please show the CHEMO ALERT CARD at check-in to the Emergency Department and triage nurse.   

## 2015-06-30 ENCOUNTER — Ambulatory Visit (INDEPENDENT_AMBULATORY_CARE_PROVIDER_SITE_OTHER): Payer: BLUE CROSS/BLUE SHIELD | Admitting: General Practice

## 2015-06-30 DIAGNOSIS — Z952 Presence of prosthetic heart valve: Secondary | ICD-10-CM

## 2015-06-30 DIAGNOSIS — Z7901 Long term (current) use of anticoagulants: Secondary | ICD-10-CM

## 2015-06-30 DIAGNOSIS — Z954 Presence of other heart-valve replacement: Secondary | ICD-10-CM

## 2015-06-30 LAB — POCT INR: INR: 2.3

## 2015-06-30 NOTE — Progress Notes (Signed)
I have reviewed and agree with the plan. 

## 2015-06-30 NOTE — Progress Notes (Signed)
Pre visit review using our clinic review tool, if applicable. No additional management support is needed unless otherwise documented below in the visit note. 

## 2015-07-16 ENCOUNTER — Other Ambulatory Visit (HOSPITAL_BASED_OUTPATIENT_CLINIC_OR_DEPARTMENT_OTHER): Payer: BLUE CROSS/BLUE SHIELD

## 2015-07-16 ENCOUNTER — Telehealth: Payer: Self-pay | Admitting: Oncology

## 2015-07-16 ENCOUNTER — Ambulatory Visit (HOSPITAL_BASED_OUTPATIENT_CLINIC_OR_DEPARTMENT_OTHER): Payer: BLUE CROSS/BLUE SHIELD | Admitting: Oncology

## 2015-07-16 VITALS — BP 135/80 | HR 65 | Temp 97.7°F | Resp 16 | Wt 222.6 lb

## 2015-07-16 DIAGNOSIS — C469 Kaposi's sarcoma, unspecified: Secondary | ICD-10-CM

## 2015-07-16 LAB — CBC WITH DIFFERENTIAL/PLATELET
BASO%: 0.9 % (ref 0.0–2.0)
BASOS ABS: 0 10*3/uL (ref 0.0–0.1)
EOS ABS: 0.2 10*3/uL (ref 0.0–0.5)
EOS%: 3.9 % (ref 0.0–7.0)
HEMATOCRIT: 47.3 % (ref 38.4–49.9)
HGB: 15.6 g/dL (ref 13.0–17.1)
LYMPH#: 1.1 10*3/uL (ref 0.9–3.3)
LYMPH%: 25.7 % (ref 14.0–49.0)
MCH: 28.1 pg (ref 27.2–33.4)
MCHC: 32.9 g/dL (ref 32.0–36.0)
MCV: 85.2 fL (ref 79.3–98.0)
MONO#: 0.6 10*3/uL (ref 0.1–0.9)
MONO%: 14.8 % — ABNORMAL HIGH (ref 0.0–14.0)
NEUT#: 2.4 10*3/uL (ref 1.5–6.5)
NEUT%: 54.7 % (ref 39.0–75.0)
PLATELETS: 169 10*3/uL (ref 140–400)
RBC: 5.55 10*6/uL (ref 4.20–5.82)
RDW: 14 % (ref 11.0–14.6)
WBC: 4.4 10*3/uL (ref 4.0–10.3)

## 2015-07-16 LAB — COMPREHENSIVE METABOLIC PANEL
ALBUMIN: 4.1 g/dL (ref 3.5–5.0)
ALK PHOS: 29 U/L — AB (ref 40–150)
ALT: 30 U/L (ref 0–55)
ANION GAP: 7 meq/L (ref 3–11)
AST: 32 U/L (ref 5–34)
BILIRUBIN TOTAL: 0.56 mg/dL (ref 0.20–1.20)
BUN: 26.4 mg/dL — AB (ref 7.0–26.0)
CALCIUM: 9.4 mg/dL (ref 8.4–10.4)
CHLORIDE: 108 meq/L (ref 98–109)
CO2: 27 mEq/L (ref 22–29)
CREATININE: 1.7 mg/dL — AB (ref 0.7–1.3)
EGFR: 49 mL/min/{1.73_m2} — ABNORMAL LOW (ref >90)
Glucose: 107 mg/dl (ref 70–140)
Potassium: 4.6 mEq/L (ref 3.5–5.1)
Sodium: 141 mEq/L (ref 136–145)
Total Protein: 6.6 g/dL (ref 6.4–8.3)

## 2015-07-16 NOTE — Progress Notes (Signed)
Hematology and Oncology Follow Up Visit  Austin [name] [id number] 21-Sep-1955 59 y.o. 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 He presented with cutaneous lesions in his lower extremities. He is HIV negative.   Past therapy: Doxil chemotherapy given at 30 mg/m every 4 weeks the first cycle given at [facility] in June 2016. He is status post 6 cycles completed in November 2016.  Current therapy: Observation surveillance.  Interim History:  Austin [name] presents today for a follow-up visit. Since his last visit, he completed chemotherapy without any major complications.. He did have grade 1 nausea that was easily manageable. He did not report any diarrhea or any other GI toxicities. He did not report any dermatological toxicities such as erythema or induration at his palms or soles.   His Kaposi's lesions have nearly resolved at this time with complete scarring of these lesions in his lower extremities. Continues to function and a normal level without any further decline in his health. Has not reported any recent hospitalization or illnesses.  He does not report any headaches, blurry vision, syncope or seizures. He does not report any fevers, chills, sweats or weight loss. He does not report any chest pain, coaptation, orthopnea or leg edema. He does not report any cough, wheezing or shortness of breath. He does not report any nausea, vomiting, abdominal pain, hematochezia or. He does not report any frequency, urgency or hesitancy. He does not report any skeletal complaints. Remainder review of systems unremarkable.   Medications: I have reviewed the patient's current medications.  Current Outpatient Prescriptions  Medication Sig Dispense Refill  . Diclofenac Sodium 2 % SOLN Apply 1 pump twice daily. 112 g 3  . fenofibrate 160 MG tablet TAKE 1 TABLET DAILY 90 tablet 3   . ferrous sulfate 325 (65 FE) MG tablet Take 325 mg by mouth.    Marland Kitchen LORazepam (ATIVAN) 1 MG tablet Take 1 mg by mouth as needed.  0  . losartan (COZAAR) 50 MG tablet Take 1 tablet (50 mg total) by mouth daily. 90 tablet 3  . metoprolol (TOPROL XL) 200 MG 24 hr tablet Take 1 tablet (200 mg total) by mouth daily. 90 tablet 3  . Multiple Vitamin (MULTIVITAMIN) tablet Take 1 tablet by mouth daily.    . prochlorperazine (COMPAZINE) 10 MG tablet Take 10 mg by mouth as needed.     . rosuvastatin (CRESTOR) 40 MG tablet Take 1 tablet (40 mg total) by mouth daily. 90 tablet 3  . warfarin (COUMADIN) 5 MG tablet TAKE AS DIRECTED BY ANTICOAGULATION CLINIC 135 tablet 1   No current facility-administered medications for this visit.     Allergies:  Allergies  Allergen Reactions  . Celecoxib Itching and Other (See Comments)    Joint pain    Past Medical History, Surgical history, Social history, and Family History were reviewed and updated.   Physical Exam: Blood pressure 135/80, pulse 65, temperature 97.7 F (36.5 C), temperature source Oral, resp. rate 16, weight 222 lb 9 oz (100.954 kg), SpO2 99 %. ECOG: 0 General appearance: alert and cooperative not in any distress. Head: Normocephalic, without obvious abnormality no oral ulcers or lesions. Neck: no adenopathy Lymph nodes: Cervical, supraclavicular, and axillary nodes normal. Heart:regular rate and rhythm, S1, S2 normal, no murmur, click, rub or gallop Lung:chest clear, no wheezing, rales, normal symmetric air entry Abdomin: soft, non-tender, without masses or organomegaly no  shifting dullness or ascites. EXT: Scaly raised lower extremity lesions noted a left more than the right. Poorly scarred at this time.   Lab Results: Lab Results  Component Value Date   WBC 4.4 07/16/2015   HGB 15.6 07/16/2015   HCT 47.3 07/16/2015   MCV 85.2 07/16/2015   PLT 169 07/16/2015     Chemistry      Component Value Date/Time   NA 140 06/17/2015  0912   NA 140 09/23/2014 0955   K 4.3 06/17/2015 0912   K 4.5 09/23/2014 0955   CL 106 09/23/2014 0955   CO2 23 06/17/2015 0912   CO2 30 09/23/2014 0955   BUN 25.6 06/17/2015 0912   BUN 32* 09/23/2014 0955   CREATININE 1.7* 06/17/2015 0912   CREATININE 1.52* 09/23/2014 0955      Component Value Date/Time   CALCIUM 9.0 06/17/2015 0912   CALCIUM 9.5 09/23/2014 0955   ALKPHOS 24* 06/17/2015 0912   ALKPHOS 23* 09/23/2014 0955   AST 29 06/17/2015 0912   AST 30 09/23/2014 0955   ALT 23 06/17/2015 0912   ALT 25 09/23/2014 0955   BILITOT 0.58 06/17/2015 0912   BILITOT 0.7 09/23/2014 0955       Impression and Plan:   59 year old gentleman with the following issues:  1. 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 He was evaluated by Dr. Kendall Flack at [facility] in his HIV testing was negative. He felt that he might have sporadic case of Kaposi's sarcoma rather than HIV related case.   He completed 6 cycles of Doxil with near resolution of his Kaposi's lesions. Plan is to continue with observation and surveillance and reinstitute chemotherapy upon progression. I will refer him to [facility] sarcoma clinic for second opinion regarding this unusual diagnosis.   2. IV access: Peripheral veins used without any major complications.  3. Antiemetics: He is prescribed Compazine and Ativan. No issues at this time.  4. Cardiomyopathy prophylaxis: He did have an echocardiogram on 12/10/2014 done at [facility] and showed a preserved LV function of 55-60%. Future echocardiogram ordered by his cardiologist.  5. 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 He might benefit from referral to genetic clinics at [facility].  6.  Follow-up: Will be in 3 months.   [id number], MD 12/16/201610:01 AM

## 2015-07-16 NOTE — Telephone Encounter (Signed)
s.w. pt and advised on March 2017 appt.....pt ok and aware °

## 2015-07-23 ENCOUNTER — Encounter: Payer: Self-pay | Admitting: *Deleted

## 2015-07-23 NOTE — Progress Notes (Signed)
Spoke with stephanie, new patient coordinator @ [facility]. appt set up with dr Delfino Lovett riedel 07/29/15 @ 11:00. They have epic, so they can view all info. Colletta [place] will request path slides and imaging studies. Patient notified of appt and stated he would be in [place], gave him stephanie's direct number, so that he may re-schedule to a new day and time.note to dr Hazeline Junker desk.

## 2015-08-11 ENCOUNTER — Ambulatory Visit (INDEPENDENT_AMBULATORY_CARE_PROVIDER_SITE_OTHER): Payer: BLUE CROSS/BLUE SHIELD | Admitting: General Practice

## 2015-08-11 DIAGNOSIS — Z5181 Encounter for therapeutic drug level monitoring: Secondary | ICD-10-CM

## 2015-08-11 DIAGNOSIS — Z954 Presence of other heart-valve replacement: Secondary | ICD-10-CM

## 2015-08-11 DIAGNOSIS — Z952 Presence of prosthetic heart valve: Secondary | ICD-10-CM

## 2015-08-11 LAB — POCT INR: INR: 2.3

## 2015-08-11 NOTE — Progress Notes (Signed)
I have reviewed and agree with the plan. 

## 2015-08-11 NOTE — Progress Notes (Signed)
Pre visit review using our clinic review tool, if applicable. No additional management support is needed unless otherwise documented below in the visit note. 

## 2015-08-12 DIAGNOSIS — C460 Kaposi's sarcoma of skin: Secondary | ICD-10-CM | POA: Insufficient documentation

## 2015-09-22 ENCOUNTER — Ambulatory Visit (INDEPENDENT_AMBULATORY_CARE_PROVIDER_SITE_OTHER): Payer: BLUE CROSS/BLUE SHIELD | Admitting: General Practice

## 2015-09-22 DIAGNOSIS — Z7901 Long term (current) use of anticoagulants: Secondary | ICD-10-CM | POA: Diagnosis not present

## 2015-09-22 DIAGNOSIS — Z954 Presence of other heart-valve replacement: Secondary | ICD-10-CM | POA: Diagnosis not present

## 2015-09-22 DIAGNOSIS — Z5181 Encounter for therapeutic drug level monitoring: Secondary | ICD-10-CM | POA: Diagnosis not present

## 2015-09-22 DIAGNOSIS — Z952 Presence of prosthetic heart valve: Secondary | ICD-10-CM

## 2015-09-22 LAB — POCT INR: INR: 2.5

## 2015-09-22 NOTE — Progress Notes (Signed)
I have reviewed and agree with the plan. 

## 2015-09-22 NOTE — Progress Notes (Signed)
Pre visit review using our clinic review tool, if applicable. No additional management support is needed unless otherwise documented below in the visit note. 

## 2015-09-28 ENCOUNTER — Encounter: Payer: BLUE CROSS/BLUE SHIELD | Admitting: Internal Medicine

## 2015-10-04 ENCOUNTER — Other Ambulatory Visit (INDEPENDENT_AMBULATORY_CARE_PROVIDER_SITE_OTHER): Payer: BLUE CROSS/BLUE SHIELD

## 2015-10-04 ENCOUNTER — Ambulatory Visit (INDEPENDENT_AMBULATORY_CARE_PROVIDER_SITE_OTHER): Payer: BLUE CROSS/BLUE SHIELD | Admitting: Internal Medicine

## 2015-10-04 ENCOUNTER — Encounter: Payer: Self-pay | Admitting: Internal Medicine

## 2015-10-04 ENCOUNTER — Telehealth: Payer: Self-pay

## 2015-10-04 VITALS — BP 124/72 | HR 59 | Temp 98.7°F | Resp 20 | Wt 224.0 lb

## 2015-10-04 DIAGNOSIS — Z0000 Encounter for general adult medical examination without abnormal findings: Secondary | ICD-10-CM

## 2015-10-04 DIAGNOSIS — R739 Hyperglycemia, unspecified: Secondary | ICD-10-CM | POA: Insufficient documentation

## 2015-10-04 DIAGNOSIS — I10 Essential (primary) hypertension: Secondary | ICD-10-CM

## 2015-10-04 DIAGNOSIS — N183 Chronic kidney disease, stage 3 (moderate): Secondary | ICD-10-CM

## 2015-10-04 DIAGNOSIS — E119 Type 2 diabetes mellitus without complications: Secondary | ICD-10-CM | POA: Insufficient documentation

## 2015-10-04 LAB — URINALYSIS, ROUTINE W REFLEX MICROSCOPIC
Bilirubin Urine: NEGATIVE
Hgb urine dipstick: NEGATIVE
Ketones, ur: NEGATIVE
Leukocytes, UA: NEGATIVE
Nitrite: NEGATIVE
RBC / HPF: NONE SEEN (ref 0–?)
SPECIFIC GRAVITY, URINE: 1.015 (ref 1.000–1.030)
URINE GLUCOSE: NEGATIVE
Urobilinogen, UA: 0.2 (ref 0.0–1.0)
pH: 7 (ref 5.0–8.0)

## 2015-10-04 LAB — HEPATIC FUNCTION PANEL
ALK PHOS: 23 U/L — AB (ref 39–117)
ALT: 27 U/L (ref 0–53)
AST: 27 U/L (ref 0–37)
Albumin: 4.8 g/dL (ref 3.5–5.2)
BILIRUBIN TOTAL: 0.7 mg/dL (ref 0.2–1.2)
Bilirubin, Direct: 0.1 mg/dL (ref 0.0–0.3)
Total Protein: 6.6 g/dL (ref 6.0–8.3)

## 2015-10-04 LAB — BASIC METABOLIC PANEL
BUN: 29 mg/dL — ABNORMAL HIGH (ref 6–23)
CO2: 29 mEq/L (ref 19–32)
Calcium: 10.1 mg/dL (ref 8.4–10.5)
Chloride: 108 mEq/L (ref 96–112)
Creatinine, Ser: 1.6 mg/dL — ABNORMAL HIGH (ref 0.40–1.50)
GFR: 56.9 mL/min — AB (ref >60.00)
GLUCOSE: 106 mg/dL — AB (ref 70–99)
POTASSIUM: 4.8 meq/L (ref 3.5–5.1)
SODIUM: 142 meq/L (ref 135–145)

## 2015-10-04 LAB — LIPID PANEL
CHOL/HDL RATIO: 4
CHOLESTEROL: 165 mg/dL (ref 0–200)
HDL: 41.5 mg/dL (ref >39.00)
LDL CALC: 92 mg/dL (ref 0–99)
NonHDL: 123.31
Triglycerides: 158 mg/dL — ABNORMAL HIGH (ref 0.0–149.0)
VLDL: 31.6 mg/dL (ref 0.0–40.0)

## 2015-10-04 LAB — CBC WITH DIFFERENTIAL/PLATELET
BASOS PCT: 0.8 % (ref 0.0–3.0)
Basophils Absolute: 0 10*3/uL (ref 0.0–0.1)
EOS PCT: 2.8 % (ref 0.0–5.0)
Eosinophils Absolute: 0.1 10*3/uL (ref 0.0–0.7)
HCT: 46.1 % (ref 39.0–52.0)
Hemoglobin: 15.7 g/dL (ref 13.0–17.0)
LYMPHS ABS: 1.3 10*3/uL (ref 0.7–4.0)
Lymphocytes Relative: 26.3 % (ref 12.0–46.0)
MCHC: 34 g/dL (ref 30.0–36.0)
MCV: 83.3 fl (ref 78.0–100.0)
MONO ABS: 0.5 10*3/uL (ref 0.1–1.0)
Monocytes Relative: 11.3 % (ref 3.0–12.0)
NEUTROS ABS: 2.8 10*3/uL (ref 1.4–7.7)
NEUTROS PCT: 58.8 % (ref 43.0–77.0)
Platelets: 181 10*3/uL (ref 150.0–400.0)
RBC: 5.54 Mil/uL (ref 4.22–5.81)
RDW: 13.4 % (ref 11.5–15.5)
WBC: 4.8 10*3/uL (ref 4.0–10.5)

## 2015-10-04 LAB — HEMOGLOBIN A1C: Hgb A1c MFr Bld: 5.9 % (ref 4.6–6.5)

## 2015-10-04 LAB — PSA: PSA: 1.86 ng/mL (ref 0.10–4.00)

## 2015-10-04 LAB — TSH: TSH: 1.83 u[IU]/mL (ref 0.35–4.50)

## 2015-10-04 MED ORDER — FERROUS SULFATE 325 (65 FE) MG PO TABS: 325.0000 mg | ORAL_TABLET | Freq: Every day | ORAL | Status: DC

## 2015-10-04 MED ORDER — ROSUVASTATIN CALCIUM 40 MG PO TABS
40.0000 mg | ORAL_TABLET | Freq: Every day | ORAL | Status: DC
Start: 2015-10-04 — End: 2016-10-05

## 2015-10-04 MED ORDER — LOSARTAN POTASSIUM 50 MG PO TABS: 50.0000 mg | ORAL_TABLET | Freq: Every day | ORAL | Status: DC

## 2015-10-04 MED ORDER — FENOFIBRATE 160 MG PO TABS: 160.0000 mg | ORAL_TABLET | Freq: Every day | ORAL | Status: DC

## 2015-10-04 MED ORDER — METOPROLOL SUCCINATE ER 200 MG PO TB24: 200.0000 mg | ORAL_TABLET | Freq: Every day | ORAL | Status: DC

## 2015-10-04 NOTE — Assessment & Plan Note (Signed)
stable overall by history and exam, recent data reviewed with pt, and pt to continue medical treatment as before,  to f/u any worsening symptoms or concerns BP Readings from Last 3 Encounters:  10/04/15 124/72  07/16/15 135/80  06/17/15 127/73

## 2015-10-04 NOTE — Assessment & Plan Note (Signed)
Mild recent, asympt, stable overall by history and exam, recent data reviewed with pt, and pt to continue medical treatment as before,  to f/u any worsening symptoms or concerns, for f/u a1c to r/o DM new onset, to cont work on diet and wt loss

## 2015-10-04 NOTE — Patient Instructions (Addendum)
Your EKG was OK today  Please continue all other medications as before, and refills have been done if requested.  Please have the pharmacy call with any other refills you may need.  Please continue your efforts at being more active, low cholesterol diet, and weight control.  You are otherwise up to date with prevention measures today.  Please keep your appointments with your specialists as you may have planned  Please go to the LAB in the Basement (turn left off the elevator) for the tests to be done today  You will be contacted by phone if any changes need to be made immediately.  Otherwise, you will receive a letter about your results with an explanation, but please check with MyChart first.  Please remember to sign up for MyChart if you have not done so, as this will be important to you in the future with finding out test results, communicating by private email, and scheduling acute appointments online when needed.  Please return in 1 year for your yearly visit, or sooner if needed, with Lab testing done 3-5 days before  

## 2015-10-04 NOTE — Assessment & Plan Note (Signed)
stable overall by history and exam, recent data reviewed with pt, and pt to continue medical treatment as before,  to f/u any worsening symptoms or concerns Lab Results  Component Value Date   CREATININE 1.7* 07/16/2015   To cont f/u with nephrology, o/w stable

## 2015-10-04 NOTE — Progress Notes (Signed)
Subjective:    Patient ID: Austin [name], male    DOB: [birth year], 60 y.o.   MRN: [id number]  HPI  Here for wellness and f/u;  Overall doing ok;  Pt denies Chest pain, worsening SOB, DOE, wheezing, orthopnea, PND, worsening LE edema, palpitations, dizziness or syncope.  Pt denies neurological change such as new headache, facial or extremity weakness.  Pt denies polydipsia, polyuria, or low sugar symptoms. Pt states overall good compliance with treatment and medications, good tolerability, and has been trying to follow appropriate diet.  Pt denies worsening depressive symptoms, suicidal ideation or panic. No fever, night sweats, wt loss, loss of appetite, or other constitutional symptoms.  Pt states good ability with ADL's, has low fall risk, home safety reviewed and adequate, no other significant changes in hearing or vision, and only occasionally active with exercise.  Has had several mild elevated blood sugars recenlty during chemo for kaposi's,  Pt denies polydipsia, polyuria, or low sugar symptoms such as weakness  Pt states overall good compliance with meds, trying to follow lower cholesterol diet, wt overall stable but little exercise however.   Is going on sabbatical soon for 4 mo, plans to be more active then, now that chemo is finished Past Medical History  Diagnosis Date  . Coronary artery disease   . Hypertension   . Hyperlipidemia   . Obesity   . Internal hemorrhoid   . Pericardial effusion   . Thyroid disease   . Back pain   . Anemia     iron def  . Thoracic aortic aneurysm [facility])     a. s/p dissection and repair @ [facility] 2003 with SJM AVR;  CTA 09/2010 desc thor Ao 5.2 x 5.3-5.5 x 5.5 cm.  . Renal insufficiency 05/16/2011  . Aortic valve prosthesis present     a. SJM - [facility] 2003, chronic coumadin.   Past Surgical History  Procedure Laterality Date  . Impingment      right shoulder  . Abdominal aortic aneurysm repair    . Aortic valve replacement    . Knee arthroscopy     left  . Wisdom tooth extraction      reports that he quit smoking about 13 years ago. His smoking use included Cigarettes. He has a 15 pack-year smoking history. He has never used smokeless tobacco. He reports that he drinks alcohol. He reports that he does not use illicit drugs. family history includes Liver cancer in his father. There is no history of Colon cancer. Allergies  Allergen Reactions  . Celecoxib Itching and Other (See Comments)    Joint pain   Current Outpatient Prescriptions on File Prior to Visit  Medication Sig Dispense Refill  . Diclofenac Sodium 2 % SOLN Apply 1 pump twice daily. 112 g 3  . fenofibrate 160 MG tablet TAKE 1 TABLET DAILY 90 tablet 3  . ferrous sulfate 325 (65 FE) MG tablet Take 325 mg by mouth.    . losartan (COZAAR) 50 MG tablet Take 1 tablet (50 mg total) by mouth daily. 90 tablet 3  . metoprolol (TOPROL XL) 200 MG 24 hr tablet Take 1 tablet (200 mg total) by mouth daily. 90 tablet 3  . Multiple Vitamin (MULTIVITAMIN) tablet Take 1 tablet by mouth daily.    . rosuvastatin (CRESTOR) 40 MG tablet Take 1 tablet (40 mg total) by mouth daily. 90 tablet 3  . warfarin (COUMADIN) 5 MG tablet TAKE AS DIRECTED BY ANTICOAGULATION CLINIC 135 tablet 1   No  current facility-administered medications on file prior to visit.   Review of Systems Constitutional: Negative for increased diaphoresis, other activity, appetite or siginficant weight change other than noted HENT: Negative for worsening hearing loss, ear pain, facial swelling, mouth sores and neck stiffness.   Eyes: Negative for other worsening pain, redness or visual disturbance.  Respiratory: Negative for shortness of breath and wheezing  Cardiovascular: Negative for chest pain and palpitations.  Gastrointestinal: Negative for diarrhea, blood in stool, abdominal distention or other pain Genitourinary: Negative for hematuria, flank pain or change in urine volume.  Musculoskeletal: Negative for myalgias or  other joint complaints.  Skin: Negative for color change and wound or drainage.  Neurological: Negative for syncope and numbness. other than noted Hematological: Negative for adenopathy. or other swelling Psychiatric/Behavioral: Negative for hallucinations, SI, self-injury, decreased concentration or other worsening agitation.      Objective:   Physical Exam BP 124/72 mmHg  Pulse 59  Temp(Src) 98.7 F (37.1 C) (Oral)  Resp 20  Wt 224 lb (101.606 kg)  SpO2 98% VS noted,  Constitutional: Pt is oriented to person, place, and time. Appears well-developed and well-nourished, in no significant distress Head: Normocephalic and atraumatic.  Right Ear: External ear normal.  Left Ear: External ear normal.  Nose: Nose normal.  Mouth/Throat: Oropharynx is clear and moist.  Eyes: Conjunctivae and EOM are normal. Pupils are equal, round, and reactive to light.  Neck: Normal range of motion. Neck supple. No JVD present. No tracheal deviation present or significant neck LA or mass Cardiovascular: Normal rate, regular rhythm, normal heart sounds and intact distal pulses.   Pulmonary/Chest: Effort normal and breath sounds without rales or wheezing  Abdominal: Soft. Bowel sounds are normal. NT. No HSM  Musculoskeletal: Normal range of motion. Exhibits no edema.  Lymphadenopathy:  Has no cervical adenopathy.  Neurological: Pt is alert and oriented to person, place, and time. Pt has normal reflexes. No cranial nerve deficit. Motor grossly intact Skin: Skin is warm and dry. No rash noted. No new skin lesions noted Psychiatric:  Has normal mood and affect. Behavior is normal.     Assessment & Plan:

## 2015-10-04 NOTE — Progress Notes (Signed)
Pre visit review using our clinic review tool, if applicable. No additional management support is needed unless otherwise documented below in the visit note. 

## 2015-10-04 NOTE — Assessment & Plan Note (Signed)

## 2015-10-04 NOTE — Telephone Encounter (Signed)
Refilled medications

## 2015-10-22 ENCOUNTER — Ambulatory Visit (HOSPITAL_BASED_OUTPATIENT_CLINIC_OR_DEPARTMENT_OTHER): Payer: BLUE CROSS/BLUE SHIELD | Admitting: Oncology

## 2015-10-22 ENCOUNTER — Telehealth: Payer: Self-pay | Admitting: Oncology

## 2015-10-22 ENCOUNTER — Other Ambulatory Visit (HOSPITAL_BASED_OUTPATIENT_CLINIC_OR_DEPARTMENT_OTHER): Payer: BLUE CROSS/BLUE SHIELD

## 2015-10-22 VITALS — BP 141/77 | HR 66 | Temp 97.7°F | Resp 20 | Ht 68.0 in | Wt 225.8 lb

## 2015-10-22 DIAGNOSIS — C469 Kaposi's sarcoma, unspecified: Secondary | ICD-10-CM

## 2015-10-22 LAB — COMPREHENSIVE METABOLIC PANEL
ALBUMIN: 4.1 g/dL (ref 3.5–5.0)
ALK PHOS: 25 U/L — AB (ref 40–150)
ALT: 29 U/L (ref 0–55)
ANION GAP: 7 meq/L (ref 3–11)
AST: 31 U/L (ref 5–34)
BILIRUBIN TOTAL: 0.75 mg/dL (ref 0.20–1.20)
BUN: 29.4 mg/dL — AB (ref 7.0–26.0)
CALCIUM: 9.2 mg/dL (ref 8.4–10.4)
CHLORIDE: 111 meq/L — AB (ref 98–109)
CO2: 25 mEq/L (ref 22–29)
CREATININE: 2 mg/dL — AB (ref 0.7–1.3)
EGFR: 41 mL/min/{1.73_m2} — ABNORMAL LOW (ref >90)
Glucose: 114 mg/dl (ref 70–140)
Potassium: 4.5 mEq/L (ref 3.5–5.1)
Sodium: 143 mEq/L (ref 136–145)
TOTAL PROTEIN: 6.5 g/dL (ref 6.4–8.3)

## 2015-10-22 LAB — CBC WITH DIFFERENTIAL/PLATELET
BASO%: 0.9 % (ref 0.0–2.0)
Basophils Absolute: 0 10*3/uL (ref 0.0–0.1)
EOS%: 3.5 % (ref 0.0–7.0)
Eosinophils Absolute: 0.1 10*3/uL (ref 0.0–0.5)
HEMATOCRIT: 44.6 % (ref 38.4–49.9)
HEMOGLOBIN: 15 g/dL (ref 13.0–17.1)
LYMPH#: 1 10*3/uL (ref 0.9–3.3)
LYMPH%: 28.6 % (ref 14.0–49.0)
MCH: 28.1 pg (ref 27.2–33.4)
MCHC: 33.7 g/dL (ref 32.0–36.0)
MCV: 83.2 fL (ref 79.3–98.0)
MONO#: 0.4 10*3/uL (ref 0.1–0.9)
MONO%: 10.3 % (ref 0.0–14.0)
NEUT%: 56.7 % (ref 39.0–75.0)
NEUTROS ABS: 2 10*3/uL (ref 1.5–6.5)
PLATELETS: 139 10*3/uL — AB (ref 140–400)
RBC: 5.36 10*6/uL (ref 4.20–5.82)
RDW: 13.6 % (ref 11.0–14.6)
WBC: 3.5 10*3/uL — ABNORMAL LOW (ref 4.0–10.3)

## 2015-10-22 NOTE — Progress Notes (Signed)
Hematology and Oncology Follow Up Visit  Austin [name] [id number] [birth year] 60 y.o. 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 He presented with cutaneous lesions in his lower extremities. He is HIV negative.   Past therapy: Doxil chemotherapy given at 30 mg/m every 4 weeks the first cycle given at [facility] in June 2016. He is status post 6 cycles completed in November 2016.  Current therapy: Observation surveillance.  Interim History:  [name] presents today for a follow-up visit. Since his last visit, he reports no recent complaints. He reports no new skin lesions or delayed complications related to chemotherapy. He denied any respiratory symptoms such as cough or hemoptysis. He denied any pruritus or erythema. He was evaluated at [facility] sarcoma clinic for a second opinion.   He does not report any headaches, blurry vision, syncope or seizures. He does not report any fevers, chills, sweats or weight loss. He does not report any chest pain, coaptation, orthopnea or leg edema. He does not report any cough, wheezing or shortness of breath. He does not report any nausea, vomiting, abdominal pain, hematochezia or. He does not report any frequency, urgency or hesitancy. He does not report any skeletal complaints. Remainder review of systems unremarkable.   Medications: I have reviewed the patient's current medications.  Current Outpatient Prescriptions  Medication Sig Dispense Refill  . Diclofenac Sodium 2 % SOLN Apply 1 pump twice daily. 112 g 3  . fenofibrate 160 MG tablet Take 1 tablet (160 mg total) by mouth daily. 90 tablet 3  . ferrous sulfate 325 (65 FE) MG tablet Take 1 tablet (325 mg total) by mouth daily with breakfast. 30 tablet 3  . losartan (COZAAR) 50 MG tablet Take 1 tablet (50 mg total) by mouth daily. 90 tablet 3  .  metoprolol (TOPROL XL) 200 MG 24 hr tablet Take 1 tablet (200 mg total) by mouth daily. 90 tablet 3  . Multiple Vitamin (MULTIVITAMIN) tablet Take 1 tablet by mouth daily.    . rosuvastatin (CRESTOR) 40 MG tablet Take 1 tablet (40 mg total) by mouth daily. 90 tablet 3  . warfarin (COUMADIN) 5 MG tablet TAKE AS DIRECTED BY ANTICOAGULATION CLINIC 135 tablet 1   No current facility-administered medications for this visit.     Allergies:  Allergies  Allergen Reactions  . Celecoxib Itching and Other (See Comments)    Joint pain    Past Medical History, Surgical history, Social history, and Family History were reviewed and updated.   Physical Exam: Blood pressure 141/77, pulse 66, temperature 97.7 F (36.5 C), temperature source Oral, resp. rate 20, height 5\' 8"  (1.727 m), weight 225 lb 12.8 oz (102.422 kg), SpO2 98 %. ECOG: 0 General appearance: alert and cooperative appeared without distress. Head: Normocephalic, without obvious abnormality no oral thrush. Neck: no adenopathy Lymph nodes: Cervical, supraclavicular, and axillary nodes normal. Heart:regular rate and rhythm, S1, S2 normal, no murmur, click, rub or gallop Lung:chest clear, no wheezing, rales, normal symmetric air entry Abdomin: soft, non-tender, without masses or organomegaly no shifting dullness or ascites. EXT: Scaly raised lower extremity lesions noted a left more than the right. It appears well-healed.   Lab Results: Lab Results  Component Value Date   WBC 3.5* 10/22/2015   HGB 15.0 10/22/2015   HCT 44.6 10/22/2015   MCV 83.2 10/22/2015   PLT 139* 10/22/2015  Chemistry      Component Value Date/Time   NA 142 10/04/2015 1140   NA 141 07/16/2015 0936   K 4.8 10/04/2015 1140   K 4.6 07/16/2015 0936   CL 108 10/04/2015 1140   CO2 29 10/04/2015 1140   CO2 27 07/16/2015 0936   BUN 29* 10/04/2015 1140   BUN 26.4* 07/16/2015 0936   CREATININE 1.60* 10/04/2015 1140   CREATININE 1.7* 07/16/2015 0936       Component Value Date/Time   CALCIUM 10.1 10/04/2015 1140   CALCIUM 9.4 07/16/2015 0936   ALKPHOS 23* 10/04/2015 1140   ALKPHOS 29* 07/16/2015 0936   AST 27 10/04/2015 1140   AST 32 07/16/2015 0936   ALT 27 10/04/2015 1140   ALT 30 07/16/2015 0936   BILITOT 0.7 10/04/2015 1140   BILITOT 0.56 07/16/2015 0936       Impression and Plan:   60 year old gentleman with the following issues:  1. 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 He was evaluated by Dr. Kendall Flack at [facility] in his HIV testing was negative. He felt that he might have sporadic case of Kaposi's sarcoma rather than HIV related case.   He completed 6 cycles of Doxil with near resolution of his Kaposi's lesions. Plan is to continue with observation and surveillance and reinstitute chemotherapy upon progression.Focal radiation therapy can be used as well for localized lesions.  He will continue follow-up every 6 months and repeat chest x-ray periodically.   2. Cardiomyopathy prophylaxis: He did have an echocardiogram on 12/10/2014 done at [facility] and showed a preserved LV function of 55-60%. Future echocardiogram ordered by his cardiologist.  3. 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 I discussed with him a referral to genetic counselor and he will consider that option at this time.  4. Follow-up: Will be in 6 months.    [facility], MD 3/24/20179:07 AM

## 2015-10-22 NOTE — Telephone Encounter (Signed)
per pof to sch pt appt-gave pt copy of avs °

## 2015-11-03 ENCOUNTER — Ambulatory Visit (INDEPENDENT_AMBULATORY_CARE_PROVIDER_SITE_OTHER): Payer: BLUE CROSS/BLUE SHIELD | Admitting: General Practice

## 2015-11-03 ENCOUNTER — Telehealth: Payer: Self-pay | Admitting: Internal Medicine

## 2015-11-03 DIAGNOSIS — Z5181 Encounter for therapeutic drug level monitoring: Secondary | ICD-10-CM

## 2015-11-03 DIAGNOSIS — Z954 Presence of other heart-valve replacement: Secondary | ICD-10-CM | POA: Diagnosis not present

## 2015-11-03 DIAGNOSIS — Z7901 Long term (current) use of anticoagulants: Secondary | ICD-10-CM

## 2015-11-03 DIAGNOSIS — Z952 Presence of prosthetic heart valve: Secondary | ICD-10-CM

## 2015-11-03 LAB — POCT INR: INR: 2.9

## 2015-11-03 NOTE — Progress Notes (Signed)
Pre visit review using our clinic review tool, if applicable. No additional management support is needed unless otherwise documented below in the visit note. 

## 2015-11-03 NOTE — Telephone Encounter (Signed)
Patient states that there is now a generic of   rosuvastatin (CRESTOR) 40 MG tablet [id number]     . He states that [facility] will not allow him to change the script himself. He is asking [place] to send in another script so that he can fill generic.

## 2015-11-03 NOTE — Progress Notes (Signed)
Chief Complaint  Patient presents with  . Follow-up     no complaints      History of Present Illness: 60 yo [name] with history of HTN, hyperlipidemia and ascending aortic aneursym dissection with repair at [facility] in 2003 with St. 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 His surgery was at [facility] in 2003. He has had a residual thoracic and abdominal aortic dissection since then. Imaging showed large dissection extending from ascending aorta down into the abdominal aorta and iliacs. The false lumen in the descending thoracic aortic aneurysm is larger than the true lumen. There is some involvement of the arch vessels and renal arteries. He was also found to have a right lung nodule and lesions in the liver and kidneys that were hard to characterize. Echo 10/28/12 with normal LV function, normal functioning AVR. He is on chronic coumadin for anticoagulation after mechanical AVR. 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 Repeat CTA chest and abdomen May 2016 at [facility] and stable. Echo May 2016 at [facility] with normal LV systolic function. 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 He has been seen in [facility], [facility] and is followed in [place]. He is HIV negative. He has completed chemotherapy.    He is here today for follow up. No chest pain or SOB. No other complaints. No lower extremity edema or pain.   Primary Care Physician: Dr. Cathlean Cower   Past Medical History  Diagnosis Date  . Coronary artery disease   . Hypertension   . Hyperlipidemia   . Obesity   . Internal hemorrhoid   . Pericardial effusion   . Thyroid disease   . Back pain   . Anemia     iron def  . Thoracic aortic aneurysm [facility])     a. s/p dissection and repair @ [facility] 2003 with SJM AVR;  CTA 09/2010 desc thor Ao 5.2 x 5.3-5.5 x 5.5 cm.  . Renal insufficiency 05/16/2011  .  Aortic valve prosthesis present     a. SJM - [facility] 2003, chronic coumadin.    Past Surgical History  Procedure Laterality Date  . Impingment      right shoulder  . Abdominal aortic aneurysm repair    . Aortic valve replacement    . Knee arthroscopy      left  . Wisdom tooth extraction      Current Outpatient Prescriptions  Medication Sig Dispense Refill  . Diclofenac Sodium 2 % SOLN Apply 1 pump twice daily. 112 g 3  . fenofibrate 160 MG tablet Take 1 tablet (160 mg total) by mouth daily. 90 tablet 3  . ferrous sulfate 325 (65 FE) MG tablet Take 1 tablet (325 mg total) by mouth daily with breakfast. 30 tablet 3  . losartan (COZAAR) 50 MG tablet Take 1 tablet (50 mg total) by mouth daily. 90 tablet 3  . metoprolol (TOPROL XL) 200 MG 24 hr tablet Take 1 tablet (200 mg total) by mouth daily. 90 tablet 3  . Multiple Vitamin (MULTIVITAMIN) tablet Take 1 tablet by mouth daily.    . rosuvastatin (CRESTOR) 40 MG tablet Take 1 tablet (40 mg total) by mouth daily. 90 tablet 3  . warfarin (COUMADIN) 5 MG tablet TAKE AS DIRECTED BY ANTICOAGULATION CLINIC 135 tablet 1   No current facility-administered medications for this visit.    Allergies  Allergen  Reactions  . Celecoxib Itching and Other (See Comments)    Joint pain    Social History   Social History  . Marital Status: Single    Spouse Name: N/A  . Number of Children: 0  . Years of Education: MDIV   Occupational History  . [employer]   Social History Main Topics  . Smoking status: Former Smoker -- 1.00 packs/day for 15 years    Types: Cigarettes    Quit date: 10/10/2001  . Smokeless tobacco: Never Used  . Alcohol Use: Yes     Comment: one drink per day  . Drug Use: No  . Sexual Activity: Not Currently   Other Topics Concern  . Not on file   Social History Narrative   Pt lives at home alone.   Caffeine Use: Less than 1 cup daily.    Family History  Problem Relation Age of Onset  . Liver cancer  Father   . Colon cancer Neg Hx     Review of Systems:  As stated in the HPI and otherwise negative.   BP 92/68 mmHg  Pulse 64  Ht 5\' 8"  (1.727 m)  Wt 226 lb 12.8 oz (102.876 kg)  BMI 34.49 kg/m2  SpO2 98%  Physical Examination: General: Well developed, well nourished, NAD SKIN: warm, dry. No rashes. Neuro: No focal deficits Musculoskeletal: Muscle strength 5/5 all ext Psychiatric: Mood and affect normal Neck: No JVD, loud sounds bilateral carotids, no thyromegaly, no lymphadenopathy. Lungs:Clear bilaterally, no wheezes, rhonci, crackles Cardiovascular: Regular rate and rhythm. Valve click with systolic murmur. No gallops or rubs. Abdomen:Soft. Bowel sounds present. Non-tender.  Extremities: No lower extremity edema. Pulses are 2 + in the bilateral DP/PT.   EKG:  EKG is not ordered today. The ekg ordered today demonstrates   Recent Labs: 10/04/2015: TSH 1.83 10/22/2015: ALT 29; BUN 29.4*; Creatinine 2.0*; HGB 15.0; Platelets 139*; Potassium 4.5; Sodium 143   Lipid Panel    Component Value Date/Time   CHOL 165 10/04/2015 1140   TRIG 158.0* 10/04/2015 1140   HDL 41.50 10/04/2015 1140   CHOLHDL 4 10/04/2015 1140   VLDL 31.6 10/04/2015 1140   LDLCALC 92 10/04/2015 1140   LDLDIRECT 114.0 09/23/2014 0955     Wt Readings from Last 3 Encounters:  11/04/15 226 lb 12.8 oz (102.876 kg)  10/22/15 225 lb 12.8 oz (102.422 kg)  10/04/15 224 lb (101.606 kg)     Other studies Reviewed: Additional studies/ records that were reviewed today include: . Review of the above records demonstrates:    Assessment and Plan:   1. THORACIC AORTIC ANEURYSM: He is known to have type A aortic dissection with extension from the thoracic aorta through the iliacs involving the right renal artery. This has been stable. Last CTA was May 2016 at [facility] and stable. I think we can continue serial scans but every other year given stability for year.   2. S/P aortic valve replacement:  Mechanical AVR in place. On chronic coumadin therapy. Valve functioning well by echo 2016.   Current medicines are reviewed at length with the patient today.  The patient does not have concerns regarding medicines.  The following changes have been made:  no change  Labs/ tests ordered today include:  No orders of the defined types were placed in this encounter.     Disposition:   FU with me in 12  months   Signed, Lauree Chandler, MD 11/04/2015 10:08 AM    [facility] [address], [place], [place]  [address] Phone: [phone]; Fax: [phone]

## 2015-11-03 NOTE — Progress Notes (Signed)
I have reviewed and agree with the plan. 

## 2015-11-04 ENCOUNTER — Encounter: Payer: Self-pay | Admitting: Cardiovascular Disease

## 2015-11-04 ENCOUNTER — Ambulatory Visit (INDEPENDENT_AMBULATORY_CARE_PROVIDER_SITE_OTHER): Payer: BLUE CROSS/BLUE SHIELD | Admitting: Cardiovascular Disease

## 2015-11-04 VITALS — BP 92/68 | HR 64 | Ht 68.0 in | Wt 226.8 lb

## 2015-11-04 DIAGNOSIS — Z954 Presence of other heart-valve replacement: Secondary | ICD-10-CM

## 2015-11-04 DIAGNOSIS — I71019 Dissection of thoracic aorta, unspecified: Secondary | ICD-10-CM

## 2015-11-04 DIAGNOSIS — Z952 Presence of prosthetic heart valve: Secondary | ICD-10-CM

## 2015-11-04 DIAGNOSIS — I7101 Dissection of thoracic aorta: Secondary | ICD-10-CM

## 2015-11-04 NOTE — Patient Instructions (Signed)

## 2015-11-11 DIAGNOSIS — C469 Kaposi's sarcoma, unspecified: Secondary | ICD-10-CM | POA: Diagnosis not present

## 2015-11-11 DIAGNOSIS — Z6834 Body mass index (BMI) 34.0-34.9, adult: Secondary | ICD-10-CM | POA: Diagnosis not present

## 2015-11-11 DIAGNOSIS — Z87891 Personal history of nicotine dependence: Secondary | ICD-10-CM | POA: Diagnosis not present

## 2015-11-11 DIAGNOSIS — H9042 Sensorineural hearing loss, unilateral, left ear, with unrestricted hearing on the contralateral side: Secondary | ICD-10-CM | POA: Diagnosis not present

## 2015-11-11 DIAGNOSIS — Z9621 Cochlear implant status: Secondary | ICD-10-CM | POA: Diagnosis not present

## 2015-11-11 DIAGNOSIS — I10 Essential (primary) hypertension: Secondary | ICD-10-CM | POA: Diagnosis not present

## 2015-11-11 DIAGNOSIS — Z4889 Encounter for other specified surgical aftercare: Secondary | ICD-10-CM | POA: Diagnosis not present

## 2015-12-15 ENCOUNTER — Ambulatory Visit (INDEPENDENT_AMBULATORY_CARE_PROVIDER_SITE_OTHER): Payer: BLUE CROSS/BLUE SHIELD | Admitting: General Practice

## 2015-12-15 DIAGNOSIS — Z7901 Long term (current) use of anticoagulants: Secondary | ICD-10-CM | POA: Diagnosis not present

## 2015-12-15 DIAGNOSIS — Z954 Presence of other heart-valve replacement: Secondary | ICD-10-CM

## 2015-12-15 DIAGNOSIS — Z5181 Encounter for therapeutic drug level monitoring: Secondary | ICD-10-CM

## 2015-12-15 DIAGNOSIS — Z952 Presence of prosthetic heart valve: Secondary | ICD-10-CM

## 2015-12-15 LAB — POCT INR: INR: 2.4

## 2015-12-15 NOTE — Progress Notes (Signed)
I have reviewed and agree with the plan. 

## 2015-12-15 NOTE — Progress Notes (Signed)
Pre visit review using our clinic review tool, if applicable. No additional management support is needed unless otherwise documented below in the visit note. 

## 2016-02-02 ENCOUNTER — Ambulatory Visit (INDEPENDENT_AMBULATORY_CARE_PROVIDER_SITE_OTHER): Payer: BLUE CROSS/BLUE SHIELD | Admitting: General Practice

## 2016-02-02 DIAGNOSIS — Z5181 Encounter for therapeutic drug level monitoring: Secondary | ICD-10-CM

## 2016-02-02 DIAGNOSIS — Z7901 Long term (current) use of anticoagulants: Secondary | ICD-10-CM

## 2016-02-02 DIAGNOSIS — Z952 Presence of prosthetic heart valve: Secondary | ICD-10-CM

## 2016-02-02 DIAGNOSIS — Z954 Presence of other heart-valve replacement: Secondary | ICD-10-CM

## 2016-02-02 LAB — POCT INR: INR: 2.2

## 2016-03-15 ENCOUNTER — Ambulatory Visit (INDEPENDENT_AMBULATORY_CARE_PROVIDER_SITE_OTHER): Payer: BLUE CROSS/BLUE SHIELD | Admitting: General Practice

## 2016-03-15 DIAGNOSIS — Z954 Presence of other heart-valve replacement: Secondary | ICD-10-CM | POA: Diagnosis not present

## 2016-03-15 DIAGNOSIS — Z7901 Long term (current) use of anticoagulants: Secondary | ICD-10-CM

## 2016-03-15 DIAGNOSIS — Z952 Presence of prosthetic heart valve: Secondary | ICD-10-CM

## 2016-03-15 DIAGNOSIS — Z5181 Encounter for therapeutic drug level monitoring: Secondary | ICD-10-CM

## 2016-03-15 LAB — POCT INR: INR: 2.8

## 2016-03-27 NOTE — Progress Notes (Signed)
[name] [address] [place], [place] [address] Phone: [phone] Subjective:    I'm seeing this patient by the request  of:  Cathlean Cower, MD   CC: Right shoulder and left knee pain Follow-up  [id number]  Austin [name] is a 60 y.o. male coming in with complaint of right shoulder pain and left knee pain. Patient was seen nearly 1 year ago. Patient had been doing relatively well but unfortunately was diagnosed with Kaposi sarcoma. Underwent chemotherapy. Is doing relatively well.  Patient's right shoulder pain.  Patient states overall seems to be doing relatively well. Since his been doing exercises on a regular basis he seems to be feeling better. Patient states still some mild impingement. States that certain movements causes a sharp pain that goes away in seconds. No radiation down the arm and no weakness.  Patient is also complaining of left knee pain. Patient states it is more of a discomfort than a pain. Patient states that it is only with some twisting motions. Denies any instability. Denies any swelling. Patient was remain active. Feels that the over-the-counter orthotics have been the most beneficial.   Past Medical History:  Diagnosis Date  . Anemia    iron def  . Aortic valve prosthesis present    a. SJM - [facility] 2003, chronic coumadin.  . Back pain   . Coronary artery disease   . Hyperlipidemia   . Hypertension   . Internal hemorrhoid   . Obesity   . Pericardial effusion   . Renal insufficiency 05/16/2011  . Thoracic aortic aneurysm [facility])    a. s/p dissection and repair @ [facility] 2003 with SJM AVR;  CTA 09/2010 desc thor Ao 5.2 x 5.3-5.5 x 5.5 cm.  . Thyroid disease    Past Surgical History:  Procedure Laterality Date  . ABDOMINAL AORTIC ANEURYSM REPAIR    . AORTIC VALVE REPLACEMENT    . impingment     right shoulder  . KNEE ARTHROSCOPY     left  . WISDOM TOOTH EXTRACTION     Social History  Substance Use Topics  . Smoking  status: Former Smoker    Packs/day: 1.00    Years: 15.00    Types: Cigarettes    Quit date: 10/10/2001  . Smokeless tobacco: Never Used  . Alcohol use Yes     Comment: one drink per day   Allergies  Allergen Reactions  . Celecoxib Itching and Other (See Comments)    Joint pain   Family History  Problem Relation Age of Onset  . Liver cancer Father   . Colon cancer Neg Hx        Past medical history, social, surgical and family history all reviewed in electronic medical record.   Review of Systems: No headache, visual changes, nausea, vomiting, diarrhea, constipation, dizziness, abdominal pain, skin rash, fevers, chills, night sweats, weight loss, swollen lymph nodes, body aches, joint swelling, muscle aches, chest pain, shortness of breath, mood changes.   Objective  Blood pressure 112/70, pulse (!) 58, weight 192 lb (87.1 kg), SpO2 98 %.  General: No apparent distress alert and oriented x3 mood and affect normal, dressed appropriately.  HEENT: Pupils equal, extraocular movements intact  Respiratory: Patient's speak in full sentences and does not appear short of breath  Cardiovascular: No lower extremity edema, non tender, no erythema  Skin: Warm dry intact with no signs of infection or rash on extremities or on axial skeleton.  Abdomen: Soft nontender  Neuro: Cranial nerves II through XII are intact, neurovascularly intact in all extremities with 2+ DTRs and 2+ pulses.  Lymph: No lymphadenopathy of posterior or anterior cervical chain or axillae bilaterally.  Gait normal with good balance and coordination.  MSK:  Non tender with full range of motion and good stability and symmetric strength and tone of  elbows, wrist, hip, and ankles bilaterally.  Shoulder: Right Inspection reveals no abnormalities, atrophy or asymmetry. Palpation is normal with no tenderness over AC joint or bicipital groove. ROM is full in all planes passively. Rotator cuff strength normal  throughout. Minimal impingement signs Speeds and Yergason's tests normal. No labral pathology noted with negative Obrien's, negative clunk and good stability. Normal scapular function observed. No painful arc and no drop arm sign. No apprehension sign ContraLateral shoulder unremarkable   Knee: Right Normal to inspection with no erythema or effusion or obvious bony abnormalities. Nontender on exam today ROM full in flexion and extension and lower leg rotation. Ligaments with solid consistent endpoints including ACL, PCL, LCL, MCL. Negative Mcmurray's, Apley's, and Thessalonian tests. Non painful patellar compression. Patellar glide without crepitus. Patellar and quadriceps tendons unremarkable. Hamstring and quadriceps strength is normal.  Contralateral knee unremarkable.    Foot exam shows some mild pes planus with overpronation of the hindfoot.   Impression and Recommendations:     This case required medical decision making of moderate complexity.

## 2016-03-28 ENCOUNTER — Ambulatory Visit (INDEPENDENT_AMBULATORY_CARE_PROVIDER_SITE_OTHER): Payer: BLUE CROSS/BLUE SHIELD | Admitting: Family Medicine

## 2016-03-28 ENCOUNTER — Encounter: Payer: Self-pay | Admitting: Family Medicine

## 2016-03-28 ENCOUNTER — Other Ambulatory Visit: Payer: Self-pay

## 2016-03-28 VITALS — BP 112/70 | HR 58 | Wt 192.0 lb

## 2016-03-28 DIAGNOSIS — M6752 Plica syndrome, left knee: Secondary | ICD-10-CM

## 2016-03-28 DIAGNOSIS — M216X1 Other acquired deformities of right foot: Secondary | ICD-10-CM

## 2016-03-28 DIAGNOSIS — M216X2 Other acquired deformities of left foot: Secondary | ICD-10-CM

## 2016-03-28 DIAGNOSIS — M25511 Pain in right shoulder: Secondary | ICD-10-CM

## 2016-03-28 DIAGNOSIS — D509 Iron deficiency anemia, unspecified: Secondary | ICD-10-CM

## 2016-03-28 MED ORDER — DICLOFENAC SODIUM 2 % TD SOLN: TRANSDERMAL | 3 refills | Status: DC

## 2016-03-28 NOTE — Assessment & Plan Note (Signed)
Encourage supplementation. Patient continue to monitor.

## 2016-03-28 NOTE — Assessment & Plan Note (Signed)
Mild impingement sign still remaining. We will continue to monitor and continue with conservative therapy.

## 2016-03-28 NOTE — Assessment & Plan Note (Signed)
Pronation of the feet bilaterally. Patient continued work on the exercises. Patient given new posting for his orthotics. Patient continue to wear the custom orthotics. Follow-up as needed. Discussed that this should last 2 years.

## 2016-03-28 NOTE — Patient Instructions (Signed)
God to see you  You should be proud f yourself but keep it up! Exercises 2-3 times a week indefinitely.  pennsaid pinkie amount topically 2 times daily as needed.  Vitamin D 2000 IU daily  Tart cherry extract any dose at night As long as you do well see me when you nee dme.

## 2016-03-28 NOTE — Assessment & Plan Note (Signed)
Seems to be doing relatively well this time. Discussed with him to continue the exercises 2-3 times a week. We discussed the importance of the vastus medialis oblique. We discussed an lower impact exercises be more beneficial. Patient given topical anti-inflammatories that I think will be helpful. We discussed icing regimen in which activities to avoid. Patient will come back and see me again on an as-needed basis.

## 2016-04-21 ENCOUNTER — Ambulatory Visit (INDEPENDENT_AMBULATORY_CARE_PROVIDER_SITE_OTHER): Payer: BLUE CROSS/BLUE SHIELD | Admitting: General Practice

## 2016-04-21 DIAGNOSIS — Z7901 Long term (current) use of anticoagulants: Secondary | ICD-10-CM

## 2016-04-21 DIAGNOSIS — Z952 Presence of prosthetic heart valve: Secondary | ICD-10-CM

## 2016-04-21 DIAGNOSIS — Z5181 Encounter for therapeutic drug level monitoring: Secondary | ICD-10-CM

## 2016-04-21 DIAGNOSIS — Z23 Encounter for immunization: Secondary | ICD-10-CM

## 2016-04-21 DIAGNOSIS — Z954 Presence of other heart-valve replacement: Secondary | ICD-10-CM | POA: Diagnosis not present

## 2016-04-21 LAB — POCT INR: INR: 2.3

## 2016-04-21 NOTE — Progress Notes (Signed)
I have reviewed and agree with the plan. 

## 2016-04-26 ENCOUNTER — Ambulatory Visit (HOSPITAL_BASED_OUTPATIENT_CLINIC_OR_DEPARTMENT_OTHER): Payer: BLUE CROSS/BLUE SHIELD | Admitting: Oncology

## 2016-04-26 ENCOUNTER — Other Ambulatory Visit (HOSPITAL_BASED_OUTPATIENT_CLINIC_OR_DEPARTMENT_OTHER): Payer: BLUE CROSS/BLUE SHIELD

## 2016-04-26 ENCOUNTER — Telehealth: Payer: Self-pay | Admitting: Oncology

## 2016-04-26 VITALS — BP 125/77 | HR 57 | Temp 97.8°F | Resp 18 | Ht 68.0 in | Wt 188.0 lb

## 2016-04-26 DIAGNOSIS — C469 Kaposi's sarcoma, unspecified: Secondary | ICD-10-CM | POA: Diagnosis not present

## 2016-04-26 LAB — CBC WITH DIFFERENTIAL/PLATELET
BASO%: 0.3 % (ref 0.0–2.0)
BASOS ABS: 0 10*3/uL (ref 0.0–0.1)
EOS%: 3.4 % (ref 0.0–7.0)
Eosinophils Absolute: 0.1 10*3/uL (ref 0.0–0.5)
HEMATOCRIT: 43.5 % (ref 38.4–49.9)
HEMOGLOBIN: 14.9 g/dL (ref 13.0–17.1)
LYMPH#: 1.2 10*3/uL (ref 0.9–3.3)
LYMPH%: 34.5 % (ref 14.0–49.0)
MCH: 28.6 pg (ref 27.2–33.4)
MCHC: 34.3 g/dL (ref 32.0–36.0)
MCV: 83.5 fL (ref 79.3–98.0)
MONO#: 0.3 10*3/uL (ref 0.1–0.9)
MONO%: 8.4 % (ref 0.0–14.0)
NEUT%: 53.4 % (ref 39.0–75.0)
NEUTROS ABS: 1.9 10*3/uL (ref 1.5–6.5)
Platelets: 167 10*3/uL (ref 140–400)
RBC: 5.21 10*6/uL (ref 4.20–5.82)
RDW: 13.5 % (ref 11.0–14.6)
WBC: 3.6 10*3/uL — AB (ref 4.0–10.3)

## 2016-04-26 LAB — COMPREHENSIVE METABOLIC PANEL
ALBUMIN: 4.2 g/dL (ref 3.5–5.0)
ALK PHOS: 31 U/L — AB (ref 40–150)
ALT: 95 U/L — AB (ref 0–55)
AST: 78 U/L — AB (ref 5–34)
Anion Gap: 9 mEq/L (ref 3–11)
BUN: 27 mg/dL — AB (ref 7.0–26.0)
CALCIUM: 9.6 mg/dL (ref 8.4–10.4)
CO2: 24 mEq/L (ref 22–29)
CREATININE: 1.7 mg/dL — AB (ref 0.7–1.3)
Chloride: 110 mEq/L — ABNORMAL HIGH (ref 98–109)
EGFR: 51 mL/min/{1.73_m2} — ABNORMAL LOW (ref >90)
GLUCOSE: 91 mg/dL (ref 70–140)
Potassium: 4.6 mEq/L (ref 3.5–5.1)
Sodium: 144 mEq/L (ref 136–145)
TOTAL PROTEIN: 6.8 g/dL (ref 6.4–8.3)
Total Bilirubin: 0.91 mg/dL (ref 0.20–1.20)

## 2016-04-26 NOTE — Telephone Encounter (Signed)
Gave patient avs report and appointments for March 2018.  °

## 2016-04-26 NOTE — Progress Notes (Signed)
Hematology and Oncology Follow Up Visit  BURKLEY [name] [id number] 1955-08-12 60 y.o. 04/26/2016 9:31 AM Cathlean Cower, MDJohn, [name], MD   Principle Diagnosis: 60 year old gentleman with Kaposi's sarcoma diagnosed in April 2016. He presented with cutaneous lesions in his lower extremities. He is HIV negative.   Past therapy: Doxil chemotherapy given at 30 mg/m every 4 weeks the first cycle given at [facility] in June 2016. He is status post 6 cycles completed in November 2016.  Current therapy: Observation surveillance.  Interim History:  [name] presents today for a follow-up visit. Since his last visit, he continues to feel reasonably well without any complications. He remains active and attends to activities of daily living without any decline.  He reports no new skin lesions or delayed complications related to chemotherapy. He denied any respiratory symptoms such as cough or hemoptysis. He denied any pruritus or erythema. He remains on warfarin for his metallic valve and has not had any bleeding complications.   He does not report any headaches, blurry vision, syncope or seizures. He does not report any fevers, chills, sweats or weight loss. He does not report any chest pain,  orthopnea or leg edema. He does not report any cough, wheezing or shortness of breath. He does not report any nausea, vomiting, abdominal pain, hematochezia or. He does not report any frequency, urgency or hesitancy. He does not report any skeletal complaints. Remainder review of systems unremarkable.   Medications: I have reviewed the patient's current medications.  Current Outpatient Prescriptions  Medication Sig Dispense Refill  . Diclofenac Sodium 2 % SOLN Apply 1 pump twice daily. 112 g 3  . fenofibrate 160 MG tablet Take 1 tablet (160 mg total) by mouth daily. 90 tablet 3  . ferrous sulfate 325 (65 FE) MG tablet Take 1 tablet (325 mg total) by mouth daily with breakfast. 30 tablet  3  . losartan (COZAAR) 50 MG tablet Take 1 tablet (50 mg total) by mouth daily. 90 tablet 3  . metoprolol (TOPROL XL) 200 MG 24 hr tablet Take 1 tablet (200 mg total) by mouth daily. 90 tablet 3  . Multiple Vitamin (MULTIVITAMIN) tablet Take 1 tablet by mouth daily.    . rosuvastatin (CRESTOR) 40 MG tablet Take 1 tablet (40 mg total) by mouth daily. 90 tablet 3  . warfarin (COUMADIN) 5 MG tablet TAKE AS DIRECTED BY ANTICOAGULATION CLINIC 135 tablet 1   No current facility-administered medications for this visit.      Allergies:  Allergies  Allergen Reactions  . Celecoxib Itching and Other (See Comments)    Joint pain    Past Medical History, Surgical history, Social history, and Family History were reviewed and updated.   Physical Exam: Blood pressure 125/77, pulse (!) 57, temperature 97.8 F (36.6 C), temperature source Oral, resp. rate 18, height 5\' 8"  (1.727 m), weight 188 lb (85.3 kg), SpO2 100 %. ECOG: 0 General appearance: Alert, awake gentleman without distress. Head: Normocephalic, without obvious abnormality no oral ulcers or lesions. Neck: no adenopathy Lymph nodes: Cervical, supraclavicular, and axillary nodes normal. Heart:regular rate and rhythm, S1, S2 normal, no murmur, click, rub or gallop Lung:chest clear, no wheezing, rales, normal symmetric air entry Abdomin: soft, non-tender, without masses or organomegaly no shifting dullness or ascites. EXT: Scaly raised lower extremity lesions noted a left more than the right. No erythema or induration.   Lab Results: Lab Results  Component Value Date   WBC 3.6 (L) 04/26/2016  HGB 14.9 04/26/2016   HCT 43.5 04/26/2016   MCV 83.5 04/26/2016   PLT 167 04/26/2016     Chemistry      Component Value Date/Time   NA 143 10/22/2015 0848   K 4.5 10/22/2015 0848   CL 108 10/04/2015 1140   CO2 25 10/22/2015 0848   BUN 29.4 (H) 10/22/2015 0848   CREATININE 2.0 (H) 10/22/2015 0848      Component Value Date/Time    CALCIUM 9.2 10/22/2015 0848   ALKPHOS 25 (L) 10/22/2015 0848   AST 31 10/22/2015 0848   ALT 29 10/22/2015 0848   BILITOT 0.75 10/22/2015 0848       Impression and Plan:   60 year old gentleman with the following issues:  1. 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 He was evaluated by Dr. Kendall Flack at [facility] in his HIV testing was negative. He felt that he might have sporadic case of Kaposi's sarcoma rather than HIV related case.   He completed 6 cycles of Doxil with near resolution of his Kaposi's lesions.   He continues to have excellent response to therapy without any relapse of recurrent disease. The plan is to continue to monitor him periodically and restart Doxil as needed.   2. Cardiomyopathy prophylaxis: He did have an echocardiogram on 12/10/2014 done at [facility] and showed a preserved LV function of 55-60%. Future echocardiogram ordered by his cardiologist.  3. 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 No other abnormalities noted since last visit.  4. Follow-up: Will be in 6 months.    [facility], MD 9/27/20179:31 AM

## 2016-05-08 ENCOUNTER — Ambulatory Visit (HOSPITAL_COMMUNITY)
Admission: RE | Admit: 2016-05-08 | Discharge: 2016-05-08 | Disposition: A | Discharge status: Home / Self Care | Payer: BLUE CROSS/BLUE SHIELD | Source: Ambulatory Visit | Attending: Oncology | Admitting: Oncology

## 2016-05-08 DIAGNOSIS — I712 Thoracic aortic aneurysm, without rupture: Secondary | ICD-10-CM | POA: Diagnosis not present

## 2016-05-08 DIAGNOSIS — R918 Other nonspecific abnormal finding of lung field: Secondary | ICD-10-CM | POA: Diagnosis not present

## 2016-05-08 DIAGNOSIS — C469 Kaposi's sarcoma, unspecified: Secondary | ICD-10-CM | POA: Insufficient documentation

## 2016-06-02 ENCOUNTER — Ambulatory Visit (INDEPENDENT_AMBULATORY_CARE_PROVIDER_SITE_OTHER): Payer: BLUE CROSS/BLUE SHIELD | Admitting: General Practice

## 2016-06-02 DIAGNOSIS — Z5181 Encounter for therapeutic drug level monitoring: Secondary | ICD-10-CM

## 2016-06-02 DIAGNOSIS — Z7901 Long term (current) use of anticoagulants: Secondary | ICD-10-CM

## 2016-06-02 DIAGNOSIS — Z952 Presence of prosthetic heart valve: Secondary | ICD-10-CM

## 2016-06-02 LAB — POCT INR: INR: 2.5

## 2016-06-02 NOTE — Patient Instructions (Signed)
Pre visit review using our clinic review tool, if applicable. No additional management support is needed unless otherwise documented below in the visit note. 

## 2016-06-02 NOTE — Progress Notes (Signed)
I have reviewed and agree with the plan. 

## 2016-06-26 DIAGNOSIS — Z9889 Other specified postprocedural states: Secondary | ICD-10-CM | POA: Diagnosis not present

## 2016-06-26 DIAGNOSIS — Z952 Presence of prosthetic heart valve: Secondary | ICD-10-CM | POA: Diagnosis not present

## 2016-06-26 DIAGNOSIS — C469 Kaposi's sarcoma, unspecified: Secondary | ICD-10-CM | POA: Diagnosis not present

## 2016-06-26 DIAGNOSIS — N183 Chronic kidney disease, stage 3 (moderate): Secondary | ICD-10-CM | POA: Diagnosis not present

## 2016-07-07 DIAGNOSIS — H9042 Sensorineural hearing loss, unilateral, left ear, with unrestricted hearing on the contralateral side: Secondary | ICD-10-CM | POA: Diagnosis not present

## 2016-07-21 ENCOUNTER — Ambulatory Visit (INDEPENDENT_AMBULATORY_CARE_PROVIDER_SITE_OTHER): Payer: BLUE CROSS/BLUE SHIELD | Admitting: General Practice

## 2016-07-21 DIAGNOSIS — Z5181 Encounter for therapeutic drug level monitoring: Secondary | ICD-10-CM

## 2016-07-21 DIAGNOSIS — Z952 Presence of prosthetic heart valve: Secondary | ICD-10-CM

## 2016-07-21 LAB — POCT INR: INR: 2.1

## 2016-07-21 NOTE — Progress Notes (Signed)
I have reviewed and agree with the plan. 

## 2016-07-21 NOTE — Patient Instructions (Signed)
Pre visit review using our clinic review tool, if applicable. No additional management support is needed unless otherwise documented below in the visit note. 

## 2016-08-01 ENCOUNTER — Other Ambulatory Visit: Payer: Self-pay | Admitting: Nurse Practitioner

## 2016-09-01 ENCOUNTER — Ambulatory Visit (INDEPENDENT_AMBULATORY_CARE_PROVIDER_SITE_OTHER): Payer: BLUE CROSS/BLUE SHIELD | Admitting: General Practice

## 2016-09-01 DIAGNOSIS — Z952 Presence of prosthetic heart valve: Secondary | ICD-10-CM

## 2016-09-01 DIAGNOSIS — Z5181 Encounter for therapeutic drug level monitoring: Secondary | ICD-10-CM

## 2016-09-01 LAB — POCT INR: INR: 2

## 2016-09-01 NOTE — Patient Instructions (Signed)
Pre visit review using our clinic review tool, if applicable. No additional management support is needed unless otherwise documented below in the visit note. 

## 2016-09-01 NOTE — Progress Notes (Signed)
I have reviewed and agree with the plan. 

## 2016-09-08 DIAGNOSIS — H905 Unspecified sensorineural hearing loss: Secondary | ICD-10-CM | POA: Diagnosis not present

## 2016-09-08 DIAGNOSIS — Z45321 Encounter for adjustment and management of cochlear device: Secondary | ICD-10-CM | POA: Diagnosis not present

## 2016-10-05 ENCOUNTER — Other Ambulatory Visit (INDEPENDENT_AMBULATORY_CARE_PROVIDER_SITE_OTHER): Payer: BLUE CROSS/BLUE SHIELD

## 2016-10-05 ENCOUNTER — Ambulatory Visit (INDEPENDENT_AMBULATORY_CARE_PROVIDER_SITE_OTHER): Payer: BLUE CROSS/BLUE SHIELD | Admitting: Internal Medicine

## 2016-10-05 ENCOUNTER — Encounter: Payer: Self-pay | Admitting: Internal Medicine

## 2016-10-05 VITALS — BP 126/88 | HR 88 | Temp 97.6°F | Ht 68.0 in | Wt 185.0 lb

## 2016-10-05 DIAGNOSIS — R739 Hyperglycemia, unspecified: Secondary | ICD-10-CM | POA: Diagnosis not present

## 2016-10-05 DIAGNOSIS — I10 Essential (primary) hypertension: Secondary | ICD-10-CM | POA: Diagnosis not present

## 2016-10-05 DIAGNOSIS — Z0001 Encounter for general adult medical examination with abnormal findings: Secondary | ICD-10-CM

## 2016-10-05 DIAGNOSIS — Z0000 Encounter for general adult medical examination without abnormal findings: Secondary | ICD-10-CM | POA: Diagnosis not present

## 2016-10-05 LAB — CBC WITH DIFFERENTIAL/PLATELET
BASOS ABS: 0 10*3/uL (ref 0.0–0.1)
Basophils Relative: 0.7 % (ref 0.0–3.0)
EOS PCT: 2.6 % (ref 0.0–5.0)
Eosinophils Absolute: 0.2 10*3/uL (ref 0.0–0.7)
HEMATOCRIT: 47.2 % (ref 39.0–52.0)
Hemoglobin: 16 g/dL (ref 13.0–17.0)
LYMPHS PCT: 19.9 % (ref 12.0–46.0)
Lymphs Abs: 1.3 10*3/uL (ref 0.7–4.0)
MCHC: 33.9 g/dL (ref 30.0–36.0)
MCV: 84.9 fl (ref 78.0–100.0)
MONOS PCT: 8.6 % (ref 3.0–12.0)
Monocytes Absolute: 0.5 10*3/uL (ref 0.1–1.0)
NEUTROS ABS: 4.3 10*3/uL (ref 1.4–7.7)
Neutrophils Relative %: 68.2 % (ref 43.0–77.0)
PLATELETS: 223 10*3/uL (ref 150.0–400.0)
RBC: 5.55 Mil/uL (ref 4.22–5.81)
RDW: 13.9 % (ref 11.5–15.5)
WBC: 6.3 10*3/uL (ref 4.0–10.5)

## 2016-10-05 LAB — URINALYSIS, ROUTINE W REFLEX MICROSCOPIC
BILIRUBIN URINE: NEGATIVE
Hgb urine dipstick: NEGATIVE
LEUKOCYTES UA: NEGATIVE
Nitrite: NEGATIVE
RBC / HPF: NONE SEEN (ref 0–?)
Specific Gravity, Urine: 1.02 (ref 1.000–1.030)
Total Protein, Urine: NEGATIVE
UROBILINOGEN UA: 0.2 (ref 0.0–1.0)
Urine Glucose: NEGATIVE
pH: 6 (ref 5.0–8.0)

## 2016-10-05 LAB — BASIC METABOLIC PANEL
BUN: 31 mg/dL — ABNORMAL HIGH (ref 6–23)
CALCIUM: 10.1 mg/dL (ref 8.4–10.5)
CO2: 29 meq/L (ref 19–32)
CREATININE: 1.52 mg/dL — AB (ref 0.40–1.50)
Chloride: 112 mEq/L (ref 96–112)
GFR: 60.17 mL/min (ref >60.00)
Glucose, Bld: 93 mg/dL (ref 70–99)
Potassium: 4.6 mEq/L (ref 3.5–5.1)
Sodium: 143 mEq/L (ref 135–145)

## 2016-10-05 LAB — LIPID PANEL
CHOL/HDL RATIO: 4
Cholesterol: 192 mg/dL (ref 0–200)
HDL: 47.3 mg/dL (ref >39.00)
LDL CALC: 109 mg/dL — AB (ref 0–99)
NONHDL: 145.02
Triglycerides: 180 mg/dL — ABNORMAL HIGH (ref 0.0–149.0)
VLDL: 36 mg/dL (ref 0.0–40.0)

## 2016-10-05 LAB — HEPATIC FUNCTION PANEL
ALT: 33 U/L (ref 0–53)
AST: 33 U/L (ref 0–37)
Albumin: 4.6 g/dL (ref 3.5–5.2)
Alkaline Phosphatase: 28 U/L — ABNORMAL LOW (ref 39–117)
BILIRUBIN DIRECT: 0.1 mg/dL (ref 0.0–0.3)
TOTAL PROTEIN: 6.7 g/dL (ref 6.0–8.3)
Total Bilirubin: 0.6 mg/dL (ref 0.2–1.2)

## 2016-10-05 LAB — HEMOGLOBIN A1C: HEMOGLOBIN A1C: 5.7 % (ref 4.6–6.5)

## 2016-10-05 MED ORDER — WARFARIN SODIUM 5 MG PO TABS: ORAL_TABLET | ORAL | 1 refills | Status: DC

## 2016-10-05 MED ORDER — METOPROLOL SUCCINATE ER 200 MG PO TB24: 200.0000 mg | ORAL_TABLET | Freq: Every day | ORAL | 3 refills | Status: DC

## 2016-10-05 MED ORDER — FENOFIBRATE 160 MG PO TABS: 160.0000 mg | ORAL_TABLET | Freq: Every day | ORAL | 3 refills | Status: DC

## 2016-10-05 MED ORDER — ROSUVASTATIN CALCIUM 40 MG PO TABS: 40.0000 mg | ORAL_TABLET | Freq: Every day | ORAL | 3 refills | Status: DC

## 2016-10-05 MED ORDER — LOSARTAN POTASSIUM 25 MG PO TABS: 25.0000 mg | ORAL_TABLET | Freq: Every day | ORAL | 3 refills | Status: DC

## 2016-10-05 NOTE — Patient Instructions (Signed)

## 2016-10-05 NOTE — Progress Notes (Signed)
Subjective:    Patient ID: Austin [name], male    DOB: [birth year], 61 y.o.   MRN: [id number]  HPI Here for wellness and f/u;  Overall doing ok;  Pt denies Chest pain, worsening SOB, DOE, wheezing, orthopnea, PND, worsening LE edema, palpitations, dizziness or syncope.  Pt denies neurological change such as new headache, facial or extremity weakness.  Pt denies polydipsia, polyuria, or low sugar symptoms. Pt states overall good compliance with treatment and medications, good tolerability, and has been trying to follow appropriate diet.  Pt denies worsening depressive symptoms, suicidal ideation or panic. No fever, night sweats, wt loss, loss of appetite, or other constitutional symptoms.  Pt states good ability with ADL's, has low fall risk, home safety reviewed and adequate, no other significant changes in hearing or vision except has new left hearing aid that works well, and only very occasionally active with exercise. Wt Readings from Last 3 Encounters:  10/05/16 185 lb (83.9 kg)  04/26/16 188 lb (85.3 kg)  03/28/16 192 lb (87.1 kg)  Due for repeat cologuard 2019.   Does mention BP at home has been lower side with some wt loss assoc with dizziness Past Medical History:  Diagnosis Date  . Anemia    iron def  . Aortic valve prosthesis present    a. SJM - [facility] 2003, chronic coumadin.  . Back pain   . Coronary artery disease   . Hyperlipidemia   . Hypertension   . Internal hemorrhoid   . Obesity   . Pericardial effusion   . Renal insufficiency 05/16/2011  . Thoracic aortic aneurysm [facility])    a. s/p dissection and repair @ [facility] 2003 with SJM AVR;  CTA 09/2010 desc thor Ao 5.2 x 5.3-5.5 x 5.5 cm.  . Thyroid disease    Past Surgical History:  Procedure Laterality Date  . ABDOMINAL AORTIC ANEURYSM REPAIR    . AORTIC VALVE REPLACEMENT    . impingment     right shoulder  . KNEE ARTHROSCOPY     left  . WISDOM TOOTH EXTRACTION      reports that he quit smoking about 15 years ago. His  smoking use included Cigarettes. He has a 15.00 pack-year smoking history. He has never used smokeless tobacco. He reports that he drinks alcohol. He reports that he does not use drugs. family history includes Liver cancer in his father. Allergies  Allergen Reactions  . Celecoxib Itching and Other (See Comments)    Joint pain   Current Outpatient Prescriptions on File Prior to Visit  Medication Sig Dispense Refill  . Diclofenac Sodium 2 % SOLN Apply 1 pump twice daily. 112 g 3  . ferrous sulfate 325 (65 FE) MG tablet Take 1 tablet (325 mg total) by mouth daily with breakfast. 30 tablet 3  . Multiple Vitamin (MULTIVITAMIN) tablet Take 1 tablet by mouth daily.     No current facility-administered medications on file prior to visit.    Review of Systems Constitutional: Negative for increased diaphoresis, or other activity, appetite or siginficant weight change other than noted HENT: Negative for worsening hearing loss, ear pain, facial swelling, mouth sores and neck stiffness.   Eyes: Negative for other worsening pain, redness or visual disturbance.  Respiratory: Negative for choking or stridor Cardiovascular: Negative for other chest pain and palpitations.  Gastrointestinal: Negative for worsening diarrhea, blood in stool, or abdominal distention Genitourinary: Negative for hematuria, flank pain or change in urine volume.  Musculoskeletal: Negative for myalgias or other joint  complaints.  Skin: Negative for other color change and wound or drainage.  Neurological: Negative for syncope and numbness. other than noted Hematological: Negative for adenopathy. or other swelling Psychiatric/Behavioral: Negative for hallucinations, SI, self-injury, decreased concentration or other worsening agitation.  All other system neg per pt    Objective:   Physical Exam BP 126/88   Pulse 88   Temp 97.6 F (36.4 C)   Ht 5\' 8"  (1.727 m)   Wt 185 lb (83.9 kg)   SpO2 98%   BMI 28.13 kg/m  VS noted,    Constitutional: Pt is oriented to person, place, and time. Appears well-developed and well-nourished, in no significant distress Head: Normocephalic and atraumatic  Eyes: Conjunctivae and EOM are normal. Pupils are equal, round, and reactive to light Right Ear: External ear normal.  Left Ear: External ear normal Nose: Nose normal.  Mouth/Throat: Oropharynx is clear and moist  Neck: Normal range of motion. Neck supple. No JVD present. No tracheal deviation present or significant neck LA or mass Cardiovascular: Normal rate, regular rhythm, normal heart sounds and intact distal pulses.   Pulmonary/Chest: Effort normal and breath sounds without rales or wheezing  Abdominal: Soft. Bowel sounds are normal. NT. No HSM  Musculoskeletal: Normal range of motion. Exhibits no edema Lymphadenopathy: Has no cervical adenopathy.  Neurological: Pt is alert and oriented to person, place, and time. Pt has normal reflexes. No cranial nerve deficit. Motor grossly intact Skin: Skin is warm and dry. No rash noted or new ulcers Psychiatric:  Has normal mood and affect. Behavior is normal.  No other new exam findings  ECG I have personally interpreted today Sinus  Rhythm  -Incomplete right bundle branch block.   -Left atrial enlargement.   -  Diffuse nonspecific T-abnormality.     Assessment & Plan:

## 2016-10-06 ENCOUNTER — Other Ambulatory Visit: Payer: Self-pay | Admitting: Internal Medicine

## 2016-10-06 DIAGNOSIS — R972 Elevated prostate specific antigen [PSA]: Secondary | ICD-10-CM

## 2016-10-06 LAB — TSH: TSH: 1.92 u[IU]/mL (ref 0.35–4.50)

## 2016-10-06 LAB — PSA: PSA: 2.41 ng/mL (ref 0.10–4.00)

## 2016-10-07 NOTE — Assessment & Plan Note (Signed)

## 2016-10-07 NOTE — Assessment & Plan Note (Signed)
stable overall by history and exam, recent data reviewed with pt, and pt to continue medical treatment as before,  to f/u any worsening symptoms or concerns Lab Results  Component Value Date   HGBA1C 5.7 10/05/2016

## 2016-10-07 NOTE — Assessment & Plan Note (Signed)
With some lower BP recently, for reduced losartan to 25 qd,  to f/u any worsening symptoms or concerns

## 2016-10-13 ENCOUNTER — Ambulatory Visit (INDEPENDENT_AMBULATORY_CARE_PROVIDER_SITE_OTHER): Payer: BLUE CROSS/BLUE SHIELD | Admitting: General Practice

## 2016-10-13 DIAGNOSIS — Z5181 Encounter for therapeutic drug level monitoring: Secondary | ICD-10-CM | POA: Diagnosis not present

## 2016-10-13 DIAGNOSIS — Z952 Presence of prosthetic heart valve: Secondary | ICD-10-CM

## 2016-10-13 LAB — POCT INR: INR: 2.1

## 2016-10-13 NOTE — Progress Notes (Signed)
I have reviewed and agree with the plan. 

## 2016-10-13 NOTE — Patient Instructions (Signed)
Pre visit review using our clinic review tool, if applicable. No additional management support is needed unless otherwise documented below in the visit note. 

## 2016-11-03 ENCOUNTER — Telehealth: Payer: Self-pay | Admitting: Oncology

## 2016-11-03 ENCOUNTER — Ambulatory Visit (HOSPITAL_BASED_OUTPATIENT_CLINIC_OR_DEPARTMENT_OTHER): Payer: BLUE CROSS/BLUE SHIELD | Admitting: Oncology

## 2016-11-03 ENCOUNTER — Other Ambulatory Visit (HOSPITAL_BASED_OUTPATIENT_CLINIC_OR_DEPARTMENT_OTHER): Payer: BLUE CROSS/BLUE SHIELD

## 2016-11-03 VITALS — BP 127/74 | HR 61 | Temp 98.6°F | Resp 18 | Ht 68.0 in | Wt 193.7 lb

## 2016-11-03 DIAGNOSIS — I429 Cardiomyopathy, unspecified: Secondary | ICD-10-CM | POA: Diagnosis not present

## 2016-11-03 DIAGNOSIS — C469 Kaposi's sarcoma, unspecified: Secondary | ICD-10-CM

## 2016-11-03 LAB — COMPREHENSIVE METABOLIC PANEL
ALT: 29 U/L (ref 0–55)
AST: 29 U/L (ref 5–34)
Albumin: 4.2 g/dL (ref 3.5–5.0)
Alkaline Phosphatase: 25 U/L — ABNORMAL LOW (ref 40–150)
Anion Gap: 7 mEq/L (ref 3–11)
BUN: 33.5 mg/dL — ABNORMAL HIGH (ref 7.0–26.0)
CO2: 27 mEq/L (ref 22–29)
Calcium: 9.5 mg/dL (ref 8.4–10.4)
Chloride: 109 mEq/L (ref 98–109)
Creatinine: 1.5 mg/dL — ABNORMAL HIGH (ref 0.7–1.3)
EGFR: 57 mL/min/{1.73_m2} — ABNORMAL LOW (ref >90)
Glucose: 70 mg/dl (ref 70–140)
Potassium: 5 mEq/L (ref 3.5–5.1)
Sodium: 143 mEq/L (ref 136–145)
Total Bilirubin: 0.7 mg/dL (ref 0.20–1.20)
Total Protein: 6.4 g/dL (ref 6.4–8.3)

## 2016-11-03 LAB — CBC WITH DIFFERENTIAL/PLATELET
BASO%: 1.2 % (ref 0.0–2.0)
Basophils Absolute: 0.1 10*3/uL (ref 0.0–0.1)
EOS ABS: 0.1 10*3/uL (ref 0.0–0.5)
EOS%: 2.3 % (ref 0.0–7.0)
HEMATOCRIT: 45.4 % (ref 38.4–49.9)
HEMOGLOBIN: 15.4 g/dL (ref 13.0–17.1)
LYMPH%: 29.5 % (ref 14.0–49.0)
MCH: 29.2 pg (ref 27.2–33.4)
MCHC: 34 g/dL (ref 32.0–36.0)
MCV: 85.7 fL (ref 79.3–98.0)
MONO#: 0.4 10*3/uL (ref 0.1–0.9)
MONO%: 7.2 % (ref 0.0–14.0)
NEUT%: 59.8 % (ref 39.0–75.0)
NEUTROS ABS: 3.1 10*3/uL (ref 1.5–6.5)
PLATELETS: 158 10*3/uL (ref 140–400)
RBC: 5.29 10*6/uL (ref 4.20–5.82)
RDW: 14.2 % (ref 11.0–14.6)
WBC: 5.2 10*3/uL (ref 4.0–10.3)
lymph#: 1.5 10*3/uL (ref 0.9–3.3)

## 2016-11-03 NOTE — Telephone Encounter (Signed)
Gave patient AVS and calender per 11/03/2016 los. Genetics consult scheduled.

## 2016-11-03 NOTE — Progress Notes (Signed)
Hematology and Oncology Follow Up Visit  Austin [name] [id number] 06-03-1956 61 y.o. 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 He presented with cutaneous lesions in his lower extremities. He is HIV negative.   Past therapy: Doxil chemotherapy given at 30 mg/m every 4 weeks the first cycle given at [facility] in June 2016. He is status post 6 cycles completed in November 2016.  Current therapy: Observation surveillance.  Interim History:  [name] presents today for a follow-up visit. Since his last visit, he reports no changes.  He remains active and attends to activities of daily living without any decline.  He reports no new skin lesions or exacerbation of his old skin lesions. He denied any respiratory symptoms such as cough or hemoptysis. He denied any pruritus or erythema. He reports no thrombosis or bleeding episodes. She denied any recent hospitalizations or illnesses.   He does not report any headaches, blurry vision, syncope or seizures. He does not report any fevers, chills, sweats or weight loss. He does not report any chest pain,  orthopnea or leg edema. He does not report any cough, wheezing or shortness of breath. He does not report any nausea, vomiting, abdominal pain, hematochezia or. He does not report any frequency, urgency or hesitancy. He does not report any skeletal complaints. Remainder review of systems unremarkable.   Medications: I have reviewed the patient's current medications.  Current Outpatient Prescriptions  Medication Sig Dispense Refill  . Diclofenac Sodium 2 % SOLN Apply 1 pump twice daily. 112 g 3  . fenofibrate 160 MG tablet Take 1 tablet (160 mg total) by mouth daily. 90 tablet 3  . ferrous sulfate 325 (65 FE) MG tablet Take 1 tablet (325 mg total) by mouth daily with breakfast. 30 tablet 3  . losartan (COZAAR) 25 MG tablet  Take 1 tablet (25 mg total) by mouth daily. 90 tablet 3  . metoprolol (TOPROL XL) 200 MG 24 hr tablet Take 1 tablet (200 mg total) by mouth daily. 90 tablet 3  . Multiple Vitamin (MULTIVITAMIN) tablet Take 1 tablet by mouth daily.    . rosuvastatin (CRESTOR) 40 MG tablet Take 1 tablet (40 mg total) by mouth daily. 90 tablet 3  . warfarin (COUMADIN) 5 MG tablet TAKE AS DIRECTED BY ANTICOAGULATION CLINIC 135 tablet 1   No current facility-administered medications for this visit.      Allergies:  Allergies  Allergen Reactions  . Celecoxib Itching and Other (See Comments)    Joint pain    Past Medical History, Surgical history, Social history, and Family History were reviewed and updated.   Physical Exam: Blood pressure 127/74, pulse 61, temperature 98.6 F (37 C), temperature source Oral, resp. rate 18, height 5\' 8"  (1.727 m), weight 193 lb 11.2 oz (87.9 kg), SpO2 100 %. ECOG: 0 General appearance: Well-appearing gentleman without distress. Head: Normocephalic, without obvious abnormality no oral thrush or ulcers. Neck: no adenopathy Lymph nodes: Cervical, supraclavicular, and axillary nodes normal. Heart:regular rate and rhythm, S1, S2 normal, no murmur, click, rub or gallop Lung:chest clear, no wheezing, rales, normal symmetric air entry Abdomin: soft, non-tender, without masses or organomegaly no rebound or guarding. EXT: Scaly raised lower extremity lesions noted a left more than the right. Unchanged from previous examinations.   Lab Results: Lab Results  Component Value Date   WBC 5.2 11/03/2016   HGB 15.4 11/03/2016   HCT  45.4 11/03/2016   MCV 85.7 11/03/2016   PLT 158 11/03/2016     Chemistry      Component Value Date/Time   NA 143 10/05/2016 1541   NA 144 04/26/2016 0903   K 4.6 10/05/2016 1541   K 4.6 04/26/2016 0903   CL 112 10/05/2016 1541   CO2 29 10/05/2016 1541   CO2 24 04/26/2016 0903   BUN 31 (H) 10/05/2016 1541   BUN 27.0 (H) 04/26/2016 0903    CREATININE 1.52 (H) 10/05/2016 1541   CREATININE 1.7 (H) 04/26/2016 0903      Component Value Date/Time   CALCIUM 10.1 10/05/2016 1541   CALCIUM 9.6 04/26/2016 0903   ALKPHOS 28 (L) 10/05/2016 1541   ALKPHOS 31 (L) 04/26/2016 0903   AST 33 10/05/2016 1541   AST 78 (H) 04/26/2016 0903   ALT 33 10/05/2016 1541   ALT 95 (H) 04/26/2016 0903   BILITOT 0.6 10/05/2016 1541   BILITOT 0.91 04/26/2016 0903       Impression and Plan:   61 year old gentleman with the following issues:  1. Kaposi sarcoma diagnosed in April 2016 after presenting with a cutaneous raised lesion in the right lower leg. 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 He was evaluated by Dr. Kendall Flack at [facility] in his HIV testing was negative. He felt that he might have sporadic case of Kaposi's sarcoma rather than HIV related case.   He completed 6 cycles of Doxil with near resolution of his Kaposi's lesions.   He continues to have excellent response to therapy without any relapse of recurrent disease. The plan is to continue with active surveillance and institute Doxil again upon relapse.   2. Cardiomyopathy prophylaxis: He did have an echocardiogram on 12/10/2014 done at [facility] and showed a preserved LV function of 55-60%. Future echocardiogram ordered by his cardiologist.  3. Genetic considerations: He has developed sporadic illnesses that include Kaposi's sarcoma, cardiomyopathy, hearing loss without clear link between these illnesses. I have recommended genetic counseling for possible syndrome that connects these conditions. He is agreeable to referral.  4. Follow-up: Will be in 6 months.    [facility], MD 4/6/20189:36 AM

## 2016-11-06 ENCOUNTER — Ambulatory Visit (HOSPITAL_BASED_OUTPATIENT_CLINIC_OR_DEPARTMENT_OTHER): Payer: BLUE CROSS/BLUE SHIELD | Admitting: Genetic Counselor

## 2016-11-06 ENCOUNTER — Other Ambulatory Visit: Payer: BLUE CROSS/BLUE SHIELD

## 2016-11-06 ENCOUNTER — Encounter: Payer: Self-pay | Admitting: Genetic Counselor

## 2016-11-06 DIAGNOSIS — Z7183 Encounter for nonprocreative genetic counseling: Secondary | ICD-10-CM

## 2016-11-06 DIAGNOSIS — H905 Unspecified sensorineural hearing loss: Secondary | ICD-10-CM

## 2016-11-06 DIAGNOSIS — I712 Thoracic aortic aneurysm, without rupture, unspecified: Secondary | ICD-10-CM

## 2016-11-06 DIAGNOSIS — H9190 Unspecified hearing loss, unspecified ear: Secondary | ICD-10-CM | POA: Insufficient documentation

## 2016-11-06 DIAGNOSIS — H9042 Sensorineural hearing loss, unilateral, left ear, with unrestricted hearing on the contralateral side: Secondary | ICD-10-CM

## 2016-11-06 DIAGNOSIS — C469 Kaposi's sarcoma, unspecified: Secondary | ICD-10-CM | POA: Diagnosis not present

## 2016-11-06 DIAGNOSIS — Z808 Family history of malignant neoplasm of other organs or systems: Secondary | ICD-10-CM

## 2016-11-06 NOTE — Progress Notes (Signed)
REFERRING PROVIDER: [name], MD [address] [place], [place] [address]  PRIMARY PROVIDER:  Cathlean Cower, MD  PRIMARY REASON FOR VISIT:  1. Kaposi sarcoma ([place])   2. Family history of melanoma   3. Thoracic aortic aneurysm without rupture ([place])   4. Sensorineural hearing loss (SNHL) of left ear, unspecified hearing status on contralateral side      HISTORY OF PRESENT ILLNESS:   Austin [name], a 61 y.o. male, was seen for a [facility] cancer genetics consultation at the request of Dr. Alen Blew due to a personal history of cancer, aortic dissection and SNHL.  Austin [name] presents to clinic today to discuss the possibility of a hereditary predisposition to cancer, genetic testing, and to further clarify his future cancer risks, as well as potential cancer risks for family members.   In 2016, at the age of 75, Austin [name] was diagnosed with Kaposi Sarcoma. This was treated with chemotherapy, but surgery and radiation were not needed. At age 46, Austin [name] had an aortic dissection.  He had this repaired and is seen in cardiology at [facility]. About 10 years later, Austin [name] developed sensorineural hearing loss at age 53, seemingly overnight.  He awoke one morning and could not hear out of his left ear.  A year later he had a cochlear implant placed which was performed by [facility].  In 2016, he developed a skin lesion that was diagnosed as Kaposi sarcoma.  Austin [name] has not ever had a dislocation, he wears glasses but has not had a retinal detachment.  He reports being told that he was more flexible than usual, typically in his fingers and hands.    CANCER HISTORY:   No history exists.     RISK FACTORS:  Prostate Cancer Screening: Performed by primary care doctor.  PSA reportedly has always been normal. Dermatology screening: Does not go on an annual basis. Colonoscopy: yes; normal.   Past Medical History:  Diagnosis Date  . Anemia    iron def  . Aortic valve  prosthesis present    a. SJM - [facility] 2003, chronic coumadin.  . Back pain   . Coronary artery disease   . Family history of melanoma   . Hearing loss   . Hyperlipidemia   . Hypertension   . Internal hemorrhoid   . Kaposi sarcoma ([place])   . Obesity   . Pericardial effusion   . Renal insufficiency 05/16/2011  . Thoracic aortic aneurysm [facility])    a. s/p dissection and repair @ [facility] 2003 with SJM AVR;  CTA 09/2010 desc thor Ao 5.2 x 5.3-5.5 x 5.5 cm.  . Thyroid disease     Past Surgical History:  Procedure Laterality Date  . ABDOMINAL AORTIC ANEURYSM REPAIR    . AORTIC VALVE REPLACEMENT    . impingment     right shoulder  . KNEE ARTHROSCOPY     left  . WISDOM TOOTH EXTRACTION      Social History   Social History  . Marital status: Single    Spouse name: N/A  . Number of children: 0  . Years of education: MDIV   Occupational History  . [employer]   Social History Main Topics  . Smoking status: Former Smoker    Packs/day: 1.00    Years: 15.00    Types: Cigarettes    Quit date: 10/10/2001  . Smokeless tobacco: Never Used  . Alcohol use Yes     Comment:  one drink per day  . Drug use: No  . Sexual activity: Not Currently   Other Topics Concern  . None   Social History Narrative   Pt lives at home alone.   Caffeine Use: Less than 1 cup daily.     FAMILY HISTORY:  We obtained a detailed, 4-generation family history.  Significant diagnoses are listed below: Family History  Problem Relation Age of Onset  . Stroke Mother     late 55s  . Stroke Father 3  . Liver disease Sister     Sclerosing cholegitis necessitating a liver transplant  . Melanoma Maternal Uncle     dx in his 21s  . Heart disease Paternal Aunt   . Other Maternal Grandfather     died under the age of 7 due to a health related issue  . Colon cancer Neg Hx     The patient does not have children.  He has one brother and one sister.  His brother is healthy and cancer free.  His  sister had a liver transplant secondary to sclerosing cholangitis.  His parents are alive.  His mother had a stroke in her 62's.  She had one full brother and several paternal half siblings.  Her brother died of melanoma.  There is no other reported family history of cancer.  The patient's father is 12 and had a stroke at 1.  He has several siblings, none who had cancer, hearing loss or aorta dissection.  Austin [name] is unaware of previous family history of genetic testing for hereditary cancer risks or cardiology risks. Patient's ancestors are of African American descent. There is no reported Ashkenazi Jewish ancestry. There is no known consanguinity.  GENETIC COUNSELING ASSESSMENT: SAN LOHMEYER is a 61 y.o. male with a personal history of dissecting aorta, SNHL and Kaposi sarcoma and family history of sclerosing cholangitis. We, therefore, discussed and recommended the following at today's visit.   DISCUSSION: We discussed that Kaposi sarcoma is not associated with hereditary cancer syndromes, and is more typically associated with immunocompromised situations such as HIV positive status or other conditions that can lower the immune system.  [name] does not have a compromised immune system nor is he HIV positive.  The combination of aortic dissection and SNHL could possibly be related to a collagen disorder, but they could also be separate from each other.  I am especially concerned about his aortic dissection.  He reports not having any specific work up that he remembers to this, and while we discussed Marfan syndrome, he reports that he is not aware of any suggestion of this condition.  We discussed with [name] that the family history is not highly consistent with a familial hereditary cancer syndrome, and we feel he is at low risk to harbor a gene mutation associated with such a condition. Thus, we did not recommend any genetic testing, at this time, and recommended [name]  continue to follow the cancer screening guidelines given by his primary healthcare provider.  We also discussed that he should be seen by someone in cardiology genetics to determine if his aortic dissection could be associated with a hereditary aortopathy syndrome.  Mr. Mikelson is most concerned about what may happen next, and would like to learn more about his risk for a hereditary disease.  I will look into referring him to cardiology genetics at [facility] or [place].  PLAN: We will refer Mr. Heims to cardiology genetics at either [facility] or  [place]. Lastly, we encouraged Mr. Pischke to remain in contact with cancer genetics annually so that we can continuously update the family history and inform him of any changes in cancer genetics and testing that may be of benefit for this family.   Mr.  Fabiano questions were answered to his satisfaction today. Our contact information was provided should additional questions or concerns arise. Thank you for the referral and allowing [place] to share in the care of your patient.   Karen P. Florene Glen, [place], [facility] Certified Genetic Counselor [email or web address] phone: [phone]  The patient was seen for a total of 30 minutes in face-to-face genetic counseling.  This patient was discussed with Drs. Magrinat, Lindi Adie and/or Burr Medico who agrees with the above.    _______________________________________________________________________ For Office Staff:  Number of people involved in session: 1 Was an Intern/ student involved with case: no

## 2016-11-24 ENCOUNTER — Ambulatory Visit (INDEPENDENT_AMBULATORY_CARE_PROVIDER_SITE_OTHER): Payer: BLUE CROSS/BLUE SHIELD | Admitting: General Practice

## 2016-11-24 DIAGNOSIS — Z5181 Encounter for therapeutic drug level monitoring: Secondary | ICD-10-CM | POA: Diagnosis not present

## 2016-11-24 DIAGNOSIS — Z952 Presence of prosthetic heart valve: Secondary | ICD-10-CM

## 2016-11-24 LAB — POCT INR: INR: 1.5

## 2016-11-24 NOTE — Patient Instructions (Signed)
Pre visit review using our clinic review tool, if applicable. No additional management support is needed unless otherwise documented below in the visit note. 

## 2016-11-24 NOTE — Progress Notes (Signed)
I have reviewed and agree with the plan. 

## 2016-12-15 ENCOUNTER — Ambulatory Visit (INDEPENDENT_AMBULATORY_CARE_PROVIDER_SITE_OTHER): Payer: BLUE CROSS/BLUE SHIELD | Admitting: General Practice

## 2016-12-15 DIAGNOSIS — Z5181 Encounter for therapeutic drug level monitoring: Secondary | ICD-10-CM | POA: Diagnosis not present

## 2016-12-15 DIAGNOSIS — Z952 Presence of prosthetic heart valve: Secondary | ICD-10-CM

## 2016-12-15 LAB — POCT INR: INR: 1.9

## 2016-12-15 NOTE — Progress Notes (Signed)
I have reviewed and agree with the plan. 

## 2016-12-15 NOTE — Patient Instructions (Signed)
Pre visit review using our clinic review tool, if applicable. No additional management support is needed unless otherwise documented below in the visit note. 

## 2016-12-20 NOTE — Progress Notes (Signed)
Chief Complaint  Patient presents with  . Follow-up    Aortic dissection      History of Present Illness: 61 yo male with history of HTN, HLD, ascending aortic aneursym/dissection with repair at [facility] in 2003 with [employer] 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 His surgery was at [facility] in 2003. He has had a residual thoracic and abdominal aortic dissection since then. Imaging showed large dissection extending from ascending aorta down into the abdominal aorta and iliacs. The false lumen in the descending thoracic aortic aneurysm is larger than the true lumen. This has been stable for years and CT surgery does not think surgical correction is indicated. There is some involvement of the arch vessels and renal arteries. He was also found to have a right lung nodule and lesions in the liver and kidneys that were hard to characterize. Echo May 2016 at [facility] with normal LV function, normally functioning AVR. He is on chronic coumadin for anticoagulation after mechanical AVR.  CTA chest and abdomen May 2016 at [facility] and stable. 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 He has been seen in [facility], [facility] and is followed in [place]. He is HIV negative. He has completed chemotherapy.    He is here today for follow up. The patient denies any chest pain, dyspnea, palpitations, lower extremity edema, orthopnea, PND, dizziness, near syncope or syncope. No bleeding issues on coumadin.   Primary Care Physician: [name], MD  Past Medical History:  Diagnosis Date  . Anemia    iron def  . Aortic valve prosthesis present    a. SJM - [facility] 2003, chronic coumadin.  . Back pain   . Coronary artery disease   . Family history of melanoma   . Hearing loss   . Hyperlipidemia   . Hypertension   . Internal hemorrhoid   . Kaposi sarcoma ([place])   . Obesity   . Pericardial effusion   . Renal insufficiency  05/16/2011  . Thoracic aortic aneurysm [facility])    a. s/p dissection and repair @ [facility] 2003 with SJM AVR;  CTA 09/2010 desc thor Ao 5.2 x 5.3-5.5 x 5.5 cm.  . Thyroid disease     Past Surgical History:  Procedure Laterality Date  . ABDOMINAL AORTIC ANEURYSM REPAIR    . AORTIC VALVE REPLACEMENT    . impingment     right shoulder  . KNEE ARTHROSCOPY     left  . WISDOM TOOTH EXTRACTION      Current Outpatient Prescriptions  Medication Sig Dispense Refill  . Diclofenac Sodium 2 % SOLN Apply 1 pump twice daily. 112 g 3  . fenofibrate 160 MG tablet Take 1 tablet (160 mg total) by mouth daily. 90 tablet 3  . ferrous sulfate 325 (65 FE) MG tablet Take 1 tablet (325 mg total) by mouth daily with breakfast. 30 tablet 3  . losartan (COZAAR) 25 MG tablet Take 1 tablet (25 mg total) by mouth daily. 90 tablet 3  . metoprolol (TOPROL XL) 200 MG 24 hr tablet Take 1 tablet (200 mg total) by mouth daily. 90 tablet 3  . Multiple Vitamin (MULTIVITAMIN) tablet Take 1 tablet by mouth daily.    . rosuvastatin (CRESTOR) 40 MG tablet Take 1 tablet (40 mg total) by mouth daily. 90 tablet 3  . warfarin (COUMADIN) 5 MG tablet TAKE AS DIRECTED BY ANTICOAGULATION CLINIC 135 tablet 1   No current facility-administered  medications for this visit.     Allergies  Allergen Reactions  . Celecoxib Itching and Other (See Comments)    Joint pain    Social History   Social History  . Marital status: Single    Spouse name: N/A  . Number of children: 0  . Years of education: MDIV   Occupational History  . [employer]   Social History Main Topics  . Smoking status: Former Smoker    Packs/day: 1.00    Years: 15.00    Types: Cigarettes    Quit date: 10/10/2001  . Smokeless tobacco: Never Used  . Alcohol use Yes     Comment: one drink per day  . Drug use: No  . Sexual activity: Not Currently   Other Topics Concern  . Not on file   Social History Narrative   Pt lives at home alone.    Caffeine Use: Less than 1 cup daily.    Family History  Problem Relation Age of Onset  . Stroke Mother        late 84s  . Stroke Father 42  . Liver disease Sister        Sclerosing cholegitis necessitating a liver transplant  . Melanoma Maternal Uncle        dx in his 56s  . Heart disease Paternal Aunt   . Other Maternal Grandfather        died under the age of 66 due to a health related issue  . Colon cancer Neg Hx     Review of Systems:  As stated in the HPI and otherwise negative.   BP 100/62   Pulse 63   Ht 5\' 8"  (1.727 m)   Wt 198 lb 6.4 oz (90 kg)   SpO2 98%   BMI 30.17 kg/m   Physical Examination:  General: Well developed, well nourished, NAD  HEENT: OP clear, mucus membranes moist  SKIN: warm, dry. No rashes. Neuro: No focal deficits  Musculoskeletal: Muscle strength 5/5 all ext  Psychiatric: Mood and affect normal  Neck: No JVD, he has transmitted sounds to both carotids from his systolic murmur. no thyromegaly, no lymphadenopathy.  Lungs:Clear bilaterally, no wheezes, rhonci, crackles Cardiovascular: Regular rate and rhythm. Systolic murmur noted. No gallops or rubs. Abdomen:Soft. Bowel sounds present. Non-tender.  Extremities: No lower extremity edema. Pulses are 2 + in the bilateral DP/PT.  EKG:  EKG is not ordered today. The ekg ordered today demonstrates   Recent Labs: 10/05/2016: TSH 1.92 11/03/2016: ALT 29; BUN 33.5; Creatinine 1.5; HGB 15.4; Platelets 158; Potassium 5.0; Sodium 143   Lipid Panel    Component Value Date/Time   CHOL 192 10/05/2016 1541   TRIG 180.0 (H) 10/05/2016 1541   HDL 47.30 10/05/2016 1541   CHOLHDL 4 10/05/2016 1541   VLDL 36.0 10/05/2016 1541   LDLCALC 109 (H) 10/05/2016 1541   LDLDIRECT 114.0 09/23/2014 0955     Wt Readings from Last 3 Encounters:  12/21/16 198 lb 6.4 oz (90 kg)  11/03/16 193 lb 11.2 oz (87.9 kg)  10/05/16 185 lb (83.9 kg)     Other studies Reviewed: Additional studies/ records that were reviewed  today include: . Review of the above records demonstrates:    Assessment and Plan:   1. THORACIC AORTIC ANEURYSM: He is known to have a type A aortic dissection with extension from his thoracic aorta throughout both iliac arteries affecting the right renal artery but his has been stable. Will repeat scan now. Last  scan in 2016 at [facility].   2. S/P aortic valve replacement: Mechanical valve functioning well by outside echo in 2016. Continue coumadin.   Current medicines are reviewed at length with the patient today.  The patient does not have concerns regarding medicines.  The following changes have been made:  no change  Labs/ tests ordered today include:   Orders Placed This Encounter  Procedures  . CT Angio Chest/Abd/Pel for Dissection W and/or W/WO  . Basic Metabolic Panel (BMET)   Disposition:   FU with me in 12  months  Signed, Lauree Chandler, MD 12/21/2016 8:41 AM    [facility] [address], [place], [place]  [address] Phone: [phone]; Fax: [phone]

## 2016-12-21 ENCOUNTER — Encounter: Payer: Self-pay | Admitting: Cardiovascular Disease

## 2016-12-21 ENCOUNTER — Ambulatory Visit (INDEPENDENT_AMBULATORY_CARE_PROVIDER_SITE_OTHER): Payer: BLUE CROSS/BLUE SHIELD | Admitting: Cardiovascular Disease

## 2016-12-21 VITALS — BP 100/62 | HR 63 | Ht 68.0 in | Wt 198.4 lb

## 2016-12-21 DIAGNOSIS — Z952 Presence of prosthetic heart valve: Secondary | ICD-10-CM | POA: Diagnosis not present

## 2016-12-21 DIAGNOSIS — I7101 Dissection of thoracic aorta: Secondary | ICD-10-CM

## 2016-12-21 DIAGNOSIS — I71019 Dissection of thoracic aorta, unspecified: Secondary | ICD-10-CM

## 2016-12-21 LAB — BASIC METABOLIC PANEL
BUN / CREAT RATIO: 15 (ref 10–24)
BUN: 24 mg/dL (ref 8–27)
CALCIUM: 9 mg/dL (ref 8.6–10.2)
CO2: 23 mmol/L (ref 18–29)
Chloride: 108 mmol/L — ABNORMAL HIGH (ref 96–106)
Creatinine, Ser: 1.57 mg/dL — ABNORMAL HIGH (ref 0.76–1.27)
GFR, EST AFRICAN AMERICAN: 54 mL/min/{1.73_m2} — AB (ref >59)
GFR, EST NON AFRICAN AMERICAN: 47 mL/min/{1.73_m2} — AB (ref >59)
Glucose: 85 mg/dL (ref 65–99)
POTASSIUM: 4.5 mmol/L (ref 3.5–5.2)
SODIUM: 144 mmol/L (ref 134–144)

## 2016-12-21 NOTE — Patient Instructions (Signed)
Medication Instructions:  Your physician recommends that you continue on your current medications as directed. Please refer to the Current Medication list given to you today.   Labwork: Lab work to be done today--BMP  Testing/Procedures: Non-Cardiac CT scanning, (CAT scanning), is a noninvasive, special x-ray that produces cross-sectional images of the body using x-rays and a computer. CT scans help physicians diagnose and treat medical conditions. For some CT exams, a contrast material is used to enhance visibility in the area of the body being studied. CT scans provide greater clarity and reveal more details than regular x-ray exams.--Chest, Abdomen and Pelvis CT    Follow-Up: Your physician recommends that you schedule a follow-up appointment in: 12 months. Please call our office in about 9 months to schedule this appointment    Any Other Special Instructions Will Be Listed Below (If Applicable).     If you need a refill on your cardiac medications before your next appointment, please call your pharmacy.

## 2016-12-28 DIAGNOSIS — Z4889 Encounter for other specified surgical aftercare: Secondary | ICD-10-CM | POA: Diagnosis not present

## 2016-12-28 DIAGNOSIS — Z9621 Cochlear implant status: Secondary | ICD-10-CM | POA: Diagnosis not present

## 2016-12-28 DIAGNOSIS — Z6829 Body mass index (BMI) 29.0-29.9, adult: Secondary | ICD-10-CM | POA: Diagnosis not present

## 2016-12-28 DIAGNOSIS — H9042 Sensorineural hearing loss, unilateral, left ear, with unrestricted hearing on the contralateral side: Secondary | ICD-10-CM | POA: Diagnosis not present

## 2017-01-03 ENCOUNTER — Other Ambulatory Visit: Payer: Self-pay | Admitting: *Deleted

## 2017-01-03 ENCOUNTER — Ambulatory Visit
Admission: RE | Admit: 2017-01-03 | Discharge: 2017-01-03 | Disposition: A | Discharge status: Home / Self Care | Payer: Self-pay | Source: Ambulatory Visit | Attending: Cardiovascular Disease | Admitting: Cardiovascular Disease

## 2017-01-03 ENCOUNTER — Other Ambulatory Visit: Payer: Self-pay | Admitting: Cardiovascular Disease

## 2017-01-03 DIAGNOSIS — I712 Thoracic aortic aneurysm, without rupture, unspecified: Secondary | ICD-10-CM

## 2017-01-12 ENCOUNTER — Ambulatory Visit (INDEPENDENT_AMBULATORY_CARE_PROVIDER_SITE_OTHER)
Admission: RE | Admit: 2017-01-12 | Discharge: 2017-01-12 | Disposition: A | Discharge status: Home / Self Care | Payer: BLUE CROSS/BLUE SHIELD | Source: Ambulatory Visit | Attending: Cardiovascular Disease | Admitting: Cardiovascular Disease

## 2017-01-12 ENCOUNTER — Ambulatory Visit (INDEPENDENT_AMBULATORY_CARE_PROVIDER_SITE_OTHER): Payer: BLUE CROSS/BLUE SHIELD | Admitting: General Practice

## 2017-01-12 DIAGNOSIS — I7101 Dissection of thoracic aorta: Secondary | ICD-10-CM

## 2017-01-12 DIAGNOSIS — Z5181 Encounter for therapeutic drug level monitoring: Secondary | ICD-10-CM

## 2017-01-12 DIAGNOSIS — I71019 Dissection of thoracic aorta, unspecified: Secondary | ICD-10-CM

## 2017-01-12 DIAGNOSIS — Z952 Presence of prosthetic heart valve: Secondary | ICD-10-CM

## 2017-01-12 LAB — POCT INR: INR: 2.5

## 2017-01-12 MED ORDER — IOPAMIDOL (ISOVUE-370) INJECTION 76%
100.0000 mL | Freq: Once | INTRAVENOUS | Status: AC | PRN
Start: 2017-01-12 — End: 2017-01-12
  Administered 2017-01-12: 100 mL via INTRAVENOUS

## 2017-01-12 NOTE — Patient Instructions (Signed)
Pre visit review using our clinic review tool, if applicable. No additional management support is needed unless otherwise documented below in the visit note. 

## 2017-01-24 DIAGNOSIS — Z952 Presence of prosthetic heart valve: Secondary | ICD-10-CM | POA: Diagnosis not present

## 2017-01-24 DIAGNOSIS — Z87891 Personal history of nicotine dependence: Secondary | ICD-10-CM | POA: Diagnosis not present

## 2017-01-24 DIAGNOSIS — I509 Heart failure, unspecified: Secondary | ICD-10-CM | POA: Diagnosis not present

## 2017-01-24 DIAGNOSIS — I252 Old myocardial infarction: Secondary | ICD-10-CM | POA: Diagnosis not present

## 2017-01-24 DIAGNOSIS — I110 Hypertensive heart disease with heart failure: Secondary | ICD-10-CM | POA: Diagnosis not present

## 2017-01-24 DIAGNOSIS — I711 Thoracic aortic aneurysm, ruptured: Secondary | ICD-10-CM | POA: Diagnosis not present

## 2017-01-24 DIAGNOSIS — Z888 Allergy status to other drugs, medicaments and biological substances status: Secondary | ICD-10-CM | POA: Diagnosis not present

## 2017-02-16 ENCOUNTER — Ambulatory Visit (INDEPENDENT_AMBULATORY_CARE_PROVIDER_SITE_OTHER): Payer: BLUE CROSS/BLUE SHIELD | Admitting: General Practice

## 2017-02-16 DIAGNOSIS — Z5181 Encounter for therapeutic drug level monitoring: Secondary | ICD-10-CM

## 2017-02-16 DIAGNOSIS — Z952 Presence of prosthetic heart valve: Secondary | ICD-10-CM

## 2017-02-16 LAB — POCT INR: INR: 1.8

## 2017-02-16 NOTE — Patient Instructions (Signed)
Pre visit review using our clinic review tool, if applicable. No additional management support is needed unless otherwise documented below in the visit note. 

## 2017-02-16 NOTE — Progress Notes (Signed)
I have reviewed and agree with the plan. 

## 2017-03-16 ENCOUNTER — Ambulatory Visit (INDEPENDENT_AMBULATORY_CARE_PROVIDER_SITE_OTHER): Payer: BLUE CROSS/BLUE SHIELD | Admitting: General Practice

## 2017-03-16 DIAGNOSIS — Z952 Presence of prosthetic heart valve: Secondary | ICD-10-CM

## 2017-03-16 DIAGNOSIS — Z5181 Encounter for therapeutic drug level monitoring: Secondary | ICD-10-CM | POA: Diagnosis not present

## 2017-03-16 LAB — POCT INR: INR: 2.4

## 2017-03-16 NOTE — Patient Instructions (Signed)
Pre visit review using our clinic review tool, if applicable. No additional management support is needed unless otherwise documented below in the visit note. 

## 2017-03-16 NOTE — Progress Notes (Signed)
I have reviewed and agree with the plan. 

## 2017-04-11 ENCOUNTER — Ambulatory Visit (INDEPENDENT_AMBULATORY_CARE_PROVIDER_SITE_OTHER): Payer: BLUE CROSS/BLUE SHIELD | Admitting: General Practice

## 2017-04-11 DIAGNOSIS — Z952 Presence of prosthetic heart valve: Secondary | ICD-10-CM

## 2017-04-11 DIAGNOSIS — Z7901 Long term (current) use of anticoagulants: Secondary | ICD-10-CM

## 2017-04-11 DIAGNOSIS — Z5181 Encounter for therapeutic drug level monitoring: Secondary | ICD-10-CM

## 2017-04-11 DIAGNOSIS — Z23 Encounter for immunization: Secondary | ICD-10-CM | POA: Diagnosis not present

## 2017-04-11 LAB — POCT INR: INR: 2.5

## 2017-04-11 NOTE — Patient Instructions (Signed)
Pre visit review using our clinic review tool, if applicable. No additional management support is needed unless otherwise documented below in the visit note. 

## 2017-04-11 NOTE — Progress Notes (Signed)
I have reviewed and agree with the plan. 

## 2017-05-23 ENCOUNTER — Ambulatory Visit (INDEPENDENT_AMBULATORY_CARE_PROVIDER_SITE_OTHER): Payer: BLUE CROSS/BLUE SHIELD | Admitting: General Practice

## 2017-05-23 DIAGNOSIS — Z952 Presence of prosthetic heart valve: Secondary | ICD-10-CM

## 2017-05-23 DIAGNOSIS — Z7901 Long term (current) use of anticoagulants: Secondary | ICD-10-CM

## 2017-05-23 NOTE — Patient Instructions (Signed)
Pre visit review using our clinic review tool, if applicable. No additional management support is needed unless otherwise documented below in the visit note. 

## 2017-06-08 ENCOUNTER — Ambulatory Visit (HOSPITAL_BASED_OUTPATIENT_CLINIC_OR_DEPARTMENT_OTHER): Payer: BLUE CROSS/BLUE SHIELD | Admitting: Oncology

## 2017-06-08 ENCOUNTER — Telehealth: Payer: Self-pay | Admitting: Oncology

## 2017-06-08 VITALS — BP 137/88 | HR 55 | Temp 98.4°F | Resp 20 | Ht 68.0 in | Wt 208.3 lb

## 2017-06-08 DIAGNOSIS — C469 Kaposi's sarcoma, unspecified: Secondary | ICD-10-CM

## 2017-06-08 NOTE — Telephone Encounter (Signed)
Gave avs and calendar for November 2019 °

## 2017-06-08 NOTE — Progress Notes (Signed)
Hematology and Oncology Follow Up Visit  Austin [name] [id number] 1956/03/23 61 y.o. 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 He presented with cutaneous lesions in his lower extremities. He is HIV negative.   Past therapy: Doxil chemotherapy given at 30 mg/m every 4 weeks the first cycle given at [facility] in June 2016. He is status post 6 cycles completed in November 2016.  Current therapy: Observation surveillance.  Interim History:  Austin [name] presents today for a follow-up visit. Since his last visit, he continues to do well without any complaints. He reports no new skin lesions or exacerbation of his old skin lesions. He denied any respiratory symptoms such as cough or hemoptysis. He denied any pruritus or erythema. He reports no thrombosis or bleeding episodes.  His appetite and weight remains unchanged.  She denied any recent hospitalizations or illnesses.   He does not report any headaches, blurry vision, syncope or seizures. He does not report any fevers, chills, sweats or weight loss. He does not report any chest pain,  orthopnea or leg edema. He does not report any cough, wheezing or shortness of breath. He does not report any nausea, vomiting, abdominal pain, hematochezia or. He does not report any frequency, urgency or hesitancy. He does not report any skeletal complaints. Remainder review of systems unremarkable.   Medications: I have reviewed the patient's current medications.  Current Outpatient Medications  Medication Sig Dispense Refill  . Diclofenac Sodium 2 % SOLN Apply 1 pump twice daily. 112 g 3  . fenofibrate 160 MG tablet Take 1 tablet (160 mg total) by mouth daily. 90 tablet 3  . ferrous sulfate 325 (65 FE) MG tablet Take 1 tablet (325 mg total) by mouth daily with breakfast. 30 tablet 3  . losartan (COZAAR) 25 MG tablet Take 1 tablet  (25 mg total) by mouth daily. 90 tablet 3  . metoprolol (TOPROL XL) 200 MG 24 hr tablet Take 1 tablet (200 mg total) by mouth daily. 90 tablet 3  . Multiple Vitamin (MULTIVITAMIN) tablet Take 1 tablet by mouth daily.    . rosuvastatin (CRESTOR) 40 MG tablet Take 1 tablet (40 mg total) by mouth daily. 90 tablet 3  . warfarin (COUMADIN) 5 MG tablet TAKE AS DIRECTED BY ANTICOAGULATION CLINIC 135 tablet 1   No current facility-administered medications for this visit.      Allergies:  Allergies  Allergen Reactions  . Celecoxib Itching and Other (See Comments)    Joint pain    Past Medical History, Surgical history, Social history, and Family History were reviewed and updated.   Physical Exam: Blood pressure 137/88, pulse (!) 55, temperature 98.4 F (36.9 C), temperature source Oral, resp. rate 20, height 5\' 8"  (1.727 m), weight 208 lb 4.8 oz (94.5 kg), SpO2 100 %. ECOG: 0 General appearance: Alert, awake gentleman without distress. Head: Normocephalic, without obvious abnormality no oral ulcers or masses. Neck: no adenopathy Lymph nodes: Cervical, supraclavicular, and axillary nodes normal. Heart:regular rate and rhythm, S1, S2 normal, no murmur, click, rub or gallop Lung:chest clear, no wheezing, rales, normal symmetric air entry Abdomin: soft, non-tender, without masses or organomegaly no rebound or guarding. EXT: Scaly raised lower extremity lesions noted on both legs.  Unchanged from previous examination.   Lab Results: Lab Results  Component Value Date   WBC 5.2 11/03/2016   HGB 15.4 11/03/2016   HCT 45.4 11/03/2016  MCV 85.7 11/03/2016   PLT 158 11/03/2016     Chemistry      Component Value Date/Time   NA 144 12/21/2016 0839   NA 143 11/03/2016 0908   K 4.5 12/21/2016 0839   K 5.0 11/03/2016 0908   CL 108 (H) 12/21/2016 0839   CO2 23 12/21/2016 0839   CO2 27 11/03/2016 0908   BUN 24 12/21/2016 0839   BUN 33.5 (H) 11/03/2016 0908   CREATININE 1.57 (H)  12/21/2016 0839   CREATININE 1.5 (H) 11/03/2016 0908      Component Value Date/Time   CALCIUM 9.0 12/21/2016 0839   CALCIUM 9.5 11/03/2016 0908   ALKPHOS 25 (L) 11/03/2016 0908   AST 29 11/03/2016 0908   ALT 29 11/03/2016 0908   BILITOT 0.70 11/03/2016 0908       Impression and Plan:   61 year old gentleman with the following issues:  1. Kaposi sarcoma diagnosed in April 2016 after presenting with a cutaneous raised lesion in the right lower leg. 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 He was evaluated by Dr. Kendall Flack at [facility] in his HIV testing was negative. He felt that he might have sporadic case of Kaposi's sarcoma rather than HIV related case.   He completed 6 cycles of Doxil with near resolution of his Kaposi's lesions in 2016.  He continues to have excellent response to therapy without any relapse of recurrent disease. The plan is to continue with active surveillance and institute Doxil again upon relapse.   2. Cardiomyopathy prophylaxis: He did have an echocardiogram on 12/10/2014 done at [facility] and showed a preserved LV function of 55-60%. Future echocardiogram ordered by his cardiologist.   3. Follow-up: Will be in 12 months sooner if needed 2.  Austin Button, MD 11/9/201812:19 PM

## 2017-06-23 ENCOUNTER — Other Ambulatory Visit: Payer: Self-pay | Admitting: Internal Medicine

## 2017-06-26 ENCOUNTER — Other Ambulatory Visit: Payer: Self-pay | Admitting: General Practice

## 2017-06-26 MED ORDER — WARFARIN SODIUM 5 MG PO TABS: ORAL_TABLET | ORAL | 0 refills | Status: DC

## 2017-07-17 ENCOUNTER — Ambulatory Visit (INDEPENDENT_AMBULATORY_CARE_PROVIDER_SITE_OTHER): Payer: BLUE CROSS/BLUE SHIELD | Admitting: General Practice

## 2017-07-17 DIAGNOSIS — Z7901 Long term (current) use of anticoagulants: Secondary | ICD-10-CM | POA: Diagnosis not present

## 2017-07-17 DIAGNOSIS — Z952 Presence of prosthetic heart valve: Secondary | ICD-10-CM

## 2017-07-17 LAB — POCT INR: INR: 2.6

## 2017-07-17 NOTE — Patient Instructions (Addendum)
Pre visit review using our clinic review tool, if applicable. No additional management support is needed unless otherwise documented below in the visit note.  Continue to take 1 tablet daily and 1 1/2 tablets every Wednesday.  Re-check in 6 weeks at patient request.

## 2017-08-14 DIAGNOSIS — C469 Kaposi's sarcoma, unspecified: Secondary | ICD-10-CM | POA: Diagnosis not present

## 2017-08-14 DIAGNOSIS — Z952 Presence of prosthetic heart valve: Secondary | ICD-10-CM | POA: Diagnosis not present

## 2017-08-14 DIAGNOSIS — Z9889 Other specified postprocedural states: Secondary | ICD-10-CM | POA: Diagnosis not present

## 2017-08-14 DIAGNOSIS — N183 Chronic kidney disease, stage 3 (moderate): Secondary | ICD-10-CM | POA: Diagnosis not present

## 2017-08-16 ENCOUNTER — Encounter: Payer: Self-pay | Admitting: *Deleted

## 2017-08-31 ENCOUNTER — Ambulatory Visit: Payer: Self-pay | Admitting: General Practice

## 2017-08-31 ENCOUNTER — Ambulatory Visit (INDEPENDENT_AMBULATORY_CARE_PROVIDER_SITE_OTHER): Payer: BLUE CROSS/BLUE SHIELD | Admitting: General Practice

## 2017-08-31 DIAGNOSIS — Z7901 Long term (current) use of anticoagulants: Secondary | ICD-10-CM

## 2017-08-31 DIAGNOSIS — Z952 Presence of prosthetic heart valve: Secondary | ICD-10-CM

## 2017-08-31 LAB — POCT INR: INR: 2.6

## 2017-08-31 NOTE — Patient Instructions (Addendum)
Pre visit review using our clinic review tool, if applicable. No additional management support is needed unless otherwise documented below in the visit note.  Continue to take 1 tablet daily and 1 1/2 tablets every Wednesday.  Re-check in 6 weeks at patient request.

## 2017-09-07 ENCOUNTER — Ambulatory Visit (INDEPENDENT_AMBULATORY_CARE_PROVIDER_SITE_OTHER): Payer: BLUE CROSS/BLUE SHIELD | Admitting: Internal Medicine

## 2017-09-07 ENCOUNTER — Other Ambulatory Visit (INDEPENDENT_AMBULATORY_CARE_PROVIDER_SITE_OTHER): Payer: BLUE CROSS/BLUE SHIELD

## 2017-09-07 ENCOUNTER — Encounter: Payer: Self-pay | Admitting: Internal Medicine

## 2017-09-07 VITALS — BP 118/78 | HR 64 | Temp 98.5°F | Ht 68.0 in | Wt 207.0 lb

## 2017-09-07 DIAGNOSIS — R739 Hyperglycemia, unspecified: Secondary | ICD-10-CM

## 2017-09-07 DIAGNOSIS — Z0000 Encounter for general adult medical examination without abnormal findings: Secondary | ICD-10-CM

## 2017-09-07 DIAGNOSIS — R972 Elevated prostate specific antigen [PSA]: Secondary | ICD-10-CM

## 2017-09-07 LAB — CBC WITH DIFFERENTIAL/PLATELET
BASOS PCT: 0.5 %
Basophils Absolute: 21 cells/uL (ref 0–200)
Eosinophils Absolute: 82 cells/uL (ref 15–500)
Eosinophils Relative: 2 %
HCT: 47.8 % (ref 38.5–50.0)
Hemoglobin: 16.1 g/dL (ref 13.2–17.1)
Lymphs Abs: 1173 cells/uL (ref 850–3900)
MCH: 28.4 pg (ref 27.0–33.0)
MCHC: 33.7 g/dL (ref 32.0–36.0)
MCV: 84.5 fL (ref 80.0–100.0)
MONOS PCT: 10.4 %
MPV: 11.6 fL (ref 7.5–12.5)
NEUTROS PCT: 58.5 %
Neutro Abs: 2399 cells/uL (ref 1500–7800)
PLATELETS: 158 10*3/uL (ref 140–400)
RBC: 5.66 10*6/uL (ref 4.20–5.80)
RDW: 13.3 % (ref 11.0–15.0)
TOTAL LYMPHOCYTE: 28.6 %
WBC mixed population: 426 cells/uL (ref 200–950)
WBC: 4.1 10*3/uL (ref 3.8–10.8)

## 2017-09-07 LAB — LIPID PANEL
CHOL/HDL RATIO: 4
Cholesterol: 165 mg/dL (ref 0–200)
HDL: 39.4 mg/dL (ref >39.00)
LDL CALC: 103 mg/dL — AB (ref 0–99)
NonHDL: 125.76
Triglycerides: 113 mg/dL (ref 0.0–149.0)
VLDL: 22.6 mg/dL (ref 0.0–40.0)

## 2017-09-07 LAB — BASIC METABOLIC PANEL
BUN: 28 mg/dL — AB (ref 6–23)
CHLORIDE: 108 meq/L (ref 96–112)
CO2: 29 meq/L (ref 19–32)
CREATININE: 1.34 mg/dL (ref 0.40–1.50)
Calcium: 9.4 mg/dL (ref 8.4–10.5)
GFR: 69.38 mL/min (ref >60.00)
Glucose, Bld: 120 mg/dL — ABNORMAL HIGH (ref 70–99)
Potassium: 4.6 mEq/L (ref 3.5–5.1)
Sodium: 142 mEq/L (ref 135–145)

## 2017-09-07 LAB — TSH: TSH: 2.81 u[IU]/mL (ref 0.35–4.50)

## 2017-09-07 LAB — URINALYSIS, ROUTINE W REFLEX MICROSCOPIC
Bilirubin Urine: NEGATIVE
HGB URINE DIPSTICK: NEGATIVE
Ketones, ur: NEGATIVE
LEUKOCYTES UA: NEGATIVE
NITRITE: NEGATIVE
RBC / HPF: NONE SEEN (ref 0–?)
Specific Gravity, Urine: 1.015 (ref 1.000–1.030)
Total Protein, Urine: NEGATIVE
Urine Glucose: NEGATIVE
Urobilinogen, UA: 0.2 (ref 0.0–1.0)
pH: 7.5 (ref 5.0–8.0)

## 2017-09-07 LAB — HEPATIC FUNCTION PANEL
ALK PHOS: 20 U/L — AB (ref 39–117)
ALT: 26 U/L (ref 0–53)
AST: 26 U/L (ref 0–37)
Albumin: 4.4 g/dL (ref 3.5–5.2)
BILIRUBIN DIRECT: 0.2 mg/dL (ref 0.0–0.3)
Total Bilirubin: 0.8 mg/dL (ref 0.2–1.2)
Total Protein: 6.4 g/dL (ref 6.0–8.3)

## 2017-09-07 LAB — VITAMIN D 25 HYDROXY (VIT D DEFICIENCY, FRACTURES): VITD: 51.35 ng/mL (ref 30.00–100.00)

## 2017-09-07 LAB — HEMOGLOBIN A1C: HEMOGLOBIN A1C: 5.9 % (ref 4.6–6.5)

## 2017-09-07 LAB — PSA: PSA: 1.69 ng/mL (ref 0.10–4.00)

## 2017-09-07 MED ORDER — ZOSTER VAC RECOMB ADJUVANTED 50 MCG/0.5ML IM SUSR: 0.5000 mL | Freq: Once | INTRAMUSCULAR | 1 refills | Status: AC

## 2017-09-07 NOTE — Patient Instructions (Addendum)
Shiron will add you on for the Cologuard  Your Shingles shot was sent to the pharmacy  Please continue all other medications as before, and refills have been done if requested.  Please have the pharmacy call with any other refills you may need.  Please continue your efforts at being more active, low cholesterol diet, and weight control.  You are otherwise up to date with prevention measures today.  Please keep your appointments with your specialists as you may have planned  Please go to the LAB in the Basement (turn left off the elevator) for the tests to be done today  You will be contacted by phone if any changes need to be made immediately.  Otherwise, you will receive a letter about your results with an explanation, but please check with MyChart first.  Please remember to sign up for MyChart if you have not done so, as this will be important to you in the future with finding out test results, communicating by private email, and scheduling acute appointments online when needed.  Please return in 1 year for your yearly visit, or sooner if needed, with Lab testing done 3-5 days before

## 2017-09-07 NOTE — Progress Notes (Signed)
Subjective:    Patient ID: Austin [name], male    DOB: 11-09-1955, 62 y.o.   MRN: [id number]  HPI  Here for wellness and f/u;  Overall doing ok;  Pt denies Chest pain, worsening SOB, DOE, wheezing, orthopnea, PND, worsening LE edema, palpitations, dizziness or syncope.  Pt denies neurological change such as new headache, facial or extremity weakness.  Pt denies polydipsia, polyuria, or low sugar symptoms. Pt states overall good compliance with treatment and medications, good tolerability, and has been trying to follow appropriate diet.  Pt denies worsening depressive symptoms, suicidal ideation or panic. No fever, night sweats, wt loss, loss of appetite, or other constitutional symptoms.  Pt states good ability with ADL's, has low fall risk, home safety reviewed and adequate, no other significant changes in hearing or vision, and only occasionally active with exercise. Wt Readings from Last 3 Encounters:  09/07/17 207 lb (93.9 kg)  06/08/17 208 lb 4.8 oz (94.5 kg)  12/21/16 198 lb 6.4 oz (90 kg)   Past Medical History:  Diagnosis Date  . Anemia    iron def  . Aortic valve prosthesis present    a. SJM - [facility] 2003, chronic coumadin.  . Back pain   . Coronary artery disease   . Family history of melanoma   . Hearing loss   . Hyperlipidemia   . Hypertension   . Internal hemorrhoid   . Kaposi sarcoma ([place])   . Obesity   . Pericardial effusion   . Renal insufficiency 05/16/2011  . Thoracic aortic aneurysm [facility])    a. s/p dissection and repair @ [facility] 2003 with SJM AVR;  CTA 09/2010 desc thor Ao 5.2 x 5.3-5.5 x 5.5 cm.  . Thyroid disease    Past Surgical History:  Procedure Laterality Date  . ABDOMINAL AORTIC ANEURYSM REPAIR    . AORTIC VALVE REPLACEMENT    . impingment     right shoulder  . KNEE ARTHROSCOPY     left  . WISDOM TOOTH EXTRACTION      reports that he quit smoking about 15 years ago. His smoking use included cigarettes. He has a 15.00 pack-year smoking history. he  has never used smokeless tobacco. He reports that he drinks alcohol. He reports that he does not use drugs. family history includes Heart disease in his paternal aunt; Liver disease in his sister; Melanoma in his maternal uncle; Other in his maternal grandfather; Stroke in his mother; Stroke (age of onset: 24) in his father. Allergies  Allergen Reactions  . Celecoxib Itching and Other (See Comments)    Joint pain   Current Outpatient Medications on File Prior to Visit  Medication Sig Dispense Refill  . Diclofenac Sodium 2 % SOLN Apply 1 pump twice daily. 112 g 3  . fenofibrate 160 MG tablet Take 1 tablet (160 mg total) by mouth daily. 90 tablet 3  . ferrous sulfate 325 (65 FE) MG tablet Take 1 tablet (325 mg total) by mouth daily with breakfast. 30 tablet 3  . losartan (COZAAR) 25 MG tablet Take 1 tablet (25 mg total) by mouth daily. 90 tablet 3  . metoprolol (TOPROL XL) 200 MG 24 hr tablet Take 1 tablet (200 mg total) by mouth daily. 90 tablet 3  . Multiple Vitamin (MULTIVITAMIN) tablet Take 1 tablet by mouth daily.    . rosuvastatin (CRESTOR) 40 MG tablet Take 1 tablet (40 mg total) by mouth daily. 90 tablet 3  . warfarin (COUMADIN) 5 MG tablet TAKE AS  DIRECTED BY ANTICOAGULATION CLINIC - 90 day supply 120 tablet 0   No current facility-administered medications on file prior to visit.    Review of Systems Constitutional: Negative for other unusual diaphoresis, sweats, appetite or weight changes HENT: Negative for other worsening hearing loss, ear pain, facial swelling, mouth sores or neck stiffness.   Eyes: Negative for other worsening pain, redness or other visual disturbance.  Respiratory: Negative for other stridor or swelling Cardiovascular: Negative for other palpitations or other chest pain  Gastrointestinal: Negative for worsening diarrhea or loose stools, blood in stool, distention or other pain Genitourinary: Negative for hematuria, flank pain or other change in urine volume.    Musculoskeletal: Negative for myalgias or other joint swelling.  Skin: Negative for other color change, or other wound or worsening drainage.  Neurological: Negative for other syncope or numbness. Hematological: Negative for other adenopathy or swelling Psychiatric/Behavioral: Negative for hallucinations, other worsening agitation, SI, self-injury, or new decreased concentration \\All  other system neg per pt    Objective:   Physical Exam BP 118/78   Pulse 64   Temp 98.5 F (36.9 C) (Oral)   Ht 5\' 8"  (1.727 m)   Wt 207 lb (93.9 kg)   SpO2 98%   BMI 31.47 kg/m  VS noted,  Constitutional: Pt is oriented to person, place, and time. Appears well-developed and well-nourished, in no significant distress and comfortable Head: Normocephalic and atraumatic  Eyes: Conjunctivae and EOM are normal. Pupils are equal, round, and reactive to light Right Ear: External ear normal without discharge Left Ear: External ear normal without discharge Nose: Nose without discharge or deformity Mouth/Throat: Oropharynx is without other ulcerations and moist  Neck: Normal range of motion. Neck supple. No JVD present. No tracheal deviation present or significant neck LA or mass Cardiovascular: Normal rate, regular rhythm, normal heart sounds and intact distal pulses.   Pulmonary/Chest: WOB normal and breath sounds without rales or wheezing  Abdominal: Soft. Bowel sounds are normal. NT. No HSM  Musculoskeletal: Normal range of motion. Exhibits no edema Lymphadenopathy: Has no other cervical adenopathy.  Neurological: Pt is alert and oriented to person, place, and time. Pt has normal reflexes. No cranial nerve deficit. Motor grossly intact, Gait intact Skin: Skin is warm and dry. No rash noted or new ulcerations Psychiatric:  Has normal mood and affect. Behavior is normal without agitation] No other exam findings    Assessment & Plan:

## 2017-09-07 NOTE — Assessment & Plan Note (Signed)
Lab Results  Component Value Date   HGBA1C 5.9 09/07/2017  stable overall by history and exam, recent data reviewed with pt, and pt to continue medical treatment as before,  to f/u any worsening symptoms or concerns

## 2017-09-07 NOTE — Assessment & Plan Note (Signed)
For f/u psa today,  to f/u any worsening symptoms or concerns

## 2017-09-07 NOTE — Assessment & Plan Note (Signed)

## 2017-09-20 ENCOUNTER — Other Ambulatory Visit: Payer: Self-pay | Admitting: Internal Medicine

## 2017-10-02 DIAGNOSIS — Z1212 Encounter for screening for malignant neoplasm of rectum: Secondary | ICD-10-CM | POA: Diagnosis not present

## 2017-10-02 DIAGNOSIS — Z1211 Encounter for screening for malignant neoplasm of colon: Secondary | ICD-10-CM | POA: Diagnosis not present

## 2017-10-06 ENCOUNTER — Other Ambulatory Visit: Payer: Self-pay | Admitting: Internal Medicine

## 2017-10-10 LAB — COLOGUARD: Cologuard: NEGATIVE

## 2017-10-12 ENCOUNTER — Ambulatory Visit (INDEPENDENT_AMBULATORY_CARE_PROVIDER_SITE_OTHER): Payer: BLUE CROSS/BLUE SHIELD | Admitting: General Practice

## 2017-10-12 DIAGNOSIS — Z7901 Long term (current) use of anticoagulants: Secondary | ICD-10-CM | POA: Diagnosis not present

## 2017-10-12 DIAGNOSIS — Z952 Presence of prosthetic heart valve: Secondary | ICD-10-CM

## 2017-10-12 LAB — POCT INR: INR: 2.5

## 2017-10-12 NOTE — Patient Instructions (Addendum)
Pre visit review using our clinic review tool, if applicable. No additional management support is needed unless otherwise documented below in the visit note.  Continue to take 1 tablet daily and 1 1/2 tablets every Wednesday.  Re-check in 6 weeks at patient request

## 2017-10-14 NOTE — Progress Notes (Signed)
Agree with management.  Austin Amaro J Shantavia Jha, MD  

## 2017-11-23 ENCOUNTER — Ambulatory Visit (INDEPENDENT_AMBULATORY_CARE_PROVIDER_SITE_OTHER): Payer: BLUE CROSS/BLUE SHIELD | Admitting: General Practice

## 2017-11-23 DIAGNOSIS — Z7901 Long term (current) use of anticoagulants: Secondary | ICD-10-CM | POA: Diagnosis not present

## 2017-11-23 DIAGNOSIS — Z952 Presence of prosthetic heart valve: Secondary | ICD-10-CM

## 2017-11-23 LAB — POCT INR: INR: 3

## 2017-11-23 NOTE — Patient Instructions (Addendum)
Pre visit review using our clinic review tool, if applicable. No additional management support is needed unless otherwise documented below in the visit note.  Continue to take 1 tablet daily and 1 1/2 tablets every Wednesday.  Re-check in 6 weeks at patient request.

## 2017-12-01 ENCOUNTER — Other Ambulatory Visit: Payer: Self-pay | Admitting: Internal Medicine

## 2017-12-21 ENCOUNTER — Telehealth: Payer: Self-pay | Admitting: Internal Medicine

## 2017-12-21 MED ORDER — FENOFIBRATE 160 MG PO TABS: 160.0000 mg | ORAL_TABLET | Freq: Every day | ORAL | 2 refills | Status: DC

## 2017-12-21 NOTE — Telephone Encounter (Signed)
Reviewed chart pt is up-to-date [facility].../lmb  

## 2017-12-21 NOTE — Telephone Encounter (Signed)
Copied from [place] [id number]. Topic: Quick Communication - Rx Refill/Question >> Dec 21, 2017 10:21 AM Ether Griffins B wrote: Medication: fenofibrate 160 MG tablet (pt is out of the medication) [facility] a refill at the first of May.   Please send a 30 day supply as well to [place], [address] AT [address] [place]  Has the patient contacted their pharmacy? Yes.   (Agent: If no, request that the patient contact the pharmacy for the refill.) (Agent: If yes, when and what did the pharmacy advise?)  Preferred Pharmacy (with phone number or street name): [place], [place]: Please be advised that RX refills may take up to 3 business days. We ask that you follow-up with your pharmacy.

## 2017-12-21 NOTE — Telephone Encounter (Signed)
LOV  09/07/17 Dr. Jenny Reichmann [facility] 10[facility]05[facility]17  #90 with 3 refills

## 2017-12-25 ENCOUNTER — Encounter: Payer: Self-pay | Admitting: Physician Assistant

## 2017-12-25 NOTE — Progress Notes (Deleted)
Cardiology Office Note    Date:  12/25/2017  ID:  Austin [name], DOB 08-Oct-1955, MRN [id number] PCP:  [name], MD  Cardiologist:  Lauree Chandler, MD   Chief Complaint: f/u dissection, valve replacement  History of Present Illness:  Austin [name] is a 62 y.o. male with history of HTN, HLD, ascending aortic aneursym/dissection with repair at [facility] in 2003 with [employer] aortic valve prosthesis on chronic Coumadin therapy, 3.3cm AAA, Kaposi sarcoma (HIV negative), CKD III per labs here today for cardiac follow up. His surgery was at [facility] in 2003. He has had a residual thoracic and abdominal aortic dissection since then. Imaging showed large dissection extending from ascending aorta down into the abdominal aorta and iliacs. The false lumen in the descending thoracic aortic aneurysm is larger than the true lumen. Per Dr. Camillia Herter last note, this has been stable for years and CT surgery did not think surgical correction is indicated. There is some involvement of the arch vessels and renal arteries. He was also found to have a right lung nodule and lesions in the liver and kidneys that were hard to characterize.  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 Last CT of the chest showed findings below, felt to be stable per Dr. Camillia Herter review (stable type A aortic dissection, stable proximal descending thoracic aortic aneurysm 5.4cm, extension of dissection into abdominal aorta and iliac arteries, 3.3cm suprarenal abdominal aortic aneurysm). 2D Echo 12/2016 showed normal LV, EF 64%, mechanical aortic valve with mild stenosis, peak gradient 42mmHg and mean 83mmHg, normal RV, ascending aorta appears to be dilated likely related to persistent dissection, no pericardial effusion, no sig change from prior. INR is followed in primary care. Last labs 08/2017 showed normal CBC, LDL 103, K 4.6, BUN 28, Cr 1.34 (previously 1.5 in 2018). Nursing staff remotely entered CAD  in [place] but I cannot find any details about this.  QOY scanning inr family hx aneurysm notes  Thoracic aortic dissection S/p aortic valve replacement AAA Essential HTN Hyperlipidemia    Past Medical History:  Diagnosis Date  . Anemia    iron def  . Aortic valve prosthesis present    a. SJM - [facility] 2003, chronic coumadin.  . Back pain   . CKD (chronic kidney disease), stage III ([place])   . Coronary artery disease   . Family history of melanoma   . Hearing loss   . Hyperlipidemia   . Hypertension   . Internal hemorrhoid   . Kaposi sarcoma ([place])   . Obesity   . Pericardial effusion   . Thoracic aortic aneurysm [facility])    a. s/p dissection and repair @ [facility] 2003 with SJM AVR with residual findings followed by CT.  Marland Kitchen Thyroid disease     Past Surgical History:  Procedure Laterality Date  . ABDOMINAL AORTIC ANEURYSM REPAIR    . AORTIC VALVE REPLACEMENT    . impingment     right shoulder  . KNEE ARTHROSCOPY     left  . WISDOM TOOTH EXTRACTION      Current Medications: No outpatient medications have been marked as taking for the 12/26/17 encounter (Appointment) with Charlie Pitter, PA-C.   ***   Allergies:   Celecoxib   Social History   Socioeconomic History  . Marital status: Single    Spouse name: Not on file  . Number of children: 0  . Years of education: MDIV  . Highest education level: Not  on file  Occupational History  . Occupation: Editor, commissioning    Employer: [employer]  . Financial resource strain: Not on file  . Food insecurity:    Worry: Not on file    Inability: Not on file  . Transportation needs:    Medical: Not on file    Non-medical: Not on file  Tobacco Use  . Smoking status: Former Smoker    Packs/day: 1.00    Years: 15.00    Pack years: 15.00    Types: Cigarettes    Last attempt to quit: 10/10/2001    Years since quitting: 16.2  . Smokeless tobacco: Never Used  Substance and Sexual Activity  . Alcohol use: Yes     Comment: one drink per day  . Drug use: No  . Sexual activity: Not Currently  Lifestyle  . Physical activity:    Days per week: Not on file    Minutes per session: Not on file  . Stress: Not on file  Relationships  . Social connections:    Talks on phone: Not on file    Gets together: Not on file    Attends religious service: Not on file    Active member of club or organization: Not on file    Attends meetings of clubs or organizations: Not on file    Relationship status: Not on file  Other Topics Concern  . Not on file  Social History Narrative   Pt lives at home alone.   Caffeine Use: Less than 1 cup daily.     Family History:  The patient's ***family history includes Heart disease in his paternal aunt; Liver disease in his sister; Melanoma in his maternal uncle; Other in his maternal grandfather; Stroke in his mother; Stroke (age of onset: 60) in his father. There is no history of Colon cancer.  ROS:   Please see the history of present illness. Otherwise, review of systems is positive for ***.  All other systems are reviewed and otherwise negative.    PHYSICAL EXAM:   VS:  There were no vitals taken for this visit.  BMI: There is no height or weight on file to calculate BMI. GEN: Well nourished, well developed, in no acute distress HEENT: normocephalic, atraumatic Neck: no JVD, carotid bruits, or masses Cardiac: ***RRR; no murmurs, rubs, or gallops, no edema  Respiratory:  clear to auscultation bilaterally, normal work of breathing GI: soft, nontender, nondistended, + BS MS: no deformity or atrophy Skin: warm and dry, no rash Neuro:  Alert and Oriented x 3, Strength and sensation are intact, follows commands Psych: euthymic mood, full affect  Wt Readings from Last 3 Encounters:  09/07/17 207 lb (93.9 kg)  06/08/17 208 lb 4.8 oz (94.5 kg)  12/21/16 198 lb 6.4 oz (90 kg)      Studies/Labs Reviewed:   EKG:  EKG was ordered today and personally reviewed by me and  demonstrates *** EKG was not ordered today.***  Recent Labs: 09/07/2017: ALT 26; BUN 28; Creatinine, Ser 1.34; Hemoglobin 16.1; Platelets 158; Potassium 4.6; Sodium 142; TSH 2.81   Lipid Panel    Component Value Date/Time   CHOL 165 09/07/2017 1027   TRIG 113.0 09/07/2017 1027   HDL 39.40 09/07/2017 1027   CHOLHDL 4 09/07/2017 1027   VLDL 22.6 09/07/2017 1027   LDLCALC 103 (H) 09/07/2017 1027   LDLDIRECT 114.0 09/23/2014 0955    Additional studies/ records that were reviewed today include: Summarized above.***    ASSESSMENT &  PLAN:   1. ***  Disposition: F/u with ***   Medication Adjustments/Labs and Tests Ordered: Current medicines are reviewed at length with the patient today.  Concerns regarding medicines are outlined above. Medication changes, Labs and Tests ordered today are summarized above and listed in the Patient Instructions accessible in Encounters.   Signed, Charlie Pitter, PA-C  12/25/2017 12:31 PM    [facility] [place], [place],   [address] Phone: [phone]; Fax: [phone]

## 2017-12-26 ENCOUNTER — Ambulatory Visit: Payer: BLUE CROSS/BLUE SHIELD | Admitting: Physician Assistant

## 2018-01-04 ENCOUNTER — Ambulatory Visit: Payer: BLUE CROSS/BLUE SHIELD

## 2018-01-05 ENCOUNTER — Other Ambulatory Visit: Payer: Self-pay | Admitting: Internal Medicine

## 2018-01-07 ENCOUNTER — Other Ambulatory Visit: Payer: Self-pay | Admitting: General Practice

## 2018-01-07 MED ORDER — WARFARIN SODIUM 5 MG PO TABS: ORAL_TABLET | ORAL | 0 refills | Status: DC

## 2018-01-11 ENCOUNTER — Ambulatory Visit (INDEPENDENT_AMBULATORY_CARE_PROVIDER_SITE_OTHER): Payer: BLUE CROSS/BLUE SHIELD | Admitting: General Practice

## 2018-01-11 DIAGNOSIS — Z7901 Long term (current) use of anticoagulants: Secondary | ICD-10-CM | POA: Diagnosis not present

## 2018-01-11 DIAGNOSIS — Z952 Presence of prosthetic heart valve: Secondary | ICD-10-CM

## 2018-01-11 LAB — POCT INR: INR: 2.3 (ref 2.0–3.0)

## 2018-01-11 NOTE — Patient Instructions (Addendum)
Pre visit review using our clinic review tool, if applicable. No additional management support is needed unless otherwise documented below in the visit note.  Continue to take 1 tablet daily and 1 1/2 tablets every Wednesday.  Re-check in 6 weeks at patient request.

## 2018-02-04 DIAGNOSIS — I714 Abdominal aortic aneurysm, without rupture, unspecified: Secondary | ICD-10-CM | POA: Insufficient documentation

## 2018-02-04 NOTE — Progress Notes (Addendum)
Cardiology Office Note    Date:  02/05/2018  ID:  Austin [name], DOB 04-08-1956, MRN [id number] PCP:  [name], MD  Cardiologist:  Lauree Chandler, MD   Chief Complaint: 1 year f/u aneurysm  History of Present Illness:  Austin [name] is a 62 y.o. male with history of HTN, HLD, ascending aortic aneursym/dissection with repair at [facility] in 2003 with [employer] 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   His surgery was at [facility] in 2003. He denies any h/o CAD prior to surgery. He has had a residual thoracic and abdominal aortic dissection since then. Imaging showed large dissection extending from ascending aorta down into the abdominal aorta and iliacs. The false lumen in the descending thoracic aortic aneurysm is larger than the true lumen. Per Dr. Camillia Herter last note, this has been stable for years and CT surgery did not think surgical correction is indicated. There is some involvement of the arch vessels and renal arteries. He was also found to have a right lung nodule and lesions in the liver and kidneys that were hard to characterize. 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 Last CT of the chest in 12/2016 showed findings below, felt to be stable per Dr. Camillia Herter review (stable type A aortic dissection, stable proximal descending thoracic aortic aneurysm 5.4cm, extension of dissection into abdominal aorta and iliac arteries, 3.3cm suprarenal abdominal aortic aneurysm). 2D Echo 12/2016 showed normal LV, EF 64%, mechanical aortic valve with mild stenosis, peak gradient 23mmHg and mean 52mmHg, normal RV, ascending aorta appears to be dilated likely related to persistent dissection, no pericardial effusion, no sig change from prior. This was done at [facility] when he saw a geneticist to evaluate for connective tissue  disorder. INR is followed in primary care. Last labs 08/2017 showed normal CBC, LDL 103, K 4.6, BUN 28, Cr 1.34 (previously 1.5 in 2018). Nursing staff remotely entered CAD in [place] but I cannot find any details about this.   He presents back for annual follow-up and is doing great. He does not formally go to the gym but walks his husky for 45 minutes at a time and has no limiting cardiac symptoms. No CP, SOB, syncope. No bleeding reported. Compliant with meds and tolerating well. BP appears to run on softer side, asymptomatic.    Past Medical History:  Diagnosis Date  . Anemia    iron def  . Aortic valve prosthesis present    a. SJM - [facility] 2003, chronic coumadin.  . Back pain   . CKD (chronic kidney disease), stage II   . Coronary artery disease    not sure where this dx came from - pt denies  . Family history of melanoma   . Hearing loss   . Hyperlipidemia   . Hypertension   . Internal hemorrhoid   . Kaposi sarcoma ([place])   . Obesity   . Pericardial effusion   . Thoracic aortic aneurysm [facility])    a. s/p dissection and repair @ [facility] 2003 with SJM AVR with residual findings followed by CT.  Marland Kitchen Thyroid disease     Past Surgical History:  Procedure Laterality Date  . ABDOMINAL AORTIC ANEURYSM REPAIR    . AORTIC VALVE REPLACEMENT    . impingment     right shoulder  . KNEE ARTHROSCOPY  left  . WISDOM TOOTH EXTRACTION      Current Medications: Current Meds  Medication Sig  . Diclofenac Sodium 2 % SOLN Apply 1 pump twice daily.  . fenofibrate 160 MG tablet Take 1 tablet (160 mg total) by mouth daily.  . ferrous sulfate 325 (65 FE) MG tablet Take 1 tablet (325 mg total) by mouth daily with breakfast.  . losartan (COZAAR) 25 MG tablet TAKE 1 TABLET DAILY  . metoprolol (TOPROL-XL) 200 MG 24 hr tablet TAKE 1 TABLET DAILY  . Multiple Vitamin (MULTIVITAMIN) tablet Take 1 tablet by mouth daily.  . rosuvastatin (CRESTOR) 40 MG tablet TAKE 1 TABLET DAILY  . warfarin (COUMADIN) 5 MG  tablet TAKE AS DIRECTED BY ANTICOAGULATION CLINIC - 90 day supply    Allergies:   Celecoxib   Social History   Socioeconomic History  . Marital status: Single    Spouse name: Not on file  . Number of children: 0  . Years of education: MDIV  . Highest education level: Not on file  Occupational History  . Occupation: Civil Service fast streamer: [employer]  Social Needs  . Financial resource strain: Not on file  . Food insecurity:    Worry: Not on file    Inability: Not on file  . Transportation needs:    Medical: Not on file    Non-medical: Not on file  Tobacco Use  . Smoking status: Former Smoker    Packs/day: 1.00    Years: 15.00    Pack years: 15.00    Types: Cigarettes    Last attempt to quit: 10/10/2001    Years since quitting: 16.3  . Smokeless tobacco: Never Used  Substance and Sexual Activity  . Alcohol use: Yes    Comment: one drink per day  . Drug use: No  . Sexual activity: Not Currently  Lifestyle  . Physical activity:    Days per week: Not on file    Minutes per session: Not on file  . Stress: Not on file  Relationships  . Social connections:    Talks on phone: Not on file    Gets together: Not on file    Attends religious service: Not on file    Active member of club or organization: Not on file    Attends meetings of clubs or organizations: Not on file    Relationship status: Not on file  Other Topics Concern  . Not on file  Social History Narrative   Pt lives at home alone.   Caffeine Use: Less than 1 cup daily.     Family History:  The patient's family history includes Heart disease in his paternal aunt; Liver disease in his sister; Melanoma in his maternal uncle; Other in his maternal grandfather; Stroke in his mother; Stroke (age of onset: 25) in his father. There is no history of Colon cancer.  ROS:   Please see the history of present illness.  All other systems are reviewed and otherwise negative.    PHYSICAL EXAM:   VS:   BP 102/72   Pulse 68   Ht 5\' 8"  (1.727 m)   Wt 218 lb 6.4 oz (99.1 kg)   SpO2 98%   BMI 33.21 kg/m   BMI: Body mass index is 33.21 kg/m. GEN: Well nourished, well developed AAM in no acute distress HEENT: normocephalic, atraumatic Neck: no JVD or masses, transmitted radiation of carotids Cardiac: RRR; crisp valve click with 2/6 SEM at RUSB, no rubs or gallops,  no edema  Respiratory:  clear to auscultation bilaterally, normal work of breathing GI: soft, nontender, nondistended, + BS MS: no deformity or atrophy Skin: warm and dry Neuro:  Alert and Oriented x 3, Strength and sensation are intact, follows commands Psych: euthymic mood, full affect  Wt Readings from Last 3 Encounters:  02/05/18 218 lb 6.4 oz (99.1 kg)  09/07/17 207 lb (93.9 kg)  06/08/17 208 lb 4.8 oz (94.5 kg)      Studies/Labs Reviewed:   EKG:  EKG was ordered today and personally reviewed by me and demonstrates NSR 68bpm, incomplete RBBB with nonspecific ST-T changes, possible LAE. Similar to prior.    Recent Labs: 09/07/2017: ALT 26; BUN 28; Creatinine, Ser 1.34; Hemoglobin 16.1; Platelets 158; Potassium 4.6; Sodium 142; TSH 2.81   Lipid Panel    Component Value Date/Time   CHOL 165 09/07/2017 1027   TRIG 113.0 09/07/2017 1027   HDL 39.40 09/07/2017 1027   CHOLHDL 4 09/07/2017 1027   VLDL 22.6 09/07/2017 1027   LDLCALC 103 (H) 09/07/2017 1027   LDLDIRECT 114.0 09/23/2014 0955    Additional studies/ records that were reviewed today include: Summarized above   ASSESSMENT & PLAN:   1. Thoracic aortic dissection, descending aortic aneurysm, and AAA - clinically stable. I will review repeat timing of scan with Dr. Angelena Form given residual disease. He has some concerns about cost of annual screening. We reviewed physical activity limitations (heavy lifting >30 lbs, digging heavy earth, snow shoveling) and discussed continuation of mild-moderate exercise. [facility] handout given. He has no first degree  relatives in need of screening. We also reviewed avoidance of fluoroquinolone class as they can perpetuate aneurysms. 2. H/o aortic valve replacement - remains on Coumadin, followed by primary care, no issues with bleeding. Reviewed SBE prophylaxis and to notify if any procedures planned (including dental cleanings) so that we can review pre-med protocols. 3. Essential HTN - controlled. Tends to run low-normal. Will continue to observe. If any signs of low BP are reported would consider decreasing losartan.  4. Hyperlipidemia  - followed by PCP.  Disposition: F/u with Dr. Angelena Form in 1 year.   Medication Adjustments/Labs and Tests Ordered: Current medicines are reviewed at length with the patient today.  Concerns regarding medicines are outlined above. Medication changes, Labs and Tests ordered today are summarized above and listed in the Patient Instructions accessible in Encounters.   Signed, Charlie Pitter, PA-C  02/05/2018 9:53 AM    [address], [place], [place]  [address] Phone: [phone]; Fax: [phone]

## 2018-02-05 ENCOUNTER — Telehealth: Payer: Self-pay | Admitting: Physician Assistant

## 2018-02-05 ENCOUNTER — Ambulatory Visit (INDEPENDENT_AMBULATORY_CARE_PROVIDER_SITE_OTHER): Payer: BLUE CROSS/BLUE SHIELD | Admitting: Physician Assistant

## 2018-02-05 ENCOUNTER — Encounter: Payer: Self-pay | Admitting: Physician Assistant

## 2018-02-05 VITALS — BP 102/72 | HR 68 | Ht 68.0 in | Wt 218.4 lb

## 2018-02-05 DIAGNOSIS — I712 Thoracic aortic aneurysm, without rupture, unspecified: Secondary | ICD-10-CM

## 2018-02-05 DIAGNOSIS — I714 Abdominal aortic aneurysm, without rupture, unspecified: Secondary | ICD-10-CM

## 2018-02-05 DIAGNOSIS — I10 Essential (primary) hypertension: Secondary | ICD-10-CM | POA: Diagnosis not present

## 2018-02-05 DIAGNOSIS — E785 Hyperlipidemia, unspecified: Secondary | ICD-10-CM | POA: Diagnosis not present

## 2018-02-05 DIAGNOSIS — Z952 Presence of prosthetic heart valve: Secondary | ICD-10-CM | POA: Diagnosis not present

## 2018-02-05 NOTE — Telephone Encounter (Addendum)
Please let patient know I spoke with Dr. Angelena Form and as long as he's feeling stable Dr. Angelena Form feels OK that monitoring with CT every 2 years is OK. F/u in 1 yr as discussed - repeat CT will likely be ordered at that time. Elon Lomeli PA-C

## 2018-02-05 NOTE — Patient Instructions (Addendum)
Endocarditis Information  You may be at risk for developing endocarditis since you have  an artificial heart valve  or a repaired heart valve. Endocarditis is an infection of the lining of the heart or heart valves.   Certain surgical and dental procedures may put you at risk,  such as teeth cleaning or other dental procedures or any surgery involving the respiratory, urinary, gastrointestinal tract, gallbladder or prostate.   Notify your doctor or dentist before having any invasive procedures. You will need to take antibiotics before certain procedures.   To prevent endocarditis, maintain good oral health. Seek prompt medical attention for any mouth/gum, skin or urinary tract infections.  Aneurysm Information  Mainstays of therapy for aneurysms include good blood pressure control, healthy lifestyle, and avoiding fluoroquinolone antibiotic medications (such as those in the "Cipro" class, ending in "floxacin") due to risk of damage to the aorta. This is a finding I would expect to be monitored periodically by their primary cardiologist. Since aneurysms can run in families, you should discuss your diagnosis with first degree relatives as they may need to be screened for this. Regular mild-moderate physical exercise is OK but avoid heavy lifting/weight lifting over 30lbs, chopping wood, shoveling snow or digging heavy earth with a shovel. I typically refer patients to this flyer (https://medicine.[employer].tn.pdf) for more information.  Medication Instructions:  Your physician recommends that you continue on your current medications as directed. Please refer to the Current Medication list given to you today.   Labwork: -None  Testing/Procedures: -None  Follow-Up: Your physician wants you to follow-up in: 1 year with  Dr. Angelena Form.  You will receive a reminder letter in the mail two months in advance. If you don't receive a letter, please  call our office to schedule the follow-up appointment.   Any Other Special Instructions Will Be Listed Below (If Applicable).     If you need a refill on your cardiac medications before your next appointment, please call your pharmacy.

## 2018-02-05 NOTE — Telephone Encounter (Signed)
S/w pt is aware of Dr. Camillia Herter recommendation's.

## 2018-02-11 ENCOUNTER — Telehealth: Payer: Self-pay | Admitting: Oncology

## 2018-02-11 ENCOUNTER — Inpatient Hospital Stay: Payer: BLUE CROSS/BLUE SHIELD | Attending: Oncology | Admitting: Oncology

## 2018-02-11 VITALS — BP 130/88 | HR 68 | Temp 98.5°F | Resp 17 | Ht 68.0 in | Wt 221.3 lb

## 2018-02-11 DIAGNOSIS — Z7901 Long term (current) use of anticoagulants: Secondary | ICD-10-CM | POA: Diagnosis not present

## 2018-02-11 DIAGNOSIS — Z79899 Other long term (current) drug therapy: Secondary | ICD-10-CM | POA: Diagnosis not present

## 2018-02-11 DIAGNOSIS — C469 Kaposi's sarcoma, unspecified: Secondary | ICD-10-CM | POA: Insufficient documentation

## 2018-02-11 DIAGNOSIS — Z9221 Personal history of antineoplastic chemotherapy: Secondary | ICD-10-CM | POA: Diagnosis not present

## 2018-02-11 DIAGNOSIS — B1089 Other human herpesvirus infection: Secondary | ICD-10-CM

## 2018-02-11 NOTE — Progress Notes (Signed)
Hematology and Oncology Follow Up Visit  Austin [name] [id number] 1956-05-19 61 y.o. 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  He is HIV negative without any evidence of disseminated disease.   Past therapy: Doxil chemotherapy given at 30 mg/m every 4 weeks the first cycle given at [facility] in June 2016. He is status post 6 cycles completed in November 2016.  Current therapy: Active surveillance.  Interim History:  Austin [name] is here for a follow-up.  Since the last visit, he has reported feeling reasonably well but has noted slight increase in the size as well as the discoloration of his lower extremity Kaposi's lesions.  He also noted some small satellite lesions around his left lower extremity.  He denies any generalized weakness or excessive fatigue.  He denies any abdominal pain or discomfort.  He denies any weight loss.  Appetite remain excellent and have gained weight.  He denies any recent cardiac complications and remains on warfarin for anticoagulation for his valve.  He does not report any headaches, blurry vision, syncope or seizures.  He denies any alteration in mental status or confusion.  He does not report any fevers, chills, sweats. He does not report any chest pain,  orthopnea or leg edema. He does not report any cough, wheezing or shortness of breath. He does not report any nausea, vomiting, abdominal pain or early satiety.  He denies any hematochezia or melena. He does not report any frequency, urgency or hesitancy. He does not report any arthralgias or myalgias.  He does not report any lymphadenopathy or petechiae.  He denies any bleeding or clotting tendencies. Remainder review of systems unremarkable.   Medications: I have reviewed the patient's current medications.  Current Outpatient Medications  Medication Sig Dispense Refill  .  Diclofenac Sodium 2 % SOLN Apply 1 pump twice daily. 112 g 3  . fenofibrate 160 MG tablet Take 1 tablet (160 mg total) by mouth daily. 90 tablet 2  . ferrous sulfate 325 (65 FE) MG tablet Take 1 tablet (325 mg total) by mouth daily with breakfast. 30 tablet 3  . losartan (COZAAR) 25 MG tablet TAKE 1 TABLET DAILY 90 tablet 3  . metoprolol (TOPROL-XL) 200 MG 24 hr tablet TAKE 1 TABLET DAILY 90 tablet 3  . Multiple Vitamin (MULTIVITAMIN) tablet Take 1 tablet by mouth daily.    . rosuvastatin (CRESTOR) 40 MG tablet TAKE 1 TABLET DAILY 90 tablet 3  . warfarin (COUMADIN) 5 MG tablet TAKE AS DIRECTED BY ANTICOAGULATION CLINIC - 90 day supply 120 tablet 0   No current facility-administered medications for this visit.      Allergies:  Allergies  Allergen Reactions  . Celecoxib Itching and Other (See Comments)    Joint pain    Past Medical History, Surgical history, Social history, and Family History were reviewed and updated.   Physical Exam: Blood pressure 130/88, pulse 68, temperature 98.5 F (36.9 C), temperature source Oral, resp. rate 17, height 5\' 8"  (1.727 m), weight 221 lb 4.8 oz (100.4 kg), SpO2 96 %.   ECOG: 0 General appearance: Well-appearing gentleman appeared without distress. Head: Atraumatic without abnormalities. Eyes: No scleral icterus. Oropharynx: Without any thrush or ulcers. Lymph nodes: No lymphadenopathy noted in the cervical, supraclavicular, inguinal or axillary nodes  Heart:regular rate and rhythm, metallic click auscultated.  No leg edema. Lung: clear in all lung fields without any  wheezes or dullness to percussion. Abdomin: soft without any tenderness or rebound.  No shifting dullness or ascites. Skin: Left lower extremity lesion noted which is not dramatically changed from previous.  Appeared slightly darker with small satellite lesions noted.  No other Kaposi's lesion detected.   Lab Results: Lab Results  Component Value Date   WBC 4.1 09/07/2017    HGB 16.1 09/07/2017   HCT 47.8 09/07/2017   MCV 84.5 09/07/2017   PLT 158 09/07/2017     Chemistry      Component Value Date/Time   NA 142 09/07/2017 1027   NA 144 12/21/2016 0839   NA 143 11/03/2016 0908   K 4.6 09/07/2017 1027   K 5.0 11/03/2016 0908   CL 108 09/07/2017 1027   CO2 29 09/07/2017 1027   CO2 27 11/03/2016 0908   BUN 28 (H) 09/07/2017 1027   BUN 24 12/21/2016 0839   BUN 33.5 (H) 11/03/2016 0908   CREATININE 1.34 09/07/2017 1027   CREATININE 1.5 (H) 11/03/2016 0908      Component Value Date/Time   CALCIUM 9.4 09/07/2017 1027   CALCIUM 9.5 11/03/2016 0908   ALKPHOS 20 (L) 09/07/2017 1027   ALKPHOS 25 (L) 11/03/2016 0908   AST 26 09/07/2017 1027   AST 29 11/03/2016 0908   ALT 26 09/07/2017 1027   ALT 29 11/03/2016 0908   BILITOT 0.8 09/07/2017 1027   BILITOT 0.70 11/03/2016 0908       Impression and Plan:   62 year old man with:  1. 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  He completed 6 cycles of Doxil with complete remission and 2016 without any major complications.  Now he has developed a possible recurrence with small lesions in his left lower extremity.  The natural course of this disease and treatment options were discussed today.  These options would include topical treatments, focal radiation therapy and systemic chemotherapy.  Risks and benefits of all these options were reviewed today and given the fact that he does not have disseminated disease, local therapy may be a reasonable option.  He would like to defer systemic chemotherapy if all possible.  He is interested in learning about radiation as a potential local treatment.  I will refer him to radiation oncology for an opinion and to follow him closely in case he develops disseminated disease.  He understands disseminated disease will require systemic treatments.     2. Cardiomyopathy: He has history of  cardiomyopathy and related to Doxil.  His last echocardiogram done on January 30, 2017 showed a normal EF of close to 64% completed at [facility].   3. Follow-up: Will be in 4 weeks and sooner if needed.   15  minutes was spent with the patient face-to-face today.  More than 50% of time was dedicated to patient counseling, education and coordination of his care.   Zola Button, Austin [name] 7/15/20198:36 AM

## 2018-02-11 NOTE — Telephone Encounter (Signed)
Gave patient avs report and appointments for August. Sent referral to radonc via [facility]. Radonc will contact patient.

## 2018-02-14 NOTE — Progress Notes (Signed)
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  The lesion on the anterior or his right leg continued to rise and increase in size.  He was evaluated by dermatology and a biopsy was obtained on 11/09/2014.  The biopsy confirmed the presence of Kaposi sarcoma with immunohistochemical stain positive for HHV-8.  He was evaluated by Dr. Kendall Flack at [facility] in his HIV testing was negative. He felt that he might have sporadic case of Kaposi's sarcoma rather than HIV related case.  02/11/2018:Patient has noticed slight increase in the size as well as the discoloration of his lower extremity Kaposi's lesions.  He also noted some small satellite lesions around his left lower extremity.  CT CAP 2016: No evidence of systemic disease without any lymphadenopathy or lymphangitic spread.  Past/Anticipated interventions by medical oncology: Dr. Alen Blew 02/11/2018 Past therapy: Doxil chemotherapy given at 30 mg/m every 4 weeks the first cycle given at [facility] in June 2016. He is status post 6 cycles completed in November 2016.  Current therapy: Active surveillance.  -The natural course of this disease and treatment options were discussed today.  These options would include topical treatments, focal radiation therapy and systemic chemotherapy.  -Given the fact that he does not have disseminated disease, local therapy may be a reasonable option.  - He is interested in learning about radiation as a potential local treatment.  I will refer him to radiation oncology for an opinion and to follow him closely in case he develops disseminated disease. -Follow-up: Will be in 4 weeks and sooner if needed   Past/Anticipated  interventions by patient's surgeon/dermatologist for current problematic lesion, if any:  -Saw dermatology in 2016 who ordered biopsy and subsequently referred him to Dr. Kendall Flack at [name]  SAFETY ISSUES:  Prior radiation? No  Pacemaker/ICD?  No  Possible current pregnancy? No  Is the patient on methotrexate? No  Denies having pain today in his right leg; he does have swelling in the right  lower leg more in the calf denies numbness or weakness. Current Complaints / other details:   -Patient has hearing loss and had a cochlear implant-Left Ear 07/29/2013 Dr. Lake Bells Readings from Last 3 Encounters:  02/20/18 220 lb 12.8 oz (100.2 kg)  02/11/18 221 lb 4.8 oz (100.4 kg)  02/05/18 218 lb 6.4 oz (99.1 kg)  BP 133/77 (BP Location: Left Arm, Patient Position: Sitting, Cuff Size: Normal)   Pulse 60   Temp 99.3 F (37.4 C) (Oral)   Resp 16   Ht 5\' 8"  (1.727 m)   Wt 220 lb 12.8 oz (100.2 kg)   SpO2 99%   BMI 33.57 kg/m

## 2018-02-19 ENCOUNTER — Ambulatory Visit: Payer: BLUE CROSS/BLUE SHIELD

## 2018-02-19 ENCOUNTER — Ambulatory Visit: Payer: BLUE CROSS/BLUE SHIELD | Admitting: Radiation Oncology

## 2018-02-20 ENCOUNTER — Ambulatory Visit
Admission: RE | Admit: 2018-02-20 | Discharge: 2018-02-20 | Disposition: A | Discharge status: Home / Self Care | Payer: BLUE CROSS/BLUE SHIELD | Source: Ambulatory Visit | Attending: Radiation Oncology | Admitting: Radiation Oncology

## 2018-02-20 ENCOUNTER — Other Ambulatory Visit: Payer: Self-pay

## 2018-02-20 ENCOUNTER — Encounter: Payer: Self-pay | Admitting: Radiation Oncology

## 2018-02-20 VITALS — BP 133/77 | HR 60 | Temp 99.3°F | Resp 16 | Ht 68.0 in | Wt 220.8 lb

## 2018-02-20 DIAGNOSIS — E079 Disorder of thyroid, unspecified: Secondary | ICD-10-CM | POA: Diagnosis not present

## 2018-02-20 DIAGNOSIS — C467 Kaposi's sarcoma of other sites: Secondary | ICD-10-CM | POA: Diagnosis not present

## 2018-02-20 DIAGNOSIS — E785 Hyperlipidemia, unspecified: Secondary | ICD-10-CM | POA: Diagnosis not present

## 2018-02-20 DIAGNOSIS — C469 Kaposi's sarcoma, unspecified: Secondary | ICD-10-CM | POA: Insufficient documentation

## 2018-02-20 DIAGNOSIS — Z87891 Personal history of nicotine dependence: Secondary | ICD-10-CM | POA: Diagnosis not present

## 2018-02-20 DIAGNOSIS — I129 Hypertensive chronic kidney disease with stage 1 through stage 4 chronic kidney disease, or unspecified chronic kidney disease: Secondary | ICD-10-CM | POA: Insufficient documentation

## 2018-02-20 DIAGNOSIS — I2510 Atherosclerotic heart disease of native coronary artery without angina pectoris: Secondary | ICD-10-CM | POA: Insufficient documentation

## 2018-02-20 DIAGNOSIS — Z9221 Personal history of antineoplastic chemotherapy: Secondary | ICD-10-CM | POA: Insufficient documentation

## 2018-02-20 DIAGNOSIS — E669 Obesity, unspecified: Secondary | ICD-10-CM | POA: Diagnosis not present

## 2018-02-20 DIAGNOSIS — Z7901 Long term (current) use of anticoagulants: Secondary | ICD-10-CM | POA: Insufficient documentation

## 2018-02-20 DIAGNOSIS — Z9621 Cochlear implant status: Secondary | ICD-10-CM | POA: Diagnosis not present

## 2018-02-20 DIAGNOSIS — N182 Chronic kidney disease, stage 2 (mild): Secondary | ICD-10-CM | POA: Insufficient documentation

## 2018-02-20 DIAGNOSIS — Z79899 Other long term (current) drug therapy: Secondary | ICD-10-CM | POA: Diagnosis not present

## 2018-02-20 DIAGNOSIS — Z952 Presence of prosthetic heart valve: Secondary | ICD-10-CM | POA: Diagnosis not present

## 2018-02-20 NOTE — Addendum Note (Signed)
Encounter addended by: Malena Edman, RN on: 02/20/2018 1:44 PM  Actions taken: Charge Capture section accepted

## 2018-02-20 NOTE — Progress Notes (Addendum)
Radiation Oncology         [phone] ________________________________  Name: Austin BURMESTER        MRN: [id number]  Date of Service: 02/20/2018 DOB: 1955-12-31  YI:RSWN, [name], MD  Wyatt Portela, MD     REFERRING PHYSICIAN: Wyatt Portela, MD   DIAGNOSIS: The encounter diagnosis was Kaposi sarcoma [facility]).   HISTORY OF PRESENT ILLNESS: Austin [name] is a 62 y.o. male seen at the request of Dr. 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 He was originally diagnosed in April 2016 with his disease after having a non resolving lesion in the RLE for about 2 years. He has been followed by medical oncology since, and has also been under the care of Dr. Kendall Flack at [facility] at the time of his diagnosis. He was noted to have a slight increase in the lesions a few weeks ago during examination and comes today to discuss options of radiotherapy. Of note he has not received prior systemic or local therapy, and his staging CT scan was negative in 2016. He also has a history of thoracic aortic aneurysm which was repaired at [facility] in 2003, and he did have another scan due to concerns for dissection in 12/2016, which did not reveal concerns for visceral disease. He comes today to discuss options of radiotherapy to the RLE lesions.    PREVIOUS RADIATION THERAPY: No   PAST MEDICAL HISTORY:  Past Medical History:  Diagnosis Date  . Anemia    iron def  . Aortic valve prosthesis present    a. SJM - [facility] 2003, chronic coumadin.  . Back pain   . CKD (chronic kidney disease), stage II   . Coronary artery disease    not sure where this dx came from - pt denies  . Family history of melanoma   . Hearing loss   . Hyperlipidemia   . Hypertension   . Internal hemorrhoid   . Kaposi sarcoma ([place])   . Obesity   . Pericardial effusion   . Thoracic aortic aneurysm [facility])    a. s/p dissection and repair @ [facility] 2003 with SJM AVR with residual findings followed  by CT.  Marland Kitchen Thyroid disease        PAST SURGICAL HISTORY: Past Surgical History:  Procedure Laterality Date  . ABDOMINAL AORTIC ANEURYSM REPAIR    . AORTIC VALVE REPLACEMENT    . impingment     right shoulder  . KNEE ARTHROSCOPY     left  . WISDOM TOOTH EXTRACTION       FAMILY HISTORY:  Family History  Problem Relation Age of Onset  . Stroke Mother        late 4s  . Stroke Father 73  . Liver disease Sister        Sclerosing cholegitis necessitating a liver transplant  . Melanoma Maternal Uncle        dx in his 55s  . Heart disease Paternal Aunt   . Other Maternal Grandfather        died under the age of 74 due to a health related issue  . Colon cancer Neg Hx      SOCIAL HISTORY:  reports that he quit smoking about 16 years ago. His smoking use included cigarettes. He has a 15.00 pack-year smoking history. He has never used smokeless tobacco. He reports that he drinks alcohol. He reports that he does not use drugs. The  patient is single and lives in [place]. 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  ALLERGIES: Celecoxib   MEDICATIONS:  Current Outpatient Medications  Medication Sig Dispense Refill  . Diclofenac Sodium 2 % SOLN Apply 1 pump twice daily. 112 g 3  . fenofibrate 160 MG tablet Take 1 tablet (160 mg total) by mouth daily. 90 tablet 2  . ferrous sulfate 325 (65 FE) MG tablet Take 1 tablet (325 mg total) by mouth daily with breakfast. 30 tablet 3  . losartan (COZAAR) 25 MG tablet TAKE 1 TABLET DAILY 90 tablet 3  . metoprolol (TOPROL-XL) 200 MG 24 hr tablet TAKE 1 TABLET DAILY 90 tablet 3  . Multiple Vitamin (MULTIVITAMIN) tablet Take 1 tablet by mouth daily.    . rosuvastatin (CRESTOR) 40 MG tablet TAKE 1 TABLET DAILY 90 tablet 3  . warfarin (COUMADIN) 5 MG tablet TAKE AS DIRECTED BY ANTICOAGULATION CLINIC - 90 day supply 120 tablet 0   No current facility-administered medications for this encounter.      REVIEW OF SYSTEMS: On review  of systems, the patient reports that he is doing well overall. He notes that his right lower extremity lesions have increasingly become more sensitive along the largest patch, and newer hyperpigmentation is noted along the upper satellite lesions, and below the level of the primary lesion on the RLE there are new nodular lesions. He denies any chest pain, shortness of breath, cough, fevers, chills, night sweats, unintended weight changes. He denies any bowel or bladder disturbances, and denies abdominal pain, nausea or vomiting. He denies any new musculoskeletal or joint aches or pains. A complete review of systems is obtained and is otherwise negative.     PHYSICAL EXAM:  Wt Readings from Last 3 Encounters:  02/11/18 221 lb 4.8 oz (100.4 kg)  02/05/18 218 lb 6.4 oz (99.1 kg)  09/07/17 207 lb (93.9 kg)   Temp Readings from Last 3 Encounters:  02/11/18 98.5 F (36.9 C) (Oral)  09/07/17 98.5 F (36.9 C) (Oral)  06/08/17 98.4 F (36.9 C) (Oral)   BP Readings from Last 3 Encounters:  02/11/18 130/88  02/05/18 102/72  09/07/17 118/78   Pulse Readings from Last 3 Encounters:  02/11/18 68  02/05/18 68  09/07/17 64     In general this is a well appearing African American male in no acute distress. He is alert and oriented x4 and appropriate throughout the examination. HEENT reveals that the patient is normocephalic, atraumatic. EOMs are intact. Cardiopulmonary assessment is negative for acute distress and he exhibits normal effort. The lower extremities are evaluated. He has RLE edema from the dorsal surface of his right foot to the knee, and he has multiple lesions along the right lower extremity, along the top of his foot measuring about 3 cm, and small satellite lesions above and below his primary lesion that is about 6 x10 cm, and crusted and nodular. The skin of the lesions are hyperpigmented, and the satellite lesions measure about 5 mm-1 cm. The left lower extremity is not edematous and  he has a small area of hyperpigmentation along the medial lower leg, about 5 cm above the medial malleolus, and along the lateral aspect of his dorsal foot measuring about 2 cm is also noted.   ECOG = 1  0 - Asymptomatic (Fully active, able to carry on all predisease activities without restriction)  1 - Symptomatic but completely ambulatory (Restricted in physically strenuous activity but ambulatory and able to  carry out work of a light or sedentary nature. For example, light housework, office work)  2 - Symptomatic, <50% in bed during the day (Ambulatory and capable of all self care but unable to carry out any work activities. Up and about more than 50% of waking hours)  3 - Symptomatic, >50% in bed, but not bedbound (Capable of only limited self-care, confined to bed or chair 50% or more of waking hours)  4 - Bedbound (Completely disabled. Cannot carry on any self-care. Totally confined to bed or chair)  5 - Death   Eustace Pen MM, Creech RH, Tormey DC, et al. [phone]). "Toxicity and response criteria of the [facility]". [place] Oncol. 5 (6): 649-55    LABORATORY DATA:  Lab Results  Component Value Date   WBC 4.1 09/07/2017   HGB 16.1 09/07/2017   HCT 47.8 09/07/2017   MCV 84.5 09/07/2017   PLT 158 09/07/2017   Lab Results  Component Value Date   NA 142 09/07/2017   K 4.6 09/07/2017   CL 108 09/07/2017   CO2 29 09/07/2017   Lab Results  Component Value Date   ALT 26 09/07/2017   AST 26 09/07/2017   ALKPHOS 20 (L) 09/07/2017   BILITOT 0.8 09/07/2017      RADIOGRAPHY: No results found.     IMPRESSION/PLAN: 1. 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 Dr. Lisbeth Renshaw discusses the pathology findings and reviews the nature of Kaposi Sarcoma and discusses the utility and sensitivity of radiotherapy for this disease. We discussed the risks, benefits, short, and long term effects of radiotherapy, and the patient is considering his options for therapy.  Dr.  Lisbeth Renshaw discusses the delivery and logistics of radiotherapy and anticipates a course of 3 weeks of radiotherapy. At the end of the discussion, the patient is interested in considering his options and we will follow up with him by phone the week of March 04, 2018. 2. In situ coclear implant. I'll reach out to Physics to make sure they're aware of his device.  In a visit lasting 45 minutes, greater than 50% of the time was spent face to face discussing his case, and coordinating the patient's care.  The above documentation reflects my direct findings during this shared patient visit. Please see the separate note by Dr. Lisbeth Renshaw on this date for the remainder of the patient's plan of care.    Carola Rhine, PAC

## 2018-02-22 ENCOUNTER — Ambulatory Visit (INDEPENDENT_AMBULATORY_CARE_PROVIDER_SITE_OTHER): Payer: BLUE CROSS/BLUE SHIELD | Admitting: General Practice

## 2018-02-22 DIAGNOSIS — Z952 Presence of prosthetic heart valve: Secondary | ICD-10-CM

## 2018-02-22 DIAGNOSIS — Z7901 Long term (current) use of anticoagulants: Secondary | ICD-10-CM

## 2018-02-22 LAB — POCT INR: INR: 2.3 (ref 2.0–3.0)

## 2018-02-22 NOTE — Patient Instructions (Addendum)
Pre visit review using our clinic review tool, if applicable. No additional management support is needed unless otherwise documented below in the visit note.  Continue to take 1 tablet daily and 1 1/2 tablets every Wednesday.  Re-check in 6 weeks.    

## 2018-03-12 ENCOUNTER — Telehealth: Payer: Self-pay | Admitting: Oncology

## 2018-03-12 ENCOUNTER — Inpatient Hospital Stay: Payer: BLUE CROSS/BLUE SHIELD | Attending: Oncology | Admitting: Oncology

## 2018-03-12 VITALS — BP 135/78 | HR 58 | Temp 98.7°F | Resp 18 | Ht 68.0 in | Wt 220.0 lb

## 2018-03-12 DIAGNOSIS — Z7901 Long term (current) use of anticoagulants: Secondary | ICD-10-CM

## 2018-03-12 DIAGNOSIS — I429 Cardiomyopathy, unspecified: Secondary | ICD-10-CM | POA: Diagnosis not present

## 2018-03-12 DIAGNOSIS — Z952 Presence of prosthetic heart valve: Secondary | ICD-10-CM

## 2018-03-12 DIAGNOSIS — Z79899 Other long term (current) drug therapy: Secondary | ICD-10-CM | POA: Insufficient documentation

## 2018-03-12 DIAGNOSIS — C460 Kaposi's sarcoma of skin: Secondary | ICD-10-CM | POA: Diagnosis not present

## 2018-03-12 DIAGNOSIS — Z9221 Personal history of antineoplastic chemotherapy: Secondary | ICD-10-CM | POA: Diagnosis not present

## 2018-03-12 DIAGNOSIS — C469 Kaposi's sarcoma, unspecified: Secondary | ICD-10-CM

## 2018-03-12 MED ORDER — PROCHLORPERAZINE MALEATE 10 MG PO TABS: 10.0000 mg | ORAL_TABLET | Freq: Four times a day (QID) | ORAL | 0 refills | Status: DC | PRN

## 2018-03-12 NOTE — Progress Notes (Signed)
Hematology and Oncology Follow Up Visit  Austin [name] [id number] April 07, 1956 62 y.o. 03/12/2018 3:43 PM Austin [name], MDJohn, [name], MD   Principle Diagnosis: 62 year old man with cutaneous Kaposi's sarcoma presented with cutaneous manifestation without disseminated disease diagnosed in April 2016.  He developed a recurrent disease in July 2019.   Past therapy: Doxil chemotherapy given at 30 mg/m every 4 weeks the first cycle given at [facility] in June 2016.  He completed 6 cycles of therapy in November 2016.  Current therapy: Under evaluation to start systemic therapy.  Interim History:  [name] presents today for a follow-up.  Since the last visit, he was evaluated by Dr. Lisbeth Renshaw for possible radiation therapy for his new Kaposi's lesions.  He noticed increase in the number of the lesion in his left lower extremity with some mild edema.  No other skin lesions noted.  He denies any other complaints at this time.  He denies any weight loss or appetite changes.  He denies any changes in bowel habits or respiratory complaints.  Continues to be active and attends to activities of daily living.  He does not report any headaches, blurry vision, syncope or seizures.  He denies any dizziness or lethargy.  He does not report any fevers, chills, sweats. He does not report any chest pain,  orthopnea or leg edema. He does not report any cough, wheezing or shortness of breath. He does not report any nausea, vomiting, abdominal pain.  He denies any changes in bowel habits.  He denies any hematochezia or melena. He does not report any frequency, urgency or hesitancy. He does not report any bone pain or pathological fractures.  He does not report any lymphadenopathy or petechiae.  He denies any skin rashes or lesions.  Remainder review of systems is negative.  Medications: I have reviewed the patient's current medications.  Current Outpatient Medications  Medication Sig Dispense  Refill  . Diclofenac Sodium 2 % SOLN Apply 1 pump twice daily. 112 g 3  . fenofibrate 160 MG tablet Take 1 tablet (160 mg total) by mouth daily. 90 tablet 2  . ferrous sulfate 325 (65 FE) MG tablet Take 1 tablet (325 mg total) by mouth daily with breakfast. 30 tablet 3  . losartan (COZAAR) 25 MG tablet TAKE 1 TABLET DAILY 90 tablet 3  . metoprolol (TOPROL-XL) 200 MG 24 hr tablet TAKE 1 TABLET DAILY 90 tablet 3  . Multiple Vitamin (MULTIVITAMIN) tablet Take 1 tablet by mouth daily.    . rosuvastatin (CRESTOR) 40 MG tablet TAKE 1 TABLET DAILY 90 tablet 3  . warfarin (COUMADIN) 5 MG tablet TAKE AS DIRECTED BY ANTICOAGULATION CLINIC - 90 day supply 120 tablet 0   No current facility-administered medications for this visit.      Allergies:  Allergies  Allergen Reactions  . Celecoxib Itching and Other (See Comments)    Joint pain    Past Medical History, Surgical history, Social history, and Family History were reviewed and updated.   Physical Exam: Blood pressure 135/78, pulse (!) 58, temperature 98.7 F (37.1 C), temperature source Oral, resp. rate 18, height 5\' 8"  (1.727 m), weight 220 lb (99.8 kg), SpO2 100 %.    ECOG: 0 General appearance: Alert, awake and without distress. Head: Normocephalic without abnormalities. Eyes: Pupils are equal and round reactive to light. Oropharynx: No oral thrush or ulcers. Lymph nodes: No  cervical, supraclavicular, inguinal or axillary lymphadenopathy. Heart:regular rate and rhythm, without any murmurs.  Metallic click noted.  Right lower extremity edema noted. Lung: clear without any rhonchi, wheezes or dullness to percussion. Abdomin: soft without any rebound or guarding.  No shifting dullness or ascites. Skin: Multiple purple raised lesions noted on his right lower extremity with no erythema or induration.  He has small and noted on his left leg.  No other lesions noted on his chest, abdomen and upper extremities.   Lab Results: Lab  Results  Component Value Date   WBC 4.1 09/07/2017   HGB 16.1 09/07/2017   HCT 47.8 09/07/2017   MCV 84.5 09/07/2017   PLT 158 09/07/2017     Chemistry      Component Value Date/Time   NA 142 09/07/2017 1027   NA 144 12/21/2016 0839   NA 143 11/03/2016 0908   K 4.6 09/07/2017 1027   K 5.0 11/03/2016 0908   CL 108 09/07/2017 1027   CO2 29 09/07/2017 1027   CO2 27 11/03/2016 0908   BUN 28 (H) 09/07/2017 1027   BUN 24 12/21/2016 0839   BUN 33.5 (H) 11/03/2016 0908   CREATININE 1.34 09/07/2017 1027   CREATININE 1.5 (H) 11/03/2016 0908      Component Value Date/Time   CALCIUM 9.4 09/07/2017 1027   CALCIUM 9.5 11/03/2016 0908   ALKPHOS 20 (L) 09/07/2017 1027   ALKPHOS 25 (L) 11/03/2016 0908   AST 26 09/07/2017 1027   AST 29 11/03/2016 0908   ALT 26 09/07/2017 1027   ALT 29 11/03/2016 0908   BILITOT 0.8 09/07/2017 1027   BILITOT 0.70 11/03/2016 0908       Impression and Plan:   62 year old man with:  1. Kaposi sarcoma with cutaneous manifestation diagnosed in April 2016.  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  He had a complete response after 6 cycles of Doxil chemotherapy and developed recurrent disease in July 2019.  The natural course of this disease was reviewed again and treatment options were reiterated.  Given the increased number of lesions he is experiencing, I have continued to recommend systemic chemotherapy.  After discussion today he is agreeable to proceed with Doxil.  Complication associated with this medication was reiterated.  These were include nausea, fatigue, cardiomyopathy, dermatological toxicities and rarely anaphylaxis.  We plan to start this treatment in the next few weeks.     2. Cardiomyopathy: This is not related due to Doxil.  In 2018 he had an echocardiogram done at [facility] which showed normal EF.  I will update his echo prior to start of chemotherapy.  3.  Antiemetics: Prescription for appropriate  antiemetics will be available to him.  4.  IV access: Peripheral veins will be used for the time being.  The cath can be used in the future if his IV access becomes an issue.   5. Follow-up: Will be in the next few weeks to start systemic chemotherapy I will continue to follow his progress while on chemotherapy.  25  minutes was spent with the patient face-to-face today.  More than 50% of time was dedicated to cussing the natural course of this disease, treatment options and complications related to therapies.     Zola Button, MD 8/13/20193:43 PM

## 2018-03-12 NOTE — Progress Notes (Signed)
Patient on plan of care prior to pathways. 

## 2018-03-12 NOTE — Progress Notes (Signed)
START OFF PATHWAY REGIMEN - [Other Dx]   [id number] Doxorubicin 20 mg/m2 q21 Days:   A cycle is every 21 days:     Liposomal doxorubicin   **Always confirm dose/schedule in your pharmacy ordering system**  Patient Characteristics: Intent of Therapy: Curative Intent, Discussed with Patient

## 2018-03-12 NOTE — Telephone Encounter (Signed)
Scheduled appt per 8/13 los - pt aware of appts - no print out needed - per FS okay per patient request to shift appts a weeks to accommodate pt.

## 2018-03-14 ENCOUNTER — Telehealth: Payer: Self-pay | Admitting: Radiation Oncology

## 2018-03-14 NOTE — Telephone Encounter (Signed)
I called and spoke with the patient. He's electing to proceed with chemotherapy and I let him know we'd be happy to review options of radiation if needed in the future.

## 2018-03-18 ENCOUNTER — Ambulatory Visit (HOSPITAL_COMMUNITY)
Admission: RE | Admit: 2018-03-18 | Discharge: 2018-03-18 | Disposition: A | Discharge status: Home / Self Care | Payer: BLUE CROSS/BLUE SHIELD | Source: Ambulatory Visit | Attending: Oncology | Admitting: Oncology

## 2018-03-18 DIAGNOSIS — I2510 Atherosclerotic heart disease of native coronary artery without angina pectoris: Secondary | ICD-10-CM | POA: Insufficient documentation

## 2018-03-18 DIAGNOSIS — I129 Hypertensive chronic kidney disease with stage 1 through stage 4 chronic kidney disease, or unspecified chronic kidney disease: Secondary | ICD-10-CM | POA: Diagnosis not present

## 2018-03-18 DIAGNOSIS — N189 Chronic kidney disease, unspecified: Secondary | ICD-10-CM | POA: Insufficient documentation

## 2018-03-18 DIAGNOSIS — I340 Nonrheumatic mitral (valve) insufficiency: Secondary | ICD-10-CM | POA: Insufficient documentation

## 2018-03-18 DIAGNOSIS — E785 Hyperlipidemia, unspecified: Secondary | ICD-10-CM | POA: Insufficient documentation

## 2018-03-18 DIAGNOSIS — C469 Kaposi's sarcoma, unspecified: Secondary | ICD-10-CM | POA: Diagnosis not present

## 2018-03-18 DIAGNOSIS — Z952 Presence of prosthetic heart valve: Secondary | ICD-10-CM | POA: Insufficient documentation

## 2018-03-18 NOTE — Progress Notes (Signed)
  Echocardiogram 2D Echocardiogram has been performed.  Darlina Sicilian M 03/18/2018, 10:37 AM

## 2018-03-28 ENCOUNTER — Other Ambulatory Visit: Payer: BLUE CROSS/BLUE SHIELD

## 2018-03-28 ENCOUNTER — Ambulatory Visit: Payer: BLUE CROSS/BLUE SHIELD

## 2018-04-04 ENCOUNTER — Inpatient Hospital Stay: Payer: BLUE CROSS/BLUE SHIELD | Attending: Oncology

## 2018-04-04 ENCOUNTER — Inpatient Hospital Stay: Payer: BLUE CROSS/BLUE SHIELD

## 2018-04-04 VITALS — BP 118/71 | HR 61 | Temp 98.5°F | Resp 17

## 2018-04-04 DIAGNOSIS — Z5111 Encounter for antineoplastic chemotherapy: Secondary | ICD-10-CM | POA: Insufficient documentation

## 2018-04-04 DIAGNOSIS — C460 Kaposi's sarcoma of skin: Secondary | ICD-10-CM | POA: Insufficient documentation

## 2018-04-04 DIAGNOSIS — C469 Kaposi's sarcoma, unspecified: Secondary | ICD-10-CM

## 2018-04-04 LAB — CMP (CANCER CENTER ONLY)
ALBUMIN: 4.3 g/dL (ref 3.5–5.0)
ALK PHOS: 31 U/L — AB (ref 38–126)
ALT: 28 U/L (ref 0–44)
ANION GAP: 9 (ref 5–15)
AST: 29 U/L (ref 15–41)
BILIRUBIN TOTAL: 0.6 mg/dL (ref 0.3–1.2)
BUN: 28 mg/dL — AB (ref 8–23)
CO2: 24 mmol/L (ref 22–32)
CREATININE: 1.77 mg/dL — AB (ref 0.61–1.24)
Calcium: 9.5 mg/dL (ref 8.9–10.3)
Chloride: 110 mmol/L (ref 98–111)
GFR, Est AFR Am: 46 mL/min — ABNORMAL LOW (ref >60)
GFR, Estimated: 39 mL/min — ABNORMAL LOW (ref >60)
GLUCOSE: 118 mg/dL — AB (ref 70–99)
Potassium: 4.2 mmol/L (ref 3.5–5.1)
Sodium: 143 mmol/L (ref 135–145)
Total Protein: 6.6 g/dL (ref 6.5–8.1)

## 2018-04-04 LAB — CBC WITH DIFFERENTIAL (CANCER CENTER ONLY)
BASOS ABS: 0 10*3/uL (ref 0.0–0.1)
Basophils Relative: 0 %
EOS PCT: 3 %
Eosinophils Absolute: 0.2 10*3/uL (ref 0.0–0.5)
HEMATOCRIT: 44.8 % (ref 38.4–49.9)
Hemoglobin: 15.6 g/dL (ref 13.0–17.1)
LYMPHS ABS: 1.6 10*3/uL (ref 0.9–3.3)
LYMPHS PCT: 34 %
MCH: 29.4 pg (ref 27.2–33.4)
MCHC: 34.8 g/dL (ref 32.0–36.0)
MCV: 84.4 fL (ref 79.3–98.0)
MONO ABS: 0.4 10*3/uL (ref 0.1–0.9)
Monocytes Relative: 8 %
NEUTROS ABS: 2.5 10*3/uL (ref 1.5–6.5)
Neutrophils Relative %: 55 %
PLATELETS: 160 10*3/uL (ref 140–400)
RBC: 5.31 MIL/uL (ref 4.20–5.82)
RDW: 13.4 % (ref 11.0–14.6)
WBC Count: 4.7 10*3/uL (ref 4.0–10.3)

## 2018-04-04 MED ORDER — DEXTROSE 5 % IV SOLN
Freq: Once | INTRAVENOUS | Status: AC
  Administered 2018-04-04: 14:00:00 via INTRAVENOUS
  Filled 2018-04-04: qty 250

## 2018-04-04 MED ORDER — DEXAMETHASONE SODIUM PHOSPHATE 10 MG/ML IJ SOLN
INTRAMUSCULAR | Status: AC
  Filled 2018-04-04: qty 1

## 2018-04-04 MED ORDER — DEXAMETHASONE SODIUM PHOSPHATE 10 MG/ML IJ SOLN
10.0000 mg | Freq: Once | INTRAMUSCULAR | Status: AC
  Administered 2018-04-04: 10 mg via INTRAVENOUS

## 2018-04-04 MED ORDER — DOXORUBICIN HCL LIPOSOMAL CHEMO INJECTION 2 MG/ML
60.0000 mg | Freq: Once | INTRAVENOUS | Status: AC
  Administered 2018-04-04: 60 mg via INTRAVENOUS
  Filled 2018-04-04: qty 30

## 2018-04-04 NOTE — Patient Instructions (Signed)
[facility] Discharge Instructions for Patients Receiving Chemotherapy  Today you received the following chemotherapy agents Doxil  To help prevent nausea and vomiting after your treatment, we encourage you to take your nausea medication as directed   If you develop nausea and vomiting that is not controlled by your nausea medication, call the clinic.   BELOW ARE SYMPTOMS THAT SHOULD BE REPORTED IMMEDIATELY:  *FEVER GREATER THAN 100.5 F  *CHILLS WITH OR WITHOUT FEVER  NAUSEA AND VOMITING THAT IS NOT CONTROLLED WITH YOUR NAUSEA MEDICATION  *UNUSUAL SHORTNESS OF BREATH  *UNUSUAL BRUISING OR BLEEDING  TENDERNESS IN MOUTH AND THROAT WITH OR WITHOUT PRESENCE OF ULCERS  *URINARY PROBLEMS  *BOWEL PROBLEMS  UNUSUAL RASH Items with * indicate a potential emergency and should be followed up as soon as possible.  Feel free to call the clinic should you have any questions or concerns. The clinic phone number is [phone].  Please show the CHEMO ALERT CARD at check-in to the Emergency Department and triage nurse.  Doxorubicin Liposomal injection What is this medicine? LIPOSOMAL DOXORUBICIN (LIP oh som al dox oh ROO bi sin) is a chemotherapy drug. This medicine is used to treat many kinds of cancer like Kaposi's sarcoma, multiple myeloma, and ovarian cancer. This medicine may be used for other purposes; ask your health care provider or pharmacist if you have questions. COMMON BRAND NAME(S): Doxil, Lipodox What should I tell my health care provider before I take this medicine? They need to know if you have any of these conditions: -blood disorders -heart disease -infection (especially a virus infection such as chickenpox, cold sores, or herpes) -liver disease -recent or ongoing radiation therapy -an unusual or allergic reaction to doxorubicin, other chemotherapy agents, soybeans, other medicines, foods, dyes, or preservatives -pregnant or trying to get  pregnant -breast-feeding How should I use this medicine? This drug is given as an infusion into a vein. It is administered in a hospital or clinic by a specially trained health care professional. If you have pain, swelling, burning or any unusual feeling around the site of your injection, tell your health care professional right away. Talk to your pediatrician regarding the use of this medicine in children. Special care may be needed. Overdosage: If you think you have taken too much of this medicine contact a poison control center or emergency room at once. NOTE: This medicine is only for you. Do not share this medicine with others. What if I miss a dose? It is important not to miss your dose. Call your doctor or health care professional if you are unable to keep an appointment. What may interact with this medicine? Do not take this medicine with any of the following medications: -zidovudine This medicine may also interact with the following medications: -medicines to increase blood counts like filgrastim, pegfilgrastim, sargramostim -vaccines Talk to your doctor or health care professional before taking any of these medicines: -acetaminophen -aspirin -ibuprofen -ketoprofen -naproxen This list may not describe all possible interactions. Give your health care provider a list of all the medicines, herbs, non-prescription drugs, or dietary supplements you use. Also tell them if you smoke, drink alcohol, or use illegal drugs. Some items may interact with your medicine. What should I watch for while using this medicine? Your condition will be monitored carefully while you are receiving this medicine. You will need important blood work done while you are taking this medicine. This drug may make you feel generally unwell. This is not uncommon, as chemotherapy can   affect healthy cells as well as cancer cells. Report any side effects. Continue your course of treatment even though you feel ill unless  your doctor tells you to stop. Your urine may turn orange-red for a few days after your dose. This is not blood. If your urine is dark or brown, call your doctor. In some cases, you may be given additional medicines to help with side effects. Follow all directions for their use. Call your doctor or health care professional for advice if you get a fever (100.5 degrees F or higher), chills or sore throat, or other symptoms of a cold or flu. Do not treat yourself. This drug decreases your body's ability to fight infections. Try to avoid being around people who are sick. This medicine may increase your risk to bruise or bleed. Call your doctor or health care professional if you notice any unusual bleeding. Be careful brushing and flossing your teeth or using a toothpick because you may get an infection or bleed more easily. If you have any dental work done, tell your dentist you are receiving this medicine. Avoid taking products that contain aspirin, acetaminophen, ibuprofen, naproxen, or ketoprofen unless instructed by your doctor. These medicines may hide a fever. Men and women of childbearing age should use effective birth control methods while using taking this medicine. Do not become pregnant while taking this medicine. There is a potential for serious side effects to an unborn child. Talk to your health care professional or pharmacist for more information. Do not breast-feed an infant while taking this medicine. Talk to your doctor about your risk of cancer. You may be more at risk for certain types of cancers if you take this medicine. What side effects may I notice from receiving this medicine? Side effects that you should report to your doctor or health care professional as soon as possible: -allergic reactions like skin rash, itching or hives, swelling of the face, lips, or tongue -low blood counts - this medicine may decrease the number of white blood cells, red blood cells and platelets. You may  be at increased risk for infections and bleeding. -signs of hand-foot syndrome - tingling or burning, redness, flaking, swelling, small blisters, or small sores on the palms of your hands or the soles of your feet -signs of infection - fever or chills, cough, sore throat, pain or difficulty passing urine -signs of decreased platelets or bleeding - bruising, pinpoint red spots on the skin, black, tarry stools, blood in the urine -signs of decreased red blood cells - unusually weak or tired, fainting spells, lightheadedness -back pain, chills, facial flushing, fever, headache, tightness in the chest or throat during the infusion -breathing problems -chest pain -fast, irregular heartbeat -mouth pain, redness, sores -pain, swelling, redness at site where injected -pain, tingling, numbness in the hands or feet -swelling of ankles, feet, or hands -vomiting Side effects that usually do not require medical attention (report to your doctor or health care professional if they continue or are bothersome): -diarrhea -hair loss -loss of appetite -nail discoloration or damage -nausea -red or watery eyes -red colored urine -stomach upset This list may not describe all possible side effects. Call your doctor for medical advice about side effects. You may report side effects to FDA at 1-800-FDA-1088. Where should I keep my medicine? This drug is given in a hospital or clinic and will not be stored at home. NOTE: This sheet is a summary. It may not cover all possible information. If you have questions  about this medicine, talk to your doctor, pharmacist, or health care provider.  2018 Elsevier/Gold Standard (2012-04-05 10:12:56)  

## 2018-04-04 NOTE — Progress Notes (Signed)
Reviewed pt labs with Dr. Alen Blew and pt ok to treat with creatinine of 1.77

## 2018-04-04 NOTE — Progress Notes (Signed)
Okay to treat with Creat of 1.77 per Dr. Alen Blew.

## 2018-04-05 ENCOUNTER — Ambulatory Visit (INDEPENDENT_AMBULATORY_CARE_PROVIDER_SITE_OTHER): Payer: BLUE CROSS/BLUE SHIELD | Admitting: General Practice

## 2018-04-05 ENCOUNTER — Ambulatory Visit: Payer: BLUE CROSS/BLUE SHIELD | Admitting: General Practice

## 2018-04-05 DIAGNOSIS — Z7901 Long term (current) use of anticoagulants: Secondary | ICD-10-CM | POA: Diagnosis not present

## 2018-04-05 DIAGNOSIS — Z952 Presence of prosthetic heart valve: Secondary | ICD-10-CM

## 2018-04-05 LAB — POCT INR: INR: 3 (ref 2.0–3.0)

## 2018-04-05 NOTE — Patient Instructions (Signed)
Pre visit review using our clinic review tool, if applicable. No additional management support is needed unless otherwise documented below in the visit note.  Continue to take 1 tablet daily and 1 1/2 tablets every Wednesday.  Re-check in 6 weeks.    

## 2018-04-25 ENCOUNTER — Ambulatory Visit: Payer: BLUE CROSS/BLUE SHIELD | Admitting: Oncology

## 2018-04-25 ENCOUNTER — Ambulatory Visit: Payer: BLUE CROSS/BLUE SHIELD

## 2018-04-25 ENCOUNTER — Other Ambulatory Visit: Payer: BLUE CROSS/BLUE SHIELD

## 2018-05-02 ENCOUNTER — Inpatient Hospital Stay: Payer: BLUE CROSS/BLUE SHIELD

## 2018-05-02 ENCOUNTER — Inpatient Hospital Stay: Payer: BLUE CROSS/BLUE SHIELD | Attending: Oncology

## 2018-05-02 ENCOUNTER — Inpatient Hospital Stay (HOSPITAL_BASED_OUTPATIENT_CLINIC_OR_DEPARTMENT_OTHER): Payer: BLUE CROSS/BLUE SHIELD | Admitting: Oncology

## 2018-05-02 VITALS — BP 114/72 | HR 60 | Temp 98.4°F | Resp 18 | Ht 68.0 in | Wt 222.2 lb

## 2018-05-02 DIAGNOSIS — Z7901 Long term (current) use of anticoagulants: Secondary | ICD-10-CM

## 2018-05-02 DIAGNOSIS — C469 Kaposi's sarcoma, unspecified: Secondary | ICD-10-CM

## 2018-05-02 DIAGNOSIS — I429 Cardiomyopathy, unspecified: Secondary | ICD-10-CM | POA: Diagnosis not present

## 2018-05-02 DIAGNOSIS — Z23 Encounter for immunization: Secondary | ICD-10-CM | POA: Insufficient documentation

## 2018-05-02 DIAGNOSIS — Z79899 Other long term (current) drug therapy: Secondary | ICD-10-CM | POA: Diagnosis not present

## 2018-05-02 DIAGNOSIS — Z5111 Encounter for antineoplastic chemotherapy: Secondary | ICD-10-CM | POA: Insufficient documentation

## 2018-05-02 DIAGNOSIS — C460 Kaposi's sarcoma of skin: Secondary | ICD-10-CM | POA: Diagnosis not present

## 2018-05-02 LAB — CMP (CANCER CENTER ONLY)
ALBUMIN: 4.2 g/dL (ref 3.5–5.0)
ALT: 30 U/L (ref 0–44)
ANION GAP: 6 (ref 5–15)
AST: 31 U/L (ref 15–41)
Alkaline Phosphatase: 26 U/L — ABNORMAL LOW (ref 38–126)
BUN: 22 mg/dL (ref 8–23)
CO2: 25 mmol/L (ref 22–32)
Calcium: 9 mg/dL (ref 8.9–10.3)
Chloride: 110 mmol/L (ref 98–111)
Creatinine: 1.37 mg/dL — ABNORMAL HIGH (ref 0.61–1.24)
GFR, Est AFR Am: >60 mL/min (ref >60)
GFR, Estimated: 53 mL/min — ABNORMAL LOW (ref >60)
GLUCOSE: 133 mg/dL — AB (ref 70–99)
Potassium: 4.3 mmol/L (ref 3.5–5.1)
SODIUM: 141 mmol/L (ref 135–145)
Total Bilirubin: 0.7 mg/dL (ref 0.3–1.2)
Total Protein: 6.3 g/dL — ABNORMAL LOW (ref 6.5–8.1)

## 2018-05-02 LAB — CBC WITH DIFFERENTIAL (CANCER CENTER ONLY)
BASOS ABS: 0.1 10*3/uL (ref 0.0–0.1)
BASOS PCT: 3 %
EOS PCT: 3 %
Eosinophils Absolute: 0.1 10*3/uL (ref 0.0–0.5)
HCT: 42.9 % (ref 38.4–49.9)
HEMOGLOBIN: 14.8 g/dL (ref 13.0–17.1)
Lymphocytes Relative: 23 %
Lymphs Abs: 0.9 10*3/uL (ref 0.9–3.3)
MCH: 28.8 pg (ref 27.2–33.4)
MCHC: 34.4 g/dL (ref 32.0–36.0)
MCV: 83.8 fL (ref 79.3–98.0)
MONO ABS: 0.5 10*3/uL (ref 0.1–0.9)
Monocytes Relative: 12 %
NEUTROS ABS: 2.4 10*3/uL (ref 1.5–6.5)
Neutrophils Relative %: 59 %
Platelet Count: 153 10*3/uL (ref 140–400)
RBC: 5.12 MIL/uL (ref 4.20–5.82)
RDW: 14.4 % (ref 11.0–14.6)
WBC Count: 4.1 10*3/uL (ref 4.0–10.3)

## 2018-05-02 MED ORDER — INFLUENZA VAC SPLIT QUAD 0.5 ML IM SUSY
0.5000 mL | PREFILLED_SYRINGE | Freq: Once | INTRAMUSCULAR | Status: AC
  Administered 2018-05-02: 0.5 mL via INTRAMUSCULAR

## 2018-05-02 MED ORDER — DEXTROSE 5 % IV SOLN
Freq: Once | INTRAVENOUS | Status: AC
  Administered 2018-05-02: 14:00:00 via INTRAVENOUS
  Filled 2018-05-02: qty 250

## 2018-05-02 MED ORDER — DEXAMETHASONE SODIUM PHOSPHATE 10 MG/ML IJ SOLN
10.0000 mg | Freq: Once | INTRAMUSCULAR | Status: AC
  Administered 2018-05-02: 10 mg via INTRAVENOUS

## 2018-05-02 MED ORDER — DEXAMETHASONE SODIUM PHOSPHATE 10 MG/ML IJ SOLN
INTRAMUSCULAR | Status: AC
  Filled 2018-05-02: qty 1

## 2018-05-02 MED ORDER — DOXORUBICIN HCL LIPOSOMAL CHEMO INJECTION 2 MG/ML
27.0000 mg/m2 | Freq: Once | INTRAVENOUS | Status: AC
  Administered 2018-05-02: 60 mg via INTRAVENOUS
  Filled 2018-05-02: qty 30

## 2018-05-02 MED ORDER — INFLUENZA VAC SPLIT QUAD 0.5 ML IM SUSY
PREFILLED_SYRINGE | INTRAMUSCULAR | Status: AC
  Filled 2018-05-02: qty 0.5

## 2018-05-02 NOTE — Progress Notes (Signed)
Hematology and Oncology Follow Up Visit  Austin SANTELLAN [id number] 28-Jan-1956 62 y.o. 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  He presented with the multiple cutaneous lesions and developed a recurrent disease in July 2019.   Past therapy: Doxil chemotherapy given at 30 mg/m every 4 weeks the first cycle given at [facility] in June 2016.  He completed 6 cycles of therapy in November 2016.  Current therapy: Doxil 30 mg/m every 4 weeks restarted in September 2019.  Interim History:  [name] today for a follow-up.  Since last visit, he received the first cycle of Doxil without any complications.  He denies any nausea, vomiting or infusion related complications.  He denies any dermatological toxicities or rash.  He is Kaposi's lesions appeared about the same and no new lesions reported.  He denies any dyspnea on exertion or shortness of breath.  His performance status and activity level remained unchanged.  He does not report any headaches, blurry vision, syncope or seizures.  He denies any alteration in mental status or confusion.  He does not report any fevers, chills, sweats. He does not report any chest pain,  orthopnea or leg edema. He does not report any cough, wheezing or shortness of breath. He does not report any nausea, vomiting, abdominal pain.  He denies any constipation or diarrhea. He does not report any frequency, urgency or hesitancy.  He denies any hematuria.  He does not report any arthralgias or myalgias.  He does not report any lymphadenopathy or petechiae.  He denies any bleeding or clotting tendency.  He denies any mood changes.  Remainder review of systems is negative.  Medications: I have reviewed the patient's current medications.  Current Outpatient Medications  Medication Sig Dispense Refill  . Diclofenac Sodium 2 % SOLN Apply 1 pump twice  daily. 112 g 3  . fenofibrate 160 MG tablet Take 1 tablet (160 mg total) by mouth daily. 90 tablet 2  . ferrous sulfate 325 (65 FE) MG tablet Take 1 tablet (325 mg total) by mouth daily with breakfast. 30 tablet 3  . losartan (COZAAR) 25 MG tablet TAKE 1 TABLET DAILY 90 tablet 3  . metoprolol (TOPROL-XL) 200 MG 24 hr tablet TAKE 1 TABLET DAILY 90 tablet 3  . Multiple Vitamin (MULTIVITAMIN) tablet Take 1 tablet by mouth daily.    . prochlorperazine (COMPAZINE) 10 MG tablet Take 1 tablet (10 mg total) by mouth every 6 (six) hours as needed for nausea or vomiting. 30 tablet 0  . rosuvastatin (CRESTOR) 40 MG tablet TAKE 1 TABLET DAILY 90 tablet 3  . warfarin (COUMADIN) 5 MG tablet TAKE AS DIRECTED BY ANTICOAGULATION CLINIC - 90 day supply 120 tablet 0   No current facility-administered medications for this visit.      Allergies:  Allergies  Allergen Reactions  . Celecoxib Itching and Other (See Comments)    Joint pain    Past Medical History, Surgical history, Social history, and Family History were reviewed and updated.   Physical Exam:  Blood pressure 114/72, pulse 60, temperature 98.4 F (36.9 C), temperature source Oral, resp. rate 18, height 5\' 8"  (1.727 m), weight 222 lb 3.2 oz (100.8 kg), SpO2 100 %.    ECOG: 0   General appearance: Comfortable appearing without any discomfort Head: Normocephalic without any trauma Oropharynx: Mucous membranes are moist and pink without any thrush or ulcers. Eyes:  Pupils are equal and round reactive to light. Lymph nodes: No cervical, supraclavicular, inguinal or axillary lymphadenopathy.   Heart:regular rate and rhythm.  S1 and S2 without leg edema. Lung: Clear without any rhonchi or wheezes.  No dullness to percussion. Abdomin: Soft, nontender, nondistended with good bowel sounds.  No hepatosplenomegaly. Musculoskeletal: No joint deformity or effusion.  Full range of motion noted. Neurological: No deficits noted on motor, sensory and  deep tendon reflex exam. Skin: Scaly lesions noted on his extremities bilaterally.  No erythema or raised lesions noted. Psychiatric: Mood and affect appeared appropriate.    Lab Results: Lab Results  Component Value Date   WBC 4.7 04/04/2018   HGB 15.6 04/04/2018   HCT 44.8 04/04/2018   MCV 84.4 04/04/2018   PLT 160 04/04/2018     Chemistry      Component Value Date/Time   NA 143 04/04/2018 1327   NA 144 12/21/2016 0839   NA 143 11/03/2016 0908   K 4.2 04/04/2018 1327   K 5.0 11/03/2016 0908   CL 110 04/04/2018 1327   CO2 24 04/04/2018 1327   CO2 27 11/03/2016 0908   BUN 28 (H) 04/04/2018 1327   BUN 24 12/21/2016 0839   BUN 33.5 (H) 11/03/2016 0908   CREATININE 1.77 (H) 04/04/2018 1327   CREATININE 1.5 (H) 11/03/2016 0908      Component Value Date/Time   CALCIUM 9.5 04/04/2018 1327   CALCIUM 9.5 11/03/2016 0908   ALKPHOS 31 (L) 04/04/2018 1327   ALKPHOS 25 (L) 11/03/2016 0908   AST 29 04/04/2018 1327   AST 29 11/03/2016 0908   ALT 28 04/04/2018 1327   ALT 29 11/03/2016 0908   BILITOT 0.6 04/04/2018 1327   BILITOT 0.70 11/03/2016 0908       Impression and Plan:   62 year old man with:  1. 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   He is receiving Doxil chemotherapy for a second course after relapse in 2016.  His last cycle was given in September 0973 without complications.  No new dermatological manifestation of Kaposi's noted on exam today.  His existing lesion appeared to be more scaly in appearance.  The natural course of this disease was reviewed again and alternative treatment options were discussed including topical creams and radiation therapy.  At this point he is willing to continue with systemic therapy.  The plan is to complete 6 cycles of therapy depending on his tolerance and dermatological response.   2.  Cardiomyopathy: Echocardiogram obtained on 03/18/2018 showed preserved LV function.  Continues to follow with cardiology regarding the issue.  Will need to monitor cardiac function on Doxil.  3.  Antiemetics: No reported issues with nausea and vomiting at this time.  Antiemetics are available to him.  4.  IV access: He continues to use peripheral veins for the time being.  Port-A-Cath will be inserted if IV access becomes an issue.  5. Follow-up: Will be on a monthly basis to complete 6 cycles of therapy.  25  minutes was spent with the patient face-to-face today.  More than 50% of time was dedicated to reviewing his disease status, treatment options and managing complications related to therapy.   Zola Button, MD 10/3/20191:19 PM

## 2018-05-06 ENCOUNTER — Encounter: Payer: Self-pay | Admitting: *Deleted

## 2018-05-17 ENCOUNTER — Ambulatory Visit (INDEPENDENT_AMBULATORY_CARE_PROVIDER_SITE_OTHER): Payer: BLUE CROSS/BLUE SHIELD | Admitting: General Practice

## 2018-05-17 DIAGNOSIS — Z952 Presence of prosthetic heart valve: Secondary | ICD-10-CM

## 2018-05-17 DIAGNOSIS — Z7901 Long term (current) use of anticoagulants: Secondary | ICD-10-CM | POA: Diagnosis not present

## 2018-05-17 LAB — POCT INR: INR: 2.3 (ref 2.0–3.0)

## 2018-05-17 NOTE — Patient Instructions (Addendum)
Pre visit review using our clinic review tool, if applicable. No additional management support is needed unless otherwise documented below in the visit note.  Continue to take 1 tablet daily and 1 1/2 tablets every Wednesday.  Re-check in 6 weeks.    

## 2018-05-23 ENCOUNTER — Ambulatory Visit: Payer: BLUE CROSS/BLUE SHIELD

## 2018-05-23 ENCOUNTER — Other Ambulatory Visit: Payer: BLUE CROSS/BLUE SHIELD

## 2018-05-23 ENCOUNTER — Ambulatory Visit: Payer: BLUE CROSS/BLUE SHIELD | Admitting: Oncology

## 2018-05-30 ENCOUNTER — Telehealth: Payer: Self-pay | Admitting: Oncology

## 2018-05-30 ENCOUNTER — Inpatient Hospital Stay: Payer: BLUE CROSS/BLUE SHIELD

## 2018-05-30 ENCOUNTER — Inpatient Hospital Stay (HOSPITAL_BASED_OUTPATIENT_CLINIC_OR_DEPARTMENT_OTHER): Payer: BLUE CROSS/BLUE SHIELD | Admitting: Oncology

## 2018-05-30 VITALS — BP 112/66 | HR 79 | Temp 99.0°F | Resp 18 | Ht 68.0 in | Wt 220.6 lb

## 2018-05-30 DIAGNOSIS — Z79899 Other long term (current) drug therapy: Secondary | ICD-10-CM

## 2018-05-30 DIAGNOSIS — C469 Kaposi's sarcoma, unspecified: Secondary | ICD-10-CM

## 2018-05-30 DIAGNOSIS — Z7901 Long term (current) use of anticoagulants: Secondary | ICD-10-CM | POA: Diagnosis not present

## 2018-05-30 DIAGNOSIS — I429 Cardiomyopathy, unspecified: Secondary | ICD-10-CM | POA: Diagnosis not present

## 2018-05-30 DIAGNOSIS — C460 Kaposi's sarcoma of skin: Secondary | ICD-10-CM

## 2018-05-30 DIAGNOSIS — Z23 Encounter for immunization: Secondary | ICD-10-CM | POA: Diagnosis not present

## 2018-05-30 DIAGNOSIS — Z5111 Encounter for antineoplastic chemotherapy: Secondary | ICD-10-CM | POA: Diagnosis not present

## 2018-05-30 LAB — CMP (CANCER CENTER ONLY)
ALT: 34 U/L (ref 0–44)
AST: 34 U/L (ref 15–41)
Albumin: 4.3 g/dL (ref 3.5–5.0)
Alkaline Phosphatase: 30 U/L — ABNORMAL LOW (ref 38–126)
Anion gap: 6 (ref 5–15)
BUN: 24 mg/dL — ABNORMAL HIGH (ref 8–23)
CHLORIDE: 110 mmol/L (ref 98–111)
CO2: 26 mmol/L (ref 22–32)
Calcium: 9.2 mg/dL (ref 8.9–10.3)
Creatinine: 1.64 mg/dL — ABNORMAL HIGH (ref 0.61–1.24)
GFR, EST AFRICAN AMERICAN: 50 mL/min — AB (ref >60)
GFR, EST NON AFRICAN AMERICAN: 43 mL/min — AB (ref >60)
Glucose, Bld: 123 mg/dL — ABNORMAL HIGH (ref 70–99)
POTASSIUM: 4.6 mmol/L (ref 3.5–5.1)
SODIUM: 142 mmol/L (ref 135–145)
Total Bilirubin: 0.7 mg/dL (ref 0.3–1.2)
Total Protein: 6.7 g/dL (ref 6.5–8.1)

## 2018-05-30 LAB — CBC WITH DIFFERENTIAL (CANCER CENTER ONLY)
Abs Immature Granulocytes: 0.02 10*3/uL (ref 0.00–0.07)
BASOS ABS: 0 10*3/uL (ref 0.0–0.1)
Basophils Relative: 1 %
EOS ABS: 0.2 10*3/uL (ref 0.0–0.5)
EOS PCT: 4 %
HEMATOCRIT: 45 % (ref 39.0–52.0)
HEMOGLOBIN: 15.3 g/dL (ref 13.0–17.0)
IMMATURE GRANULOCYTES: 0 %
LYMPHS PCT: 25 %
Lymphs Abs: 1.2 10*3/uL (ref 0.7–4.0)
MCH: 28.5 pg (ref 26.0–34.0)
MCHC: 34 g/dL (ref 30.0–36.0)
MCV: 84 fL (ref 80.0–100.0)
MONOS PCT: 13 %
Monocytes Absolute: 0.6 10*3/uL (ref 0.1–1.0)
Neutro Abs: 2.8 10*3/uL (ref 1.7–7.7)
Neutrophils Relative %: 57 %
Platelet Count: 184 10*3/uL (ref 150–400)
RBC: 5.36 MIL/uL (ref 4.22–5.81)
RDW: 13.1 % (ref 11.5–15.5)
WBC Count: 4.9 10*3/uL (ref 4.0–10.5)
nRBC: 0 % (ref 0.0–0.2)

## 2018-05-30 MED ORDER — DOXORUBICIN HCL LIPOSOMAL CHEMO INJECTION 2 MG/ML
27.0000 mg/m2 | Freq: Once | INTRAVENOUS | Status: AC
  Administered 2018-05-30: 60 mg via INTRAVENOUS
  Filled 2018-05-30: qty 30

## 2018-05-30 MED ORDER — DEXTROSE 5 % IV SOLN
Freq: Once | INTRAVENOUS | Status: AC
  Administered 2018-05-30: 14:00:00 via INTRAVENOUS
  Filled 2018-05-30: qty 250

## 2018-05-30 MED ORDER — DEXAMETHASONE SODIUM PHOSPHATE 10 MG/ML IJ SOLN
10.0000 mg | Freq: Once | INTRAMUSCULAR | Status: AC
  Administered 2018-05-30: 10 mg via INTRAVENOUS

## 2018-05-30 MED ORDER — DEXAMETHASONE SODIUM PHOSPHATE 10 MG/ML IJ SOLN
INTRAMUSCULAR | Status: AC
  Filled 2018-05-30: qty 1

## 2018-05-30 NOTE — Progress Notes (Signed)
Per Dr. Alen Blew: OK to treat with creatinine of 1.64

## 2018-05-30 NOTE — Telephone Encounter (Signed)
Gave pt avs and calendar  °

## 2018-05-30 NOTE — Progress Notes (Signed)
Hematology and Oncology Follow Up Visit  Austin [name] [id number] 07/15/1956 62 y.o. 05/30/2018 12:43 PM Austin [name], MDJohn, [name], MD   Principle Diagnosis: 62 year old man with Kaposi's sarcoma with cutaneous manifestation sdiagnosed in April 2016.     Past therapy: Doxil chemotherapy given at 30 mg/m every 4 weeks the first cycle given at [facility] in June 2016.  He completed 6 cycles of therapy in November 2016. He developed relapsed disease in September 2019.  Current therapy: Doxil 30 mg/m every 4 weeks restarted in September 2019.  He is here for cycle 3 of therapy.  Interim History:  [name] presents today for a follow-up by himself.  Since the last visit, he received the last cycle of therapy without any new complications.  He denies any infusion related complications, fatigue or tiredness.  He denies any skin rashes or lesions.  His performance status and quality of life remains unchanged.  He continues to work regularly and appetite remains unchanged.  He denies any new skin lesions associated with Kaposi's sarcoma.  He denies any changes in bowel habits or respiratory difficulties.  He does not report any headaches, blurry vision, syncope or seizures.  He denies any dizziness or confusion.  He does not report any fevers, chills, sweats. He does not report any chest pain,  orthopnea or leg edema. He does not report any cough, wheezing or shortness of breath. He does not report any nausea, vomiting, abdominal pain.  He denies any changes in his bowel habits.  He does not report any frequency, urgency or hesitancy.  He denies any hematuria.  He does not report any bone pain or pathological fractures.  He does not report any bruising or ecchymosis.  He denies any anxiety or depression.  Remainder review of systems is negative.  Medications: I have reviewed the patient's current medications.  Current Outpatient Medications  Medication Sig Dispense  Refill  . Diclofenac Sodium 2 % SOLN Apply 1 pump twice daily. 112 g 3  . fenofibrate 160 MG tablet Take 1 tablet (160 mg total) by mouth daily. 90 tablet 2  . ferrous sulfate 325 (65 FE) MG tablet Take 1 tablet (325 mg total) by mouth daily with breakfast. 30 tablet 3  . losartan (COZAAR) 25 MG tablet TAKE 1 TABLET DAILY 90 tablet 3  . metoprolol (TOPROL-XL) 200 MG 24 hr tablet TAKE 1 TABLET DAILY 90 tablet 3  . Multiple Vitamin (MULTIVITAMIN) tablet Take 1 tablet by mouth daily.    . prochlorperazine (COMPAZINE) 10 MG tablet Take 1 tablet (10 mg total) by mouth every 6 (six) hours as needed for nausea or vomiting. 30 tablet 0  . rosuvastatin (CRESTOR) 40 MG tablet TAKE 1 TABLET DAILY 90 tablet 3  . warfarin (COUMADIN) 5 MG tablet TAKE AS DIRECTED BY ANTICOAGULATION CLINIC - 90 day supply 120 tablet 0   No current facility-administered medications for this visit.      Allergies:  Allergies  Allergen Reactions  . Celecoxib Itching and Other (See Comments)    Joint pain    Past Medical History, Surgical history, Social history, and Family History were reviewed and updated.   Physical Exam:  Blood pressure 112/66, pulse 79, temperature 99 F (37.2 C), temperature source Oral, resp. rate 18, height 5\' 8"  (1.727 m), weight 220 lb 9.6 oz (100.1 kg), SpO2 98 %.     ECOG: 0   General appearance: Alert, awake without any distress. Head: Atraumatic without abnormalities  Oropharynx: Without any thrush or ulcers. Eyes: No scleral icterus. Lymph nodes: No lymphadenopathy noted in the cervical, supraclavicular, or axillary nodes Heart:regular rate and rhythm, without any murmurs or gallops.   Lung: Clear to auscultation without any rhonchi, wheezes or dullness to percussion. Abdomin: Soft, nontender without any shifting dullness or ascites. Musculoskeletal: No clubbing or cyanosis. Neurological: No motor or sensory deficits. Skin: Very dry scaly lesions noted on his lower  extremities.  No new lesions detected.    Lab Results: Lab Results  Component Value Date   WBC 4.1 05/02/2018   HGB 14.8 05/02/2018   HCT 42.9 05/02/2018   MCV 83.8 05/02/2018   PLT 153 05/02/2018     Chemistry      Component Value Date/Time   NA 141 05/02/2018 1310   NA 144 12/21/2016 0839   NA 143 11/03/2016 0908   K 4.3 05/02/2018 1310   K 5.0 11/03/2016 0908   CL 110 05/02/2018 1310   CO2 25 05/02/2018 1310   CO2 27 11/03/2016 0908   BUN 22 05/02/2018 1310   BUN 24 12/21/2016 0839   BUN 33.5 (H) 11/03/2016 0908   CREATININE 1.37 (H) 05/02/2018 1310   CREATININE 1.5 (H) 11/03/2016 0908      Component Value Date/Time   CALCIUM 9.0 05/02/2018 1310   CALCIUM 9.5 11/03/2016 0908   ALKPHOS 26 (L) 05/02/2018 1310   ALKPHOS 25 (L) 11/03/2016 0908   AST 31 05/02/2018 1310   AST 29 11/03/2016 0908   ALT 30 05/02/2018 1310   ALT 29 11/03/2016 0908   BILITOT 0.7 05/02/2018 1310   BILITOT 0.70 11/03/2016 0908       Impression and Plan:   62 year old man with:  1. Kaposi sarcoma diagnosed in April 2016 and subsequently relapsed in 2019 with cutaneous manifestation.  At the time of diagnosis he was HHV-8+ and he is HIV negative.   He is currently receiving Doxil and have tolerated retreatment without any major complications.  Risks and benefits of continuing this therapy long-term was reviewed today.  The goal is to treat him with 6 cycles or to achieve complete response prior to discontinuation of therapy.  Alternative treatment options were also reviewed today and at this time he is agreeable to continue.  Imaging studies of the chest abdomen pelvis will be done if there is any concern of Kaposi's involvement in visceral organs.  At this time, he has no clinical signs or symptoms to suggest so.   2. Cardiomyopathy: Baseline echocardiogram was updated in August 2019.  His LV function remains adequate.  3.  Antiemetics: No reported issues with nausea and vomiting at this  time.  Antiemetics are available to him.  He  4.  IV access: He continues to use peripheral veins for the time being.  Port-A-Cath will be inserted if IV access becomes an issue.  5. Follow-up: Will be on a monthly basis to complete 6 cycles of therapy.  25  minutes was spent with the patient face-to-face today.  More than 50% of time was dedicated to reviewing the natural course of his disease, treatment options, alternative therapies and coordinating plan of care.   Zola Button, MD 10/31/201912:43 PM

## 2018-05-30 NOTE — Patient Instructions (Signed)
[facility] Discharge Instructions for Patients Receiving Chemotherapy  Today you received the following chemotherapy agents: Doxorubicin HCL Liposome (Doxil)  To help prevent nausea and vomiting after your treatment, we encourage you to take your nausea medication as directed.     If you develop nausea and vomiting that is not controlled by your nausea medication, call the clinic.   BELOW ARE SYMPTOMS THAT SHOULD BE REPORTED IMMEDIATELY:  *FEVER GREATER THAN 100.5 F  *CHILLS WITH OR WITHOUT FEVER  NAUSEA AND VOMITING THAT IS NOT CONTROLLED WITH YOUR NAUSEA MEDICATION  *UNUSUAL SHORTNESS OF BREATH  *UNUSUAL BRUISING OR BLEEDING  TENDERNESS IN MOUTH AND THROAT WITH OR WITHOUT PRESENCE OF ULCERS  *URINARY PROBLEMS  *BOWEL PROBLEMS  UNUSUAL RASH Items with * indicate a potential emergency and should be followed up as soon as possible.  Feel free to call the clinic should you have any questions or concerns. The clinic phone number is [phone].  Please show the [facility] at check-in to the Emergency Department and triage nurse.

## 2018-06-07 ENCOUNTER — Ambulatory Visit: Payer: BLUE CROSS/BLUE SHIELD | Admitting: Oncology

## 2018-06-22 DIAGNOSIS — W101XXA Fall (on)(from) sidewalk curb, initial encounter: Secondary | ICD-10-CM | POA: Diagnosis not present

## 2018-06-22 DIAGNOSIS — Y999 Unspecified external cause status: Secondary | ICD-10-CM | POA: Diagnosis not present

## 2018-06-22 DIAGNOSIS — W19XXXA Unspecified fall, initial encounter: Secondary | ICD-10-CM | POA: Diagnosis not present

## 2018-06-22 DIAGNOSIS — S025XXA Fracture of tooth (traumatic), initial encounter for closed fracture: Secondary | ICD-10-CM | POA: Diagnosis not present

## 2018-06-22 DIAGNOSIS — R51 Headache: Secondary | ICD-10-CM | POA: Diagnosis not present

## 2018-06-28 ENCOUNTER — Inpatient Hospital Stay: Payer: BLUE CROSS/BLUE SHIELD | Attending: Oncology

## 2018-06-28 ENCOUNTER — Inpatient Hospital Stay (HOSPITAL_BASED_OUTPATIENT_CLINIC_OR_DEPARTMENT_OTHER): Payer: BLUE CROSS/BLUE SHIELD | Admitting: Oncology

## 2018-06-28 ENCOUNTER — Telehealth: Payer: Self-pay

## 2018-06-28 ENCOUNTER — Inpatient Hospital Stay: Payer: BLUE CROSS/BLUE SHIELD

## 2018-06-28 VITALS — BP 130/76 | HR 73 | Temp 98.6°F | Resp 17 | Ht 68.0 in | Wt 220.2 lb

## 2018-06-28 DIAGNOSIS — Z5111 Encounter for antineoplastic chemotherapy: Secondary | ICD-10-CM | POA: Insufficient documentation

## 2018-06-28 DIAGNOSIS — Z79899 Other long term (current) drug therapy: Secondary | ICD-10-CM

## 2018-06-28 DIAGNOSIS — C469 Kaposi's sarcoma, unspecified: Secondary | ICD-10-CM

## 2018-06-28 DIAGNOSIS — C460 Kaposi's sarcoma of skin: Secondary | ICD-10-CM | POA: Diagnosis not present

## 2018-06-28 DIAGNOSIS — Z7901 Long term (current) use of anticoagulants: Secondary | ICD-10-CM | POA: Insufficient documentation

## 2018-06-28 DIAGNOSIS — I429 Cardiomyopathy, unspecified: Secondary | ICD-10-CM | POA: Insufficient documentation

## 2018-06-28 LAB — CMP (CANCER CENTER ONLY)
ALT: 28 U/L (ref 0–44)
ANION GAP: 9 (ref 5–15)
AST: 32 U/L (ref 15–41)
Albumin: 4.3 g/dL (ref 3.5–5.0)
Alkaline Phosphatase: 27 U/L — ABNORMAL LOW (ref 38–126)
BUN: 24 mg/dL — ABNORMAL HIGH (ref 8–23)
CHLORIDE: 110 mmol/L (ref 98–111)
CO2: 23 mmol/L (ref 22–32)
CREATININE: 1.7 mg/dL — AB (ref 0.61–1.24)
Calcium: 9.1 mg/dL (ref 8.9–10.3)
GFR, EST AFRICAN AMERICAN: 49 mL/min — AB (ref >60)
GFR, EST NON AFRICAN AMERICAN: 42 mL/min — AB (ref >60)
Glucose, Bld: 142 mg/dL — ABNORMAL HIGH (ref 70–99)
POTASSIUM: 4.5 mmol/L (ref 3.5–5.1)
Sodium: 142 mmol/L (ref 135–145)
Total Bilirubin: 0.5 mg/dL (ref 0.3–1.2)
Total Protein: 6.6 g/dL (ref 6.5–8.1)

## 2018-06-28 LAB — CBC WITH DIFFERENTIAL (CANCER CENTER ONLY)
Abs Immature Granulocytes: 0.02 10*3/uL (ref 0.00–0.07)
BASOS ABS: 0 10*3/uL (ref 0.0–0.1)
Basophils Relative: 1 %
EOS PCT: 2 %
Eosinophils Absolute: 0.1 10*3/uL (ref 0.0–0.5)
HCT: 43.1 % (ref 39.0–52.0)
HEMOGLOBIN: 14.9 g/dL (ref 13.0–17.0)
Immature Granulocytes: 1 %
LYMPHS PCT: 29 %
Lymphs Abs: 1.2 10*3/uL (ref 0.7–4.0)
MCH: 28.8 pg (ref 26.0–34.0)
MCHC: 34.6 g/dL (ref 30.0–36.0)
MCV: 83.2 fL (ref 80.0–100.0)
Monocytes Absolute: 0.5 10*3/uL (ref 0.1–1.0)
Monocytes Relative: 14 %
NEUTROS ABS: 2.2 10*3/uL (ref 1.7–7.7)
NRBC: 0 % (ref 0.0–0.2)
Neutrophils Relative %: 53 %
Platelet Count: 183 10*3/uL (ref 150–400)
RBC: 5.18 MIL/uL (ref 4.22–5.81)
RDW: 13 % (ref 11.5–15.5)
WBC: 4 10*3/uL (ref 4.0–10.5)

## 2018-06-28 MED ORDER — DOXORUBICIN HCL LIPOSOMAL CHEMO INJECTION 2 MG/ML
60.0000 mg | Freq: Once | INTRAVENOUS | Status: AC
  Administered 2018-06-28: 60 mg via INTRAVENOUS
  Filled 2018-06-28: qty 30

## 2018-06-28 MED ORDER — DEXTROSE 5 % IV SOLN
Freq: Once | INTRAVENOUS | Status: AC
  Administered 2018-06-28: 10:00:00 via INTRAVENOUS
  Filled 2018-06-28: qty 250

## 2018-06-28 MED ORDER — DEXAMETHASONE SODIUM PHOSPHATE 10 MG/ML IJ SOLN
INTRAMUSCULAR | Status: AC
  Filled 2018-06-28: qty 1

## 2018-06-28 MED ORDER — DEXAMETHASONE SODIUM PHOSPHATE 10 MG/ML IJ SOLN
10.0000 mg | Freq: Once | INTRAMUSCULAR | Status: AC
  Administered 2018-06-28: 10 mg via INTRAVENOUS

## 2018-06-28 NOTE — Telephone Encounter (Signed)
Printed avs and calender of upcoming appointment. Per 11/29 los 

## 2018-06-28 NOTE — Patient Instructions (Signed)
[facility] Discharge Instructions for Patients Receiving Chemotherapy  Today you received the following chemotherapy agents Doxil   To help prevent nausea and vomiting after your treatment, we encourage you to take your nausea medication as directed.   If you develop nausea and vomiting that is not controlled by your nausea medication, call the clinic.   BELOW ARE SYMPTOMS THAT SHOULD BE REPORTED IMMEDIATELY:  *FEVER GREATER THAN 100.5 F  *CHILLS WITH OR WITHOUT FEVER  NAUSEA AND VOMITING THAT IS NOT CONTROLLED WITH YOUR NAUSEA MEDICATION  *UNUSUAL SHORTNESS OF BREATH  *UNUSUAL BRUISING OR BLEEDING  TENDERNESS IN MOUTH AND THROAT WITH OR WITHOUT PRESENCE OF ULCERS  *URINARY PROBLEMS  *BOWEL PROBLEMS  UNUSUAL RASH Items with * indicate a potential emergency and should be followed up as soon as possible.  Feel free to call the clinic should you have any questions or concerns. The clinic phone number is [phone].  Please show the CHEMO ALERT CARD at check-in to the Emergency Department and triage nurse.   

## 2018-06-28 NOTE — Progress Notes (Signed)
Hematology and Oncology Follow Up Visit  Austin [name] [id number] 01-31-1956 62 y.o. 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     Past therapy:  Doxil chemotherapy given at 30 mg/m every 4 weeks the first cycle given at [facility] in June 2016.   He completed 6 cycles of therapy in November 2016. He developed relapsed disease in September 2019.  Current therapy: Doxil 30 mg/m every 4 weeks restarted in September 2019.  He is here for cycle 4 of therapy.  Interim History:  Austin [name] returns today for a repeat evaluation.  Since the last visit, he reports no major changes in his health.  He tolerated Doxil chemotherapy without any recent complaints.  He denies any nausea, excessive fatigue or infusion related complications.  He denies any dermatological issues including hand-foot syndrome or any new Kaposi's lesions.  His appetite and performance status remain excellent.  He denies any change in his bowel habits.  Performance status and quality of life is unchanged.  He does not report any headaches, blurry vision, syncope or seizures.  He denies any alteration in mental status or lethargy.   He does not report any fevers, chills, sweats. He does not report any chest pain,  orthopnea or leg edema. He does not report any cough, wheezing or shortness of breath. He does not report any nausea, vomiting, abdominal pain.  He denies any constipation or diarrhea.  He does not report any frequency, urgency or hesitancy. He does not report any arthralgias or myalgias.  He does not report any bleeding or clotting tendency.  He denies any changes in his mood remainder review of systems is negative.  Medications: I have reviewed the patient's current medications.  Current Outpatient Medications  Medication Sig Dispense Refill  . Diclofenac Sodium 2 % SOLN Apply 1 pump  twice daily. 112 g 3  . fenofibrate 160 MG tablet Take 1 tablet (160 mg total) by mouth daily. 90 tablet 2  . ferrous sulfate 325 (65 FE) MG tablet Take 1 tablet (325 mg total) by mouth daily with breakfast. 30 tablet 3  . losartan (COZAAR) 25 MG tablet TAKE 1 TABLET DAILY 90 tablet 3  . metoprolol (TOPROL-XL) 200 MG 24 hr tablet TAKE 1 TABLET DAILY 90 tablet 3  . Multiple Vitamin (MULTIVITAMIN) tablet Take 1 tablet by mouth daily.    . prochlorperazine (COMPAZINE) 10 MG tablet Take 1 tablet (10 mg total) by mouth every 6 (six) hours as needed for nausea or vomiting. 30 tablet 0  . rosuvastatin (CRESTOR) 40 MG tablet TAKE 1 TABLET DAILY 90 tablet 3  . warfarin (COUMADIN) 5 MG tablet TAKE AS DIRECTED BY ANTICOAGULATION CLINIC - 90 day supply 120 tablet 0   No current facility-administered medications for this visit.      Allergies:  Allergies  Allergen Reactions  . Celecoxib Itching and Other (See Comments)    Joint pain    Past Medical History, Surgical history, Social history, and Family History were reviewed and updated.   Physical Exam:   Blood pressure 130/76, pulse 73, temperature 98.6 F (37 C), temperature source Oral, resp. rate 17, height 5\' 8"  (1.727 m), weight 220 lb 3.2 oz (99.9 kg), SpO2 100 %.     ECOG: 0   General appearance: Comfortable appearing without any discomfort Head: Normocephalic without any trauma Oropharynx: Mucous membranes are moist and pink  without any thrush or ulcers. Eyes: Pupils are equal and round reactive to light. Lymph nodes: No cervical, supraclavicular, inguinal or axillary lymphadenopathy.   Heart:regular rate and rhythm.  S1 and S2 without leg edema. Lung: Clear without any rhonchi or wheezes.  No dullness to percussion. Abdomin: Soft, nontender, nondistended with good bowel sounds.  No hepatosplenomegaly. Musculoskeletal: No joint deformity or effusion.  Full range of motion noted. Neurological: No deficits noted on motor,  sensory and deep tendon reflex exam. Skin: Dry skin noted.  Scaly healed Kaposi's lesions noted on his right lower extremity.  No new lesions noted.     Lab Results: Lab Results  Component Value Date   WBC 4.9 05/30/2018   HGB 15.3 05/30/2018   HCT 45.0 05/30/2018   MCV 84.0 05/30/2018   PLT 184 05/30/2018     Chemistry      Component Value Date/Time   NA 142 05/30/2018 1232   NA 144 12/21/2016 0839   NA 143 11/03/2016 0908   K 4.6 05/30/2018 1232   K 5.0 11/03/2016 0908   CL 110 05/30/2018 1232   CO2 26 05/30/2018 1232   CO2 27 11/03/2016 0908   BUN 24 (H) 05/30/2018 1232   BUN 24 12/21/2016 0839   BUN 33.5 (H) 11/03/2016 0908   CREATININE 1.64 (H) 05/30/2018 1232   CREATININE 1.5 (H) 11/03/2016 0908      Component Value Date/Time   CALCIUM 9.2 05/30/2018 1232   CALCIUM 9.5 11/03/2016 0908   ALKPHOS 30 (L) 05/30/2018 1232   ALKPHOS 25 (L) 11/03/2016 0908   AST 34 05/30/2018 1232   AST 29 11/03/2016 0908   ALT 34 05/30/2018 1232   ALT 29 11/03/2016 0908   BILITOT 0.7 05/30/2018 1232   BILITOT 0.70 11/03/2016 0908       Impression and Plan:   62 year old man with:  1.  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  He was found to have HHV-8+ and HIV negative.   He is currently on Doxil which she has tolerated very well without any major complications.  His cutaneous lesions are improving under the current therapy.  Risks and benefits of continuing this approach was discussed today long-term side effects were reviewed.  Alternative therapies that include radiation, topical treatments among others were reviewed.  He is willing to complete 6 cycles of therapy at this time.   2. Cardiomyopathy: Echocardiogram in August 2019 showed preserved LV function.  3.  Antiemetics: No nausea or vomiting reported at this time.  Antiemetics are available to him.  4.  IV access: No issues reported with peripheral veins.  5. Follow-up: Will be in 4 weeks for the next  cycle of therapy.  25  minutes was spent with the patient face-to-face today.  More than 50% of time was dedicated to discussing his disease status, complications related to therapy and alternative approaches were reviewed.   Zola Button, MD 11/29/20198:57 AM

## 2018-06-28 NOTE — Progress Notes (Signed)
Ok to treat with 06/28/18 CMP results per Dr. Alen Blew.

## 2018-07-05 ENCOUNTER — Ambulatory Visit (INDEPENDENT_AMBULATORY_CARE_PROVIDER_SITE_OTHER): Payer: BLUE CROSS/BLUE SHIELD | Admitting: General Practice

## 2018-07-05 DIAGNOSIS — Z952 Presence of prosthetic heart valve: Secondary | ICD-10-CM

## 2018-07-05 DIAGNOSIS — Z7901 Long term (current) use of anticoagulants: Secondary | ICD-10-CM | POA: Diagnosis not present

## 2018-07-05 LAB — POCT INR: INR: 2.5 (ref 2.0–3.0)

## 2018-07-05 NOTE — Patient Instructions (Addendum)
Pre visit review using our clinic review tool, if applicable. No additional management support is needed unless otherwise documented below in the visit note.  Continue to take 1 tablet daily and 1 1/2 tablets every Wednesday.  Re-check in 6 weeks.    

## 2018-07-25 ENCOUNTER — Inpatient Hospital Stay: Payer: BLUE CROSS/BLUE SHIELD

## 2018-07-25 ENCOUNTER — Inpatient Hospital Stay: Payer: BLUE CROSS/BLUE SHIELD | Attending: Oncology | Admitting: Oncology

## 2018-07-25 ENCOUNTER — Telehealth: Payer: Self-pay

## 2018-07-25 VITALS — BP 142/88 | HR 64 | Temp 98.1°F | Resp 17 | Ht 68.0 in | Wt 219.7 lb

## 2018-07-25 DIAGNOSIS — Z5111 Encounter for antineoplastic chemotherapy: Secondary | ICD-10-CM | POA: Diagnosis not present

## 2018-07-25 DIAGNOSIS — C469 Kaposi's sarcoma, unspecified: Secondary | ICD-10-CM

## 2018-07-25 DIAGNOSIS — C460 Kaposi's sarcoma of skin: Secondary | ICD-10-CM

## 2018-07-25 DIAGNOSIS — Z79899 Other long term (current) drug therapy: Secondary | ICD-10-CM

## 2018-07-25 DIAGNOSIS — Z7901 Long term (current) use of anticoagulants: Secondary | ICD-10-CM

## 2018-07-25 DIAGNOSIS — I429 Cardiomyopathy, unspecified: Secondary | ICD-10-CM

## 2018-07-25 LAB — CBC WITH DIFFERENTIAL (CANCER CENTER ONLY)
Abs Immature Granulocytes: 0.01 10*3/uL (ref 0.00–0.07)
Basophils Absolute: 0 10*3/uL (ref 0.0–0.1)
Basophils Relative: 1 %
EOS ABS: 0.1 10*3/uL (ref 0.0–0.5)
EOS PCT: 3 %
HCT: 45 % (ref 39.0–52.0)
Hemoglobin: 15.1 g/dL (ref 13.0–17.0)
Immature Granulocytes: 0 %
Lymphocytes Relative: 29 %
Lymphs Abs: 1.3 10*3/uL (ref 0.7–4.0)
MCH: 28.3 pg (ref 26.0–34.0)
MCHC: 33.6 g/dL (ref 30.0–36.0)
MCV: 84.3 fL (ref 80.0–100.0)
Monocytes Absolute: 0.6 10*3/uL (ref 0.1–1.0)
Monocytes Relative: 14 %
Neutro Abs: 2.4 10*3/uL (ref 1.7–7.7)
Neutrophils Relative %: 53 %
Platelet Count: 205 10*3/uL (ref 150–400)
RBC: 5.34 MIL/uL (ref 4.22–5.81)
RDW: 12.7 % (ref 11.5–15.5)
WBC Count: 4.4 10*3/uL (ref 4.0–10.5)
nRBC: 0 % (ref 0.0–0.2)

## 2018-07-25 LAB — CMP (CANCER CENTER ONLY)
ALK PHOS: 30 U/L — AB (ref 38–126)
ALT: 26 U/L (ref 0–44)
AST: 26 U/L (ref 15–41)
Albumin: 4 g/dL (ref 3.5–5.0)
Anion gap: 7 (ref 5–15)
BUN: 25 mg/dL — ABNORMAL HIGH (ref 8–23)
CO2: 25 mmol/L (ref 22–32)
Calcium: 9.4 mg/dL (ref 8.9–10.3)
Chloride: 112 mmol/L — ABNORMAL HIGH (ref 98–111)
Creatinine: 1.39 mg/dL — ABNORMAL HIGH (ref 0.61–1.24)
GFR, Estimated: 54 mL/min — ABNORMAL LOW (ref >60)
Glucose, Bld: 99 mg/dL (ref 70–99)
Potassium: 4.3 mmol/L (ref 3.5–5.1)
Sodium: 144 mmol/L (ref 135–145)
Total Bilirubin: 0.4 mg/dL (ref 0.3–1.2)
Total Protein: 6.5 g/dL (ref 6.5–8.1)

## 2018-07-25 MED ORDER — HEPARIN SOD (PORK) LOCK FLUSH 100 UNIT/ML IV SOLN
500.0000 [IU] | Freq: Once | INTRAVENOUS | Status: DC | PRN
  Filled 2018-07-25: qty 5

## 2018-07-25 MED ORDER — SODIUM CHLORIDE 0.9% FLUSH
10.0000 mL | INTRAVENOUS | Status: DC | PRN
  Filled 2018-07-25: qty 10

## 2018-07-25 MED ORDER — DOXORUBICIN HCL LIPOSOMAL CHEMO INJECTION 2 MG/ML
27.0000 mg/m2 | Freq: Once | INTRAVENOUS | Status: AC
  Administered 2018-07-25: 60 mg via INTRAVENOUS
  Filled 2018-07-25: qty 30

## 2018-07-25 MED ORDER — DEXAMETHASONE SODIUM PHOSPHATE 10 MG/ML IJ SOLN
INTRAMUSCULAR | Status: AC
  Filled 2018-07-25: qty 1

## 2018-07-25 MED ORDER — DEXTROSE 5 % IV SOLN
Freq: Once | INTRAVENOUS | Status: AC
  Administered 2018-07-25: 13:00:00 via INTRAVENOUS
  Filled 2018-07-25: qty 250

## 2018-07-25 MED ORDER — DEXAMETHASONE SODIUM PHOSPHATE 10 MG/ML IJ SOLN
10.0000 mg | Freq: Once | INTRAMUSCULAR | Status: AC
  Administered 2018-07-25: 10 mg via INTRAVENOUS

## 2018-07-25 NOTE — Patient Instructions (Signed)
[facility] Discharge Instructions for Patients Receiving Chemotherapy  Today you received the following chemotherapy agents doxorubicin liposomal (Doxil)  To help prevent nausea and vomiting after your treatment, we encourage you to take your nausea medication as directed  If you develop nausea and vomiting that is not controlled by your nausea medication, call the clinic.   BELOW ARE SYMPTOMS THAT SHOULD BE REPORTED IMMEDIATELY:  *FEVER GREATER THAN 100.5 F  *CHILLS WITH OR WITHOUT FEVER  NAUSEA AND VOMITING THAT IS NOT CONTROLLED WITH YOUR NAUSEA MEDICATION  *UNUSUAL SHORTNESS OF BREATH  *UNUSUAL BRUISING OR BLEEDING  TENDERNESS IN MOUTH AND THROAT WITH OR WITHOUT PRESENCE OF ULCERS  *URINARY PROBLEMS  *BOWEL PROBLEMS  UNUSUAL RASH Items with * indicate a potential emergency and should be followed up as soon as possible.  Feel free to call the clinic should you have any questions or concerns. The clinic phone number is [phone].  Please show the [facility] at check-in to the Emergency Department and triage nurse.

## 2018-07-25 NOTE — Progress Notes (Signed)
Hematology and Oncology Follow Up Visit  Austin [name] [id number] [birth year] 62 y.o. 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  He presented with diffuse cutaneous manifestation.   Past therapy:  Doxil chemotherapy given at 30 mg/m every 4 weeks the first cycle given at [facility] in June 2016.   He completed 6 cycles of therapy in November 2016. He developed relapsed disease in September 2019.  Current therapy: Doxil 30 mg/m every 4 weeks restarted in September 2019.  He is here for cycle 5 of therapy.  Interim History:  [name] is here for a follow-up visit.  Since her last visit, he reports no major changes in his health.  He tolerated Doxil without any new complaints.  He denies any nausea, infusion related complications or dermatological toxicities.  He denies excessive fatigue or tiredness.  He denies any respiratory complaints or changes in his bowels.  He denies any hematochezia, melena or epistaxis.  His performance status and quality of life remains excellent.  He does not report any headaches, blurry vision, syncope or seizures.  He denies any confusion or dizziness.   He does not report any fevers, chills, sweats. He does not report any chest pain,  orthopnea or leg edema. He does not report any cough, wheezing or shortness of breath. He does not report any nausea, vomiting, or abdominal distention.  He denies any changes in bowel habits.  He does not report any frequency, urgency or hesitancy. He does not report any bone pain or pathological fractures.  He does not report any ecchymosis or petechiae.  He denies any anxiety or depression.  Remainder review of systems is negative.  Medications: I have reviewed the patient's current medications.  Current Outpatient Medications  Medication Sig Dispense Refill  . Diclofenac Sodium 2 % SOLN Apply 1 pump  twice daily. 112 g 3  . fenofibrate 160 MG tablet Take 1 tablet (160 mg total) by mouth daily. 90 tablet 2  . ferrous sulfate 325 (65 FE) MG tablet Take 1 tablet (325 mg total) by mouth daily with breakfast. 30 tablet 3  . losartan (COZAAR) 25 MG tablet TAKE 1 TABLET DAILY 90 tablet 3  . metoprolol (TOPROL-XL) 200 MG 24 hr tablet TAKE 1 TABLET DAILY 90 tablet 3  . Multiple Vitamin (MULTIVITAMIN) tablet Take 1 tablet by mouth daily.    . prochlorperazine (COMPAZINE) 10 MG tablet Take 1 tablet (10 mg total) by mouth every 6 (six) hours as needed for nausea or vomiting. 30 tablet 0  . rosuvastatin (CRESTOR) 40 MG tablet TAKE 1 TABLET DAILY 90 tablet 3  . warfarin (COUMADIN) 5 MG tablet TAKE AS DIRECTED BY ANTICOAGULATION CLINIC - 90 day supply 120 tablet 0   No current facility-administered medications for this visit.      Allergies:  Allergies  Allergen Reactions  . Celecoxib Itching and Other (See Comments)    Joint pain    Past Medical History, Surgical history, Social history, and Family History were reviewed and updated.   Physical Exam:   Blood pressure (!) 142/88, pulse 64, temperature 98.1 F (36.7 C), temperature source Oral, resp. rate 17, height 5\' 8"  (1.727 m), weight 219 lb 11.2 oz (99.7 kg), SpO2 100 %.      ECOG: 0   General appearance: Alert, awake without any distress. Head: Atraumatic without abnormalities Oropharynx: Without any thrush or ulcers. Eyes:  No scleral icterus. Lymph nodes: No lymphadenopathy noted in the cervical, supraclavicular, or axillary nodes Heart:regular rate and rhythm,  metallic click auscultated.  No murmurs. Lung: Clear to auscultation without any rhonchi, wheezes or dullness to percussion. Abdomin: Soft, nontender without any shifting dullness or ascites. Musculoskeletal: No clubbing or cyanosis. Neurological: No motor or sensory deficits. Skin: Red usual scaly lesions noted on his bilateral legs more the right than the left.  No  new raised lesions noted.      Lab Results: Lab Results  Component Value Date   WBC 4.0 06/28/2018   HGB 14.9 06/28/2018   HCT 43.1 06/28/2018   MCV 83.2 06/28/2018   PLT 183 06/28/2018     Chemistry      Component Value Date/Time   NA 142 06/28/2018 0847   NA 144 12/21/2016 0839   NA 143 11/03/2016 0908   K 4.5 06/28/2018 0847   K 5.0 11/03/2016 0908   CL 110 06/28/2018 0847   CO2 23 06/28/2018 0847   CO2 27 11/03/2016 0908   BUN 24 (H) 06/28/2018 0847   BUN 24 12/21/2016 0839   BUN 33.5 (H) 11/03/2016 0908   CREATININE 1.70 (H) 06/28/2018 0847   CREATININE 1.5 (H) 11/03/2016 0908      Component Value Date/Time   CALCIUM 9.1 06/28/2018 0847   CALCIUM 9.5 11/03/2016 0908   ALKPHOS 27 (L) 06/28/2018 0847   ALKPHOS 25 (L) 11/03/2016 0908   AST 32 06/28/2018 0847   AST 29 11/03/2016 0908   ALT 28 06/28/2018 0847   ALT 29 11/03/2016 0908   BILITOT 0.5 06/28/2018 0847   BILITOT 0.70 11/03/2016 0908       Impression and Plan:   62 year old man with:  1. Kaposi sarcoma presented with cutaneous manifestation in 2016.  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   He is currently on Doxil therapy with reasonable response and no complications.  Risks and benefits of continuing this therapy was discussed and duration was reviewed.  I recommended completing 6 cycles of therapy and proceed with observation after the.  He is agreeable to proceed with treatment today and cycle 6 in 4 weeks.   2. Cardiomyopathy: He is ejection fraction in August 2019 was close to normal range.  This will continue to be monitored on Doxil.  3.  Antiemetics: Antiemetics are available to him without any nausea or vomiting reported.  4.  IV access: Peripheral veins remain in use without any issues.  Port-A-Cath would not be needed at this time.  5. Follow-up: Will be in 4 weeks for cycle 6 of therapy.  25  minutes was spent with the patient face-to-face today.  More than 50% of time was  dedicated to reviewing his disease status, treatment options and long-term complications related therapy.Zola Button, MD 12/26/201912:02 PM

## 2018-07-25 NOTE — Telephone Encounter (Signed)
Chat msg to Dr. Alen Blew regarding last labs on 06/28/18. MD would like for pt to have labs drawn today prior to [place]. Appt added at 1145. Nothing available at 1130 to satisfy the 30 minute protocol. Called pt to confirm ok with earlier arrival. Pt verbalized understanding and thanks as he thought he needed lab work as well.

## 2018-07-26 ENCOUNTER — Telehealth: Payer: Self-pay

## 2018-07-26 NOTE — Telephone Encounter (Signed)
Per 12/26 no los 

## 2018-08-20 ENCOUNTER — Ambulatory Visit (INDEPENDENT_AMBULATORY_CARE_PROVIDER_SITE_OTHER): Payer: BLUE CROSS/BLUE SHIELD | Admitting: General Practice

## 2018-08-20 DIAGNOSIS — Z7901 Long term (current) use of anticoagulants: Secondary | ICD-10-CM

## 2018-08-20 DIAGNOSIS — Z952 Presence of prosthetic heart valve: Secondary | ICD-10-CM

## 2018-08-20 LAB — POCT INR: INR: 2.6 (ref 2.0–3.0)

## 2018-08-20 NOTE — Patient Instructions (Addendum)
Pre visit review using our clinic review tool, if applicable. No additional management support is needed unless otherwise documented below in the visit note.  Continue to take 1 tablet daily and 1 1/2 tablets every Wednesday.  Re-check in 6 weeks.    

## 2018-08-23 ENCOUNTER — Inpatient Hospital Stay: Payer: BLUE CROSS/BLUE SHIELD | Attending: Oncology | Admitting: Oncology

## 2018-08-23 ENCOUNTER — Inpatient Hospital Stay: Payer: BLUE CROSS/BLUE SHIELD

## 2018-08-23 ENCOUNTER — Telehealth: Payer: Self-pay | Admitting: Oncology

## 2018-08-23 VITALS — BP 129/75 | HR 63 | Temp 98.3°F | Resp 17 | Ht 68.0 in | Wt 224.0 lb

## 2018-08-23 DIAGNOSIS — C469 Kaposi's sarcoma, unspecified: Secondary | ICD-10-CM

## 2018-08-23 DIAGNOSIS — I429 Cardiomyopathy, unspecified: Secondary | ICD-10-CM | POA: Diagnosis not present

## 2018-08-23 DIAGNOSIS — Z79899 Other long term (current) drug therapy: Secondary | ICD-10-CM | POA: Diagnosis not present

## 2018-08-23 DIAGNOSIS — Z5111 Encounter for antineoplastic chemotherapy: Secondary | ICD-10-CM | POA: Diagnosis not present

## 2018-08-23 DIAGNOSIS — Z7901 Long term (current) use of anticoagulants: Secondary | ICD-10-CM | POA: Diagnosis not present

## 2018-08-23 DIAGNOSIS — C460 Kaposi's sarcoma of skin: Secondary | ICD-10-CM | POA: Diagnosis not present

## 2018-08-23 LAB — CBC WITH DIFFERENTIAL (CANCER CENTER ONLY)
Abs Immature Granulocytes: 0.02 10*3/uL (ref 0.00–0.07)
Basophils Absolute: 0 10*3/uL (ref 0.0–0.1)
Basophils Relative: 1 %
EOS PCT: 3 %
Eosinophils Absolute: 0.1 10*3/uL (ref 0.0–0.5)
HCT: 43.8 % (ref 39.0–52.0)
Hemoglobin: 15 g/dL (ref 13.0–17.0)
Immature Granulocytes: 1 %
Lymphocytes Relative: 31 %
Lymphs Abs: 1.3 10*3/uL (ref 0.7–4.0)
MCH: 28.4 pg (ref 26.0–34.0)
MCHC: 34.2 g/dL (ref 30.0–36.0)
MCV: 83 fL (ref 80.0–100.0)
Monocytes Absolute: 0.5 10*3/uL (ref 0.1–1.0)
Monocytes Relative: 12 %
Neutro Abs: 2.1 10*3/uL (ref 1.7–7.7)
Neutrophils Relative %: 52 %
Platelet Count: 187 10*3/uL (ref 150–400)
RBC: 5.28 MIL/uL (ref 4.22–5.81)
RDW: 13.2 % (ref 11.5–15.5)
WBC Count: 4.1 10*3/uL (ref 4.0–10.5)
nRBC: 0 % (ref 0.0–0.2)

## 2018-08-23 LAB — CMP (CANCER CENTER ONLY)
ALK PHOS: 32 U/L — AB (ref 38–126)
ALT: 29 U/L (ref 0–44)
AST: 29 U/L (ref 15–41)
Albumin: 4.2 g/dL (ref 3.5–5.0)
Anion gap: 8 (ref 5–15)
BUN: 25 mg/dL — ABNORMAL HIGH (ref 8–23)
CO2: 23 mmol/L (ref 22–32)
Calcium: 9 mg/dL (ref 8.9–10.3)
Chloride: 110 mmol/L (ref 98–111)
Creatinine: 1.79 mg/dL — ABNORMAL HIGH (ref 0.61–1.24)
GFR, Est AFR Am: 46 mL/min — ABNORMAL LOW (ref >60)
GFR, Estimated: 39 mL/min — ABNORMAL LOW (ref >60)
GLUCOSE: 126 mg/dL — AB (ref 70–99)
Potassium: 4.6 mmol/L (ref 3.5–5.1)
Sodium: 141 mmol/L (ref 135–145)
Total Bilirubin: 0.6 mg/dL (ref 0.3–1.2)
Total Protein: 6.7 g/dL (ref 6.5–8.1)

## 2018-08-23 MED ORDER — DEXTROSE 5 % IV SOLN
Freq: Once | INTRAVENOUS | Status: AC
  Administered 2018-08-23: 12:00:00 via INTRAVENOUS
  Filled 2018-08-23: qty 250

## 2018-08-23 MED ORDER — DEXAMETHASONE SODIUM PHOSPHATE 10 MG/ML IJ SOLN
INTRAMUSCULAR | Status: AC
  Filled 2018-08-23: qty 1

## 2018-08-23 MED ORDER — DOXORUBICIN HCL LIPOSOMAL CHEMO INJECTION 2 MG/ML
27.0000 mg/m2 | Freq: Once | INTRAVENOUS | Status: AC
  Administered 2018-08-23: 60 mg via INTRAVENOUS
  Filled 2018-08-23: qty 30

## 2018-08-23 MED ORDER — DEXAMETHASONE SODIUM PHOSPHATE 10 MG/ML IJ SOLN
10.0000 mg | Freq: Once | INTRAMUSCULAR | Status: AC
  Administered 2018-08-23: 10 mg via INTRAVENOUS

## 2018-08-23 NOTE — Telephone Encounter (Signed)
Scheduled appt per 01/24 los.

## 2018-08-23 NOTE — Progress Notes (Signed)
Hematology and Oncology Follow Up Visit  Austin [name] [id number] 05/23/1956 63 y.o. 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   Past therapy:  Doxil chemotherapy given at 30 mg/m every 4 weeks the first cycle given at [facility] in June 2016.   He completed 6 cycles of therapy in November 2016. He developed relapsed disease in September 2019.  Current therapy: Doxil 30 mg/m every 4 weeks restarted in September 2019.  He is here for cycle 6 of therapy.  Interim History:  [name] returns today for a repeat evaluation.  Since last visit, he tolerated Doxil without any recent complaints.  He denies any infusion related complications, nausea or excessive fatigue.  He denies any dermatological toxicities including erythema or pain.  He is Kaposi's sarcoma lesions continues to regress.  Continues to be active and attends to activities of daily living.  He does not report any headaches, blurry vision, syncope or seizures.  He denies any alteration of mental status or lethargy.  He does not report any fevers, chills, sweats. He does not report any chest pain,  orthopnea or leg edema. He does not report any cough, wheezing or dyspnea on exertion.  He does not report any nausea, vomiting, or early satiety.  He denies any constipation or diarrhea..  He does not report any frequency, urgency or hesitancy. He does not report any arthralgias or myalgias.  He does not report any new skin rashes or lesions.  Denies any bleeding or clotting tendency.  Remainder review of systems is negative.  Medications: I have reviewed the patient's current medications.  Current Outpatient Medications  Medication Sig Dispense Refill  . Diclofenac Sodium 2 % SOLN Apply 1 pump twice daily. 112 g 3  . fenofibrate 160 MG tablet Take 1 tablet (160 mg total) by mouth  daily. 90 tablet 2  . ferrous sulfate 325 (65 FE) MG tablet Take 1 tablet (325 mg total) by mouth daily with breakfast. 30 tablet 3  . losartan (COZAAR) 25 MG tablet TAKE 1 TABLET DAILY 90 tablet 3  . metoprolol (TOPROL-XL) 200 MG 24 hr tablet TAKE 1 TABLET DAILY 90 tablet 3  . Multiple Vitamin (MULTIVITAMIN) tablet Take 1 tablet by mouth daily.    . prochlorperazine (COMPAZINE) 10 MG tablet Take 1 tablet (10 mg total) by mouth every 6 (six) hours as needed for nausea or vomiting. 30 tablet 0  . rosuvastatin (CRESTOR) 40 MG tablet TAKE 1 TABLET DAILY 90 tablet 3  . warfarin (COUMADIN) 5 MG tablet TAKE AS DIRECTED BY ANTICOAGULATION CLINIC - 90 day supply 120 tablet 0   No current facility-administered medications for this visit.      Allergies:  Allergies  Allergen Reactions  . Celecoxib Itching and Other (See Comments)    Joint pain    Past Medical History, Surgical history, Social history, and Family History were reviewed and updated.   Physical Exam:   Blood pressure 129/75, pulse 63, temperature 98.3 F (36.8 C), temperature source Oral, resp. rate 17, height 5\' 8"  (1.727 m), weight 224 lb (101.6 kg), SpO2 97 %.      ECOG: 0   General appearance: Comfortable appearing without any discomfort Head: Normocephalic without any trauma Oropharynx: Mucous membranes are moist and pink without any thrush or ulcers. Eyes: Pupils are equal and round reactive to light. Lymph nodes: No cervical,  supraclavicular, inguinal or axillary lymphadenopathy.   Heart:regular rate and rhythm.  S1 and S2 without leg edema. Lung: Clear without any rhonchi or wheezes.  No dullness to percussion. Abdomin: Soft, nontender, nondistended with good bowel sounds.  No hepatosplenomegaly. Musculoskeletal: No joint deformity or effusion.  Full range of motion noted. Neurological: No deficits noted on motor, sensory and deep tendon reflex exam. Skin: Well healed scaly lesion noted on both lower  extremities.  No raised lesions or erythema noted.       Lab Results: Lab Results  Component Value Date   WBC 4.1 08/23/2018   HGB 15.0 08/23/2018   HCT 43.8 08/23/2018   MCV 83.0 08/23/2018   PLT 187 08/23/2018     Chemistry      Component Value Date/Time   NA 144 07/25/2018 1148   NA 144 12/21/2016 0839   NA 143 11/03/2016 0908   K 4.3 07/25/2018 1148   K 5.0 11/03/2016 0908   CL 112 (H) 07/25/2018 1148   CO2 25 07/25/2018 1148   CO2 27 11/03/2016 0908   BUN 25 (H) 07/25/2018 1148   BUN 24 12/21/2016 0839   BUN 33.5 (H) 11/03/2016 0908   CREATININE 1.39 (H) 07/25/2018 1148   CREATININE 1.5 (H) 11/03/2016 0908      Component Value Date/Time   CALCIUM 9.4 07/25/2018 1148   CALCIUM 9.5 11/03/2016 0908   ALKPHOS 30 (L) 07/25/2018 1148   ALKPHOS 25 (L) 11/03/2016 0908   AST 26 07/25/2018 1148   AST 29 11/03/2016 0908   ALT 26 07/25/2018 1148   ALT 29 11/03/2016 0908   BILITOT 0.4 07/25/2018 1148   BILITOT 0.70 11/03/2016 0908       Impression and Plan:   63 year old man with:  1.  Cutaneous Kaposi sarcoma diagnosed in 2016.  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   He remains on Doxil therapy with excellent response to therapy and near resolution of his skin lesions.  Risks and benefits of continuing this therapy versus stopping at this time and restarting in the future was discussed.  Risks and benefits of this approach was also reviewed.  Given his near complete response to Doxil, recommended discontinuation of this therapy and resume it in the future as needed.  He is agreeable at this time and will receive cycle 6 today and a treatment will discontinued after that.   2. Cardiomyopathy: Echocardiogram in August 2019 showed no major changes.  This will be repeated in the future as needed.  3.  Antiemetics: No issues with nausea and vomiting at this time.  4.  IV access: Peripheral veins have been in use without her need for Port-A-Cath  5.  Follow-up: Will be in 6 months to follow his progress.  15  minutes was spent with the patient face-to-face today.  More than 50% of time was spent on reviewing his disease status, treatment options and answering questions regarding plan of care.   Zola Button, MD 1/24/202010:29 AM

## 2018-08-23 NOTE — Progress Notes (Signed)
Per Dr Alen Blew OK for Doxil with CRT of 1.79

## 2018-08-23 NOTE — Patient Instructions (Signed)
[facility] Discharge Instructions for Patients Receiving Chemotherapy  Today you received the following chemotherapy agents Doxil.   To help prevent nausea and vomiting after your treatment, we encourage you to take your nausea medication as prescribed.    If you develop nausea and vomiting that is not controlled by your nausea medication, call the clinic.   BELOW ARE SYMPTOMS THAT SHOULD BE REPORTED IMMEDIATELY:  *FEVER GREATER THAN 100.5 F  *CHILLS WITH OR WITHOUT FEVER  NAUSEA AND VOMITING THAT IS NOT CONTROLLED WITH YOUR NAUSEA MEDICATION  *UNUSUAL SHORTNESS OF BREATH  *UNUSUAL BRUISING OR BLEEDING  TENDERNESS IN MOUTH AND THROAT WITH OR WITHOUT PRESENCE OF ULCERS  *URINARY PROBLEMS  *BOWEL PROBLEMS  UNUSUAL RASH Items with * indicate a potential emergency and should be followed up as soon as possible.  Feel free to call the clinic should you have any questions or concerns. The clinic phone number is [phone].  Please show the CHEMO ALERT CARD at check-in to the Emergency Department and triage nurse.   

## 2018-09-13 ENCOUNTER — Encounter: Payer: Self-pay | Admitting: Internal Medicine

## 2018-09-13 ENCOUNTER — Ambulatory Visit (INDEPENDENT_AMBULATORY_CARE_PROVIDER_SITE_OTHER): Payer: BLUE CROSS/BLUE SHIELD | Admitting: Internal Medicine

## 2018-09-13 ENCOUNTER — Other Ambulatory Visit (INDEPENDENT_AMBULATORY_CARE_PROVIDER_SITE_OTHER): Payer: BLUE CROSS/BLUE SHIELD

## 2018-09-13 VITALS — BP 126/64 | HR 68 | Temp 98.5°F | Ht 68.0 in | Wt 224.0 lb

## 2018-09-13 DIAGNOSIS — R739 Hyperglycemia, unspecified: Secondary | ICD-10-CM

## 2018-09-13 DIAGNOSIS — Z0000 Encounter for general adult medical examination without abnormal findings: Secondary | ICD-10-CM

## 2018-09-13 LAB — URINALYSIS, ROUTINE W REFLEX MICROSCOPIC
Bilirubin Urine: NEGATIVE
Hgb urine dipstick: NEGATIVE
Ketones, ur: NEGATIVE
Leukocytes,Ua: NEGATIVE
Nitrite: NEGATIVE
PH: 6 (ref 5.0–8.0)
RBC / HPF: NONE SEEN (ref 0–?)
Specific Gravity, Urine: 1.02 (ref 1.000–1.030)
Urine Glucose: 250 — AB
Urobilinogen, UA: 0.2 (ref 0.0–1.0)

## 2018-09-13 LAB — TSH: TSH: 1.53 u[IU]/mL (ref 0.35–4.50)

## 2018-09-13 LAB — PSA: PSA: 1.96 ng/mL (ref 0.10–4.00)

## 2018-09-13 LAB — LIPID PANEL
Cholesterol: 165 mg/dL (ref 0–200)
HDL: 44.4 mg/dL (ref >39.00)
LDL Cholesterol: 88 mg/dL (ref 0–99)
NonHDL: 120.73
Total CHOL/HDL Ratio: 4
Triglycerides: 163 mg/dL — ABNORMAL HIGH (ref 0.0–149.0)
VLDL: 32.6 mg/dL (ref 0.0–40.0)

## 2018-09-13 LAB — HEMOGLOBIN A1C: Hgb A1c MFr Bld: 6.4 % (ref 4.6–6.5)

## 2018-09-13 MED ORDER — ROSUVASTATIN CALCIUM 40 MG PO TABS: 40.0000 mg | ORAL_TABLET | Freq: Every day | ORAL | 3 refills | Status: DC

## 2018-09-13 MED ORDER — LOSARTAN POTASSIUM 25 MG PO TABS: 25.0000 mg | ORAL_TABLET | Freq: Every day | ORAL | 3 refills | Status: DC

## 2018-09-13 MED ORDER — FENOFIBRATE 160 MG PO TABS: 160.0000 mg | ORAL_TABLET | Freq: Every day | ORAL | 3 refills | Status: DC

## 2018-09-13 MED ORDER — METOPROLOL SUCCINATE ER 200 MG PO TB24: 200.0000 mg | ORAL_TABLET | Freq: Every day | ORAL | 3 refills | Status: DC

## 2018-09-13 NOTE — Patient Instructions (Signed)

## 2018-09-13 NOTE — Progress Notes (Signed)
Subjective:    Patient ID: Austin [name], male    DOB: 02-Jul-1956, 63 y.o.   MRN: [id number]  HPI  Here for wellness and f/u;  Overall doing ok;  Pt denies Chest pain, worsening SOB, DOE, wheezing, orthopnea, PND, worsening LE edema, palpitations, dizziness or syncope.  Pt denies neurological change such as new headache, facial or extremity weakness.  Pt denies polydipsia, polyuria, or low sugar symptoms. Pt states overall good compliance with treatment and medications, good tolerability, and has been trying to follow appropriate diet.  Pt denies worsening depressive symptoms, suicidal ideation or panic. No fever, night sweats, wt loss, loss of appetite, or other constitutional symptoms.  Pt states good ability with ADL's, has low fall risk, home safety reviewed and adequate, no other significant changes in hearing or vision, and only occasionally active with exercise. Wt Readings from Last 3 Encounters:  09/13/18 224 lb (101.6 kg)  08/23/18 224 lb (101.6 kg)  07/25/18 219 lb 11.2 oz (99.7 kg)   BP Readings from Last 3 Encounters:  09/13/18 126/64  08/23/18 129/75  07/25/18 (!) 142/88   Past Medical History:  Diagnosis Date  . Anemia    iron def  . Aortic valve prosthesis present    a. SJM - [facility] 2003, chronic coumadin.  . Back pain   . CKD (chronic kidney disease), stage II   . Coronary artery disease    not sure where this dx came from - pt denies  . Family history of melanoma   . Hearing loss   . Hyperlipidemia   . Hypertension   . Internal hemorrhoid   . Kaposi sarcoma ([facility])   . Obesity   . Pericardial effusion   . Thoracic aortic aneurysm [facility])    a. s/p dissection and repair @ [facility] 2003 with SJM AVR with residual findings followed by CT.  Marland Kitchen Thyroid disease    Past Surgical History:  Procedure Laterality Date  . ABDOMINAL AORTIC ANEURYSM REPAIR    . AORTIC VALVE REPLACEMENT    . impingment     right shoulder  . KNEE ARTHROSCOPY     left  . WISDOM TOOTH  EXTRACTION      reports that he quit smoking about 16 years ago. His smoking use included cigarettes. He has a 15.00 pack-year smoking history. He has never used smokeless tobacco. He reports current alcohol use. He reports that he does not use drugs. family history includes Heart disease in his paternal aunt; Liver disease in his sister; Melanoma in his maternal uncle; Other in his maternal grandfather; Stroke in his mother; Stroke (age of onset: 7) in his father. Allergies  Allergen Reactions  . Celecoxib Itching and Other (See Comments)    Joint pain   Current Outpatient Medications on File Prior to Visit  Medication Sig Dispense Refill  . ferrous sulfate 325 (65 FE) MG tablet Take 1 tablet (325 mg total) by mouth daily with breakfast. 30 tablet 3  . Multiple Vitamin (MULTIVITAMIN) tablet Take 1 tablet by mouth daily.    . prochlorperazine (COMPAZINE) 10 MG tablet Take 1 tablet (10 mg total) by mouth every 6 (six) hours as needed for nausea or vomiting. 30 tablet 0  . warfarin (COUMADIN) 5 MG tablet TAKE AS DIRECTED BY ANTICOAGULATION CLINIC - 90 day supply 120 tablet 0   No current facility-administered medications on file prior to visit.    Review of Systems Constitutional: Negative for other unusual diaphoresis, sweats, appetite or weight changes HENT:  Negative for other worsening hearing loss, ear pain, facial swelling, mouth sores or neck stiffness.   Eyes: Negative for other worsening pain, redness or other visual disturbance.  Respiratory: Negative for other stridor or swelling Cardiovascular: Negative for other palpitations or other chest pain  Gastrointestinal: Negative for worsening diarrhea or loose stools, blood in stool, distention or other pain Genitourinary: Negative for hematuria, flank pain or other change in urine volume.  Musculoskeletal: Negative for myalgias or other joint swelling.  Skin: Negative for other color change, or other wound or worsening drainage.    Neurological: Negative for other syncope or numbness. Hematological: Negative for other adenopathy or swelling Psychiatric/Behavioral: Negative for hallucinations, other worsening agitation, SI, self-injury, or new decreased concentration All other system neg per pt    Objective:   Physical Exam BP 126/64 (BP Location: Left Arm, Patient Position: Sitting, Cuff Size: Large)   Pulse 68   Temp 98.5 F (36.9 C) (Oral)   Ht 5\' 8"  (1.727 m)   Wt 224 lb (101.6 kg)   SpO2 97%   BMI 34.06 kg/m  VS noted,  Constitutional: Pt is oriented to person, place, and time. Appears well-developed and well-nourished, in no significant distress and comfortable Head: Normocephalic and atraumatic  Eyes: Conjunctivae and EOM are normal. Pupils are equal, round, and reactive to light Right Ear: External ear normal without discharge Left Ear: External ear normal without discharge Nose: Nose without discharge or deformity Mouth/Throat: Oropharynx is without other ulcerations and moist  Neck: Normal range of motion. Neck supple. No JVD present. No tracheal deviation present or significant neck LA or mass Cardiovascular: Normal rate, regular rhythm, normal heart sounds and intact distal pulses.   Pulmonary/Chest: WOB normal and breath sounds without rales or wheezing  Abdominal: Soft. Bowel sounds are normal. NT. No HSM  Musculoskeletal: Normal range of motion. Exhibits no edema Lymphadenopathy: Has no other cervical adenopathy.  Neurological: Pt is alert and oriented to person, place, and time. Pt has normal reflexes. No cranial nerve deficit. Motor grossly intact, Gait intact Skin: Skin is warm and dry. No rash noted or new ulcerations Psychiatric:  Has normal mood and affect. Behavior is normal without agitation No other exam findings Lab Results  Component Value Date   WBC 4.1 08/23/2018   HGB 15.0 08/23/2018   HCT 43.8 08/23/2018   PLT 187 08/23/2018   GLUCOSE 126 (H) 08/23/2018   CHOL 165  09/07/2017   TRIG 113.0 09/07/2017   HDL 39.40 09/07/2017   LDLDIRECT 114.0 09/23/2014   LDLCALC 103 (H) 09/07/2017   ALT 29 08/23/2018   AST 29 08/23/2018   NA 141 08/23/2018   K 4.6 08/23/2018   CL 110 08/23/2018   CREATININE 1.79 (H) 08/23/2018   BUN 25 (H) 08/23/2018   CO2 23 08/23/2018   TSH 2.81 09/07/2017   PSA 1.69 09/07/2017   INR 2.6 08/20/2018   HGBA1C 5.9 09/07/2017       Assessment & Plan:

## 2018-09-13 NOTE — Assessment & Plan Note (Signed)
stable overall by history and exam, recent data reviewed with pt, and pt to continue medical treatment as before,  to f/u any worsening symptoms or concerns  

## 2018-09-13 NOTE — Assessment & Plan Note (Signed)

## 2018-09-14 ENCOUNTER — Other Ambulatory Visit: Payer: Self-pay | Admitting: Internal Medicine

## 2018-09-23 DIAGNOSIS — Z8679 Personal history of other diseases of the circulatory system: Secondary | ICD-10-CM | POA: Diagnosis not present

## 2018-09-23 DIAGNOSIS — Z9889 Other specified postprocedural states: Secondary | ICD-10-CM | POA: Diagnosis not present

## 2018-09-23 DIAGNOSIS — I10 Essential (primary) hypertension: Secondary | ICD-10-CM | POA: Diagnosis not present

## 2018-09-23 DIAGNOSIS — N183 Chronic kidney disease, stage 3 (moderate): Secondary | ICD-10-CM | POA: Diagnosis not present

## 2018-10-01 ENCOUNTER — Ambulatory Visit (INDEPENDENT_AMBULATORY_CARE_PROVIDER_SITE_OTHER): Payer: BLUE CROSS/BLUE SHIELD | Admitting: General Practice

## 2018-10-01 DIAGNOSIS — Z7901 Long term (current) use of anticoagulants: Secondary | ICD-10-CM

## 2018-10-01 DIAGNOSIS — Z952 Presence of prosthetic heart valve: Secondary | ICD-10-CM

## 2018-10-01 LAB — POCT INR: INR: 2.2 (ref 2.0–3.0)

## 2018-10-01 NOTE — Patient Instructions (Addendum)
Pre visit review using our clinic review tool, if applicable. No additional management support is needed unless otherwise documented below in the visit note.  Continue to take 1 tablet daily and 1 1/2 tablets every Wednesday.  Re-check in 6 weeks.    

## 2018-11-12 ENCOUNTER — Ambulatory Visit (INDEPENDENT_AMBULATORY_CARE_PROVIDER_SITE_OTHER): Payer: BLUE CROSS/BLUE SHIELD | Admitting: General Practice

## 2018-11-12 DIAGNOSIS — Z952 Presence of prosthetic heart valve: Secondary | ICD-10-CM

## 2018-11-12 DIAGNOSIS — Z7901 Long term (current) use of anticoagulants: Secondary | ICD-10-CM | POA: Diagnosis not present

## 2018-11-12 LAB — POCT INR: INR: 3.4 — AB (ref 2.0–3.0)

## 2018-11-12 NOTE — Patient Instructions (Addendum)
Pre visit review using our clinic review tool, if applicable. No additional management support is needed unless otherwise documented below in the visit note.  Hold dosage today (4/14) and then continue to take 1 tablet daily and 1 1/2 tablets every Wednesday.  Re-check in 6 weeks.

## 2018-12-24 ENCOUNTER — Other Ambulatory Visit: Payer: Self-pay

## 2018-12-24 ENCOUNTER — Ambulatory Visit (INDEPENDENT_AMBULATORY_CARE_PROVIDER_SITE_OTHER): Payer: BLUE CROSS/BLUE SHIELD | Admitting: General Practice

## 2018-12-24 DIAGNOSIS — Z7901 Long term (current) use of anticoagulants: Secondary | ICD-10-CM | POA: Diagnosis not present

## 2018-12-24 DIAGNOSIS — Z952 Presence of prosthetic heart valve: Secondary | ICD-10-CM

## 2018-12-24 LAB — POCT INR: INR: 2.5 (ref 2.0–3.0)

## 2018-12-24 NOTE — Patient Instructions (Signed)
Pre visit review using our clinic review tool, if applicable. No additional management support is needed unless otherwise documented below in the visit note.  Continue to take 1 tablet daily and 1 1/2 tablets every Wednesday.  Re-check in 6 weeks.

## 2019-02-04 ENCOUNTER — Other Ambulatory Visit: Payer: Self-pay

## 2019-02-04 ENCOUNTER — Ambulatory Visit (INDEPENDENT_AMBULATORY_CARE_PROVIDER_SITE_OTHER): Payer: BC Managed Care – PPO | Admitting: General Practice

## 2019-02-04 DIAGNOSIS — Z7901 Long term (current) use of anticoagulants: Secondary | ICD-10-CM | POA: Diagnosis not present

## 2019-02-04 DIAGNOSIS — Z952 Presence of prosthetic heart valve: Secondary | ICD-10-CM

## 2019-02-04 LAB — POCT INR: INR: 3.2 — AB (ref 2.0–3.0)

## 2019-02-04 NOTE — Patient Instructions (Signed)
Pre visit review using our clinic review tool, if applicable. No additional management support is needed unless otherwise documented below in the visit note.  Hold coumadin tomorrow (7/8) and then continue to take 1 tablet daily and 1 1/2 tablets every Wednesday.  Re-check in 6 weeks.

## 2019-02-20 ENCOUNTER — Inpatient Hospital Stay (HOSPITAL_BASED_OUTPATIENT_CLINIC_OR_DEPARTMENT_OTHER): Payer: BC Managed Care – PPO | Admitting: Oncology

## 2019-02-20 ENCOUNTER — Other Ambulatory Visit: Payer: Self-pay

## 2019-02-20 ENCOUNTER — Inpatient Hospital Stay: Payer: BC Managed Care – PPO | Attending: Oncology

## 2019-02-20 ENCOUNTER — Telehealth: Payer: Self-pay | Admitting: Oncology

## 2019-02-20 ENCOUNTER — Other Ambulatory Visit: Payer: Self-pay | Admitting: Internal Medicine

## 2019-02-20 VITALS — BP 135/76 | HR 70 | Temp 98.6°F | Resp 17 | Ht 68.0 in | Wt 232.2 lb

## 2019-02-20 DIAGNOSIS — Z7901 Long term (current) use of anticoagulants: Secondary | ICD-10-CM | POA: Insufficient documentation

## 2019-02-20 DIAGNOSIS — Z79899 Other long term (current) drug therapy: Secondary | ICD-10-CM | POA: Insufficient documentation

## 2019-02-20 DIAGNOSIS — I429 Cardiomyopathy, unspecified: Secondary | ICD-10-CM | POA: Diagnosis not present

## 2019-02-20 DIAGNOSIS — C469 Kaposi's sarcoma, unspecified: Secondary | ICD-10-CM | POA: Diagnosis not present

## 2019-02-20 DIAGNOSIS — Z9221 Personal history of antineoplastic chemotherapy: Secondary | ICD-10-CM

## 2019-02-20 LAB — CBC WITH DIFFERENTIAL (CANCER CENTER ONLY)
Abs Immature Granulocytes: 0.01 10*3/uL (ref 0.00–0.07)
Basophils Absolute: 0 10*3/uL (ref 0.0–0.1)
Basophils Relative: 0 %
Eosinophils Absolute: 0.1 10*3/uL (ref 0.0–0.5)
Eosinophils Relative: 3 %
HCT: 47.3 % (ref 39.0–52.0)
Hemoglobin: 15.8 g/dL (ref 13.0–17.0)
Immature Granulocytes: 0 %
Lymphocytes Relative: 34 %
Lymphs Abs: 1.5 10*3/uL (ref 0.7–4.0)
MCH: 27.2 pg (ref 26.0–34.0)
MCHC: 33.4 g/dL (ref 30.0–36.0)
MCV: 81.6 fL (ref 80.0–100.0)
Monocytes Absolute: 0.4 10*3/uL (ref 0.1–1.0)
Monocytes Relative: 9 %
Neutro Abs: 2.4 10*3/uL (ref 1.7–7.7)
Neutrophils Relative %: 54 %
Platelet Count: 156 10*3/uL (ref 150–400)
RBC: 5.8 MIL/uL (ref 4.22–5.81)
RDW: 13.8 % (ref 11.5–15.5)
WBC Count: 4.4 10*3/uL (ref 4.0–10.5)
nRBC: 0 % (ref 0.0–0.2)

## 2019-02-20 LAB — CMP (CANCER CENTER ONLY)
ALT: 26 U/L (ref 0–44)
AST: 25 U/L (ref 15–41)
Albumin: 4.3 g/dL (ref 3.5–5.0)
Alkaline Phosphatase: 33 U/L — ABNORMAL LOW (ref 38–126)
Anion gap: 7 (ref 5–15)
BUN: 24 mg/dL — ABNORMAL HIGH (ref 8–23)
CO2: 23 mmol/L (ref 22–32)
Calcium: 8.7 mg/dL — ABNORMAL LOW (ref 8.9–10.3)
Chloride: 111 mmol/L (ref 98–111)
Creatinine: 1.48 mg/dL — ABNORMAL HIGH (ref 0.61–1.24)
GFR, Est AFR Am: 58 mL/min — ABNORMAL LOW (ref >60)
GFR, Estimated: 50 mL/min — ABNORMAL LOW (ref >60)
Glucose, Bld: 220 mg/dL — ABNORMAL HIGH (ref 70–99)
Potassium: 4.3 mmol/L (ref 3.5–5.1)
Sodium: 141 mmol/L (ref 135–145)
Total Bilirubin: 0.6 mg/dL (ref 0.3–1.2)
Total Protein: 7 g/dL (ref 6.5–8.1)

## 2019-02-20 NOTE — Telephone Encounter (Signed)
Scheduled per los. Mailed printout  °

## 2019-02-20 NOTE — Progress Notes (Signed)
Hematology and Oncology Follow Up Visit  SHION BLUESTEIN [id number] 1956-01-21 63 y.o. 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  He presented with cutaneous involvement in multiple locations.   Past therapy:  Doxil chemotherapy given at 30 mg/m every 4 weeks the first cycle given at [facility] in June 2016.   He completed 6 cycles of therapy in November 2016. He developed relapsed disease in September 2019. Doxil 30 mg/m every 4 weeks restarted in September 2019.  He completed 6 months of therapy.  Current therapy: Active surveillance.  Interim History:  [name] presents today for a follow-up.  Since the last visit, he reports no changes in his health.  He denies any constitutional symptoms weakness fatigue or tiredness.  He denies any new skin rashes or lesions.  His previous Kaposi's lesions appeared healed at this time.  Continues to perform activities of daily living without any recent hospitalization or illnesses.  He denied any alteration mental status, neuropathy, confusion or dizziness.  Denies any headaches or lethargy.  Denies any night sweats, weight loss or changes in appetite.  Denied orthopnea, dyspnea on exertion or chest discomfort.  Denies shortness of breath, difficulty breathing hemoptysis or cough.  Denies any abdominal distention, nausea, early satiety or dyspepsia.  Denies any hematuria, frequency, dysuria or nocturia.  Denies any skin irritation, dryness or rash.  Denies any ecchymosis or petechiae.  Denies any lymphadenopathy or clotting.  Denies any heat or cold intolerance.  Denies any anxiety or depression.  Remaining review of system is negative.       Medications: Reviewed today without any changes. Current Outpatient Medications  Medication Sig Dispense Refill  . fenofibrate 160 MG tablet Take 1 tablet (160 mg total) by mouth daily. 90  tablet 3  . ferrous sulfate 325 (65 FE) MG tablet Take 1 tablet (325 mg total) by mouth daily with breakfast. 30 tablet 3  . losartan (COZAAR) 25 MG tablet Take 1 tablet (25 mg total) by mouth daily. 90 tablet 3  . metoprolol (TOPROL-XL) 200 MG 24 hr tablet Take 1 tablet (200 mg total) by mouth daily. 90 tablet 3  . Multiple Vitamin (MULTIVITAMIN) tablet Take 1 tablet by mouth daily.    . prochlorperazine (COMPAZINE) 10 MG tablet Take 1 tablet (10 mg total) by mouth every 6 (six) hours as needed for nausea or vomiting. 30 tablet 0  . rosuvastatin (CRESTOR) 40 MG tablet Take 1 tablet (40 mg total) by mouth daily. 90 tablet 3  . warfarin (COUMADIN) 5 MG tablet TAKE ONE TABLET DAILY EXCEPT TAKE ONE AND ONE-HALF TABLETS ON WEDNESDAYS OR TAKE AS DIRECTED BY ANTICOAGULATION CLINIC 120 tablet 3   No current facility-administered medications for this visit.      Allergies:  Allergies  Allergen Reactions  . Celecoxib Itching and Other (See Comments)    Joint pain    Past Medical History, Surgical history, Social history, and Family History remain without any changes on review.   Physical Exam:   Blood pressure 135/76, pulse 70, temperature 98.6 F (37 C), temperature source Oral, resp. rate 17, height 5\' 8"  (1.727 m), weight 232 lb 3.2 oz (105.3 kg), SpO2 100 %.      ECOG: 0      General appearance: Alert, awake without any distress. Head: Atraumatic without abnormalities Oropharynx: Without any thrush or ulcers. Eyes: No scleral icterus. Lymph nodes: No  lymphadenopathy noted in the cervical, supraclavicular, or axillary nodes Heart:regular rate and rhythm, without any murmurs or gallops.   Lung: Clear to auscultation without any rhonchi, wheezes or dullness to percussion. Abdomin: Soft, nontender without any shifting dullness or ascites. Musculoskeletal: No clubbing or cyanosis. Neurological: No motor or sensory deficits. Skin: Dry scaly healed lesions noted on his bilateral  lower extremities.  No new lesions noted. Psychiatric: Mood and affect appeared normal.       Lab Results: Lab Results  Component Value Date   WBC 4.4 02/20/2019   HGB 15.8 02/20/2019   HCT 47.3 02/20/2019   MCV 81.6 02/20/2019   PLT 156 02/20/2019     Chemistry      Component Value Date/Time   NA 141 08/23/2018 1004   NA 144 12/21/2016 0839   NA 143 11/03/2016 0908   K 4.6 08/23/2018 1004   K 5.0 11/03/2016 0908   CL 110 08/23/2018 1004   CO2 23 08/23/2018 1004   CO2 27 11/03/2016 0908   BUN 25 (H) 08/23/2018 1004   BUN 24 12/21/2016 0839   BUN 33.5 (H) 11/03/2016 0908   CREATININE 1.79 (H) 08/23/2018 1004   CREATININE 1.5 (H) 11/03/2016 0908      Component Value Date/Time   CALCIUM 9.0 08/23/2018 1004   CALCIUM 9.5 11/03/2016 0908   ALKPHOS 32 (L) 08/23/2018 1004   ALKPHOS 25 (L) 11/03/2016 0908   AST 29 08/23/2018 1004   AST 29 11/03/2016 0908   ALT 29 08/23/2018 1004   ALT 29 11/03/2016 0908   BILITOT 0.6 08/23/2018 1004   BILITOT 0.70 11/03/2016 0908       Impression and Plan:   63 year old man with:  1. Kaposi sarcoma with a limited cutaneous manifestation diagnosed in 2016.  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  He completed Doxil treatment on few occasions and at this time remains in remission.  The natural course of this disease and future treatment options were reiterated.  Repeating a Doxil treatment would be reasonable he develops relapsed disease.  Risks and benefits of using Doxil in the future were reviewed.  Tensional complications include cardiomyopathy, nausea and dermatological toxicity were reiterated.   2. Cardiomyopathy: He is status post Doxil with no changes in his cardiac function.  3.  IV access: He will require Port-A-Cath insertion for any future treatment given his poor vein access at this time.  5. Follow-up: In 8 months.  15  minutes was spent with the patient face-to-face today.  More  than 50% of time was spent on reviewing disease status update, treatment options and complications related to therapy.   Zola Button, MD 7/23/20209:58 AM

## 2019-02-28 ENCOUNTER — Ambulatory Visit (INDEPENDENT_AMBULATORY_CARE_PROVIDER_SITE_OTHER): Payer: BC Managed Care – PPO | Admitting: Internal Medicine

## 2019-02-28 ENCOUNTER — Other Ambulatory Visit: Payer: Self-pay

## 2019-02-28 DIAGNOSIS — Z20828 Contact with and (suspected) exposure to other viral communicable diseases: Secondary | ICD-10-CM

## 2019-02-28 DIAGNOSIS — R739 Hyperglycemia, unspecified: Secondary | ICD-10-CM

## 2019-02-28 DIAGNOSIS — R509 Fever, unspecified: Secondary | ICD-10-CM | POA: Diagnosis not present

## 2019-02-28 DIAGNOSIS — Z20822 Contact with and (suspected) exposure to covid-19: Secondary | ICD-10-CM

## 2019-02-28 DIAGNOSIS — R6889 Other general symptoms and signs: Secondary | ICD-10-CM | POA: Diagnosis not present

## 2019-02-28 NOTE — Assessment & Plan Note (Signed)
Exam c/w viral illness vs ? Prostatitis, for COVID testing now, and consider empiric antibx trial for prostate

## 2019-02-28 NOTE — Assessment & Plan Note (Signed)
For covid as above

## 2019-02-28 NOTE — Patient Instructions (Signed)
Please go to the [facility] testing site for COVID testing.  Please continue all other medications as before, and refills have been done if requested.  Please have the pharmacy call with any other refills you may need.  Please keep your appointments with your specialists as you may have planned

## 2019-02-28 NOTE — Assessment & Plan Note (Signed)
stable overall by history and exam, recent data reviewed with pt, and pt to continue medical treatment as before,  to f/u any worsening symptoms or concerns  

## 2019-02-28 NOTE — Progress Notes (Signed)
Patient ID: Austin [name], male   DOB: Mar 01, 1956, 63 y.o.   MRN: [id number]  Virtual Visit via Video Note  I connected with [name] on 02/28/19 at  9:00 AM EDT by a video enabled telemedicine application and verified that I am speaking with the correct person using two identifiers.  Location: Patient: at home Provider: at office   I discussed the limitations of evaluation and management by telemedicine and the availability of in person appointments. The patient expressed understanding and agreed to proceed.  History of Present Illness: Here with acute onset febrile illness with temp to 100.5, fatigue, sleepiness, lower appetite, rare non prod cough, mild sob and unusual mild doe with stairs; taste and smell are intact, and no ear, sinus or ST pain.  No diarrhea.  May have mild urinary frequency x 2 days for unclear reasons but Denies urinary symptoms such as dysuria, urgency, flank pain, hematuria or n/v, fever, chills. No sick contacts or known covid testing.  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   Only really goes to grocery otherwise.   Pt denies polydipsia, polyuria,  Past Medical History:  Diagnosis Date  . Anemia    iron def  . Aortic valve prosthesis present    a. SJM - [facility] 2003, chronic coumadin.  . Back pain   . CKD (chronic kidney disease), stage II   . Coronary artery disease    not sure where this dx came from - pt denies  . Family history of melanoma   . Hearing loss   . Hyperlipidemia   . Hypertension   . Internal hemorrhoid   . Kaposi sarcoma ([place])   . Obesity   . Pericardial effusion   . Thoracic aortic aneurysm [facility])    a. s/p dissection and repair @ [facility] 2003 with SJM AVR with residual findings followed by CT.  Marland Kitchen Thyroid disease    Past Surgical History:  Procedure Laterality Date  . ABDOMINAL AORTIC ANEURYSM REPAIR    . AORTIC VALVE REPLACEMENT    . impingment     right shoulder  . KNEE ARTHROSCOPY     left  . WISDOM TOOTH  EXTRACTION      reports that he quit smoking about 17 years ago. His smoking use included cigarettes. He has a 15.00 pack-year smoking history. He has never used smokeless tobacco. He reports current alcohol use. He reports that he does not use drugs. family history includes Heart disease in his paternal aunt; Liver disease in his sister; Melanoma in his maternal uncle; Other in his maternal grandfather; Stroke in his mother; Stroke (age of onset: 66) in his father. Allergies  Allergen Reactions  . Celecoxib Itching and Other (See Comments)    Joint pain   Current Outpatient Medications on File Prior to Visit  Medication Sig Dispense Refill  . fenofibrate 160 MG tablet Take 1 tablet (160 mg total) by mouth daily. 90 tablet 3  . ferrous sulfate 325 (65 FE) MG tablet Take 1 tablet (325 mg total) by mouth daily with breakfast. 30 tablet 3  . losartan (COZAAR) 25 MG tablet Take 1 tablet (25 mg total) by mouth daily. 90 tablet 3  . metoprolol (TOPROL-XL) 200 MG 24 hr tablet Take 1 tablet (200 mg total) by mouth daily. 90 tablet 3  . Multiple Vitamin (MULTIVITAMIN) tablet Take 1 tablet by mouth daily.    . prochlorperazine (COMPAZINE) 10 MG tablet Take 1 tablet (10 mg total) by mouth every 6 (  six) hours as needed for nausea or vomiting. 30 tablet 0  . rosuvastatin (CRESTOR) 40 MG tablet Take 1 tablet (40 mg total) by mouth daily. 90 tablet 3  . warfarin (COUMADIN) 5 MG tablet TAKE ONE TABLET DAILY EXCEPT TAKE ONE AND ONE-HALF TABLETS ON WEDNESDAYS OR TAKE AS DIRECTED BY ANTICOAGULATION CLINIC 120 tablet 3   No current facility-administered medications on file prior to visit.    Observations/Objective: Alert, NAD, mild ill appaering, appropriate mood and affect, resps normal, cn 2-12 intact, moves all 4s, no visible rash or swelling Lab Results  Component Value Date   WBC 4.4 02/20/2019   HGB 15.8 02/20/2019   HCT 47.3 02/20/2019   PLT 156 02/20/2019   GLUCOSE 220 (H) 02/20/2019   CHOL 165  09/13/2018   TRIG 163.0 (H) 09/13/2018   HDL 44.40 09/13/2018   LDLDIRECT 114.0 09/23/2014   LDLCALC 88 09/13/2018   ALT 26 02/20/2019   AST 25 02/20/2019   NA 141 02/20/2019   K 4.3 02/20/2019   CL 111 02/20/2019   CREATININE 1.48 (H) 02/20/2019   BUN 24 (H) 02/20/2019   CO2 23 02/20/2019   TSH 1.53 09/13/2018   PSA 1.96 09/13/2018   INR 3.2 (A) 02/04/2019   HGBA1C 6.4 09/13/2018   Assessment and Plan: See notes  Follow Up Instructions: See notes   I discussed the assessment and treatment plan with the patient. The patient was provided an opportunity to ask questions and all were answered. The patient agreed with the plan and demonstrated an understanding of the instructions.   The patient was advised to call back or seek an in-person evaluation if the symptoms worsen or if the condition fails to improve as anticipated.   Cathlean Cower, MD

## 2019-03-02 LAB — NOVEL CORONAVIRUS, NAA: SARS-CoV-2, NAA: NOT DETECTED

## 2019-03-03 ENCOUNTER — Telehealth: Payer: Self-pay

## 2019-03-03 NOTE — Telephone Encounter (Signed)
Pt has been informed of results and expressed understanding.  °

## 2019-03-03 NOTE — Telephone Encounter (Signed)
-----   Message from [name], MD sent at 03/02/2019  6:44 PM EDT ----- Left message on MyChart, pt to cont same tx  Shirron to please inform pt, COVID is neg

## 2019-03-18 ENCOUNTER — Ambulatory Visit (INDEPENDENT_AMBULATORY_CARE_PROVIDER_SITE_OTHER): Payer: BC Managed Care – PPO | Admitting: General Practice

## 2019-03-18 ENCOUNTER — Other Ambulatory Visit: Payer: Self-pay

## 2019-03-18 DIAGNOSIS — Z7901 Long term (current) use of anticoagulants: Secondary | ICD-10-CM

## 2019-03-18 DIAGNOSIS — Z952 Presence of prosthetic heart valve: Secondary | ICD-10-CM

## 2019-03-18 LAB — POCT INR: INR: 5.9 — AB (ref 2.0–3.0)

## 2019-03-18 NOTE — Patient Instructions (Addendum)
Pre visit review using our clinic review tool, if applicable. No additional management support is needed unless otherwise documented below in the visit note.  Hold coumadin through Thursday.  On Friday change dosage and take 1 tablet daily.  Re-check in 2 weeks.

## 2019-04-01 ENCOUNTER — Other Ambulatory Visit: Payer: Self-pay

## 2019-04-01 ENCOUNTER — Ambulatory Visit (INDEPENDENT_AMBULATORY_CARE_PROVIDER_SITE_OTHER): Payer: BC Managed Care – PPO | Admitting: General Practice

## 2019-04-01 DIAGNOSIS — Z952 Presence of prosthetic heart valve: Secondary | ICD-10-CM

## 2019-04-01 DIAGNOSIS — Z7901 Long term (current) use of anticoagulants: Secondary | ICD-10-CM | POA: Diagnosis not present

## 2019-04-01 LAB — POCT INR: INR: 3.4 — AB (ref 2.0–3.0)

## 2019-04-01 NOTE — Patient Instructions (Addendum)
Pre visit review using our clinic review tool, if applicable. No additional management support is needed unless otherwise documented below in the visit note.  Hold coumadin today and change dosage to 1 tablet daily except 1/2 on tuesdays.  Re-check in 3 weeks.

## 2019-04-22 ENCOUNTER — Ambulatory Visit (INDEPENDENT_AMBULATORY_CARE_PROVIDER_SITE_OTHER): Payer: BC Managed Care – PPO | Admitting: General Practice

## 2019-04-22 ENCOUNTER — Other Ambulatory Visit: Payer: Self-pay

## 2019-04-22 DIAGNOSIS — Z7901 Long term (current) use of anticoagulants: Secondary | ICD-10-CM | POA: Diagnosis not present

## 2019-04-22 DIAGNOSIS — Z952 Presence of prosthetic heart valve: Secondary | ICD-10-CM

## 2019-04-22 DIAGNOSIS — Z23 Encounter for immunization: Secondary | ICD-10-CM | POA: Diagnosis not present

## 2019-04-22 LAB — POCT INR: INR: 3 (ref 2.0–3.0)

## 2019-04-22 NOTE — Patient Instructions (Signed)
Pre visit review using our clinic review tool, if applicable. No additional management support is needed unless otherwise documented below in the visit note.  Change dosage and take 1 tablet daily except take 1/2 tablet on Tuesday and Fridays.  Re-check in 4 weeks.

## 2019-04-22 NOTE — Progress Notes (Signed)
Medical screening examination/treatment/procedure(s) were performed by non-physician practitioner and as supervising physician I was immediately available for consultation/collaboration. I agree with above. James [name], MD   

## 2019-05-02 DIAGNOSIS — H903 Sensorineural hearing loss, bilateral: Secondary | ICD-10-CM | POA: Diagnosis not present

## 2019-05-20 ENCOUNTER — Other Ambulatory Visit: Payer: Self-pay

## 2019-05-20 ENCOUNTER — Ambulatory Visit (INDEPENDENT_AMBULATORY_CARE_PROVIDER_SITE_OTHER): Payer: BC Managed Care – PPO | Admitting: General Practice

## 2019-05-20 DIAGNOSIS — Z7901 Long term (current) use of anticoagulants: Secondary | ICD-10-CM

## 2019-05-20 DIAGNOSIS — Z952 Presence of prosthetic heart valve: Secondary | ICD-10-CM

## 2019-05-20 LAB — POCT INR: INR: 2.2 (ref 2.0–3.0)

## 2019-05-20 NOTE — Patient Instructions (Addendum)
Pre visit review using our clinic review tool, if applicable. No additional management support is needed unless otherwise documented below in the visit note.  Continue to take 1 tablet daily except take 1/2 tablet on Tuesday and Fridays.  Re-check in 4 weeks.

## 2019-05-25 NOTE — Progress Notes (Signed)
Chief Complaint  Patient presents with  . Follow-up    AVR      History of Present Illness: 63 yo male with history of HTN, HLD, ascending aortic aneursym/dissection with repair at [facility] in 2003 with St. 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 His surgery was at [facility] in 2003. He has had a residual thoracic and abdominal aortic dissection since then. Imaging showed large dissection extending from ascending aorta down into the abdominal aorta and iliacs. The false lumen in the descending thoracic aortic aneurysm is larger than the true lumen. This has been stable for years and CT surgery does not think surgical correction is indicated. There is some involvement of the arch vessels and renal arteries. He was also found to have a right lung nodule and lesions in the liver and kidneys that were hard to characterize. Echo May 2016 at [facility] with normal LV function, normally functioning AVR. He is on chronic coumadin for anticoagulation after mechanical AVR.  CTA chest and abdomen May 2016 at [facility] and stable. 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 He has been seen in [facility], [facility] and is followed in [place]. He is HIV negative. He has completed chemotherapy.  Echo August 2019 with normal LV systolic function, normally functioning mechanical AVR, mild MR. CTA chest June 2018 with stable chronic aortic dissection.   He is here today for follow up. The patient denies any chest pain, dyspnea, palpitations, lower extremity edema, orthopnea, PND, dizziness, near syncope or syncope.    Primary Care Physician: [name], MD  Past Medical History:  Diagnosis Date  . Anemia    iron def  . Aortic valve prosthesis present    a. SJM - [facility] 2003, chronic coumadin.  . Back pain   . CKD (chronic kidney disease), stage II   . Coronary artery disease    not sure where this dx came from - pt denies   . Family history of melanoma   . Hearing loss   . Hyperlipidemia   . Hypertension   . Internal hemorrhoid   . Kaposi sarcoma ([place])   . Obesity   . Pericardial effusion   . Thoracic aortic aneurysm [facility])    a. s/p dissection and repair @ [facility] 2003 with SJM AVR with residual findings followed by CT.  Marland Kitchen Thyroid disease     Past Surgical History:  Procedure Laterality Date  . ABDOMINAL AORTIC ANEURYSM REPAIR    . AORTIC VALVE REPLACEMENT    . impingment     right shoulder  . KNEE ARTHROSCOPY     left  . WISDOM TOOTH EXTRACTION      Current Outpatient Medications  Medication Sig Dispense Refill  . fenofibrate 160 MG tablet Take 1 tablet (160 mg total) by mouth daily. 90 tablet 3  . ferrous sulfate 325 (65 FE) MG tablet Take 1 tablet (325 mg total) by mouth daily with breakfast. 30 tablet 3  . losartan (COZAAR) 25 MG tablet Take 1 tablet (25 mg total) by mouth daily. 90 tablet 3  . metoprolol (TOPROL-XL) 200 MG 24 hr tablet Take 1 tablet (200 mg total) by mouth daily. 90 tablet 3  . Multiple Vitamin (MULTIVITAMIN) tablet Take 1 tablet by mouth daily.    . rosuvastatin (CRESTOR) 40 MG tablet Take 1 tablet (40 mg total) by mouth daily. 90 tablet 3  . warfarin (COUMADIN) 5 MG tablet TAKE ONE  TABLET DAILY EXCEPT TAKE ONE AND ONE-HALF TABLETS ON WEDNESDAYS OR TAKE AS DIRECTED BY ANTICOAGULATION CLINIC 120 tablet 3   No current facility-administered medications for this visit.     Allergies  Allergen Reactions  . Celecoxib Itching and Other (See Comments)    Joint pain    Social History   Socioeconomic History  . Marital status: Single    Spouse name: Not on file  . Number of children: 0  . Years of education: MDIV  . Highest education level: Not on file  Occupational History  . Occupation: Civil Service fast streamer: [employer]  Social Needs  . Financial resource strain: Not on file  . Food insecurity    Worry: Not on file    Inability: Not on file  .  Transportation needs    Medical: Not on file    Non-medical: Not on file  Tobacco Use  . Smoking status: Former Smoker    Packs/day: 1.00    Years: 15.00    Pack years: 15.00    Types: Cigarettes    Quit date: 10/10/2001    Years since quitting: 17.6  . Smokeless tobacco: Never Used  Substance and Sexual Activity  . Alcohol use: Yes    Comment: one drink per day  . Drug use: No  . Sexual activity: Not Currently  Lifestyle  . Physical activity    Days per week: Not on file    Minutes per session: Not on file  . Stress: Not on file  Relationships  . Social Herbalist on phone: Not on file    Gets together: Not on file    Attends religious service: Not on file    Active member of club or organization: Not on file    Attends meetings of clubs or organizations: Not on file    Relationship status: Not on file  . Intimate partner violence    Fear of current or ex partner: Not on file    Emotionally abused: Not on file    Physically abused: Not on file    Forced sexual activity: Not on file  Other Topics Concern  . Not on file  Social History Narrative   Pt lives at home alone.   Caffeine Use: Less than 1 cup daily.    Family History  Problem Relation Age of Onset  . Stroke Mother        late 5s  . Stroke Father 98  . Liver disease Sister        Sclerosing cholegitis necessitating a liver transplant  . Melanoma Maternal Uncle        dx in his 22s  . Heart disease Paternal Aunt   . Other Maternal Grandfather        died under the age of 58 due to a health related issue  . Colon cancer Neg Hx     Review of Systems:  As stated in the HPI and otherwise negative.   BP 130/78   Pulse 64   Ht 5\' 8"  (1.727 m)   Wt 232 lb (105.2 kg)   SpO2 97%   BMI 35.28 kg/m   Physical Examination:  General: Well developed, well nourished, NAD  HEENT: OP clear, mucus membranes moist  SKIN: warm, dry. No rashes. Neuro: No focal deficits  Musculoskeletal: Muscle  strength 5/5 all ext  Psychiatric: Mood and affect normal  Neck: No JVD, no carotid bruits, no thyromegaly, no lymphadenopathy.  Lungs:Clear bilaterally,  no wheezes, rhonci, crackles Cardiovascular: Regular rate and rhythm. Valve click. Systolic murmur.  Abdomen:Soft. Bowel sounds present. Non-tender.  Extremities: No lower extremity edema. Pulses are 2 + in the bilateral DP/PT.  EKG:  EKG is not ordered today. The ekg ordered today demonstrates   Echo August 2019: Left ventricle: The cavity size was normal. There was mild   concentric and severe asymmetric hypertrophy. Systolic function   was normal. Wall motion was normal; there were no regional wall   motion abnormalities. Features are consistent with a pseudonormal   left ventricular filling pattern, with concomitant abnormal   relaxation and increased filling pressure (grade 2 diastolic   dysfunction). Doppler parameters are consistent with high   ventricular filling pressure. - Aortic valve: A St. Jude Medical mechanical prosthesis was   present and functioning normally. Mean gradient (S): 18 mm Hg. - Aorta: Aortic root dimension: 39 mm (ED). Ascending aortic   diameter: 40 mm (S). - Aortic root: The aortic root was mildly dilated. - Ascending aorta: The ascending aorta was mildly dilated. - Mitral valve: There was mild regurgitation. - Left atrium: The atrium was mildly dilated. - Right ventricle: Systolic function was moderately reduced.  Recent Labs: 09/13/2018: TSH 1.53 02/20/2019: ALT 26; BUN 24; Creatinine 1.48; Hemoglobin 15.8; Platelet Count 156; Potassium 4.3; Sodium 141   Lipid Panel    Component Value Date/Time   CHOL 165 09/13/2018 0901   TRIG 163.0 (H) 09/13/2018 0901   HDL 44.40 09/13/2018 0901   CHOLHDL 4 09/13/2018 0901   VLDL 32.6 09/13/2018 0901   LDLCALC 88 09/13/2018 0901   LDLDIRECT 114.0 09/23/2014 0955     Wt Readings from Last 3 Encounters:  05/26/19 232 lb (105.2 kg)  02/20/19 232 lb 3.2 oz  (105.3 kg)  09/13/18 224 lb (101.6 kg)     Other studies Reviewed: Additional studies/ records that were reviewed today include: . Review of the above records demonstrates:    Assessment and Plan:   1. THORACIC AORTIC ANEURYSM/DISSECTION: He is known to have a type A aortic dissection with extension from his thoracic aorta throughout both iliac arteries affecting the right renal artery but his has been stable. Stable by chest CTA June 2018. Will repeat CTA chest/abdomen and pelvis now. BMET today  2. S/P aortic valve replacement: Mechanical valve functioning well by echo August 2019.   3. Mitral regurgitation: mild by echo 2019  4. HTN: BP is well controlled.   Current medicines are reviewed at length with the patient today.  The patient does not have concerns regarding medicines.  The following changes have been made:  no change  Labs/ tests ordered today include:   Orders Placed This Encounter  Procedures  . CT Angio Chest/Abd/Pel for Dissection W and/or W/WO  . Basic metabolic panel   Disposition:   FU with me in 12  months  Signed, Lauree Chandler, MD 05/26/2019 9:54 AM    [facility] [place], [place], [place]  [address] Phone: [phone]; Fax: [phone]

## 2019-05-26 ENCOUNTER — Other Ambulatory Visit: Payer: Self-pay

## 2019-05-26 ENCOUNTER — Ambulatory Visit (INDEPENDENT_AMBULATORY_CARE_PROVIDER_SITE_OTHER): Payer: BC Managed Care – PPO | Admitting: Cardiovascular Disease

## 2019-05-26 ENCOUNTER — Encounter: Payer: Self-pay | Admitting: Cardiovascular Disease

## 2019-05-26 VITALS — BP 130/78 | HR 64 | Ht 68.0 in | Wt 232.0 lb

## 2019-05-26 DIAGNOSIS — I712 Thoracic aortic aneurysm, without rupture, unspecified: Secondary | ICD-10-CM

## 2019-05-26 DIAGNOSIS — Z952 Presence of prosthetic heart valve: Secondary | ICD-10-CM

## 2019-05-26 DIAGNOSIS — I10 Essential (primary) hypertension: Secondary | ICD-10-CM

## 2019-05-26 LAB — BASIC METABOLIC PANEL
BUN/Creatinine Ratio: 15 (ref 10–24)
BUN: 24 mg/dL (ref 8–27)
CO2: 23 mmol/L (ref 20–29)
Calcium: 9.5 mg/dL (ref 8.6–10.2)
Chloride: 108 mmol/L — ABNORMAL HIGH (ref 96–106)
Creatinine, Ser: 1.58 mg/dL — ABNORMAL HIGH (ref 0.76–1.27)
GFR calc Af Amer: 53 mL/min/{1.73_m2} — ABNORMAL LOW (ref >59)
GFR calc non Af Amer: 46 mL/min/{1.73_m2} — ABNORMAL LOW (ref >59)
Glucose: 111 mg/dL — ABNORMAL HIGH (ref 65–99)
Potassium: 4.6 mmol/L (ref 3.5–5.2)
Sodium: 142 mmol/L (ref 134–144)

## 2019-05-26 NOTE — Patient Instructions (Signed)
Medication Instructions:  No changes *If you need a refill on your cardiac medications before your next appointment, please call your pharmacy*  Lab Work: Today: BMET If you have labs (blood work) drawn today and your tests are completely normal, you will receive your results only by: Marland Kitchen MyChart Message (if you have MyChart) OR . A paper copy in the mail If you have any lab test that is abnormal or we need to change your treatment, we will call you to review the results.  Testing/Procedures: Non-Cardiac CT scanning, (CAT scanning), is a noninvasive, special x-ray that produces cross-sectional images of the body using x-rays and a computer. CT scans help physicians diagnose and treat medical conditions. For some CT exams, a contrast material is used to enhance visibility in the area of the body being studied. CT scans provide greater clarity and reveal more details than regular x-ray exams. CHEST/ABD/PELVIS - CTA   Follow-Up: At [facility], you and your health needs are our priority.  As part of our continuing mission to provide you with exceptional heart care, we have created designated Provider Care Teams.  These Care Teams include your primary Cardiologist (physician) and Advanced Practice Providers (APPs -  Physician Assistants and Nurse Practitioners) who all work together to provide you with the care you need, when you need it.  Your next appointment:   12 months  The format for your next appointment:   In Person  Provider:   Lauree Chandler, MD  Other Instructions

## 2019-06-13 ENCOUNTER — Other Ambulatory Visit: Payer: Self-pay

## 2019-06-13 ENCOUNTER — Ambulatory Visit (INDEPENDENT_AMBULATORY_CARE_PROVIDER_SITE_OTHER)
Admission: RE | Admit: 2019-06-13 | Discharge: 2019-06-13 | Disposition: A | Discharge status: Home / Self Care | Payer: BC Managed Care – PPO | Source: Ambulatory Visit | Attending: Cardiovascular Disease | Admitting: Cardiovascular Disease

## 2019-06-13 DIAGNOSIS — I712 Thoracic aortic aneurysm, without rupture, unspecified: Secondary | ICD-10-CM

## 2019-06-13 DIAGNOSIS — I10 Essential (primary) hypertension: Secondary | ICD-10-CM | POA: Diagnosis not present

## 2019-06-13 DIAGNOSIS — K760 Fatty (change of) liver, not elsewhere classified: Secondary | ICD-10-CM | POA: Diagnosis not present

## 2019-06-13 DIAGNOSIS — Z952 Presence of prosthetic heart valve: Secondary | ICD-10-CM | POA: Diagnosis not present

## 2019-06-13 MED ORDER — IOHEXOL 350 MG/ML SOLN
100.0000 mL | Freq: Once | INTRAVENOUS | Status: AC | PRN
  Administered 2019-06-13: 100 mL via INTRAVENOUS

## 2019-06-17 ENCOUNTER — Ambulatory Visit: Payer: BC Managed Care – PPO

## 2019-06-20 ENCOUNTER — Other Ambulatory Visit: Payer: Self-pay

## 2019-06-20 ENCOUNTER — Ambulatory Visit (INDEPENDENT_AMBULATORY_CARE_PROVIDER_SITE_OTHER): Payer: BC Managed Care – PPO | Admitting: General Practice

## 2019-06-20 DIAGNOSIS — Z7901 Long term (current) use of anticoagulants: Secondary | ICD-10-CM

## 2019-06-20 DIAGNOSIS — Z952 Presence of prosthetic heart valve: Secondary | ICD-10-CM

## 2019-06-20 LAB — POCT INR: INR: 2.3 (ref 2.0–3.0)

## 2019-06-20 NOTE — Progress Notes (Signed)
Medical screening examination/treatment/procedure(s) were performed by non-physician practitioner and as supervising physician I was immediately available for consultation/collaboration. I agree with above. Fryda Molenda, MD   

## 2019-06-20 NOTE — Patient Instructions (Addendum)
Pre visit review using our clinic review tool, if applicable. No additional management support is needed unless otherwise documented below in the visit note.  Continue to take 1 tablet daily except take 1/2 tablet on Tuesday and Fridays.  Re-check in 4 weeks.

## 2019-07-31 ENCOUNTER — Ambulatory Visit: Payer: BC Managed Care – PPO | Admitting: Cardiovascular Disease

## 2019-08-04 DIAGNOSIS — Z03818 Encounter for observation for suspected exposure to other biological agents ruled out: Secondary | ICD-10-CM | POA: Diagnosis not present

## 2019-08-08 ENCOUNTER — Other Ambulatory Visit: Payer: Self-pay

## 2019-08-08 ENCOUNTER — Ambulatory Visit (INDEPENDENT_AMBULATORY_CARE_PROVIDER_SITE_OTHER): Payer: BC Managed Care – PPO | Admitting: General Practice

## 2019-08-08 DIAGNOSIS — Z952 Presence of prosthetic heart valve: Secondary | ICD-10-CM | POA: Diagnosis not present

## 2019-08-08 DIAGNOSIS — Z7901 Long term (current) use of anticoagulants: Secondary | ICD-10-CM | POA: Diagnosis not present

## 2019-08-08 LAB — POCT INR: INR: 1.9 — AB (ref 2.0–3.0)

## 2019-08-08 NOTE — Progress Notes (Signed)
Medical screening examination/treatment/procedure(s) were performed by non-physician practitioner and as supervising physician I was immediately available for consultation/collaboration. I agree with above. Bhavya Grand, MD   

## 2019-08-08 NOTE — Patient Instructions (Signed)
.  lbpcmh

## 2019-08-16 ENCOUNTER — Other Ambulatory Visit: Payer: Self-pay | Admitting: Internal Medicine

## 2019-08-16 NOTE — Telephone Encounter (Signed)
Please refill as per office routine med refill policy (all routine meds refilled for 3 mo or monthly per pt preference up to one year from last visit, then month to month grace period for 3 mo, then further med refills will have to be denied)  

## 2019-09-05 ENCOUNTER — Ambulatory Visit: Payer: BC Managed Care – PPO

## 2019-09-05 ENCOUNTER — Other Ambulatory Visit: Payer: Self-pay | Admitting: Internal Medicine

## 2019-09-09 ENCOUNTER — Other Ambulatory Visit: Payer: Self-pay

## 2019-09-09 ENCOUNTER — Ambulatory Visit (INDEPENDENT_AMBULATORY_CARE_PROVIDER_SITE_OTHER): Payer: BC Managed Care – PPO | Admitting: General Practice

## 2019-09-09 DIAGNOSIS — Z7901 Long term (current) use of anticoagulants: Secondary | ICD-10-CM | POA: Diagnosis not present

## 2019-09-09 DIAGNOSIS — Z952 Presence of prosthetic heart valve: Secondary | ICD-10-CM

## 2019-09-09 LAB — POCT INR: INR: 2.1 (ref 2.0–3.0)

## 2019-09-09 NOTE — Progress Notes (Signed)
Medical screening examination/treatment/procedure(s) were performed by non-physician practitioner and as supervising physician I was immediately available for consultation/collaboration. I agree with above. Suhaan Perleberg, MD   

## 2019-09-09 NOTE — Patient Instructions (Addendum)
Pre visit review using our clinic review tool, if applicable. No additional management support is needed unless otherwise documented below in the visit note.  Continue to take 1 tablet daily except take 1/2 tablet on Tuesday and Fridays.  Re-check in 6 weeks.

## 2019-09-17 ENCOUNTER — Ambulatory Visit (INDEPENDENT_AMBULATORY_CARE_PROVIDER_SITE_OTHER): Payer: BC Managed Care – PPO | Admitting: Internal Medicine

## 2019-09-17 ENCOUNTER — Encounter: Payer: Self-pay | Admitting: Internal Medicine

## 2019-09-17 ENCOUNTER — Other Ambulatory Visit: Payer: Self-pay

## 2019-09-17 VITALS — BP 110/66 | Temp 99.4°F | Ht 68.0 in | Wt 238.2 lb

## 2019-09-17 DIAGNOSIS — Z0000 Encounter for general adult medical examination without abnormal findings: Secondary | ICD-10-CM | POA: Diagnosis not present

## 2019-09-17 DIAGNOSIS — E538 Deficiency of other specified B group vitamins: Secondary | ICD-10-CM

## 2019-09-17 DIAGNOSIS — E611 Iron deficiency: Secondary | ICD-10-CM

## 2019-09-17 DIAGNOSIS — E559 Vitamin D deficiency, unspecified: Secondary | ICD-10-CM

## 2019-09-17 DIAGNOSIS — R739 Hyperglycemia, unspecified: Secondary | ICD-10-CM | POA: Diagnosis not present

## 2019-09-17 DIAGNOSIS — Z23 Encounter for immunization: Secondary | ICD-10-CM

## 2019-09-17 LAB — BASIC METABOLIC PANEL
BUN: 31 mg/dL — ABNORMAL HIGH (ref 6–23)
CO2: 29 mEq/L (ref 19–32)
Calcium: 9.5 mg/dL (ref 8.4–10.5)
Chloride: 105 mEq/L (ref 96–112)
Creatinine, Ser: 1.45 mg/dL (ref 0.40–1.50)
GFR: 59.21 mL/min — ABNORMAL LOW (ref >60.00)
Glucose, Bld: 197 mg/dL — ABNORMAL HIGH (ref 70–99)
Potassium: 4.6 mEq/L (ref 3.5–5.1)
Sodium: 139 mEq/L (ref 135–145)

## 2019-09-17 LAB — URINALYSIS, ROUTINE W REFLEX MICROSCOPIC
Bilirubin Urine: NEGATIVE
Hgb urine dipstick: NEGATIVE
Ketones, ur: NEGATIVE
Leukocytes,Ua: NEGATIVE
Nitrite: NEGATIVE
RBC / HPF: NONE SEEN (ref 0–?)
Specific Gravity, Urine: 1.025 (ref 1.000–1.030)
Total Protein, Urine: 100 — AB
Urine Glucose: 250 — AB
Urobilinogen, UA: 0.2 (ref 0.0–1.0)
pH: 6 (ref 5.0–8.0)

## 2019-09-17 LAB — HEMOGLOBIN A1C: Hgb A1c MFr Bld: 6 % (ref 4.6–6.5)

## 2019-09-17 LAB — CBC WITH DIFFERENTIAL/PLATELET
Basophils Absolute: 0 10*3/uL (ref 0.0–0.1)
Basophils Relative: 0.6 % (ref 0.0–3.0)
Eosinophils Absolute: 0.1 10*3/uL (ref 0.0–0.7)
Eosinophils Relative: 2.3 % (ref 0.0–5.0)
HCT: 44.6 % (ref 39.0–52.0)
Hemoglobin: 15.3 g/dL (ref 13.0–17.0)
Lymphocytes Relative: 28.3 % (ref 12.0–46.0)
Lymphs Abs: 1.2 10*3/uL (ref 0.7–4.0)
MCHC: 34.2 g/dL (ref 30.0–36.0)
MCV: 84.9 fl (ref 78.0–100.0)
Monocytes Absolute: 0.4 10*3/uL (ref 0.1–1.0)
Monocytes Relative: 9.5 % (ref 3.0–12.0)
Neutro Abs: 2.6 10*3/uL (ref 1.4–7.7)
Neutrophils Relative %: 59.3 % (ref 43.0–77.0)
Platelets: 148 10*3/uL — ABNORMAL LOW (ref 150.0–400.0)
RBC: 5.26 Mil/uL (ref 4.22–5.81)
RDW: 14.4 % (ref 11.5–15.5)
WBC: 4.4 10*3/uL (ref 4.0–10.5)

## 2019-09-17 LAB — LIPID PANEL
Cholesterol: 196 mg/dL (ref 0–200)
HDL: 44.5 mg/dL (ref >39.00)
NonHDL: 151.93
Total CHOL/HDL Ratio: 4
Triglycerides: 214 mg/dL — ABNORMAL HIGH (ref 0.0–149.0)
VLDL: 42.8 mg/dL — ABNORMAL HIGH (ref 0.0–40.0)

## 2019-09-17 LAB — VITAMIN B12: Vitamin B-12: 632 pg/mL (ref 211–911)

## 2019-09-17 LAB — VITAMIN D 25 HYDROXY (VIT D DEFICIENCY, FRACTURES): VITD: 43.84 ng/mL (ref 30.00–100.00)

## 2019-09-17 LAB — HEPATIC FUNCTION PANEL
ALT: 32 U/L (ref 0–53)
AST: 26 U/L (ref 0–37)
Albumin: 4.5 g/dL (ref 3.5–5.2)
Alkaline Phosphatase: 28 U/L — ABNORMAL LOW (ref 39–117)
Bilirubin, Direct: 0.1 mg/dL (ref 0.0–0.3)
Total Bilirubin: 0.7 mg/dL (ref 0.2–1.2)
Total Protein: 6.6 g/dL (ref 6.0–8.3)

## 2019-09-17 LAB — IBC PANEL
Iron: 122 ug/dL (ref 42–165)
Saturation Ratios: 32.3 % (ref 20.0–50.0)
Transferrin: 270 mg/dL (ref 212.0–360.0)

## 2019-09-17 LAB — TSH: TSH: 4.55 u[IU]/mL — ABNORMAL HIGH (ref 0.35–4.50)

## 2019-09-17 LAB — PSA: PSA: 1.64 ng/mL (ref 0.10–4.00)

## 2019-09-17 LAB — LDL CHOLESTEROL, DIRECT: Direct LDL: 117 mg/dL

## 2019-09-17 NOTE — Addendum Note (Signed)
Addended by: Raliegh Ip on: 09/17/2019 10:09 AM   Modules accepted: Orders

## 2019-09-17 NOTE — Assessment & Plan Note (Signed)
stable overall by history and exam, recent data reviewed with pt, and pt to continue medical treatment as before,  to f/u any worsening symptoms or concerns  

## 2019-09-17 NOTE — Assessment & Plan Note (Signed)

## 2019-09-17 NOTE — Patient Instructions (Addendum)
You had the Tdap tetanus shot today  Please continue the plan for the COVID shots next month  After the COVID shots, please call for a nurse visit appt for the Shingrix (shingles) shot  Please continue all other medications as before, and refills have been done if requested.  Please have the pharmacy call with any other refills you may need.  Please continue your efforts at being more active, low cholesterol diet, and weight control.  You are otherwise up to date with prevention measures today.  Please keep your appointments with your specialists as you may have planned  Please go to the LAB at the blood drawing area for the tests to be done  You will be contacted by phone if any changes need to be made immediately.  Otherwise, you will receive a letter about your results with an explanation, but please check with MyChart first.  Please remember to sign up for MyChart if you have not done so, as this will be important to you in the future with finding out test results, communicating by private email, and scheduling acute appointments online when needed.  Please make an Appointment to return for your 1 year visit, or sooner if needed, with Lab testing by Appointment as well, to be done about 3-5 days before at the [facility] (so this is for TWO appointments - please see the scheduling desk as you leave)

## 2019-09-17 NOTE — Addendum Note (Signed)
Addended by: [name] on: 09/17/2019 11:25 PM   Modules accepted: Orders

## 2019-09-17 NOTE — Addendum Note (Signed)
Addended by: Tyrone Apple on: 09/17/2019 10:13 AM   Modules accepted: Orders

## 2019-09-17 NOTE — Progress Notes (Signed)
Subjective:    Patient ID: Austin [name], male    DOB: 01/21/1956, 64 y.o.   MRN: [id number]  HPI  Here for wellness and f/u;  Overall doing ok;  Pt denies Chest pain, worsening SOB, DOE, wheezing, orthopnea, PND, worsening LE edema, palpitations, dizziness or syncope.  Pt denies neurological change such as new headache, facial or extremity weakness.  Pt denies polydipsia, polyuria, or low sugar symptoms. Pt states overall good compliance with treatment and medications, good tolerability, and has been trying to follow appropriate diet.  Pt denies worsening depressive symptoms, suicidal ideation or panic. No fever, night sweats, wt loss, loss of appetite, or other constitutional symptoms.  Pt states good ability with ADL's, has low fall risk, home safety reviewed and adequate, no other significant changes in hearing or vision, and only occasionally active with exercise. To see renal next wk, and cardiology soon as well Wt Readings from Last 3 Encounters:  09/17/19 238 lb 3.2 oz (108 kg)  05/26/19 232 lb (105.2 kg)  02/20/19 232 lb 3.2 oz (105.3 kg)   Past Medical History:  Diagnosis Date  . Anemia    iron def  . Aortic valve prosthesis present    a. SJM - [facility] 2003, chronic coumadin.  . Back pain   . CKD (chronic kidney disease), stage II   . Coronary artery disease    not sure where this dx came from - pt denies  . Family history of melanoma   . Hearing loss   . Hyperlipidemia   . Hypertension   . Internal hemorrhoid   . 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 Obesity   . Pericardial effusion   . Thoracic aortic aneurysm [facility])    a. s/p dissection and repair @ [facility] 2003 with SJM AVR with residual findings followed by CT.  Marland Kitchen Thyroid disease    Past Surgical History:  Procedure Laterality Date  . ABDOMINAL AORTIC ANEURYSM REPAIR    . AORTIC VALVE REPLACEMENT    . impingment     right shoulder  . KNEE ARTHROSCOPY     left  . WISDOM TOOTH EXTRACTION      reports that he quit smoking about  17 years ago. His smoking use included cigarettes. He has a 15.00 pack-year smoking history. He has never used smokeless tobacco. He reports current alcohol use. He reports that he does not use drugs. family history includes Heart disease in his paternal aunt; Liver disease in his sister; Melanoma in his maternal uncle; Other in his maternal grandfather; Stroke in his mother; Stroke (age of onset: 57) in his father. Allergies  Allergen Reactions  . Celecoxib Itching and Other (See Comments)    Joint pain   Current Outpatient Medications on File Prior to Visit  Medication Sig Dispense Refill  . ferrous sulfate 325 (65 FE) MG tablet Take 1 tablet (325 mg total) by mouth daily with breakfast. 30 tablet 3  . losartan (COZAAR) 25 MG tablet TAKE 1 TABLET DAILY 90 tablet 3  . metoprolol (TOPROL-XL) 200 MG 24 hr tablet Take 1 tablet (200 mg total) by mouth daily. Annual appt is due must see provider for future refills 90 tablet 0  . Multiple Vitamin (MULTIVITAMIN) tablet Take 1 tablet by mouth daily.    . rosuvastatin (CRESTOR) 40 MG tablet Take 1 tablet (40 mg total) by mouth daily. Annual appt is due in must see provider for future refills 90 tablet 0  . warfarin (COUMADIN) 5 MG tablet TAKE  ONE TABLET DAILY EXCEPT TAKE ONE AND ONE-HALF TABLETS ON WEDNESDAYS OR TAKE AS DIRECTED BY ANTICOAGULATION CLINIC 120 tablet 3  . fenofibrate 160 MG tablet TAKE 1 TABLET DAILY 90 tablet 3   No current facility-administered medications on file prior to visit.   Review of Systems All otherwise neg per pt     Objective:   Physical Exam BP 110/66 (BP Location: Right Arm, Patient Position: Sitting, Cuff Size: Large)   Temp 99.4 F (37.4 C) (Oral)   Ht 5\' 8"  (1.727 m)   Wt 238 lb 3.2 oz (108 kg)   BMI 36.22 kg/m  VS noted,  Constitutional: Pt appears in NAD HENT: Head: NCAT.  Right Ear: External ear normal.  Left Ear: External ear normal.  Eyes: . Pupils are equal, round, and reactive to light.  Conjunctivae and EOM are normal Nose: without d/c or deformity Neck: Neck supple. Gross normal ROM Cardiovascular: Normal rate and regular rhythm.  with aortic click Pulmonary/Chest: Effort normal and breath sounds without rales or wheezing.  Abd:  Soft, NT, ND, + BS, no organomegaly Neurological: Pt is alert. At baseline orientation, motor grossly intact Skin: Skin is warm. No rashes, other new lesions, no LE edema Psychiatric: Pt behavior is normal without agitation  All otherwise neg per pt Lab Results  Component Value Date   WBC 4.4 02/20/2019   HGB 15.8 02/20/2019   HCT 47.3 02/20/2019   PLT 156 02/20/2019   GLUCOSE 111 (H) 05/26/2019   CHOL 165 09/13/2018   TRIG 163.0 (H) 09/13/2018   HDL 44.40 09/13/2018   LDLDIRECT 114.0 09/23/2014   LDLCALC 88 09/13/2018   ALT 26 02/20/2019   AST 25 02/20/2019   NA 142 05/26/2019   K 4.6 05/26/2019   CL 108 (H) 05/26/2019   CREATININE 1.58 (H) 05/26/2019   BUN 24 05/26/2019   CO2 23 05/26/2019   TSH 1.53 09/13/2018   PSA 1.96 09/13/2018   INR 2.1 09/09/2019   HGBA1C 6.4 09/13/2018        Assessment & Plan:

## 2019-09-26 DIAGNOSIS — Z9889 Other specified postprocedural states: Secondary | ICD-10-CM | POA: Diagnosis not present

## 2019-09-26 DIAGNOSIS — D649 Anemia, unspecified: Secondary | ICD-10-CM | POA: Diagnosis not present

## 2019-09-26 DIAGNOSIS — N1831 Chronic kidney disease, stage 3a: Secondary | ICD-10-CM | POA: Diagnosis not present

## 2019-09-26 DIAGNOSIS — I10 Essential (primary) hypertension: Secondary | ICD-10-CM | POA: Diagnosis not present

## 2019-10-21 ENCOUNTER — Inpatient Hospital Stay: Payer: BC Managed Care – PPO | Attending: Oncology | Admitting: Oncology

## 2019-10-21 ENCOUNTER — Other Ambulatory Visit: Payer: Self-pay

## 2019-10-21 VITALS — BP 130/87 | HR 56 | Temp 97.8°F | Resp 18 | Ht 68.0 in | Wt 228.8 lb

## 2019-10-21 DIAGNOSIS — C467 Kaposi's sarcoma of other sites: Secondary | ICD-10-CM | POA: Diagnosis not present

## 2019-10-21 DIAGNOSIS — C469 Kaposi's sarcoma, unspecified: Secondary | ICD-10-CM | POA: Diagnosis not present

## 2019-10-21 DIAGNOSIS — Z952 Presence of prosthetic heart valve: Secondary | ICD-10-CM | POA: Insufficient documentation

## 2019-10-21 DIAGNOSIS — I429 Cardiomyopathy, unspecified: Secondary | ICD-10-CM | POA: Insufficient documentation

## 2019-10-21 DIAGNOSIS — R918 Other nonspecific abnormal finding of lung field: Secondary | ICD-10-CM | POA: Diagnosis not present

## 2019-10-21 DIAGNOSIS — K4020 Bilateral inguinal hernia, without obstruction or gangrene, not specified as recurrent: Secondary | ICD-10-CM | POA: Insufficient documentation

## 2019-10-21 DIAGNOSIS — Z7901 Long term (current) use of anticoagulants: Secondary | ICD-10-CM | POA: Diagnosis not present

## 2019-10-21 DIAGNOSIS — Z79899 Other long term (current) drug therapy: Secondary | ICD-10-CM | POA: Insufficient documentation

## 2019-10-21 DIAGNOSIS — K760 Fatty (change of) liver, not elsewhere classified: Secondary | ICD-10-CM | POA: Diagnosis not present

## 2019-10-21 NOTE — Progress Notes (Signed)
Hematology and Oncology Follow Up Visit  NINOS WILDEMAN [id number] 06/01/1956 64 y.o. 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    Past therapy:  Doxil chemotherapy given at 30 mg/m every 4 weeks the first cycle given at [facility] in June 2016.   He completed 6 cycles of therapy in November 2016. He developed relapsed disease in September 2019. Doxil 30 mg/m every 4 weeks restarted in September 2019.  He completed 6 months of therapy.  Current therapy: Active surveillance.  Interim History:  [name] is here for return evaluation.  Since the last visit, he reports no major changes in his health.  He denies any new skin rashes or lesions.  He denies any constitutional symptoms including fatigue or tiredness.  His performance status quality of life remain excellent.  He denies any dyspnea on exertion.       Medications: Updated on review. Current Outpatient Medications  Medication Sig Dispense Refill  . fenofibrate 160 MG tablet TAKE 1 TABLET DAILY 90 tablet 3  . ferrous sulfate 325 (65 FE) MG tablet Take 1 tablet (325 mg total) by mouth daily with breakfast. 30 tablet 3  . losartan (COZAAR) 25 MG tablet TAKE 1 TABLET DAILY 90 tablet 3  . metoprolol (TOPROL-XL) 200 MG 24 hr tablet Take 1 tablet (200 mg total) by mouth daily. Annual appt is due must see provider for future refills 90 tablet 0  . Multiple Vitamin (MULTIVITAMIN) tablet Take 1 tablet by mouth daily.    . rosuvastatin (CRESTOR) 40 MG tablet Take 1 tablet (40 mg total) by mouth daily. Annual appt is due in must see provider for future refills 90 tablet 0  . warfarin (COUMADIN) 5 MG tablet TAKE ONE TABLET DAILY EXCEPT TAKE ONE AND ONE-HALF TABLETS ON WEDNESDAYS OR TAKE AS DIRECTED BY ANTICOAGULATION CLINIC 120 tablet 3    No current facility-administered medications for this visit.     Allergies:  Allergies  Allergen Reactions  . Celecoxib Itching and Other (See Comments)    Joint pain       Physical Exam:     Blood pressure 130/87, pulse (!) 56, temperature 97.8 F (36.6 C), temperature source Temporal, resp. rate 18, height 5\' 8"  (1.727 m), weight 228 lb 12.8 oz (103.8 kg), SpO2 100 %.     ECOG: 0    General appearance: Comfortable appearing without any discomfort Head: Normocephalic without any trauma Oropharynx: Mucous membranes are moist and pink without any thrush or ulcers. Eyes: Pupils are equal and round reactive to light. Lymph nodes: No cervical, supraclavicular, inguinal or axillary lymphadenopathy.   Heart:regular rate and rhythm.  S1 and S2 without leg edema. Lung: Clear without any rhonchi or wheezes.  No dullness to percussion. Abdomin: Soft, nontender, nondistended with good bowel sounds.  No hepatosplenomegaly. Musculoskeletal: No joint deformity or effusion.  Full range of motion noted. Neurological: No deficits noted on motor, sensory and deep tendon reflex exam. Skin: Scaly healed lesions noted on his bilateral extremity with the right more than the left.  Unchanged from previous examination. .       Lab Results: Lab Results  Component Value Date   WBC 4.4 09/17/2019   HGB 15.3 09/17/2019   HCT 44.6 09/17/2019   MCV 84.9 09/17/2019   PLT 148.0 (L) 09/17/2019  Chemistry      Component Value Date/Time   NA 139 09/17/2019 1009   NA 142 05/26/2019 1020   NA 143 11/03/2016 0908   K 4.6 09/17/2019 1009   K 5.0 11/03/2016 0908   CL 105 09/17/2019 1009   CO2 29 09/17/2019 1009   CO2 27 11/03/2016 0908   BUN 31 (H) 09/17/2019 1009   BUN 24 05/26/2019 1020   BUN 33.5 (H) 11/03/2016 0908   CREATININE 1.45 09/17/2019 1009   CREATININE 1.48 (H) 02/20/2019 0922   CREATININE 1.5 (H) 11/03/2016 0908      Component Value Date/Time   CALCIUM 9.5  09/17/2019 1009   CALCIUM 9.5 11/03/2016 0908   ALKPHOS 28 (L) 09/17/2019 1009   ALKPHOS 25 (L) 11/03/2016 0908   AST 26 09/17/2019 1009   AST 25 02/20/2019 0922   AST 29 11/03/2016 0908   ALT 32 09/17/2019 1009   ALT 26 02/20/2019 0922   ALT 29 11/03/2016 0908   BILITOT 0.7 09/17/2019 1009   BILITOT 0.6 02/20/2019 0922   BILITOT 0.70 11/03/2016 0908     IMPRESSION: 1. Stable appearance of prosthetic aortic valve and ascending aortic graft. 2. 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 3. Slightly more prominent aneurysmal disease of the distal aortic arch and proximal descending thoracic aorta with slightly increased caliber of the false lumen at the level of the distal arch and proximal descending thoracic aorta. Maximal measured diameter of the proximal descending thoracic aorta is 5.8-6.0 cm compared to 5.6-5.7 cm on the prior study. 4. 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 5. Stable bilateral renal infarcts, left greater than right. 6. Stable 6 mm and 3 mm right lower lobe pulmonary nodules. 7. Stable hepatic steatosis. 8. Bilateral inguinal hernias contain fat.  Impression and Plan:   64 year old man with:  1.  Cutaneous Kaposi sarcoma diagnosed in 2016.  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   He is currently on active surveillance at this time without any evidence of relapse.  The natural course of this disease was reviewed and risks and benefits of restarting Doxil chemotherapy was discussed.  Potential complications associated with Doxil versus active surveillance were reiterated.    Laboratory data obtained on September 17, 2019 were reviewed and overall showed no major abnormalities to suggest disease relapse.  CT scan obtained on June 12, 2020 was also reviewed and did not show any evidence of visceral metastatic disease.  At this time I recommended continued active surveillance with reinstitution therapy only if he has disease progression   2. Cardiomyopathy: No decline in his cardiac function despite the previous exposure to Doxil.   3. Follow-up: In 1 year for repeat follow-up.  30  minutes were dedicated to this visit. The time was spent on reviewing laboratory data, discussing treatment options,  and answering questions regarding future plan.    Zola Button, MD 3/23/20213:28 PM

## 2019-10-23 ENCOUNTER — Ambulatory Visit (INDEPENDENT_AMBULATORY_CARE_PROVIDER_SITE_OTHER): Payer: BC Managed Care – PPO | Admitting: General Practice

## 2019-10-23 ENCOUNTER — Other Ambulatory Visit: Payer: Self-pay

## 2019-10-23 DIAGNOSIS — Z7901 Long term (current) use of anticoagulants: Secondary | ICD-10-CM | POA: Diagnosis not present

## 2019-10-23 DIAGNOSIS — Z952 Presence of prosthetic heart valve: Secondary | ICD-10-CM | POA: Diagnosis not present

## 2019-10-23 LAB — POCT INR: INR: 1.5 — AB (ref 2.0–3.0)

## 2019-10-23 NOTE — Patient Instructions (Addendum)
Pre visit review using our clinic review tool, if applicable. No additional management support is needed unless otherwise documented below in the visit note.  Take 1 1/2 tablets today (3/25) and take 1 tablet tomorrow (3/26).  On Saturday change dosage and take 1 tablet daily except take 1/2 tablet on Tuesdays.  Re-check in 4 weeks.

## 2019-10-23 NOTE — Progress Notes (Signed)
Medical screening examination/treatment/procedure(s) were performed by non-physician practitioner and as supervising physician I was immediately available for consultation/collaboration. I agree with above. Demitri Kucinski, MD   

## 2019-11-20 ENCOUNTER — Other Ambulatory Visit: Payer: Self-pay

## 2019-11-20 ENCOUNTER — Ambulatory Visit (INDEPENDENT_AMBULATORY_CARE_PROVIDER_SITE_OTHER): Payer: BC Managed Care – PPO | Admitting: General Practice

## 2019-11-20 DIAGNOSIS — Z952 Presence of prosthetic heart valve: Secondary | ICD-10-CM

## 2019-11-20 DIAGNOSIS — Z7901 Long term (current) use of anticoagulants: Secondary | ICD-10-CM

## 2019-11-20 DIAGNOSIS — H903 Sensorineural hearing loss, bilateral: Secondary | ICD-10-CM | POA: Diagnosis not present

## 2019-11-20 LAB — POCT INR: INR: 2.4 (ref 2.0–3.0)

## 2019-11-20 NOTE — Patient Instructions (Signed)
Pre visit review using our clinic review tool, if applicable. No additional management support is needed unless otherwise documented below in the visit note.  Continue to  take 1 tablet daily except take 1/2 tablet on Tuesdays.  Re-check in 4 weeks.

## 2019-12-04 ENCOUNTER — Other Ambulatory Visit: Payer: Self-pay | Admitting: Internal Medicine

## 2019-12-04 NOTE — Telephone Encounter (Signed)
Please refill as per office routine med refill policy (all routine meds refilled for 3 mo or monthly per pt preference up to one year from last visit, then month to month grace period for 3 mo, then further med refills will have to be denied)  

## 2019-12-09 DIAGNOSIS — Z45321 Encounter for adjustment and management of cochlear device: Secondary | ICD-10-CM | POA: Diagnosis not present

## 2019-12-09 DIAGNOSIS — H903 Sensorineural hearing loss, bilateral: Secondary | ICD-10-CM | POA: Diagnosis not present

## 2019-12-16 ENCOUNTER — Ambulatory Visit: Payer: BC Managed Care – PPO

## 2019-12-25 ENCOUNTER — Ambulatory Visit (INDEPENDENT_AMBULATORY_CARE_PROVIDER_SITE_OTHER): Payer: BC Managed Care – PPO | Admitting: General Practice

## 2019-12-25 ENCOUNTER — Other Ambulatory Visit: Payer: Self-pay

## 2019-12-25 DIAGNOSIS — Z7901 Long term (current) use of anticoagulants: Secondary | ICD-10-CM | POA: Diagnosis not present

## 2019-12-25 DIAGNOSIS — Z952 Presence of prosthetic heart valve: Secondary | ICD-10-CM

## 2019-12-25 LAB — POCT INR: INR: 2.6 (ref 2.0–3.0)

## 2019-12-25 NOTE — Progress Notes (Signed)
Medical screening examination/treatment/procedure(s) were performed by non-physician practitioner and as supervising physician I was immediately available for consultation/collaboration. I agree with above. Sharah Finnell, MD   

## 2019-12-25 NOTE — Patient Instructions (Addendum)
Pre visit review using our clinic review tool, if applicable. No additional management support is needed unless otherwise documented below in the visit note.  Continue to  take 1 tablet daily except take 1/2 tablet on Tuesdays.  Re-check in 6 weeks.  

## 2020-02-05 ENCOUNTER — Other Ambulatory Visit: Payer: Self-pay

## 2020-02-05 ENCOUNTER — Ambulatory Visit (INDEPENDENT_AMBULATORY_CARE_PROVIDER_SITE_OTHER): Payer: BC Managed Care – PPO | Admitting: General Practice

## 2020-02-05 DIAGNOSIS — Z7901 Long term (current) use of anticoagulants: Secondary | ICD-10-CM

## 2020-02-05 DIAGNOSIS — Z952 Presence of prosthetic heart valve: Secondary | ICD-10-CM

## 2020-02-05 LAB — POCT INR: INR: 2.7 (ref 2.0–3.0)

## 2020-02-05 NOTE — Progress Notes (Signed)
Medical screening examination/treatment/procedure(s) were performed by non-physician practitioner and as supervising physician I was immediately available for consultation/collaboration. I agree with above. Lineth Thielke, MD   

## 2020-02-05 NOTE — Patient Instructions (Addendum)
Pre visit review using our clinic review tool, if applicable. No additional management support is needed unless otherwise documented below in the visit note.  Continue to  take 1 tablet daily except take 1/2 tablet on Tuesdays.  Re-check in 6 weeks.  

## 2020-03-18 ENCOUNTER — Ambulatory Visit: Payer: BC Managed Care – PPO

## 2020-03-23 ENCOUNTER — Other Ambulatory Visit: Payer: Self-pay

## 2020-03-23 ENCOUNTER — Ambulatory Visit (INDEPENDENT_AMBULATORY_CARE_PROVIDER_SITE_OTHER): Payer: BC Managed Care – PPO | Admitting: General Practice

## 2020-03-23 DIAGNOSIS — Z7901 Long term (current) use of anticoagulants: Secondary | ICD-10-CM

## 2020-03-23 LAB — POCT INR: INR: 2.6 (ref 2.0–3.0)

## 2020-03-23 NOTE — Patient Instructions (Addendum)
Pre visit review using our clinic review tool, if applicable. No additional management support is needed unless otherwise documented below in the visit note.  Continue to  take 1 tablet daily except take 1/2 tablet on Tuesdays.  Re-check in 6 weeks.

## 2020-05-04 ENCOUNTER — Ambulatory Visit (INDEPENDENT_AMBULATORY_CARE_PROVIDER_SITE_OTHER): Payer: BC Managed Care – PPO | Admitting: General Practice

## 2020-05-04 ENCOUNTER — Other Ambulatory Visit: Payer: Self-pay

## 2020-05-04 DIAGNOSIS — Z7901 Long term (current) use of anticoagulants: Secondary | ICD-10-CM

## 2020-05-04 DIAGNOSIS — Z952 Presence of prosthetic heart valve: Secondary | ICD-10-CM

## 2020-05-04 LAB — POCT INR: INR: 3.6 — AB (ref 2.0–3.0)

## 2020-05-04 NOTE — Patient Instructions (Signed)
Pre visit review using our clinic review tool, if applicable. No additional management support is needed unless otherwise documented below in the visit note.  Skip dosage today and take 1/2 tablet tomorrow.  On Thursday continue to  take 1 tablet daily except take 1/2 tablet on Tuesdays.  Re-check in 4 weeks.

## 2020-05-04 NOTE — Progress Notes (Signed)
Medical screening examination/treatment/procedure(s) were performed by non-physician practitioner and as supervising physician I was immediately available for consultation/collaboration. I agree with above. Bonetta Mostek, MD   

## 2020-05-05 DIAGNOSIS — Z03818 Encounter for observation for suspected exposure to other biological agents ruled out: Secondary | ICD-10-CM | POA: Diagnosis not present

## 2020-05-26 DIAGNOSIS — Z03818 Encounter for observation for suspected exposure to other biological agents ruled out: Secondary | ICD-10-CM | POA: Diagnosis not present

## 2020-06-01 ENCOUNTER — Other Ambulatory Visit: Payer: Self-pay

## 2020-06-01 ENCOUNTER — Ambulatory Visit (INDEPENDENT_AMBULATORY_CARE_PROVIDER_SITE_OTHER): Payer: BC Managed Care – PPO | Admitting: General Practice

## 2020-06-01 DIAGNOSIS — Z952 Presence of prosthetic heart valve: Secondary | ICD-10-CM

## 2020-06-01 DIAGNOSIS — Z7901 Long term (current) use of anticoagulants: Secondary | ICD-10-CM | POA: Diagnosis not present

## 2020-06-01 LAB — POCT INR: INR: 3.1 — AB (ref 2.0–3.0)

## 2020-06-01 NOTE — Progress Notes (Signed)
Medical screening examination/treatment/procedure(s) were performed by non-physician practitioner and as supervising physician I was immediately available for consultation/collaboration. I agree with above. Juliauna Stueve, MD   

## 2020-06-01 NOTE — Patient Instructions (Signed)
Pre visit review using our clinic review tool, if applicable. No additional management support is needed unless otherwise documented below in the visit note.  Change dosage and take  1 tablet daily except take 1/2 tablet on Tuesdays and Fridays.  Re-check in 4 weeks.

## 2020-06-29 ENCOUNTER — Other Ambulatory Visit: Payer: Self-pay

## 2020-06-29 ENCOUNTER — Ambulatory Visit (INDEPENDENT_AMBULATORY_CARE_PROVIDER_SITE_OTHER): Payer: BC Managed Care – PPO | Admitting: General Practice

## 2020-06-29 DIAGNOSIS — Z7901 Long term (current) use of anticoagulants: Secondary | ICD-10-CM | POA: Diagnosis not present

## 2020-06-29 DIAGNOSIS — Z952 Presence of prosthetic heart valve: Secondary | ICD-10-CM

## 2020-06-29 DIAGNOSIS — Z03818 Encounter for observation for suspected exposure to other biological agents ruled out: Secondary | ICD-10-CM | POA: Diagnosis not present

## 2020-06-29 LAB — POCT INR: INR: 2.2 (ref 2.0–3.0)

## 2020-06-29 NOTE — Progress Notes (Signed)
Medical screening examination/treatment/procedure(s) were performed by non-physician practitioner and as supervising physician I was immediately available for consultation/collaboration. I agree with above. Shakena Callari, MD   

## 2020-06-29 NOTE — Patient Instructions (Addendum)
Pre visit review using our clinic review tool, if applicable. No additional management support is needed unless otherwise documented below in the visit note.  Continue to take 1 tablet daily except take 1/2 tablet on Tuesdays and Fridays.  Re-check in 4 weeks.  

## 2020-08-02 DIAGNOSIS — Z03818 Encounter for observation for suspected exposure to other biological agents ruled out: Secondary | ICD-10-CM | POA: Diagnosis not present

## 2020-08-10 ENCOUNTER — Other Ambulatory Visit: Payer: Self-pay

## 2020-08-10 ENCOUNTER — Ambulatory Visit (INDEPENDENT_AMBULATORY_CARE_PROVIDER_SITE_OTHER): Payer: BC Managed Care – PPO | Admitting: General Practice

## 2020-08-10 DIAGNOSIS — Z7901 Long term (current) use of anticoagulants: Secondary | ICD-10-CM

## 2020-08-10 DIAGNOSIS — Z952 Presence of prosthetic heart valve: Secondary | ICD-10-CM

## 2020-08-10 LAB — POCT INR: INR: 1.8 — AB (ref 2.0–3.0)

## 2020-08-10 NOTE — Patient Instructions (Addendum)
Pre visit review using our clinic review tool, if applicable. No additional management support is needed unless otherwise documented below in the visit note.  Take 1 tablet today and then continue to take  1 tablet daily except take 1/2 tablet on Tuesdays and Fridays.  Re-check in 4 weeks.

## 2020-08-10 NOTE — Progress Notes (Signed)
Medical screening examination/treatment/procedure(s) were performed by non-physician practitioner and as supervising physician I was immediately available for consultation/collaboration. I agree with above. Aldean Pipe, MD   

## 2020-08-13 ENCOUNTER — Other Ambulatory Visit: Payer: Self-pay | Admitting: Internal Medicine

## 2020-08-13 NOTE — Telephone Encounter (Signed)
Please refill as per office routine med refill policy (all routine meds refilled for 3 mo or monthly per pt preference up to one year from last visit, then month to month grace period for 3 mo, then further med refills will have to be denied)  

## 2020-09-03 ENCOUNTER — Inpatient Hospital Stay: Payer: BC Managed Care – PPO | Attending: Oncology | Admitting: Oncology

## 2020-09-03 ENCOUNTER — Other Ambulatory Visit: Payer: Self-pay

## 2020-09-03 VITALS — BP 134/67 | HR 63 | Temp 96.6°F | Resp 18 | Ht 68.0 in | Wt 227.4 lb

## 2020-09-03 DIAGNOSIS — Z79899 Other long term (current) drug therapy: Secondary | ICD-10-CM | POA: Diagnosis not present

## 2020-09-03 DIAGNOSIS — E785 Hyperlipidemia, unspecified: Secondary | ICD-10-CM | POA: Diagnosis not present

## 2020-09-03 DIAGNOSIS — C469 Kaposi's sarcoma, unspecified: Secondary | ICD-10-CM | POA: Diagnosis not present

## 2020-09-03 DIAGNOSIS — G893 Neoplasm related pain (acute) (chronic): Secondary | ICD-10-CM | POA: Diagnosis not present

## 2020-09-03 DIAGNOSIS — Z87891 Personal history of nicotine dependence: Secondary | ICD-10-CM | POA: Insufficient documentation

## 2020-09-03 DIAGNOSIS — Z5111 Encounter for antineoplastic chemotherapy: Secondary | ICD-10-CM | POA: Insufficient documentation

## 2020-09-03 DIAGNOSIS — Z7901 Long term (current) use of anticoagulants: Secondary | ICD-10-CM | POA: Diagnosis not present

## 2020-09-03 DIAGNOSIS — L03115 Cellulitis of right lower limb: Secondary | ICD-10-CM | POA: Insufficient documentation

## 2020-09-03 DIAGNOSIS — I10 Essential (primary) hypertension: Secondary | ICD-10-CM | POA: Insufficient documentation

## 2020-09-03 DIAGNOSIS — N182 Chronic kidney disease, stage 2 (mild): Secondary | ICD-10-CM | POA: Insufficient documentation

## 2020-09-03 DIAGNOSIS — I2510 Atherosclerotic heart disease of native coronary artery without angina pectoris: Secondary | ICD-10-CM | POA: Insufficient documentation

## 2020-09-03 DIAGNOSIS — E079 Disorder of thyroid, unspecified: Secondary | ICD-10-CM | POA: Insufficient documentation

## 2020-09-03 MED ORDER — LIDOCAINE-PRILOCAINE 2.5-2.5 % EX CREA: 1.0000 "application " | TOPICAL_CREAM | CUTANEOUS | 0 refills | Status: AC | PRN

## 2020-09-03 NOTE — Progress Notes (Signed)
Hematology and Oncology Follow Up Visit  Austin [name] [id number] 1955/08/16 65 y.o. 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     Past therapy:  Doxil chemotherapy given at 30 mg/m every 4 weeks the first cycle given at [facility] in June 2016.   He completed 6 cycles of therapy in November 2016. He developed relapsed disease in September 2019. Doxil 30 mg/m every 4 weeks restarted in September 2019.  He completed 6 months of therapy.  Current therapy: Active surveillance.  Interim History:  [name] presents today for a follow-up visit.  Since the last visit, he has reported increase erythema and drainage associated with lesion on his left leg.  This is the site of his treated Kaposi's sarcoma lesion.  He denies any constitutional symptoms of weakness, fatigue or diarrhea.  He denies respiratory complaints including hemoptysis or hematemesis.  He denies any fevers chills or sweats.       Medications: Unchanged on review. Current Outpatient Medications  Medication Sig Dispense Refill  . fenofibrate 160 MG tablet TAKE 1 TABLET DAILY 90 tablet 3  . ferrous sulfate 325 (65 FE) MG tablet Take 1 tablet (325 mg total) by mouth daily with breakfast. 30 tablet 3  . losartan (COZAAR) 25 MG tablet TAKE 1 TABLET DAILY 90 tablet 3  . metoprolol (TOPROL-XL) 200 MG 24 hr tablet TAKE 1 TABLET DAILY (ANNUAL APPOINTMENT IS DUE, MUST SEE PROVIDER FOR FUTURE REFILLS) 90 tablet 3  . Multiple Vitamin (MULTIVITAMIN) tablet Take 1 tablet by mouth daily.    . rosuvastatin (CRESTOR) 40 MG tablet TAKE 1 TABLET DAILY (ANNUAL APPOINTMENT IS DUE IN MUST SEE PROVIDER FOR FUTURE REFILLS) 90 tablet 3  . warfarin (COUMADIN) 5 MG tablet TAKE ONE TABLET DAILY EXCEPT TAKE ONE AND ONE-HALF TABLETS ON WEDNESDAYS OR TAKE AS DIRECTED BY  ANTICOAGULATION CLINIC 120 tablet 3   No current facility-administered medications for this visit.     Allergies:  Allergies  Allergen Reactions  . Celecoxib Itching and Other (See Comments)    Joint pain       Physical Exam:     Blood pressure 134/67, pulse 63, temperature (!) 96.6 F (35.9 C), temperature source Tympanic, resp. rate 18, height 5\' 8"  (1.727 m), weight 227 lb 6.4 oz (103.1 kg), SpO2 100 %.      ECOG: 0    General appearance: Alert, awake without any distress. Head: Atraumatic without abnormalities Oropharynx: Without any thrush or ulcers. Eyes: No scleral icterus. Lymph nodes: No lymphadenopathy noted in the cervical, supraclavicular, or axillary nodes Heart:regular rate and rhythm, without any murmurs or gallops.   Lung: Clear to auscultation without any rhonchi, wheezes or dullness to percussion. Abdomin: Soft, nontender without any shifting dullness or ascites. Musculoskeletal: No clubbing or cyanosis. Neurological: No motor or sensory deficits. Skin: Raised black lesion on his left lower extremity associated with erythema and drainage.       Lab Results: Lab Results  Component Value Date   WBC 4.4 09/17/2019   HGB 15.3 09/17/2019   HCT 44.6 09/17/2019   MCV 84.9 09/17/2019   PLT 148.0 (L) 09/17/2019     Chemistry      Component Value Date/Time   NA 139 09/17/2019 1009   NA 142 05/26/2019 1020   NA 143 11/03/2016 0908   K 4.6 09/17/2019 1009  K 5.0 11/03/2016 0908   CL 105 09/17/2019 1009   CO2 29 09/17/2019 1009   CO2 27 11/03/2016 0908   BUN 31 (H) 09/17/2019 1009   BUN 24 05/26/2019 1020   BUN 33.5 (H) 11/03/2016 0908   CREATININE 1.45 09/17/2019 1009   CREATININE 1.48 (H) 02/20/2019 0922   CREATININE 1.5 (H) 11/03/2016 0908      Component Value Date/Time   CALCIUM 9.5 09/17/2019 1009   CALCIUM 9.5 11/03/2016 0908   ALKPHOS 28 (L) 09/17/2019 1009   ALKPHOS 25 (L) 11/03/2016 0908   AST 26 09/17/2019 1009   AST 25  02/20/2019 0922   AST 29 11/03/2016 0908   ALT 32 09/17/2019 1009   ALT 26 02/20/2019 0922   ALT 29 11/03/2016 0908   BILITOT 0.7 09/17/2019 1009   BILITOT 0.6 02/20/2019 0922   BILITOT 0.70 11/03/2016 0908      Impression and Plan:   65 year old man with:  1.  Extensive cutaneous Kaposi sarcoma involving the upper and lower extremities diagnosed in 2016.  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   He is status post Doxil therapy outlined above currently on active surveillance.  Risks and benefits of restarting treatment were reviewed at this time.  Alternative treatment options would be local therapy predominantly radiation were discussed.  Complication associated with all these approaches were reviewed at this time.  After discussion today, he has presented with relapse in his left lower extremity that requires immediate restart of Doxil chemotherapy.  I do not think radiation has a role at this time.  Risks associated with this treatment including nausea, vomiting, myelosuppression and cardiomyopathy.  Benefit would be improvement in his lesions and more expedited fashion.  After discussion he is agreeable to proceed and will receive 20 mg per metered square every 4 weeks.   2. Cardiomyopathy: Last echocardiogram was in 2019 and showed preserved EF.  We will update his echo in the near future.   3.  Antiemetics: Prescription for Compazine is available to him.   4.  IV access: Risks and benefits of Port-A-Cath insertion were discussed at this time.  Complications that include bleeding, thrombosis and infection were reiterated.  We will arrange for that to be implemented in the near future.   5. Follow-up: He will return in the near future for the start of Doxil.  30  minutes were spent on this encounter.  The time was dedicated to reviewing his disease status, treatment options and complications related to therapy.   Zola Button, MD 2/4/20229:54 AM

## 2020-09-07 ENCOUNTER — Other Ambulatory Visit: Payer: Self-pay | Admitting: Oncology

## 2020-09-07 ENCOUNTER — Other Ambulatory Visit: Payer: Self-pay

## 2020-09-07 ENCOUNTER — Ambulatory Visit: Payer: BC Managed Care – PPO | Admitting: General Practice

## 2020-09-07 ENCOUNTER — Telehealth: Payer: Self-pay | Admitting: Oncology

## 2020-09-07 DIAGNOSIS — C469 Kaposi's sarcoma, unspecified: Secondary | ICD-10-CM

## 2020-09-07 DIAGNOSIS — Z952 Presence of prosthetic heart valve: Secondary | ICD-10-CM

## 2020-09-07 DIAGNOSIS — Z7901 Long term (current) use of anticoagulants: Secondary | ICD-10-CM | POA: Diagnosis not present

## 2020-09-07 LAB — POCT INR: INR: 2.3 (ref 2.0–3.0)

## 2020-09-07 NOTE — Progress Notes (Signed)
Medical screening examination/treatment/procedure(s) were performed by non-physician practitioner and as supervising physician I was immediately available for consultation/collaboration. I agree with above. Letesha Klecker, MD   

## 2020-09-07 NOTE — Patient Instructions (Addendum)
Pre visit review using our clinic review tool, if applicable. No additional management support is needed unless otherwise documented below in the visit note.  Continue to take 1 tablet daily except take 1/2 tablet on Tuesdays and Fridays.  Re-check in 4 weeks.

## 2020-09-07 NOTE — Telephone Encounter (Signed)
Scheduled per 02/04 los, patient has been called and voicemail was left.

## 2020-09-09 ENCOUNTER — Telehealth: Payer: Self-pay | Admitting: Cardiovascular Disease

## 2020-09-09 NOTE — Telephone Encounter (Signed)
  If Home Health RN is calling please get Coumadin Nurse on the phone STAT  1.  Are you calling in regards to an appointment? no  2.  Are you calling for a refill ? no  3.  Are you having bleeding issues? no  4.  Do you need clearance to hold Coumadin? Yes   Pt c/o medication issue:  1. Name of Medication: warfarin (COUMADIN) 5 MG tablet  2. How are you currently taking this medication (dosage and times per day)? As directed   3. Are you having a reaction (difficulty breathing--STAT)?   4. What is your medication issue? Patient is having a port put in for Chemo 09/17/20 and wanted to know if he should hold his coumadin prior to having the port put in. Please advise   Please route to the Coumadin Clinic Pool

## 2020-09-09 NOTE — Telephone Encounter (Signed)
Noted  

## 2020-09-13 ENCOUNTER — Other Ambulatory Visit: Payer: Self-pay | Admitting: General Practice

## 2020-09-13 ENCOUNTER — Telehealth: Payer: Self-pay | Admitting: General Practice

## 2020-09-13 ENCOUNTER — Inpatient Hospital Stay: Payer: BC Managed Care – PPO

## 2020-09-13 ENCOUNTER — Other Ambulatory Visit: Payer: Self-pay

## 2020-09-13 VITALS — BP 123/86 | HR 80 | Temp 98.9°F | Resp 18 | Wt 225.0 lb

## 2020-09-13 DIAGNOSIS — E785 Hyperlipidemia, unspecified: Secondary | ICD-10-CM | POA: Diagnosis not present

## 2020-09-13 DIAGNOSIS — L03115 Cellulitis of right lower limb: Secondary | ICD-10-CM | POA: Diagnosis not present

## 2020-09-13 DIAGNOSIS — E079 Disorder of thyroid, unspecified: Secondary | ICD-10-CM | POA: Diagnosis not present

## 2020-09-13 DIAGNOSIS — I10 Essential (primary) hypertension: Secondary | ICD-10-CM | POA: Diagnosis not present

## 2020-09-13 DIAGNOSIS — Z5111 Encounter for antineoplastic chemotherapy: Secondary | ICD-10-CM | POA: Diagnosis not present

## 2020-09-13 DIAGNOSIS — Z79899 Other long term (current) drug therapy: Secondary | ICD-10-CM | POA: Diagnosis not present

## 2020-09-13 DIAGNOSIS — I2510 Atherosclerotic heart disease of native coronary artery without angina pectoris: Secondary | ICD-10-CM | POA: Diagnosis not present

## 2020-09-13 DIAGNOSIS — Z87891 Personal history of nicotine dependence: Secondary | ICD-10-CM | POA: Diagnosis not present

## 2020-09-13 DIAGNOSIS — C469 Kaposi's sarcoma, unspecified: Secondary | ICD-10-CM

## 2020-09-13 DIAGNOSIS — Z7901 Long term (current) use of anticoagulants: Secondary | ICD-10-CM

## 2020-09-13 DIAGNOSIS — G893 Neoplasm related pain (acute) (chronic): Secondary | ICD-10-CM | POA: Diagnosis not present

## 2020-09-13 DIAGNOSIS — N182 Chronic kidney disease, stage 2 (mild): Secondary | ICD-10-CM | POA: Diagnosis not present

## 2020-09-13 LAB — CBC WITH DIFFERENTIAL (CANCER CENTER ONLY)
Abs Immature Granulocytes: 0.03 10*3/uL (ref 0.00–0.07)
Basophils Absolute: 0.1 10*3/uL (ref 0.0–0.1)
Basophils Relative: 1 %
Eosinophils Absolute: 0.5 10*3/uL (ref 0.0–0.5)
Eosinophils Relative: 5 %
HCT: 49.2 % (ref 39.0–52.0)
Hemoglobin: 16.6 g/dL (ref 13.0–17.0)
Immature Granulocytes: 0 %
Lymphocytes Relative: 17 %
Lymphs Abs: 1.6 10*3/uL (ref 0.7–4.0)
MCH: 28.2 pg (ref 26.0–34.0)
MCHC: 33.7 g/dL (ref 30.0–36.0)
MCV: 83.7 fL (ref 80.0–100.0)
Monocytes Absolute: 0.8 10*3/uL (ref 0.1–1.0)
Monocytes Relative: 9 %
Neutro Abs: 6.4 10*3/uL (ref 1.7–7.7)
Neutrophils Relative %: 68 %
Platelet Count: 253 10*3/uL (ref 150–400)
RBC: 5.88 MIL/uL — ABNORMAL HIGH (ref 4.22–5.81)
RDW: 12.6 % (ref 11.5–15.5)
WBC Count: 9.4 10*3/uL (ref 4.0–10.5)
nRBC: 0 % (ref 0.0–0.2)

## 2020-09-13 LAB — CMP (CANCER CENTER ONLY)
ALT: 23 U/L (ref 0–44)
AST: 27 U/L (ref 15–41)
Albumin: 3.4 g/dL — ABNORMAL LOW (ref 3.5–5.0)
Alkaline Phosphatase: 31 U/L — ABNORMAL LOW (ref 38–126)
Anion gap: 7 (ref 5–15)
BUN: 37 mg/dL — ABNORMAL HIGH (ref 8–23)
CO2: 24 mmol/L (ref 22–32)
Calcium: 8.8 mg/dL — ABNORMAL LOW (ref 8.9–10.3)
Chloride: 106 mmol/L (ref 98–111)
Creatinine: 2.15 mg/dL — ABNORMAL HIGH (ref 0.61–1.24)
GFR, Estimated: 33 mL/min — ABNORMAL LOW (ref >60)
Glucose, Bld: 227 mg/dL — ABNORMAL HIGH (ref 70–99)
Potassium: 4.7 mmol/L (ref 3.5–5.1)
Sodium: 137 mmol/L (ref 135–145)
Total Bilirubin: 0.5 mg/dL (ref 0.3–1.2)
Total Protein: 6.2 g/dL — ABNORMAL LOW (ref 6.5–8.1)

## 2020-09-13 MED ORDER — ENOXAPARIN SODIUM 100 MG/ML ~~LOC~~ SOLN: 100.0000 mg | SUBCUTANEOUS | 0 refills | Status: DC

## 2020-09-13 MED ORDER — SODIUM CHLORIDE 0.9 % IV SOLN
10.0000 mg | Freq: Once | INTRAVENOUS | Status: AC
  Administered 2020-09-13: 10 mg via INTRAVENOUS
  Filled 2020-09-13: qty 1

## 2020-09-13 MED ORDER — DEXTROSE 5 % IV SOLN
Freq: Once | INTRAVENOUS | Status: AC
  Filled 2020-09-13: qty 250

## 2020-09-13 MED ORDER — DEXAMETHASONE SODIUM PHOSPHATE 10 MG/ML IJ SOLN: 10.0000 mg | Freq: Once | INTRAMUSCULAR | Status: DC

## 2020-09-13 MED ORDER — DOXORUBICIN HCL LIPOSOMAL CHEMO INJECTION 2 MG/ML
27.0000 mg/m2 | Freq: Once | INTRAVENOUS | Status: AC
  Administered 2020-09-13: 60 mg via INTRAVENOUS
  Filled 2020-09-13: qty 30

## 2020-09-13 NOTE — Patient Instructions (Signed)
[facility] Discharge Instructions for Patients Receiving Chemotherapy  Today you received the following chemotherapy agents: doxil.  To help prevent nausea and vomiting after your treatment, we encourage you to take your nausea medication as directed.   If you develop nausea and vomiting that is not controlled by your nausea medication, call the clinic.   BELOW ARE SYMPTOMS THAT SHOULD BE REPORTED IMMEDIATELY:  *FEVER GREATER THAN 100.5 F  *CHILLS WITH OR WITHOUT FEVER  NAUSEA AND VOMITING THAT IS NOT CONTROLLED WITH YOUR NAUSEA MEDICATION  *UNUSUAL SHORTNESS OF BREATH  *UNUSUAL BRUISING OR BLEEDING  TENDERNESS IN MOUTH AND THROAT WITH OR WITHOUT PRESENCE OF ULCERS  *URINARY PROBLEMS  *BOWEL PROBLEMS  UNUSUAL RASH Items with * indicate a potential emergency and should be followed up as soon as possible.  Feel free to call the clinic should you have any questions or concerns. The clinic phone number is [phone].  Please show the [facility] at check-in to the Emergency Department and triage nurse.

## 2020-09-13 NOTE — Telephone Encounter (Signed)
-----   Message from [name], MD sent at 09/09/2020  5:15 PM EST ----- Regarding: RE: Lovenox bridge Yes, that would be correct, I agree - thanks ----- Message ----- From: Warden Fillers, RN Sent: 09/09/2020   3:23 PM EST To: [name], MD Subject: Lovenox bridge                                 Dr. Jenny Reichmann ,  Patient is having a port inserted for chemotherapy on 2/18.  I haven't heard from oncology and haven't seen a form for clearance.  I'm thinking pt will need to hold warfarin for 5 days and will need a Lovenox bridge.  What are your thoughts?  Please advise.  Thanks, Villa Herb, RN

## 2020-09-13 NOTE — Telephone Encounter (Signed)
Instructions for warfarin and Lovenox pre and post procedure on 2/18.  2/14 - Nothing today (No warfarin or Lovenox) 2/15 - Lovenox in the AM and PM (12 hours apart) 2/16 - Lovenox in the AM and PM 2/17 - LOVENOX IN THE AM ONLY! 2/18 - PROCEDURE DAY - DO NOT TAKE LOVENOX TODAY! 2/19 - Lovenox in the AM and PM AND 1 1/2 tablets of warfarin 2/20 - Lovenox in the AM and PM AND 1 1/2 tablets of warfarin 2/21 - Lovenox in the AM and PM AND 1 1/2 tablets of warfarin 2/22 - Lovenox in the AM and PM AND 1 tablet of warfarin 2/23 - Stop Lovenox and resume previous dosage of warfarin 2/24 - Check INR  Instructions given to patient.  Patient verbalized understanding.

## 2020-09-13 NOTE — Progress Notes (Signed)
Per Dr Alen Blew, ok to treat with creatinine 2.15

## 2020-09-16 ENCOUNTER — Other Ambulatory Visit: Payer: Self-pay | Admitting: Student

## 2020-09-17 ENCOUNTER — Ambulatory Visit (HOSPITAL_COMMUNITY)
Admission: RE | Admit: 2020-09-17 | Discharge: 2020-09-17 | Disposition: A | Discharge status: Home / Self Care | Payer: BC Managed Care – PPO | Source: Ambulatory Visit | Attending: Oncology | Admitting: Oncology

## 2020-09-17 ENCOUNTER — Other Ambulatory Visit: Payer: Self-pay | Admitting: Oncology

## 2020-09-17 ENCOUNTER — Other Ambulatory Visit: Payer: Self-pay

## 2020-09-17 ENCOUNTER — Encounter (HOSPITAL_COMMUNITY): Payer: Self-pay

## 2020-09-17 DIAGNOSIS — I2510 Atherosclerotic heart disease of native coronary artery without angina pectoris: Secondary | ICD-10-CM | POA: Diagnosis not present

## 2020-09-17 DIAGNOSIS — Z7901 Long term (current) use of anticoagulants: Secondary | ICD-10-CM | POA: Insufficient documentation

## 2020-09-17 DIAGNOSIS — Z79899 Other long term (current) drug therapy: Secondary | ICD-10-CM | POA: Diagnosis not present

## 2020-09-17 DIAGNOSIS — E785 Hyperlipidemia, unspecified: Secondary | ICD-10-CM | POA: Insufficient documentation

## 2020-09-17 DIAGNOSIS — C469 Kaposi's sarcoma, unspecified: Secondary | ICD-10-CM | POA: Diagnosis not present

## 2020-09-17 DIAGNOSIS — E079 Disorder of thyroid, unspecified: Secondary | ICD-10-CM | POA: Diagnosis not present

## 2020-09-17 DIAGNOSIS — D631 Anemia in chronic kidney disease: Secondary | ICD-10-CM | POA: Insufficient documentation

## 2020-09-17 DIAGNOSIS — I129 Hypertensive chronic kidney disease with stage 1 through stage 4 chronic kidney disease, or unspecified chronic kidney disease: Secondary | ICD-10-CM | POA: Insufficient documentation

## 2020-09-17 DIAGNOSIS — Z87891 Personal history of nicotine dependence: Secondary | ICD-10-CM | POA: Diagnosis not present

## 2020-09-17 DIAGNOSIS — I712 Thoracic aortic aneurysm, without rupture: Secondary | ICD-10-CM | POA: Insufficient documentation

## 2020-09-17 DIAGNOSIS — Z452 Encounter for adjustment and management of vascular access device: Secondary | ICD-10-CM | POA: Diagnosis not present

## 2020-09-17 DIAGNOSIS — N189 Chronic kidney disease, unspecified: Secondary | ICD-10-CM | POA: Diagnosis not present

## 2020-09-17 HISTORY — PX: IR IMAGING GUIDED PORT INSERTION: IMG5740

## 2020-09-17 MED ORDER — HEPARIN SOD (PORK) LOCK FLUSH 100 UNIT/ML IV SOLN
INTRAVENOUS | Status: AC | PRN
  Administered 2020-09-17: 500 [IU] via INTRAVENOUS

## 2020-09-17 MED ORDER — CEFAZOLIN SODIUM-DEXTROSE 2-4 GM/100ML-% IV SOLN
INTRAVENOUS | Status: AC
  Filled 2020-09-17: qty 100

## 2020-09-17 MED ORDER — LIDOCAINE HCL 1 % IJ SOLN
INTRAMUSCULAR | Status: AC
  Filled 2020-09-17: qty 20

## 2020-09-17 MED ORDER — LIDOCAINE-EPINEPHRINE 1 %-1:100000 IJ SOLN
INTRAMUSCULAR | Status: AC
  Filled 2020-09-17: qty 1

## 2020-09-17 MED ORDER — FENTANYL CITRATE (PF) 100 MCG/2ML IJ SOLN
INTRAMUSCULAR | Status: AC
  Filled 2020-09-17: qty 2

## 2020-09-17 MED ORDER — LIDOCAINE-EPINEPHRINE 1 %-1:100000 IJ SOLN
INTRAMUSCULAR | Status: AC | PRN
  Administered 2020-09-17 (×2): 10 mL via INTRADERMAL

## 2020-09-17 MED ORDER — MIDAZOLAM HCL 2 MG/2ML IJ SOLN
INTRAMUSCULAR | Status: AC
  Filled 2020-09-17: qty 4

## 2020-09-17 MED ORDER — CEFAZOLIN SODIUM-DEXTROSE 2-4 GM/100ML-% IV SOLN
2.0000 g | Freq: Once | INTRAVENOUS | Status: AC
  Administered 2020-09-17: 2 g via INTRAVENOUS

## 2020-09-17 MED ORDER — FENTANYL CITRATE (PF) 100 MCG/2ML IJ SOLN
INTRAMUSCULAR | Status: AC | PRN
  Administered 2020-09-17: 50 ug via INTRAVENOUS

## 2020-09-17 MED ORDER — SODIUM CHLORIDE 0.9 % IV SOLN: INTRAVENOUS | Status: DC

## 2020-09-17 MED ORDER — MIDAZOLAM HCL 2 MG/2ML IJ SOLN
INTRAMUSCULAR | Status: AC | PRN
  Administered 2020-09-17 (×4): 1 mg via INTRAVENOUS

## 2020-09-17 MED ORDER — HEPARIN SOD (PORK) LOCK FLUSH 100 UNIT/ML IV SOLN
INTRAVENOUS | Status: AC
  Filled 2020-09-17: qty 5

## 2020-09-17 NOTE — Discharge Instructions (Signed)
Please call Interventional Radiology clinic [phone] with any questions or concerns.  You may remove your dressing and shower tomorrow.  DO NOT use EMLA cream for 2 weeks after port placement as this cream will remove surgical glue on your incision.   Implanted Port Insertion, Care After This sheet gives you information about how to care for yourself after your procedure. Your health care provider may also give you more specific instructions. If you have problems or questions, contact your health care provider. What can I expect after the procedure? After the procedure, it is common to have:  Discomfort at the port insertion site.  Bruising on the skin over the port. This should improve over 3-4 days. Follow these instructions at home: Port care  After your port is placed, you will get a manufacturer's information card. The card has information about your port. Keep this card with you at all times.  Take care of the port as told by your health care provider. Ask your health care provider if you or a family member can get training for taking care of the port at home. A home health care nurse may also take care of the port.  Make sure to remember what type of port you have. Incision care  Follow instructions from your health care provider about how to take care of your port insertion site. Make sure you: ? Wash your hands with soap and water before and after you change your bandage (dressing). If soap and water are not available, use hand sanitizer. ? Change your dressing as told by your health care provider. ? Leave stitches (sutures), skin glue, or adhesive strips in place. These skin closures may need to stay in place for 2 weeks or longer. If adhesive strip edges start to loosen and curl up, you may trim the loose edges. Do not remove adhesive strips completely unless your health care provider tells you to do that.  Check your port insertion site every day for signs of infection.  Check for: ? Redness, swelling, or pain. ? Fluid or blood. ? Warmth. ? Pus or a bad smell.      Activity  Return to your normal activities as told by your health care provider. Ask your health care provider what activities are safe for you.  Do not lift anything that is heavier than 10 lb (4.5 kg), or the limit that you are told, until your health care provider says that it is safe. General instructions  Take over-the-counter and prescription medicines only as told by your health care provider.  Do not take baths, swim, or use a hot tub until your health care provider approves. Ask your health care provider if you may take showers. You may only be allowed to take sponge baths.  Do not drive for 24 hours if you were given a sedative during your procedure.  Wear a medical alert bracelet in case of an emergency. This will tell any health care providers that you have a port.  Keep all follow-up visits as told by your health care provider. This is important. Contact a health care provider if:  You cannot flush your port with saline as directed, or you cannot draw blood from the port.  You have a fever or chills.  You have redness, swelling, or pain around your port insertion site.  You have fluid or blood coming from your port insertion site.  Your port insertion site feels warm to the touch.  You have pus or a   bad smell coming from the port insertion site. Get help right away if:  You have chest pain or shortness of breath.  You have bleeding from your port that you cannot control. Summary  Take care of the port as told by your health care provider. Keep the manufacturer's information card with you at all times.  Change your dressing as told by your health care provider.  Contact a health care provider if you have a fever or chills or if you have redness, swelling, or pain around your port insertion site.  Keep all follow-up visits as told by your health care  provider. This information is not intended to replace advice given to you by your health care provider. Make sure you discuss any questions you have with your health care provider. Document Revised: 02/12/2018 Document Reviewed: 02/12/2018 Elsevier Patient Education  2021 Elsevier Inc.    Moderate Conscious Sedation, Adult, Care After This sheet gives you information about how to care for yourself after your procedure. Your health care provider may also give you more specific instructions. If you have problems or questions, contact your health care provider. What can I expect after the procedure? After the procedure, it is common to have:  Sleepiness for several hours.  Impaired judgment for several hours.  Difficulty with balance.  Vomiting if you eat too soon. Follow these instructions at home: For the time period you were told by your health care provider:  Rest.  Do not participate in activities where you could fall or become injured.  Do not drive or use machinery.  Do not drink alcohol.  Do not take sleeping pills or medicines that cause drowsiness.  Do not make important decisions or sign legal documents.  Do not take care of children on your own.      Eating and drinking  Follow the diet recommended by your health care provider.  Drink enough fluid to keep your urine pale yellow.  If you vomit: ? Drink water, juice, or soup when you can drink without vomiting. ? Make sure you have little or no nausea before eating solid foods.   General instructions  Take over-the-counter and prescription medicines only as told by your health care provider.  Have a responsible adult stay with you for the time you are told. It is important to have someone help care for you until you are awake and alert.  Do not smoke.  Keep all follow-up visits as told by your health care provider. This is important. Contact a health care provider if:  You are still sleepy or having  trouble with balance after 24 hours.  You feel light-headed.  You keep feeling nauseous or you keep vomiting.  You develop a rash.  You have a fever.  You have redness or swelling around the IV site. Get help right away if:  You have trouble breathing.  You have new-onset confusion at home. Summary  After the procedure, it is common to feel sleepy, have impaired judgment, or feel nauseous if you eat too soon.  Rest after you get home. Know the things you should not do after the procedure.  Follow the diet recommended by your health care provider and drink enough fluid to keep your urine pale yellow.  Get help right away if you have trouble breathing or new-onset confusion at home. This information is not intended to replace advice given to you by your health care provider. Make sure you discuss any questions you have with your health   care provider. Document Revised: 11/14/2019 Document Reviewed: 06/12/2019 Elsevier Patient Education  2021 Elsevier Inc.  

## 2020-09-17 NOTE — Sedation Documentation (Signed)
Patient is resting comfortably, snoring lightly, in NAD. 

## 2020-09-17 NOTE — Procedures (Signed)
Interventional Radiology Procedure:   Indications: Kaposi Sarcoma.  Procedure: Port placement  Findings: Right jugular port, tip at SVC/RA junction  Complications: None     EBL: Minimal, less than 10 ml  Plan: Discharge in one hour.  Keep port site and incisions dry for at least 24 hours.     Susannah Carbin R. Anselm Pancoast, MD  Pager: [phone]

## 2020-09-17 NOTE — Consult Note (Signed)
Chief Complaint: Patient was seen in consultation today for Port-A-Cath placement  Referring Physician(s): Shadad,Firas N  Supervising Physician: Markus Daft  Patient Status: [place]  History of Present Illness: Austin [name] is a 65 y.o. male with prior history of anemia, aortic valve prosthesis, chronic kidney disease, coronary artery disease, hyperlipidemia, hypertension, thoracic aortic aneurysm with repair, thyroid disease and Kaposi's sarcoma involving upper and lower extremities and diagnosed in 2016, s/p treatment .He now presents with relapse of Kaposi's sarcoma mainly involving the right  lower extremity, has poor venous access and is scheduled today for Port-A-Cath placement for additional chemotherapy.  Past Medical History:  Diagnosis Date  . Anemia    iron def  . Aortic valve prosthesis present    a. SJM - [facility] 2003, chronic coumadin.  . Back pain   . CKD (chronic kidney disease), stage II   . Coronary artery disease    not sure where this dx came from - pt denies  . Family history of melanoma   . Hearing loss   . Hyperlipidemia   . Hypertension   . Internal hemorrhoid   . Kaposi sarcoma ([place])   . Obesity   . Pericardial effusion   . Thoracic aortic aneurysm [facility])    a. s/p dissection and repair @ [facility] 2003 with SJM AVR with residual findings followed by CT.  Marland Kitchen Thyroid disease     Past Surgical History:  Procedure Laterality Date  . ABDOMINAL AORTIC ANEURYSM REPAIR    . AORTIC VALVE REPLACEMENT    . impingment     right shoulder  . KNEE ARTHROSCOPY     left  . WISDOM TOOTH EXTRACTION      Allergies: Celecoxib  Medications: Prior to Admission medications   Medication Sig Start Date End Date Taking? Authorizing Provider  enoxaparin (LOVENOX) 100 MG/ML injection Inject 1 mL (100 mg total) into the skin daily for 13 doses. 09/13/20 09/26/20 Yes [name], MD  fenofibrate 160 MG tablet TAKE 1 TABLET DAILY 08/17/20  Yes [name], MD   ferrous sulfate 325 (65 FE) MG tablet Take 1 tablet (325 mg total) by mouth daily with breakfast. 10/04/15  Yes [name], MD  losartan (COZAAR) 25 MG tablet TAKE 1 TABLET DAILY 08/17/20  Yes [name], MD  metoprolol (TOPROL-XL) 200 MG 24 hr tablet TAKE 1 TABLET DAILY (ANNUAL APPOINTMENT IS DUE, MUST SEE PROVIDER FOR FUTURE REFILLS) 12/04/19  Yes [name], MD  Multiple Vitamin (MULTIVITAMIN) tablet Take 1 tablet by mouth daily.   Yes [provider]  rosuvastatin (CRESTOR) 40 MG tablet TAKE 1 TABLET DAILY (ANNUAL APPOINTMENT IS DUE IN MUST SEE PROVIDER FOR FUTURE REFILLS) 12/04/19  Yes [name], MD  lidocaine-prilocaine (EMLA) cream Apply 1 application topically as needed. 09/03/20   Wyatt Portela, MD  warfarin (COUMADIN) 5 MG tablet TAKE ONE TABLET DAILY EXCEPT TAKE ONE AND ONE-HALF TABLETS ON [facility] OR TAKE AS DIRECTED BY ANTICOAGULATION CLINIC 02/20/19   [name], MD     Family History  Problem Relation Age of Onset  . Stroke Mother        late 57s  . Stroke Father 58  . Liver disease Sister        Sclerosing cholegitis necessitating a liver transplant  . Melanoma Maternal Uncle        dx in his 78s  . Heart disease Paternal Aunt   . Other Maternal Grandfather  died under the age of 28 due to a health related issue  . Colon cancer Neg Hx     Social History   Socioeconomic History  . Marital status: Single    Spouse name: Not on file  . Number of children: 0  . Years of education: MDIV  . Highest education level: Not on file  Occupational History  . Occupation: Civil Service fast streamer: [employer]  Tobacco Use  . Smoking status: Former Smoker    Packs/day: 1.00    Years: 15.00    Pack years: 15.00    Types: Cigarettes    Quit date: 10/10/2001    Years since quitting: 18.9  . Smokeless tobacco: Never Used  Substance and Sexual Activity  . Alcohol use: Yes    Comment: one drink per day  . Drug use: No  . Sexual activity:  Not Currently  Other Topics Concern  . Not on file  Social History Narrative   Pt lives at home alone.   Caffeine Use: Less than 1 cup daily.   Social Determinants of Health   Financial Resource Strain: Not on file  Food Insecurity: Not on file  Transportation Needs: Not on file  Physical Activity: Not on file  Stress: Not on file  Social Connections: Not on file      Review of Systems currently denies fever, headache, chest pain, dyspnea, cough, abdominal/back pain, nausea, vomiting; does have some skin breakdown, weeping from exacerbation of Kaposi's of lower extremities, more so on the right; some edema noted as well  Vital Signs: BP 138/85   Pulse 75   Temp 98.9 F (37.2 C) (Oral)   Resp 18   SpO2 99%   Physical Exam awake, alert.  Chest clear to auscultation bilaterally.  Heart with regular rate rhythm, positive murmur/click.  Abdomen soft, positive bowel sounds, nontender.  Lower extremities with edema, skin breakdown and weeping secondary to involvement with Kaposi's sarcoma, greater on right  Imaging: No results found.  Labs:  CBC: Recent Labs    09/13/20 0906  WBC 9.4  HGB 16.6  HCT 49.2  PLT 253    COAGS: Recent Labs    06/01/20 0000 06/29/20 0000 08/10/20 0000 09/07/20 0000  INR 3.1* 2.2 1.8* 2.3    BMP: Recent Labs    09/13/20 0906  NA 137  K 4.7  CL 106  CO2 24  GLUCOSE 227*  BUN 37*  CALCIUM 8.8*  CREATININE 2.15*  GFRNONAA 33*    LIVER FUNCTION TESTS: Recent Labs    09/13/20 0906  BILITOT 0.5  AST 27  ALT 23  ALKPHOS 31*  PROT 6.2*  ALBUMIN 3.4*    TUMOR MARKERS: No results for input(s): AFPTM, CEA, CA199, CHROMGRNA in the last 8760 hours.  Assessment and Plan: 65 y.o. male with prior history of anemia, aortic valve prosthesis, chronic kidney disease, coronary artery disease, hyperlipidemia, hypertension, thoracic aortic aneurysm with repair, thyroid disease and Kaposi's sarcoma involving upper and lower extremities  and diagnosed in 2016, s/p treatment .He now presents with relapse of Kaposi's sarcoma mainly involving the right  lower extremity, has poor venous access and is scheduled today for Port-A-Cath placement for additional chemotherapy.Risks and benefits of image guided port-a-catheter placement was discussed with the patient including, but not limited to bleeding, infection, pneumothorax, or fibrin sheath development and need for additional procedures.  All of the patient's questions were answered, patient is agreeable to proceed. Consent signed and in chart.  Thank you for this interesting consult.  I greatly enjoyed meeting LARKIN MORELOS and look forward to participating in their care.  A copy of this report was sent to the requesting provider on this date.  Electronically Signed: D. Rowe Robert, PA-C 09/17/2020, 8:44 AM   I spent a total of  25 minutes   in face to face in clinical consultation, greater than 50% of which was counseling/coordinating care for Port-A-Cath placement

## 2020-09-21 ENCOUNTER — Ambulatory Visit (HOSPITAL_COMMUNITY)
Admission: RE | Admit: 2020-09-21 | Discharge: 2020-09-21 | Disposition: A | Discharge status: Home / Self Care | Payer: BC Managed Care – PPO | Source: Ambulatory Visit | Attending: Oncology | Admitting: Oncology

## 2020-09-21 ENCOUNTER — Other Ambulatory Visit: Payer: Self-pay

## 2020-09-21 DIAGNOSIS — C469 Kaposi's sarcoma, unspecified: Secondary | ICD-10-CM | POA: Insufficient documentation

## 2020-09-21 DIAGNOSIS — I313 Pericardial effusion (noninflammatory): Secondary | ICD-10-CM | POA: Insufficient documentation

## 2020-09-21 DIAGNOSIS — I351 Nonrheumatic aortic (valve) insufficiency: Secondary | ICD-10-CM | POA: Insufficient documentation

## 2020-09-21 DIAGNOSIS — E785 Hyperlipidemia, unspecified: Secondary | ICD-10-CM | POA: Diagnosis not present

## 2020-09-21 DIAGNOSIS — Z952 Presence of prosthetic heart valve: Secondary | ICD-10-CM | POA: Diagnosis not present

## 2020-09-21 DIAGNOSIS — I10 Essential (primary) hypertension: Secondary | ICD-10-CM | POA: Diagnosis not present

## 2020-09-21 LAB — ECHOCARDIOGRAM COMPLETE
AR max vel: 1.6 cm2
AV Area VTI: 1.92 cm2
AV Area mean vel: 1.86 cm2
AV Mean grad: 18 mmHg
AV Peak grad: 35.5 mmHg
Ao pk vel: 2.98 m/s
Area-P 1/2: 2.69 cm2
Calc EF: 52.6 %
S' Lateral: 3 cm
Single Plane A2C EF: 51.8 %
Single Plane A4C EF: 52.9 %

## 2020-09-21 NOTE — Progress Notes (Signed)
  Echocardiogram 2D Echocardiogram has been performed.  Michiel Cowboy 09/21/2020, 9:59 AM

## 2020-09-22 ENCOUNTER — Other Ambulatory Visit: Payer: Self-pay | Admitting: Oncology

## 2020-09-22 ENCOUNTER — Other Ambulatory Visit: Payer: Self-pay

## 2020-09-22 ENCOUNTER — Telehealth: Payer: Self-pay

## 2020-09-22 DIAGNOSIS — C469 Kaposi's sarcoma, unspecified: Secondary | ICD-10-CM

## 2020-09-22 MED ORDER — TRAMADOL HCL 50 MG PO TABS: 50.0000 mg | ORAL_TABLET | Freq: Four times a day (QID) | ORAL | 0 refills | Status: DC | PRN

## 2020-09-22 NOTE — Telephone Encounter (Signed)
-----   Message from Wyatt Portela, MD sent at 09/22/2020  1:42 PM EST ----- He needs to continue to cover with clean gauze and keep it clean.  Please setting up with symptom management on Friday.  A prescription for tramadol will be sent to him.  Thanks ----- Message ----- From: Tami Lin, RN Sent: 09/22/2020   1:40 PM EST To: Wyatt Portela, MD  Patient received Doxil on 09/13/20. Patient states in the days following he had pain in the legs, skin peeling, and is secreting a clear sticky liquid from the skin. Patient states his legs look like he has severe sun burn and per patient he feels like he has shed a layer of skin over the past week. Per patient all of these symptoms started after the treatment on 2/14. Patient has tried Tylenol for the pain but states it does not help.  Lanelle Bal

## 2020-09-22 NOTE — Telephone Encounter (Signed)
Called patient and made him aware of Dr. Hazeline Junker instructions. Patient verbalized understanding. Patient scheduled for a visit in Symptom Management on Friday 09/24/20 at 10:00 with a lab appointment at 9:30. Patient is aware of appointments and verbalized understanding.

## 2020-09-23 ENCOUNTER — Other Ambulatory Visit: Payer: Self-pay

## 2020-09-23 ENCOUNTER — Ambulatory Visit: Payer: BC Managed Care – PPO | Admitting: General Practice

## 2020-09-23 DIAGNOSIS — Z9889 Other specified postprocedural states: Secondary | ICD-10-CM | POA: Diagnosis not present

## 2020-09-23 DIAGNOSIS — N1832 Chronic kidney disease, stage 3b: Secondary | ICD-10-CM | POA: Diagnosis not present

## 2020-09-23 DIAGNOSIS — Z7901 Long term (current) use of anticoagulants: Secondary | ICD-10-CM

## 2020-09-23 DIAGNOSIS — Z952 Presence of prosthetic heart valve: Secondary | ICD-10-CM

## 2020-09-23 DIAGNOSIS — D649 Anemia, unspecified: Secondary | ICD-10-CM | POA: Diagnosis not present

## 2020-09-23 DIAGNOSIS — I10 Essential (primary) hypertension: Secondary | ICD-10-CM | POA: Diagnosis not present

## 2020-09-23 LAB — POCT INR: INR: 2.5 (ref 2.0–3.0)

## 2020-09-23 NOTE — Patient Instructions (Addendum)
Pre visit review using our clinic review tool, if applicable. No additional management support is needed unless otherwise documented below in the visit note.  Continue to take 1 tablet daily except take 1/2 tablet on Tuesdays and Fridays.  Re-check in 4 weeks.

## 2020-09-23 NOTE — Progress Notes (Signed)
Medical screening examination/treatment/procedure(s) were performed by non-physician practitioner and as supervising physician I was immediately available for consultation/collaboration. I agree with above. Amahri Dengel, MD   

## 2020-09-24 ENCOUNTER — Inpatient Hospital Stay (HOSPITAL_BASED_OUTPATIENT_CLINIC_OR_DEPARTMENT_OTHER): Payer: BC Managed Care – PPO | Admitting: Medical

## 2020-09-24 ENCOUNTER — Inpatient Hospital Stay: Payer: BC Managed Care – PPO

## 2020-09-24 ENCOUNTER — Other Ambulatory Visit: Payer: Self-pay

## 2020-09-24 VITALS — BP 118/63 | HR 77 | Temp 97.2°F | Resp 18 | Ht 68.0 in | Wt 225.4 lb

## 2020-09-24 DIAGNOSIS — E079 Disorder of thyroid, unspecified: Secondary | ICD-10-CM | POA: Diagnosis not present

## 2020-09-24 DIAGNOSIS — G893 Neoplasm related pain (acute) (chronic): Secondary | ICD-10-CM | POA: Diagnosis not present

## 2020-09-24 DIAGNOSIS — I2510 Atherosclerotic heart disease of native coronary artery without angina pectoris: Secondary | ICD-10-CM | POA: Diagnosis not present

## 2020-09-24 DIAGNOSIS — Z79899 Other long term (current) drug therapy: Secondary | ICD-10-CM | POA: Diagnosis not present

## 2020-09-24 DIAGNOSIS — Z87891 Personal history of nicotine dependence: Secondary | ICD-10-CM | POA: Diagnosis not present

## 2020-09-24 DIAGNOSIS — R600 Localized edema: Secondary | ICD-10-CM

## 2020-09-24 DIAGNOSIS — L03115 Cellulitis of right lower limb: Secondary | ICD-10-CM

## 2020-09-24 DIAGNOSIS — Z7901 Long term (current) use of anticoagulants: Secondary | ICD-10-CM | POA: Diagnosis not present

## 2020-09-24 DIAGNOSIS — C469 Kaposi's sarcoma, unspecified: Secondary | ICD-10-CM | POA: Diagnosis not present

## 2020-09-24 DIAGNOSIS — Z5111 Encounter for antineoplastic chemotherapy: Secondary | ICD-10-CM | POA: Diagnosis not present

## 2020-09-24 DIAGNOSIS — I10 Essential (primary) hypertension: Secondary | ICD-10-CM | POA: Diagnosis not present

## 2020-09-24 DIAGNOSIS — N182 Chronic kidney disease, stage 2 (mild): Secondary | ICD-10-CM | POA: Diagnosis not present

## 2020-09-24 DIAGNOSIS — E785 Hyperlipidemia, unspecified: Secondary | ICD-10-CM | POA: Diagnosis not present

## 2020-09-24 LAB — CMP (CANCER CENTER ONLY)
ALT: 29 U/L (ref 0–44)
AST: 23 U/L (ref 15–41)
Albumin: 2.9 g/dL — ABNORMAL LOW (ref 3.5–5.0)
Alkaline Phosphatase: 31 U/L — ABNORMAL LOW (ref 38–126)
Anion gap: 7 (ref 5–15)
BUN: 32 mg/dL — ABNORMAL HIGH (ref 8–23)
CO2: 20 mmol/L — ABNORMAL LOW (ref 22–32)
Calcium: 8.7 mg/dL — ABNORMAL LOW (ref 8.9–10.3)
Chloride: 108 mmol/L (ref 98–111)
Creatinine: 2.19 mg/dL — ABNORMAL HIGH (ref 0.61–1.24)
GFR, Estimated: 33 mL/min — ABNORMAL LOW (ref >60)
Glucose, Bld: 166 mg/dL — ABNORMAL HIGH (ref 70–99)
Potassium: 4.8 mmol/L (ref 3.5–5.1)
Sodium: 135 mmol/L (ref 135–145)
Total Bilirubin: 0.5 mg/dL (ref 0.3–1.2)
Total Protein: 6.1 g/dL — ABNORMAL LOW (ref 6.5–8.1)

## 2020-09-24 LAB — CBC WITH DIFFERENTIAL (CANCER CENTER ONLY)
Abs Immature Granulocytes: 0.04 10*3/uL (ref 0.00–0.07)
Basophils Absolute: 0 10*3/uL (ref 0.0–0.1)
Basophils Relative: 1 %
Eosinophils Absolute: 0.4 10*3/uL (ref 0.0–0.5)
Eosinophils Relative: 6 %
HCT: 43.8 % (ref 39.0–52.0)
Hemoglobin: 14.6 g/dL (ref 13.0–17.0)
Immature Granulocytes: 1 %
Lymphocytes Relative: 15 %
Lymphs Abs: 1.2 10*3/uL (ref 0.7–4.0)
MCH: 28.6 pg (ref 26.0–34.0)
MCHC: 33.3 g/dL (ref 30.0–36.0)
MCV: 85.7 fL (ref 80.0–100.0)
Monocytes Absolute: 0.6 10*3/uL (ref 0.1–1.0)
Monocytes Relative: 8 %
Neutro Abs: 5.4 10*3/uL (ref 1.7–7.7)
Neutrophils Relative %: 69 %
Platelet Count: 293 10*3/uL (ref 150–400)
RBC: 5.11 MIL/uL (ref 4.22–5.81)
RDW: 13.1 % (ref 11.5–15.5)
WBC Count: 7.7 10*3/uL (ref 4.0–10.5)
nRBC: 0 % (ref 0.0–0.2)

## 2020-09-24 MED ORDER — FUROSEMIDE 20 MG PO TABS
ORAL_TABLET | ORAL | 5 refills | Status: DC
Start: 2020-09-24 — End: 2022-07-05

## 2020-09-24 MED ORDER — AMOXICILLIN-POT CLAVULANATE 875-125 MG PO TABS: 1.0000 | ORAL_TABLET | Freq: Two times a day (BID) | ORAL | 0 refills | Status: DC

## 2020-09-24 MED ORDER — OXYCODONE HCL 5 MG PO TABS: ORAL_TABLET | ORAL | 0 refills | Status: DC

## 2020-09-24 NOTE — Progress Notes (Signed)
Symptoms Management Clinic Progress Note   Austin [name] [id number] [birth year] 65 y.o.  Austin [name] is managed by Dr. Zola [name]  Actively treated with chemotherapy/immunotherapy/hormonal therapy: yes  Current therapy: Doxil  Last treated:  09/13/2020 (cycle 7, day 1)  Next scheduled appointment with provider: 09/30/2020  Assessment: Plan:    Cellulitis of right leg - Plan: amoxicillin-clavulanate (AUGMENTIN) 875-125 MG tablet  Neoplasm related pain - Plan: oxyCODONE (OXY IR/ROXICODONE) 5 MG immediate release tablet  Edema of both lower extremities - Plan: furosemide (LASIX) 20 MG tablet  Kaposi sarcoma (HCC)   Cellulitis and edema of the lower extremities: The patient was given a prescription for Augmentin 875-125 p.o. twice daily x7 days and was given a prescription for Lasix 20 mg with instructions to use 3 days only then repeat as needed for recurrent edema.  He has been referred for home health wound care.  Neoplasm related pain: The patient was given a prescription for cocci Contin IR 5 mg with instructions to begin 1-2 every 4 hours for pain.  He will call should his pain not improve or should he have to use greater than 6 tablets daily at which time he would be placed on a long-acting opioid.  He was told to stop tramadol.  Kaposi's sarcoma: The patient is status post cycle 7, day 1 of Doxil which was dosed on 09/13/2020.  He is scheduled to be seen in follow-up on 09/30/2020.  Please see After Visit Summary for patient specific instructions.  Future Appointments  Date Time Provider [place]  09/30/2020  4:00 PM Austin Portela, MD [facility] None  10/08/2020 10:45 AM [facility] None  10/08/2020 11:00 AM [facility] INFUSION [facility] None  10/08/2020 11:30 AM Austin Portela, MD [facility] None  10/08/2020 12:30 PM [facility] INFUSION [facility] None  10/21/2020  1:30 PM [facility] None  11/05/2020 10:45  AM [facility] LAB [facility] None  11/05/2020 11:00 AM [facility] [place] [facility] None  11/05/2020 11:30 AM [name], Austin Dad, MD [facility] None  11/05/2020 12:30 PM [facility] INFUSION [facility] None    No orders of the defined types were placed in this encounter.      Subjective:   Patient ID:  Austin [name] is a 65 y.o. (DOB July 01, 1956) male.  Chief Complaint: No chief complaint on file.   HPI Austin [name]  is a 65 y.o. 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  He is followed by Dr. Alen [name] and is status post cycle 7, day 1 of Doxil which was dosed on 09/13/2020.  He presents to the clinic today with bilateral lower extremity edema, pain, open lesions, and weeping of his bilateral lower extremities.  He was recently placed on tramadol by Dr. Alen [name] without good relief of his pain.  He denies fevers, chills, sweats, nausea, vomiting, or other issues of concern.  Medications: I have reviewed the patient's current medications.  Allergies:  Allergies  Allergen Reactions  . Celecoxib Itching and Other (See Comments)    Joint pain    Past Medical History:  Diagnosis Date  . Anemia    iron def  . Aortic valve prosthesis present    a. SJM - [facility] 2003, chronic coumadin.  . Back pain   . CKD (chronic kidney disease), stage II   . Coronary artery disease    not sure where this dx came from - pt denies  . Family history of melanoma   . Hearing loss   .  Hyperlipidemia   . Hypertension   . Internal hemorrhoid   . Kaposi sarcoma ([place])   . Obesity   . Pericardial effusion   . Thoracic aortic aneurysm [facility])    a. s/p dissection and repair @ [facility] 2003 with SJM AVR with residual findings followed by CT.  Marland Kitchen Thyroid disease     Past Surgical History:  Procedure Laterality Date  . ABDOMINAL AORTIC ANEURYSM REPAIR    . AORTIC VALVE REPLACEMENT    . impingment     right shoulder  . IR IMAGING GUIDED PORT INSERTION  09/17/2020  . KNEE ARTHROSCOPY     left  . WISDOM  TOOTH EXTRACTION      Family History  Problem Relation Age of Onset  . Stroke Mother        late 36s  . Stroke Father 25  . Liver disease Sister        Sclerosing cholegitis necessitating a liver transplant  . Melanoma Maternal Uncle        dx in his 11s  . Heart disease Paternal Aunt   . Other Maternal Grandfather        died under the age of 69 due to a health related issue  . Colon cancer Neg Hx     Social History   Socioeconomic History  . Marital status: Single    Spouse name: Not on file  . Number of children: 0  . Years of education: MDIV  . Highest education level: Not on file  Occupational History  . 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 Smoking status: Former Smoker    Packs/day: 1.00    Years: 15.00    Pack years: 15.00    Types: Cigarettes    Quit date: 10/10/2001    Years since quitting: 18.9  . Smokeless tobacco: Never Used  Substance and Sexual Activity  . Alcohol use: Yes    Comment: one drink per day  . Drug use: No  . Sexual activity: Not Currently  Other Topics Concern  . Not on file  Social History Narrative   Pt lives at home alone.   Caffeine Use: Less than 1 cup daily.   Social Determinants of Health   Financial Resource Strain: Not on file  Food Insecurity: Not on file  Transportation Needs: Not on file  Physical Activity: Not on file  Stress: Not on file  Social Connections: Not on file  Intimate Partner Violence: Not on file    Past Medical History, Surgical history, Social history, and Family history were reviewed and updated as appropriate.   Please see review of systems for further details on the patient's review from today.   Review of Systems:  Review of Systems  Constitutional: Negative for chills, diaphoresis and fever.  HENT: Negative for trouble swallowing and voice change.   Respiratory: Negative for cough, chest tightness, shortness of breath and wheezing.   Cardiovascular:  Positive for leg swelling. Negative for chest pain and palpitations.  Gastrointestinal: Negative for abdominal pain, constipation, diarrhea, nausea and vomiting.  Musculoskeletal: Negative for back pain and myalgias.  Skin: Positive for rash and wound.       Weeping of the left lower extremity with bilateral edema and significant crusting of the bilateral lower extremities.  Neurological: Negative for dizziness, light-headedness and headaches.    Objective:   Physical Exam:  BP 118/63 (BP Location: Left Arm, Patient Position: Sitting)   Pulse 77  Temp (!) 97.2 F (36.2 C) (Tympanic)   Resp 18   Ht 5\' 8"  (1.727 m)   Wt 225 lb 6.4 oz (102.2 kg)   SpO2 99%   BMI 34.27 kg/m  ECOG: 1  Physical Exam Constitutional:      General: He is not in acute distress.    Appearance: Normal appearance. He is not ill-appearing.  HENT:     Head: Normocephalic and atraumatic.  Eyes:     General: No scleral icterus.       Right eye: No discharge.        Left eye: No discharge.     Conjunctiva/sclera: Conjunctivae normal.  Musculoskeletal:     Right lower leg: Edema present.     Left lower leg: Edema present.  Skin:    Findings: Erythema, lesion and rash present.       Neurological:     Mental Status: He is alert.     Coordination: Coordination normal.     Gait: Gait normal.  Psychiatric:        Mood and Affect: Mood normal.        Behavior: Behavior normal.        Thought Content: Thought content normal.        Judgment: Judgment normal.     Lab Review:     Component Value Date/Time   NA 135 09/24/2020 0928   NA 142 05/26/2019 1020   NA 143 11/03/2016 0908   K 4.8 09/24/2020 0928   K 5.0 11/03/2016 0908   CL 108 09/24/2020 0928   CO2 20 (L) 09/24/2020 0928   CO2 27 11/03/2016 0908   GLUCOSE 166 (H) 09/24/2020 0928   GLUCOSE 70 11/03/2016 0908   BUN 32 (H) 09/24/2020 0928   BUN 24 05/26/2019 1020   BUN 33.5 (H) 11/03/2016 0908   CREATININE 2.19 (H) 09/24/2020 0928    CREATININE 1.5 (H) 11/03/2016 0908   CALCIUM 8.7 (L) 09/24/2020 0928   CALCIUM 9.5 11/03/2016 0908   PROT 6.1 (L) 09/24/2020 0928   PROT 6.4 11/03/2016 0908   ALBUMIN 2.9 (L) 09/24/2020 0928   ALBUMIN 4.2 11/03/2016 0908   AST 23 09/24/2020 0928   AST 29 11/03/2016 0908   ALT 29 09/24/2020 0928   ALT 29 11/03/2016 0908   ALKPHOS 31 (L) 09/24/2020 0928   ALKPHOS 25 (L) 11/03/2016 0908   BILITOT 0.5 09/24/2020 0928   BILITOT 0.70 11/03/2016 0908   GFRNONAA 33 (L) 09/24/2020 0928   GFRAA 53 (L) 05/26/2019 1020   GFRAA 58 (L) 02/20/2019 0922       Component Value Date/Time   WBC 7.7 09/24/2020 0928   WBC 4.4 09/17/2019 1009   RBC 5.11 09/24/2020 0928   HGB 14.6 09/24/2020 0928   HGB 15.4 11/03/2016 0908   HCT 43.8 09/24/2020 0928   HCT 45.4 11/03/2016 0908   PLT 293 09/24/2020 0928   PLT 158 11/03/2016 0908   MCV 85.7 09/24/2020 0928   MCV 85.7 11/03/2016 0908   MCH 28.6 09/24/2020 0928   MCHC 33.3 09/24/2020 0928   RDW 13.1 09/24/2020 0928   RDW 14.2 11/03/2016 0908   LYMPHSABS 1.2 09/24/2020 0928   LYMPHSABS 1.5 11/03/2016 0908   MONOABS 0.6 09/24/2020 0928   MONOABS 0.4 11/03/2016 0908   EOSABS 0.4 09/24/2020 0928   EOSABS 0.1 11/03/2016 0908   BASOSABS 0.0 09/24/2020 0928   BASOSABS 0.1 11/03/2016 0908   -------------------------------  Imaging from last 24 hours (if applicable):  Radiology  interpretation: ECHOCARDIOGRAM COMPLETE  Result Date: 09/21/2020    ECHOCARDIOGRAM REPORT   Patient Name:   KYLIN DUBS Date of Exam: 09/21/2020 Medical Rec #:  [id number]        Height:       68.0 in Accession #:    [id number]       Weight:       225.0 lb Date of Birth:  29-Jan-1956        BSA:          2.149 m Patient Age:    67 years         BP:           109/75 mmHg Patient Gender: M                HR:           76 bpm. Exam Location:  Outpatient Procedure: 2D Echo, Cardiac Doppler, Color Doppler and Strain Analysis Indications:    Chemo Z09  History:        Patient has  prior history of Echocardiogram examinations, most                 recent 03/18/2018. CAD; Risk Factors:Hypertension and                 Dyslipidemia. Ascending aortic aneurysm/dissection with repair                 at [facility]. Pericardial effusion. 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                 Aortic Valve: St. Jude valve is present in the aortic position.                 Procedure Date: 2003.  Sonographer:    Vickie Epley RDCS Referring Phys: [place]  1. Left ventricular ejection fraction, by estimation, is 50 to 55%. The left ventricle has low normal function. The left ventricle demonstrates regional wall motion abnormalities (see scoring diagram/findings for description). There is mild left ventricular hypertrophy. Left ventricular diastolic parameters are consistent with Grade I diastolic dysfunction (impaired relaxation). There is moderate dyskinesis of the left ventricular, entire septal wall.  2. Right ventricular systolic function is normal. The right ventricular size is normal. There is normal pulmonary artery systolic pressure. The estimated right ventricular systolic pressure is 70.9 mmHg.  3. The mitral valve is grossly normal. Trivial mitral valve regurgitation.  4. The aortic valve has been repaired/replaced. Aortic valve regurgitation is trivial. There is a St. Jude valve present in the aortic position. Procedure Date: 2003. Echo findings are consistent with normal structure and function of the aortic valve prosthesis. Aortic valve mean gradient measures 18.0 mmHg.  5. Aortic dilatation noted and root/ascending aorta has been repaired/replaced. There is mild dilatation of the aortic root, measuring 42 mm. There is mild dilatation of the ascending aorta, measuring 40 mm. Comparison(s): Changes from prior study are noted. 03/18/2018: "Normal" LVEF, 18 mm mechanical AVR gradient, ascending  aorta diameter 40 mm. FINDINGS  Left Ventricle: Left ventricular ejection fraction, by estimation, is 50 to 55%. The left ventricle has low normal function. The left ventricle demonstrates regional wall motion abnormalities. Moderate dyskinesis of the left ventricular,  entire septal wall. The left ventricular internal cavity size was normal in size. There is mild left ventricular hypertrophy. Abnormal (paradoxical) septal motion consistent with post-operative status. Left ventricular diastolic parameters are consistent with Grade I diastolic dysfunction (impaired relaxation). Indeterminate filling pressures. Right Ventricle: The right ventricular size is normal. No increase in right ventricular wall thickness. Right ventricular systolic function is normal. There is normal pulmonary artery systolic pressure. The tricuspid regurgitant velocity is 1.68 m/s, and  with an assumed right atrial pressure of 3 mmHg, the estimated right ventricular systolic pressure is 12.8 mmHg. Left Atrium: Left atrial size was normal in size. Right Atrium: Right atrial size was normal in size. Pericardium: There is no evidence of pericardial effusion. Mitral Valve: The mitral valve is grossly normal. Trivial mitral valve regurgitation. Tricuspid Valve: The tricuspid valve is grossly normal. Tricuspid valve regurgitation is trivial. Aortic Valve: The aortic valve has been repaired/replaced. Aortic valve regurgitation is trivial. Aortic valve mean gradient measures 18.0 mmHg. Aortic valve peak gradient measures 35.5 mmHg. Aortic valve area, by VTI measures 1.92 cm. There is a St. Jude valve present in the aortic position. Procedure Date: 2003. Echo findings are consistent with normal structure and function of the aortic valve prosthesis. Pulmonic Valve: The pulmonic valve was grossly normal. Pulmonic valve regurgitation is not visualized. Aorta: Aortic dilatation noted and the aortic root/ascending aorta has been repaired/replaced. There is  mild dilatation of the aortic root, measuring 42 mm. There is mild dilatation of the ascending aorta, measuring 40 mm. IAS/Shunts: No atrial level shunt detected by color flow Doppler.  LEFT VENTRICLE PLAX 2D LVIDd:         4.60 cm      Diastology LVIDs:         3.00 cm      LV e' medial:    4.78 cm/s LV PW:         1.10 cm      LV E/e' medial:  22.0 LV IVS:        1.10 cm      LV e' lateral:   9.65 cm/s LVOT diam:     2.30 cm      LV E/e' lateral: 10.9 LV SV:         106 LV SV Index:   50 LVOT Area:     4.15 cm  LV Volumes (MOD) LV vol d, MOD A2C: 107.0 ml LV vol d, MOD A4C: 97.0 ml LV vol s, MOD A2C: 51.6 ml LV vol s, MOD A4C: 45.7 ml LV SV MOD A2C:     55.4 ml LV SV MOD A4C:     97.0 ml LV SV MOD BP:      54.2 ml RIGHT VENTRICLE RV S prime:     7.05 cm/s TAPSE (M-mode): 1.2 cm LEFT ATRIUM           Index       RIGHT ATRIUM           Index LA diam:      4.60 cm 2.14 cm/m  RA Area:     11.80 cm LA Vol (A2C): 28.7 ml 13.36 ml/m RA Volume:   25.20 ml  11.73 ml/m LA Vol (A4C): 39.6 ml 18.43 ml/m  AORTIC VALVE AV Area (Vmax):    1.60 cm AV Area (Vmean):   1.86 cm AV Area (VTI):     1.92 cm AV Vmax:           298.00 cm/s AV Vmean:  189.000 cm/s AV VTI:            0.555 m AV Peak Grad:      35.5 mmHg AV Mean Grad:      18.0 mmHg LVOT Vmax:         115.00 cm/s LVOT Vmean:        84.700 cm/s LVOT VTI:          0.256 m LVOT/AV VTI ratio: 0.46  AORTA Ao Root diam: 4.20 cm Ao Asc diam:  4.00 cm  MITRAL VALVE                TRICUSPID VALVE MV Area (PHT): 2.69 cm     TR Peak grad:   11.3 mmHg MV Decel Time: 282 msec     TR Vmax:        168.00 cm/s MV E velocity: 105.00 cm/s MV A velocity: 88.30 cm/s   SHUNTS MV E/A ratio:  1.19         Systemic VTI:  0.26 m                             Systemic Diam: 2.30 cm Lyman Bishop MD Electronically signed by Lyman Bishop MD Signature Date/Time: 09/21/2020/3:44:29 PM    Final    IR IMAGING GUIDED PORT INSERTION  Result Date: 09/17/2020 INDICATION: 65 year old with  Kaposi sarcoma and undergoing treatment. Port-A-Cath needed due to poor venous access. EXAM: FLUOROSCOPIC AND ULTRASOUND GUIDED PLACEMENT OF A SUBCUTANEOUS PORT COMPARISON:  None. MEDICATIONS: Ancef 2 g; The antibiotic was administered within an appropriate time interval prior to skin puncture. ANESTHESIA/SEDATION: Versed 4.0 mg IV; Fentanyl 50 mcg IV; Moderate Sedation Time:  34 minutes The patient was continuously monitored during the procedure by the interventional radiology nurse under my direct supervision. FLUOROSCOPY TIME:  36 seconds, 9 mGy COMPLICATIONS: None immediate. PROCEDURE: The procedure, risks, benefits, and alternatives were explained to the patient. Questions regarding the procedure were encouraged and answered. The patient understands and consents to the procedure. Patient was placed supine on the interventional table. Ultrasound confirmed a patent right internal jugular vein. Ultrasound image was saved for documentation. The right chest and neck were cleaned with a skin antiseptic and a sterile drape was placed. Maximal barrier sterile technique was utilized including caps, mask, sterile gowns, sterile gloves, sterile drape, hand hygiene and skin antiseptic. The right neck was anesthetized with 1% lidocaine. Small incision was made in the right neck with a blade. Micropuncture set was placed in the right internal jugular vein with ultrasound guidance. The micropuncture wire was used for measurement purposes. The right chest was anesthetized with 1% lidocaine with epinephrine. #15 blade was used to make an incision and a subcutaneous port pocket was formed. [place] was assembled. Subcutaneous tunnel was formed with a stiff tunneling device. The port catheter was brought through the subcutaneous tunnel. The port was placed in the subcutaneous pocket and sutured in place. The micropuncture set was exchanged for a peel-away sheath. The catheter was placed through the peel-away sheath and  the tip was positioned at the superior cavoatrial junction. Catheter placement was confirmed with fluoroscopy. The port was accessed and flushed with heparinized saline. The port pocket was closed using two layers of absorbable sutures and Dermabond. The vein skin site was closed using a single layer of absorbable suture and Dermabond. Sterile dressings were applied. Patient tolerated the procedure well without an immediate complication. Ultrasound and  fluoroscopic images were taken and saved for this procedure. IMPRESSION: Placement of a subcutaneous port device. Catheter tip at the superior cavoatrial junction. Electronically Signed   By: Markus Daft M.D.   On: 09/17/2020 14:15        This case was discussed Dr. Alen [name]. He expressed agreement with my management of this patient.

## 2020-09-30 ENCOUNTER — Inpatient Hospital Stay: Payer: BC Managed Care – PPO | Attending: Oncology | Admitting: Oncology

## 2020-09-30 ENCOUNTER — Other Ambulatory Visit: Payer: Self-pay

## 2020-09-30 VITALS — BP 111/57 | HR 74 | Temp 98.1°F | Resp 20 | Ht 68.0 in | Wt 230.1 lb

## 2020-09-30 DIAGNOSIS — Z7901 Long term (current) use of anticoagulants: Secondary | ICD-10-CM | POA: Insufficient documentation

## 2020-09-30 DIAGNOSIS — Z5111 Encounter for antineoplastic chemotherapy: Secondary | ICD-10-CM | POA: Diagnosis not present

## 2020-09-30 DIAGNOSIS — Z79899 Other long term (current) drug therapy: Secondary | ICD-10-CM | POA: Insufficient documentation

## 2020-09-30 DIAGNOSIS — G893 Neoplasm related pain (acute) (chronic): Secondary | ICD-10-CM | POA: Insufficient documentation

## 2020-09-30 DIAGNOSIS — I429 Cardiomyopathy, unspecified: Secondary | ICD-10-CM | POA: Diagnosis not present

## 2020-09-30 DIAGNOSIS — C469 Kaposi's sarcoma, unspecified: Secondary | ICD-10-CM | POA: Diagnosis not present

## 2020-09-30 DIAGNOSIS — L03115 Cellulitis of right lower limb: Secondary | ICD-10-CM

## 2020-09-30 NOTE — Progress Notes (Signed)
Hematology and Oncology Follow Up Visit  Austin [name] [id number] [birth year] 65 y.o. 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      Past therapy:  Doxil chemotherapy given at 30 mg/m every 4 weeks the first cycle given at [facility] in June 2016.   He completed 6 cycles of therapy in November 2016. He developed relapsed disease in September 2019. Doxil 30 mg/m every 4 weeks restarted in September 2019.  He completed 6 months of therapy.  He developed relapsed disease February 2022 with increased skin lesions on his lower extremities and drainage.  Current therapy: Doxil 60 mg total dose every 4 weeks restarted on September 13, 2020.    Interim History:  Austin [name] returns today for repeat evaluation.  Since the last visit, he started Doxil chemotherapy after developed worsening drainage and erythema in his right lower extremity after his Doxil therapy.  He was prescribed oxycodone and wound care evaluation.  He reports the drainage has improved some in the last few days and oxycodone have helped his pain.  He denies any fevers chills sweats.  He denies any worsening lesions.       Medications: Updated on review. Current Outpatient Medications  Medication Sig Dispense Refill   amoxicillin-clavulanate (AUGMENTIN) 875-125 MG tablet Take 1 tablet by mouth 2 (two) times daily. 14 tablet 0   enoxaparin (LOVENOX) 100 MG/ML injection Inject 1 mL (100 mg total) into the skin daily for 13 doses. 13 mL 0   fenofibrate 160 MG tablet TAKE 1 TABLET DAILY 90 tablet 3   ferrous sulfate 325 (65 FE) MG tablet Take 1 tablet (325 mg total) by mouth daily with breakfast. 30 tablet 3   furosemide (LASIX) 20 MG tablet Take 1 tablet daily x 3 days, then stop. And repeat if swelling recurs. 30 tablet 5    lidocaine-prilocaine (EMLA) cream Apply 1 application topically as needed. 30 g 0   losartan (COZAAR) 25 MG tablet TAKE 1 TABLET DAILY 90 tablet 3   metoprolol (TOPROL-XL) 200 MG 24 hr tablet TAKE 1 TABLET DAILY (ANNUAL APPOINTMENT IS DUE, MUST SEE PROVIDER FOR FUTURE REFILLS) 90 tablet 3   Multiple Vitamin (MULTIVITAMIN) tablet Take 1 tablet by mouth daily.     oxyCODONE (OXY IR/ROXICODONE) 5 MG immediate release tablet 1-2 tablets Q 4 PRN pain 60 tablet 0   rosuvastatin (CRESTOR) 40 MG tablet TAKE 1 TABLET DAILY (ANNUAL APPOINTMENT IS DUE IN MUST SEE PROVIDER FOR FUTURE REFILLS) 90 tablet 3   warfarin (COUMADIN) 5 MG tablet TAKE ONE TABLET DAILY EXCEPT TAKE ONE AND ONE-HALF TABLETS ON WEDNESDAYS OR TAKE AS DIRECTED BY ANTICOAGULATION CLINIC 120 tablet 3   No current facility-administered medications for this visit.     Allergies:  Allergies  Allergen Reactions   Celecoxib Itching and Other (See Comments)    Joint pain       Physical Exam:       Blood pressure (!) 111/57, pulse 74, temperature 98.1 F (36.7 C), temperature source Temporal, resp. rate 20, height 5\' 8"  (1.727 m), weight 230 lb 1.6 oz (104.4 kg), SpO2 98 %.     ECOG: 0     General appearance: Comfortable appearing without any discomfort Head: Normocephalic without any trauma Oropharynx: Mucous membranes are moist and pink without any thrush or ulcers. Eyes: Pupils are equal and  round reactive to light. Lymph nodes: No cervical, supraclavicular, inguinal or axillary lymphadenopathy.   Heart:regular rate and rhythm.  S1 and S2 without leg edema. Lung: Clear without any rhonchi or wheezes.  No dullness to percussion. Abdomin: Soft, nontender, nondistended with good bowel sounds.  No hepatosplenomegaly. Musculoskeletal: No joint deformity or effusion.  Full range of motion noted. Neurological: No deficits noted on motor, sensory and deep tendon reflex exam. Skin: Raised velvety lesions noted on his  lower extremities bilaterally with right more extensive than the left.  Mild erythema drainage noted.  Crusting surface started to develop indicating improvement on the left more than right.       Lab Results: Lab Results  Component Value Date   WBC 7.7 09/24/2020   HGB 14.6 09/24/2020   HCT 43.8 09/24/2020   MCV 85.7 09/24/2020   PLT 293 09/24/2020     Chemistry      Component Value Date/Time   NA 135 09/24/2020 0928   NA 142 05/26/2019 1020   NA 143 11/03/2016 0908   K 4.8 09/24/2020 0928   K 5.0 11/03/2016 0908   CL 108 09/24/2020 0928   CO2 20 (L) 09/24/2020 0928   CO2 27 11/03/2016 0908   BUN 32 (H) 09/24/2020 0928   BUN 24 05/26/2019 1020   BUN 33.5 (H) 11/03/2016 0908   CREATININE 2.19 (H) 09/24/2020 0928   CREATININE 1.5 (H) 11/03/2016 0908      Component Value Date/Time   CALCIUM 8.7 (L) 09/24/2020 0928   CALCIUM 9.5 11/03/2016 0908   ALKPHOS 31 (L) 09/24/2020 0928   ALKPHOS 25 (L) 11/03/2016 0908   AST 23 09/24/2020 0928   AST 29 11/03/2016 0908   ALT 29 09/24/2020 0928   ALT 29 11/03/2016 0908   BILITOT 0.5 09/24/2020 0928   BILITOT 0.70 11/03/2016 0908      Impression and Plan:   65 year old man with:  1.  Relapsed extensive cutaneous Kaposi sarcoma with worsening disease is bilateral lower extremities noted in February 2022.  He is currently receiving Doxil chemotherapy without any major complications.  Risks and benefits of continuing this treatment were discussed.  Alternative options at this time will be radiation therapy as an alternative.  At this time, he is noticing some improvement in his Kaposi's lesions and I recommended continuing.   2. Cardiomyopathy: Repeat echocardiogram on February 22 showed preserved function of 50 to 55%.   3.  Lower extremity erythema and drainage: No evidence of cellulitis at this time and appears to be improving slightly.  I recommended continuing appropriate wound care and will follow up on his home care  referral.     4.  IV access: Port-A-Cath inserted on February 18 without any complications.   5. Follow-up: He will return in 2 weeks for the next cycle of Doxil.  30  minutes were spent on this visit.  The time was dedicated to reviewing his disease status, addressing complications related to his cancer and cancer therapy.  Zola Button, MD 3/3/20224:00 PM

## 2020-10-05 ENCOUNTER — Ambulatory Visit: Payer: BC Managed Care – PPO

## 2020-10-08 ENCOUNTER — Inpatient Hospital Stay: Payer: BC Managed Care – PPO

## 2020-10-08 ENCOUNTER — Inpatient Hospital Stay: Payer: BC Managed Care – PPO | Admitting: Oncology

## 2020-10-08 ENCOUNTER — Other Ambulatory Visit: Payer: Self-pay

## 2020-10-08 VITALS — BP 147/78 | HR 66 | Temp 97.2°F | Resp 17 | Ht 68.0 in | Wt 224.2 lb

## 2020-10-08 DIAGNOSIS — C469 Kaposi's sarcoma, unspecified: Secondary | ICD-10-CM | POA: Diagnosis not present

## 2020-10-08 DIAGNOSIS — G893 Neoplasm related pain (acute) (chronic): Secondary | ICD-10-CM | POA: Diagnosis not present

## 2020-10-08 DIAGNOSIS — Z7901 Long term (current) use of anticoagulants: Secondary | ICD-10-CM | POA: Diagnosis not present

## 2020-10-08 DIAGNOSIS — Z5111 Encounter for antineoplastic chemotherapy: Secondary | ICD-10-CM | POA: Diagnosis not present

## 2020-10-08 DIAGNOSIS — I429 Cardiomyopathy, unspecified: Secondary | ICD-10-CM | POA: Diagnosis not present

## 2020-10-08 DIAGNOSIS — Z79899 Other long term (current) drug therapy: Secondary | ICD-10-CM | POA: Diagnosis not present

## 2020-10-08 DIAGNOSIS — Z95828 Presence of other vascular implants and grafts: Secondary | ICD-10-CM

## 2020-10-08 LAB — CBC WITH DIFFERENTIAL (CANCER CENTER ONLY)
Abs Immature Granulocytes: 0.03 10*3/uL (ref 0.00–0.07)
Basophils Absolute: 0 10*3/uL (ref 0.0–0.1)
Basophils Relative: 1 %
Eosinophils Absolute: 0.3 10*3/uL (ref 0.0–0.5)
Eosinophils Relative: 4 %
HCT: 36.1 % — ABNORMAL LOW (ref 39.0–52.0)
Hemoglobin: 12.1 g/dL — ABNORMAL LOW (ref 13.0–17.0)
Immature Granulocytes: 1 %
Lymphocytes Relative: 17 %
Lymphs Abs: 1.1 10*3/uL (ref 0.7–4.0)
MCH: 28.5 pg (ref 26.0–34.0)
MCHC: 33.5 g/dL (ref 30.0–36.0)
MCV: 84.9 fL (ref 80.0–100.0)
Monocytes Absolute: 0.8 10*3/uL (ref 0.1–1.0)
Monocytes Relative: 12 %
Neutro Abs: 4.4 10*3/uL (ref 1.7–7.7)
Neutrophils Relative %: 65 %
Platelet Count: 329 10*3/uL (ref 150–400)
RBC: 4.25 MIL/uL (ref 4.22–5.81)
RDW: 13 % (ref 11.5–15.5)
WBC Count: 6.6 10*3/uL (ref 4.0–10.5)
nRBC: 0 % (ref 0.0–0.2)

## 2020-10-08 LAB — CMP (CANCER CENTER ONLY)
ALT: 30 U/L (ref 0–44)
AST: 35 U/L (ref 15–41)
Albumin: 3.3 g/dL — ABNORMAL LOW (ref 3.5–5.0)
Alkaline Phosphatase: 32 U/L — ABNORMAL LOW (ref 38–126)
Anion gap: 5 (ref 5–15)
BUN: 20 mg/dL (ref 8–23)
CO2: 24 mmol/L (ref 22–32)
Calcium: 8.8 mg/dL — ABNORMAL LOW (ref 8.9–10.3)
Chloride: 112 mmol/L — ABNORMAL HIGH (ref 98–111)
Creatinine: 1.39 mg/dL — ABNORMAL HIGH (ref 0.61–1.24)
GFR, Estimated: 56 mL/min — ABNORMAL LOW (ref >60)
Glucose, Bld: 150 mg/dL — ABNORMAL HIGH (ref 70–99)
Potassium: 4.1 mmol/L (ref 3.5–5.1)
Sodium: 141 mmol/L (ref 135–145)
Total Bilirubin: 0.4 mg/dL (ref 0.3–1.2)
Total Protein: 6.7 g/dL (ref 6.5–8.1)

## 2020-10-08 MED ORDER — SODIUM CHLORIDE 0.9% FLUSH
10.0000 mL | INTRAVENOUS | Status: DC | PRN
Start: 2020-10-08 — End: 2020-10-08
  Administered 2020-10-08: 10 mL
  Filled 2020-10-08: qty 10

## 2020-10-08 MED ORDER — DEXAMETHASONE SODIUM PHOSPHATE 10 MG/ML IJ SOLN: 10.0000 mg | Freq: Once | INTRAMUSCULAR | Status: DC

## 2020-10-08 MED ORDER — SODIUM CHLORIDE 0.9 % IV SOLN
10.0000 mg | Freq: Once | INTRAVENOUS | Status: DC
  Filled 2020-10-08: qty 1

## 2020-10-08 MED ORDER — HEPARIN SOD (PORK) LOCK FLUSH 100 UNIT/ML IV SOLN
500.0000 [IU] | Freq: Once | INTRAVENOUS | Status: AC | PRN
  Administered 2020-10-08: 500 [IU]
  Filled 2020-10-08: qty 5

## 2020-10-08 MED ORDER — DEXTROSE 5 % IV SOLN
Freq: Once | INTRAVENOUS | Status: AC
  Filled 2020-10-08: qty 250

## 2020-10-08 MED ORDER — SODIUM CHLORIDE 0.9 % IV SOLN
10.0000 mg | Freq: Once | INTRAVENOUS | Status: AC
  Administered 2020-10-08: 10 mg via INTRAVENOUS
  Filled 2020-10-08: qty 10

## 2020-10-08 MED ORDER — DOXORUBICIN HCL LIPOSOMAL CHEMO INJECTION 2 MG/ML
27.0000 mg/m2 | Freq: Once | INTRAVENOUS | Status: AC
  Administered 2020-10-08: 60 mg via INTRAVENOUS
  Filled 2020-10-08: qty 20

## 2020-10-08 MED ORDER — SODIUM CHLORIDE 0.9% FLUSH
10.0000 mL | INTRAVENOUS | Status: DC | PRN
  Administered 2020-10-08: 10 mL via INTRAVENOUS
  Filled 2020-10-08: qty 10

## 2020-10-08 NOTE — Patient Instructions (Signed)
[facility] Discharge Instructions for Patients Receiving Chemotherapy  Today you received the following chemotherapy agents: Liposomal Doxorubicin (Doxil)  To help prevent nausea and vomiting after your treatment, we encourage you to take your nausea medication  as prescribed.    If you develop nausea and vomiting that is not controlled by your nausea medication, call the clinic.   BELOW ARE SYMPTOMS THAT SHOULD BE REPORTED IMMEDIATELY:  *FEVER GREATER THAN 100.5 F  *CHILLS WITH OR WITHOUT FEVER  NAUSEA AND VOMITING THAT IS NOT CONTROLLED WITH YOUR NAUSEA MEDICATION  *UNUSUAL SHORTNESS OF BREATH  *UNUSUAL BRUISING OR BLEEDING  TENDERNESS IN MOUTH AND THROAT WITH OR WITHOUT PRESENCE OF ULCERS  *URINARY PROBLEMS  *BOWEL PROBLEMS  UNUSUAL RASH Items with * indicate a potential emergency and should be followed up as soon as possible.  Feel free to call the clinic should you have any questions or concerns. The clinic phone number is [phone].  Please show the [place] at check-in to the Emergency Department and triage nurse.

## 2020-10-08 NOTE — Progress Notes (Signed)
Hematology and Oncology Follow Up Visit  Austin [name] [id number] [birth year] 65 y.o. 10/08/2020 11:23 AM Austin [name], MDJohn, [name], MD   Principle Diagnosis: 66 year old man with Kaposi's sarcoma diagnosed in 2016.  He presented with extensive manifestations lower extremities with relapsed disease in February 2022.     Past therapy:  Doxil chemotherapy given at 30 mg/m every 4 weeks the first cycle given at [facility] in June 2016.   He completed 6 cycles of therapy in November 2016. He developed relapsed disease in September 2019. Doxil 30 mg/m every 4 weeks restarted in September 2019.  He completed 6 months of therapy.  He developed relapsed disease February 2022 with increased skin lesions on his lower extremities and drainage.  Current therapy: Doxil 60 mg total dose every 4 weeks restarted on September 13, 2020.  He is here for cycle 2 of therapy.  Interim History:  Austin [name] presents today for follow-up.  Since the last visit, he reports no major changes in his health.  He continues to report improvement in his Kaposi's lesions in his lower extremities.  He noted significant decrease in his drainage and pain.  Denies any fevers chills or sweats.  He denies any hospitalization or illnesses.       Medications: Unchanged on review. Current Outpatient Medications  Medication Sig Dispense Refill  . amoxicillin-clavulanate (AUGMENTIN) 875-125 MG tablet Take 1 tablet by mouth 2 (two) times daily. 14 tablet 0  . enoxaparin (LOVENOX) 100 MG/ML injection Inject 1 mL (100 mg total) into the skin daily for 13 doses. 13 mL 0  . fenofibrate 160 MG tablet TAKE 1 TABLET DAILY 90 tablet 3  . ferrous sulfate 325 (65 FE) MG tablet Take 1 tablet (325 mg total) by mouth daily with breakfast. 30 tablet 3  . furosemide (LASIX) 20 MG tablet Take 1 tablet daily x 3 days, then stop. And repeat if swelling recurs. 30 tablet 5  . lidocaine-prilocaine (EMLA) cream Apply 1  application topically as needed. 30 g 0  . losartan (COZAAR) 25 MG tablet TAKE 1 TABLET DAILY 90 tablet 3  . metoprolol (TOPROL-XL) 200 MG 24 hr tablet TAKE 1 TABLET DAILY (ANNUAL APPOINTMENT IS DUE, MUST SEE PROVIDER FOR FUTURE REFILLS) 90 tablet 3  . Multiple Vitamin (MULTIVITAMIN) tablet Take 1 tablet by mouth daily.    Marland Kitchen oxyCODONE (OXY IR/ROXICODONE) 5 MG immediate release tablet 1-2 tablets Q 4 PRN pain 60 tablet 0  . rosuvastatin (CRESTOR) 40 MG tablet TAKE 1 TABLET DAILY (ANNUAL APPOINTMENT IS DUE IN MUST SEE PROVIDER FOR FUTURE REFILLS) 90 tablet 3  . warfarin (COUMADIN) 5 MG tablet TAKE ONE TABLET DAILY EXCEPT TAKE ONE AND ONE-HALF TABLETS ON WEDNESDAYS OR TAKE AS DIRECTED BY ANTICOAGULATION CLINIC 120 tablet 3   No current facility-administered medications for this visit.   Facility-Administered Medications Ordered in Other Visits  Medication Dose Route Frequency Provider Last Rate Last Admin  . sodium chloride flush (NS) 0.9 % injection 10 mL  10 mL Intravenous PRN Austin Portela, MD         Allergies:  Allergies  Allergen Reactions  . Celecoxib Itching and Other (See Comments)    Joint pain       Physical Exam:    Blood pressure (!) 147/78, pulse 66, temperature (!) 97.2 F (36.2 C), temperature source Tympanic, resp. rate 17, height 5\' 8"  (1.727 m), weight 224 lb 3.2 oz (101.7 kg), SpO2 100 %.  ECOG: 0      General appearance: Alert, awake without any distress. Head: Atraumatic without abnormalities Oropharynx: Without any thrush or ulcers. Eyes: No scleral icterus. Lymph nodes: No lymphadenopathy noted in the cervical, supraclavicular, or axillary nodes Heart:regular rate and rhythm, without any murmurs or gallops.   Lung: Clear to auscultation without any rhonchi, wheezes or dullness to percussion. Abdomin: Soft, nontender without any shifting dullness or ascites. Musculoskeletal: No clubbing or cyanosis. Neurological: No motor or sensory  deficits. Skin: Scaly raised lesions on his lower extremities bilaterally.  Right more extensive than the left.  Both exhibiting thicker crusting compared to previous examinations.        Lab Results: Lab Results  Component Value Date   WBC 7.7 09/24/2020   HGB 14.6 09/24/2020   HCT 43.8 09/24/2020   MCV 85.7 09/24/2020   PLT 293 09/24/2020     Chemistry      Component Value Date/Time   NA 135 09/24/2020 0928   NA 142 05/26/2019 1020   NA 143 11/03/2016 0908   K 4.8 09/24/2020 0928   K 5.0 11/03/2016 0908   CL 108 09/24/2020 0928   CO2 20 (L) 09/24/2020 0928   CO2 27 11/03/2016 0908   BUN 32 (H) 09/24/2020 0928   BUN 24 05/26/2019 1020   BUN 33.5 (H) 11/03/2016 0908   CREATININE 2.19 (H) 09/24/2020 0928   CREATININE 1.5 (H) 11/03/2016 0908      Component Value Date/Time   CALCIUM 8.7 (L) 09/24/2020 0928   CALCIUM 9.5 11/03/2016 0908   ALKPHOS 31 (L) 09/24/2020 0928   ALKPHOS 25 (L) 11/03/2016 0908   AST 23 09/24/2020 0928   AST 29 11/03/2016 0908   ALT 29 09/24/2020 0928   ALT 29 11/03/2016 0908   BILITOT 0.5 09/24/2020 0928   BILITOT 0.70 11/03/2016 0908      Impression and Plan:   65 year old man with:  1.  Kaposi's sarcoma involving bilateral lower extremities after relapsed disease with initial diagnosis in 2016.   He has now restarted Doxil therapy and received the first cycle 4 weeks ago.  Risks and benefits of proceeding with cycle 2 of therapy were discussed at this time.  Potential complications including nausea, vomiting, myelosuppression and dermatological toxicities were reiterated.  Alternative treatment options reviewed radiation which is likely less effective given his systemic nature of disease.  He is agreeable to proceed.  Overall he is experiencing clinical benefit and the plan is to continue with the same dose and schedule.   2. Cardiomyopathy: Repeat echocardiogram continues to show adequate EF of over 55%.  No signs or symptoms of  heart failure.   3.  Lower extremity erythema and drainage: Related to Kaposi's sarcoma exacerbation.  Improved with treatment.     4.  IV access: No complications related to Port-A-Cath insertion.  Port-A-Cath remains in use currently.   5. Follow-up: In 4 weeks for repeat evaluation.  30  minutes were spent on this encounter.  The time was dedicated to reviewing disease status, discussing treatment options and future plan of care reviewed.  Zola Button, MD 3/11/202211:23 AM

## 2020-10-11 DIAGNOSIS — Z03818 Encounter for observation for suspected exposure to other biological agents ruled out: Secondary | ICD-10-CM | POA: Diagnosis not present

## 2020-10-12 ENCOUNTER — Telehealth: Payer: Self-pay | Admitting: Oncology

## 2020-10-12 NOTE — Telephone Encounter (Signed)
Scheduled follow-up appointment per 3/11 los. Patient is aware.

## 2020-10-21 ENCOUNTER — Other Ambulatory Visit: Payer: Self-pay | Admitting: General Practice

## 2020-10-21 ENCOUNTER — Other Ambulatory Visit: Payer: Self-pay

## 2020-10-21 ENCOUNTER — Ambulatory Visit: Payer: BC Managed Care – PPO

## 2020-10-21 ENCOUNTER — Ambulatory Visit (INDEPENDENT_AMBULATORY_CARE_PROVIDER_SITE_OTHER): Payer: BC Managed Care – PPO | Admitting: General Practice

## 2020-10-21 DIAGNOSIS — Z7901 Long term (current) use of anticoagulants: Secondary | ICD-10-CM

## 2020-10-21 DIAGNOSIS — Z952 Presence of prosthetic heart valve: Secondary | ICD-10-CM | POA: Diagnosis not present

## 2020-10-21 LAB — POCT INR: INR: 1.4 — AB (ref 2.0–3.0)

## 2020-10-21 NOTE — Progress Notes (Signed)
Medical screening examination/treatment/procedure(s) were performed by non-physician practitioner and as supervising physician I was immediately available for consultation/collaboration. I agree with above. Otto Caraway, MD   

## 2020-10-21 NOTE — Patient Instructions (Signed)
Pre visit review using our clinic review tool, if applicable. No additional management support is needed unless otherwise documented below in the visit note.  Take 1 1/2 tablets today (3/24), 1 tablet tomorrow (3/24) and 1 1/2 tablets on Saturday (3/26).  On Sunday continue to take 1 tablet daily except take 1/2 tablet on Tuesdays and Fridays.  Re-check in 2 weeks.

## 2020-10-28 ENCOUNTER — Ambulatory Visit (INDEPENDENT_AMBULATORY_CARE_PROVIDER_SITE_OTHER): Payer: BC Managed Care – PPO | Admitting: Internal Medicine

## 2020-10-28 ENCOUNTER — Encounter: Payer: Self-pay | Admitting: Internal Medicine

## 2020-10-28 ENCOUNTER — Other Ambulatory Visit: Payer: Self-pay

## 2020-10-28 VITALS — BP 130/76 | HR 54 | Ht 68.0 in | Wt 217.0 lb

## 2020-10-28 DIAGNOSIS — C469 Kaposi's sarcoma, unspecified: Secondary | ICD-10-CM

## 2020-10-28 DIAGNOSIS — E559 Vitamin D deficiency, unspecified: Secondary | ICD-10-CM

## 2020-10-28 DIAGNOSIS — D509 Iron deficiency anemia, unspecified: Secondary | ICD-10-CM | POA: Diagnosis not present

## 2020-10-28 DIAGNOSIS — I10 Essential (primary) hypertension: Secondary | ICD-10-CM

## 2020-10-28 DIAGNOSIS — E039 Hypothyroidism, unspecified: Secondary | ICD-10-CM | POA: Diagnosis not present

## 2020-10-28 DIAGNOSIS — E7800 Pure hypercholesterolemia, unspecified: Secondary | ICD-10-CM | POA: Diagnosis not present

## 2020-10-28 DIAGNOSIS — E538 Deficiency of other specified B group vitamins: Secondary | ICD-10-CM

## 2020-10-28 DIAGNOSIS — N1831 Chronic kidney disease, stage 3a: Secondary | ICD-10-CM

## 2020-10-28 DIAGNOSIS — Z0000 Encounter for general adult medical examination without abnormal findings: Secondary | ICD-10-CM

## 2020-10-28 DIAGNOSIS — R739 Hyperglycemia, unspecified: Secondary | ICD-10-CM

## 2020-10-28 MED ORDER — FENOFIBRATE 160 MG PO TABS: 160.0000 mg | ORAL_TABLET | Freq: Every day | ORAL | 3 refills | Status: DC

## 2020-10-28 MED ORDER — WARFARIN SODIUM 5 MG PO TABS: ORAL_TABLET | ORAL | 3 refills | Status: DC

## 2020-10-28 MED ORDER — ROSUVASTATIN CALCIUM 40 MG PO TABS: ORAL_TABLET | ORAL | 3 refills | Status: DC

## 2020-10-28 MED ORDER — LOSARTAN POTASSIUM 25 MG PO TABS: 25.0000 mg | ORAL_TABLET | Freq: Every day | ORAL | 3 refills | Status: DC

## 2020-10-28 MED ORDER — METOPROLOL SUCCINATE ER 200 MG PO TB24: ORAL_TABLET | ORAL | 3 refills | Status: DC

## 2020-10-28 NOTE — Assessment & Plan Note (Signed)
Lab Results  Component Value Date   CREATININE 1.39 (H) 10/08/2020   Stable overall, cont to avoid nephrotoxins

## 2020-10-28 NOTE — Assessment & Plan Note (Addendum)
Age and sex appropriate education and counseling updated with regular exercise and diet Referrals for preventative services - for cologuard Immunizations addressed - none needed Smoking counseling  - none needed Evidence for depression or other mood disorder - none significant Most recent labs reviewed. I have personally reviewed and have noted: 1) the patient's medical and social history 2) The patient's current medications and supplements 3) The patient's height, weight, and BMI have been recorded in the chart

## 2020-10-28 NOTE — Assessment & Plan Note (Signed)
For f/u iron lab

## 2020-10-28 NOTE — Patient Instructions (Addendum)
You should be notified eventually about the cologuard  Please continue all other medications as before, and refills have been done if requested.  Please have the pharmacy call with any other refills you may need.  Please continue your efforts at being more active, low cholesterol diet, and weight control.  You are otherwise up to date with prevention measures today.  Please keep your appointments with your specialists as you may have planned  Please go to the LAB at the blood drawing area for the tests to be done  You will be contacted by phone if any changes need to be made immediately.  Otherwise, you will receive a letter about your results with an explanation, but please check with MyChart first.  Please remember to sign up for MyChart if you have not done so, as this will be important to you in the future with finding out test results, communicating by private email, and scheduling acute appointments online when needed.  Please make an Appointment to return for your 1 year visit, or sooner if needed

## 2020-10-28 NOTE — Assessment & Plan Note (Signed)
Lab Results  Component Value Date   HGBA1C 6.0 09/17/2019   Stable, pt to continue current medical treatment  - diet

## 2020-10-28 NOTE — Assessment & Plan Note (Signed)
Lab Results  Component Value Date   LDLCALC 88 09/13/2018   Stable, pt to continue current statin crestor 40

## 2020-10-28 NOTE — Progress Notes (Signed)
Patient ID: Austin [name], male   DOB: 1956-01-07, 65 y.o.   MRN: [id number]         Chief Complaint:: wellness exam       HPI:  Austin [name] is a 65 y.o. male here for wellness exam; due for f/u cologuard, o/w up to date with preventive referral and immunizations  No other complaints.  Currently undergoing tx for kaposis.             Wt Readings from Last 3 Encounters:  10/28/20 217 lb (98.4 kg)  10/08/20 224 lb 3.2 oz (101.7 kg)  09/30/20 230 lb 1.6 oz (104.4 kg)   BP Readings from Last 3 Encounters:  10/28/20 130/76  10/08/20 (!) 147/78  09/30/20 (!) 111/57   Immunization History  Administered Date(s) Administered  . Influenza Split 05/15/2011, 07/29/2012  . Influenza Whole 07/19/2005, 04/30/2006, 08/12/2009  . Influenza,inj,Quad PF,6+ Mos 05/13/2013, 07/01/2014, 05/21/2015, 04/21/2016, 04/11/2017, 05/02/2018, 04/22/2019  . Influenza-Unspecified 05/03/2020  . PFIZER(Purple Top)SARS-COV-2 Vaccination 10/13/2019, 11/03/2019, 05/03/2020  . Pneumococcal Conjugate-13 07/03/2013  . Pneumococcal Polysaccharide-23 03/07/2006  . Td 03/30/2009  . Tdap 09/17/2019   Health Maintenance Due  Topic Date Due  . Fecal DNA (Cologuard)  10/10/2020  . COVID-19 Vaccine (4 - Booster for [employer] series) 11/01/2020      Past Medical History:  Diagnosis Date  . Anemia    iron def  . Aortic valve prosthesis present    a. SJM - [facility] 2003, chronic coumadin.  . Back pain   . CKD (chronic kidney disease), stage II   . Coronary artery disease    not sure where this dx came from - pt denies  . Family history of melanoma   . Hearing loss   . Hyperlipidemia   . Hypertension   . Internal hemorrhoid   . Kaposi sarcoma ([place])   . Obesity   . Pericardial effusion   . Thoracic aortic aneurysm [facility])    a. s/p dissection and repair @ [facility] 2003 with SJM AVR with residual findings followed by CT.  Marland Kitchen Thyroid disease    Past Surgical History:  Procedure Laterality Date  . ABDOMINAL AORTIC  ANEURYSM REPAIR    . AORTIC VALVE REPLACEMENT    . impingment     right shoulder  . IR IMAGING GUIDED PORT INSERTION  09/17/2020  . KNEE ARTHROSCOPY     left  . WISDOM TOOTH EXTRACTION      reports that he quit smoking about 19 years ago. His smoking use included cigarettes. He has a 15.00 pack-year smoking history. He has never used smokeless tobacco. He reports current alcohol use. He reports that he does not use drugs. family history includes Heart disease in his paternal aunt; Liver disease in his sister; Melanoma in his maternal uncle; Other in his maternal grandfather; Stroke in his mother; Stroke (age of onset: 9) in his father. Allergies  Allergen Reactions  . Celecoxib Itching and Other (See Comments)    Joint pain   Current Outpatient Medications on File Prior to Visit  Medication Sig Dispense Refill  . ferrous sulfate 325 (65 FE) MG tablet Take 1 tablet (325 mg total) by mouth daily with breakfast. 30 tablet 3  . furosemide (LASIX) 20 MG tablet Take 1 tablet daily x 3 days, then stop. And repeat if swelling recurs. 30 tablet 5  . lidocaine-prilocaine (EMLA) cream Apply 1 application topically as needed. 30 g 0  . Multiple Vitamin (MULTIVITAMIN) tablet Take 1 tablet by mouth  daily.    . oxyCODONE (OXY IR/ROXICODONE) 5 MG immediate release tablet 1-2 tablets Q 4 PRN pain 60 tablet 0  . amoxicillin-clavulanate (AUGMENTIN) 875-125 MG tablet Take 1 tablet by mouth 2 (two) times daily. 14 tablet 0  . enoxaparin (LOVENOX) 100 MG/ML injection Inject 1 mL (100 mg total) into the skin daily for 13 doses. 13 mL 0   No current facility-administered medications on file prior to visit.        ROS:  All others reviewed and negative.  Objective        PE:  BP 130/76   Pulse (!) 54   Ht 5\' 8"  (1.727 m)   Wt 217 lb (98.4 kg)   SpO2 97%   BMI 32.99 kg/m                 Constitutional: Pt appears in NAD               HENT: Head: NCAT.                Right Ear: External ear normal.                  Left Ear: External ear normal.                Eyes: . Pupils are equal, round, and reactive to light. Conjunctivae and EOM are normal               Nose: without d/c or deformity               Neck: Neck supple. Gross normal ROM               Cardiovascular: Normal rate and regular rhythm.                 Pulmonary/Chest: Effort normal and breath sounds without rales or wheezing.                Abd:  Soft, NT, ND, + BS, no organomegaly               Neurological: Pt is alert. At baseline orientation, motor grossly intact               Skin: Skin is warm. No rashes, no other new lesions, LE edema - trace rle weepy area kaposis               Psychiatric: Pt behavior is normal without agitation   Micro: none  Cardiac tracings I have personally interpreted today:  none  Pertinent Radiological findings (summarize): none   Lab Results  Component Value Date   WBC 6.6 10/08/2020   HGB 12.1 (L) 10/08/2020   HCT 36.1 (L) 10/08/2020   PLT 329 10/08/2020   GLUCOSE 150 (H) 10/08/2020   CHOL 196 09/17/2019   TRIG 214.0 (H) 09/17/2019   HDL 44.50 09/17/2019   LDLDIRECT 117.0 09/17/2019   LDLCALC 88 09/13/2018   ALT 30 10/08/2020   AST 35 10/08/2020   NA 141 10/08/2020   K 4.1 10/08/2020   CL 112 (H) 10/08/2020   CREATININE 1.39 (H) 10/08/2020   BUN 20 10/08/2020   CO2 24 10/08/2020   TSH 4.55 (H) 09/17/2019   PSA 1.64 09/17/2019   INR 1.4 (A) 10/21/2020   HGBA1C 6.0 09/17/2019   Assessment/Plan:  THAER Austin [name] is a 65 y.o. Black or African American [2] male with  has a past medical history of Anemia, Aortic valve  prosthesis present, Back pain, CKD (chronic kidney disease), stage II, Coronary artery disease, Family history of melanoma, Hearing loss, Hyperlipidemia, Hypertension, Internal hemorrhoid, Kaposi sarcoma ([place]), Obesity, Pericardial effusion, Thoracic aortic aneurysm ([place]), and Thyroid disease.  Preventative health care Age and sex appropriate education and  counseling updated with regular exercise and diet Referrals for preventative services - for cologuard Immunizations addressed - none needed Smoking counseling  - none needed Evidence for depression or other mood disorder - none significant Most recent labs reviewed. I have personally reviewed and have noted: 1) the patient's medical and social history 2) The patient's current medications and supplements 3) The patient's height, weight, and BMI have been recorded in the chart   Kaposi sarcoma ([place]) Continue ongoing tx per oncology  Unspecified Iron Deficiency Anemia For f/u iron lab  Hypothyroidism Lab Results  Component Value Date   TSH 4.55 (H) 09/17/2019   Stable, pt to continue levothyroxine   Hyperglycemia Lab Results  Component Value Date   HGBA1C 6.0 09/17/2019   Stable, pt to continue current medical treatment  - diet   HLD (hyperlipidemia) Lab Results  Component Value Date   LDLCALC 88 09/13/2018   Stable, pt to continue current statin crestor 40   Essential hypertension BP Readings from Last 3 Encounters:  10/28/20 130/76  10/08/20 (!) 147/78  09/30/20 (!) 111/57   Stable, pt to continue medical treatment lasix, losartan, toprol   CKD (chronic kidney disease) Lab Results  Component Value Date   CREATININE 1.39 (H) 10/08/2020   Stable overall, cont to avoid nephrotoxins   Followup: Return in about 1 year (around 10/28/2021).  Cathlean Cower, MD 10/28/2020 9:15 PM [facility]

## 2020-10-28 NOTE — Assessment & Plan Note (Signed)
Lab Results  Component Value Date   TSH 4.55 (H) 09/17/2019   Stable, pt to continue levothyroxine

## 2020-10-28 NOTE — Assessment & Plan Note (Signed)
Continue ongoing tx per oncology

## 2020-10-28 NOTE — Assessment & Plan Note (Signed)
BP Readings from Last 3 Encounters:  10/28/20 130/76  10/08/20 (!) 147/78  09/30/20 (!) 111/57   Stable, pt to continue medical treatment lasix, losartan, toprol

## 2020-11-04 ENCOUNTER — Ambulatory Visit: Payer: BC Managed Care – PPO | Admitting: General Practice

## 2020-11-04 ENCOUNTER — Other Ambulatory Visit: Payer: Self-pay

## 2020-11-04 DIAGNOSIS — Z7901 Long term (current) use of anticoagulants: Secondary | ICD-10-CM | POA: Diagnosis not present

## 2020-11-04 DIAGNOSIS — Z952 Presence of prosthetic heart valve: Secondary | ICD-10-CM | POA: Diagnosis not present

## 2020-11-04 LAB — POCT INR: INR: 2.2 (ref 2.0–3.0)

## 2020-11-04 NOTE — Patient Instructions (Addendum)
Pre visit review using our clinic review tool, if applicable. No additional management support is needed unless otherwise documented below in the visit note. Continue to take 1 tablet daily except take 1/2 tablet on Tuesdays and Fridays.  Re-check in 2 weeks.

## 2020-11-04 NOTE — Progress Notes (Signed)
Medical screening examination/treatment/procedure(s) were performed by non-physician practitioner and as supervising physician I was immediately available for consultation/collaboration. I agree with above. Simrat Kendrick, MD   

## 2020-11-05 ENCOUNTER — Inpatient Hospital Stay: Payer: BC Managed Care – PPO | Attending: Oncology

## 2020-11-05 ENCOUNTER — Inpatient Hospital Stay (HOSPITAL_BASED_OUTPATIENT_CLINIC_OR_DEPARTMENT_OTHER): Payer: BC Managed Care – PPO | Admitting: Oncology

## 2020-11-05 ENCOUNTER — Inpatient Hospital Stay: Payer: BC Managed Care – PPO

## 2020-11-05 ENCOUNTER — Other Ambulatory Visit: Payer: Self-pay

## 2020-11-05 VITALS — BP 135/90 | HR 62 | Temp 97.0°F | Resp 18 | Wt 219.5 lb

## 2020-11-05 DIAGNOSIS — Z95828 Presence of other vascular implants and grafts: Secondary | ICD-10-CM

## 2020-11-05 DIAGNOSIS — C469 Kaposi's sarcoma, unspecified: Secondary | ICD-10-CM

## 2020-11-05 DIAGNOSIS — C460 Kaposi's sarcoma of skin: Secondary | ICD-10-CM | POA: Insufficient documentation

## 2020-11-05 DIAGNOSIS — Z5111 Encounter for antineoplastic chemotherapy: Secondary | ICD-10-CM | POA: Insufficient documentation

## 2020-11-05 DIAGNOSIS — Z1211 Encounter for screening for malignant neoplasm of colon: Secondary | ICD-10-CM

## 2020-11-05 DIAGNOSIS — Z5189 Encounter for other specified aftercare: Secondary | ICD-10-CM | POA: Insufficient documentation

## 2020-11-05 DIAGNOSIS — G893 Neoplasm related pain (acute) (chronic): Secondary | ICD-10-CM | POA: Diagnosis not present

## 2020-11-05 LAB — CBC WITH DIFFERENTIAL (CANCER CENTER ONLY)
Abs Immature Granulocytes: 0.03 10*3/uL (ref 0.00–0.07)
Basophils Absolute: 0.1 10*3/uL (ref 0.0–0.1)
Basophils Relative: 1 %
Eosinophils Absolute: 0.4 10*3/uL (ref 0.0–0.5)
Eosinophils Relative: 7 %
HCT: 42.2 % (ref 39.0–52.0)
Hemoglobin: 13.8 g/dL (ref 13.0–17.0)
Immature Granulocytes: 1 %
Lymphocytes Relative: 20 %
Lymphs Abs: 1.2 10*3/uL (ref 0.7–4.0)
MCH: 27.8 pg (ref 26.0–34.0)
MCHC: 32.7 g/dL (ref 30.0–36.0)
MCV: 85.1 fL (ref 80.0–100.0)
Monocytes Absolute: 0.8 10*3/uL (ref 0.1–1.0)
Monocytes Relative: 14 %
Neutro Abs: 3.7 10*3/uL (ref 1.7–7.7)
Neutrophils Relative %: 57 %
Platelet Count: 201 10*3/uL (ref 150–400)
RBC: 4.96 MIL/uL (ref 4.22–5.81)
RDW: 13.3 % (ref 11.5–15.5)
WBC Count: 6.2 10*3/uL (ref 4.0–10.5)
nRBC: 0 % (ref 0.0–0.2)

## 2020-11-05 LAB — CMP (CANCER CENTER ONLY)
ALT: 44 U/L (ref 0–44)
AST: 47 U/L — ABNORMAL HIGH (ref 15–41)
Albumin: 3.9 g/dL (ref 3.5–5.0)
Alkaline Phosphatase: 29 U/L — ABNORMAL LOW (ref 38–126)
Anion gap: 12 (ref 5–15)
BUN: 21 mg/dL (ref 8–23)
CO2: 22 mmol/L (ref 22–32)
Calcium: 8.9 mg/dL (ref 8.9–10.3)
Chloride: 109 mmol/L (ref 98–111)
Creatinine: 1.58 mg/dL — ABNORMAL HIGH (ref 0.61–1.24)
GFR, Estimated: 48 mL/min — ABNORMAL LOW (ref >60)
Glucose, Bld: 106 mg/dL — ABNORMAL HIGH (ref 70–99)
Potassium: 4 mmol/L (ref 3.5–5.1)
Sodium: 143 mmol/L (ref 135–145)
Total Bilirubin: 0.5 mg/dL (ref 0.3–1.2)
Total Protein: 6.8 g/dL (ref 6.5–8.1)

## 2020-11-05 MED ORDER — ALTEPLASE 2 MG IJ SOLR
INTRAMUSCULAR | Status: AC
  Filled 2020-11-05: qty 2

## 2020-11-05 MED ORDER — SODIUM CHLORIDE 0.9 % IV SOLN
10.0000 mg | Freq: Once | INTRAVENOUS | Status: AC
  Administered 2020-11-05: 10 mg via INTRAVENOUS
  Filled 2020-11-05: qty 10

## 2020-11-05 MED ORDER — SODIUM CHLORIDE 0.9% FLUSH
10.0000 mL | INTRAVENOUS | Status: DC | PRN
  Administered 2020-11-05: 10 mL
  Filled 2020-11-05: qty 10

## 2020-11-05 MED ORDER — DOXORUBICIN HCL LIPOSOMAL CHEMO INJECTION 2 MG/ML
27.0000 mg/m2 | Freq: Once | INTRAVENOUS | Status: AC
  Administered 2020-11-05: 60 mg via INTRAVENOUS
  Filled 2020-11-05: qty 30

## 2020-11-05 MED ORDER — ALTEPLASE 2 MG IJ SOLR
2.0000 mg | Freq: Once | INTRAMUSCULAR | Status: AC | PRN
  Administered 2020-11-05: 2 mg
  Filled 2020-11-05: qty 2

## 2020-11-05 MED ORDER — DEXTROSE 5 % IV SOLN
Freq: Once | INTRAVENOUS | Status: AC
  Filled 2020-11-05: qty 250

## 2020-11-05 MED ORDER — HEPARIN SOD (PORK) LOCK FLUSH 100 UNIT/ML IV SOLN
500.0000 [IU] | Freq: Once | INTRAVENOUS | Status: AC | PRN
  Administered 2020-11-05: 500 [IU]
  Filled 2020-11-05: qty 5

## 2020-11-05 MED ORDER — SODIUM CHLORIDE 0.9% FLUSH
10.0000 mL | INTRAVENOUS | Status: AC | PRN
  Administered 2020-11-05: 10 mL
  Filled 2020-11-05: qty 10

## 2020-11-05 NOTE — Progress Notes (Signed)
Hematology and Oncology Follow Up Visit  Austin [name] [id number] 1955-10-04 65 y.o. 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  He developed relapsed disease in February 2022.   Past therapy:  Doxil chemotherapy given at 30 mg/m every 4 weeks the first cycle given at [facility] in June 2016.   He completed 6 cycles of therapy in November 2016. He developed relapsed disease in September 2019. Doxil 30 mg/m every 4 weeks restarted in September 2019.  He completed 6 months of therapy.  He developed relapsed disease February 2022 with increased skin lesions on his lower extremities and drainage.  Current therapy: Doxil 60 mg total dose every 4 weeks restarted on September 13, 2020.  He is here for cycle 3 of therapy.  Interim History:  [name] returns today for repeat evaluation.  Since the last visit, he reports no major changes.  He has tolerated Doxil without any complaints.  Denies any nausea, vomiting or abdominal pain.  He denies any hospitalization or illnesses.  He is Kaposi's lesions continues to improve predominantly in his right leg.  His left leg has completely normalized.  His lower extremity edema and pain has resolved at this time.       Medications: Updated on review. Current Outpatient Medications  Medication Sig Dispense Refill  . amoxicillin-clavulanate (AUGMENTIN) 875-125 MG tablet Take 1 tablet by mouth 2 (two) times daily. 14 tablet 0  . enoxaparin (LOVENOX) 100 MG/ML injection Inject 1 mL (100 mg total) into the skin daily for 13 doses. 13 mL 0  . fenofibrate 160 MG tablet Take 1 tablet (160 mg total) by mouth daily. 90 tablet 3  . ferrous sulfate 325 (65 FE) MG tablet Take 1 tablet (325 mg total) by mouth daily with breakfast. 30 tablet 3  . furosemide (LASIX) 20 MG tablet Take 1  tablet daily x 3 days, then stop. And repeat if swelling recurs. 30 tablet 5  . lidocaine-prilocaine (EMLA) cream Apply 1 application topically as needed. 30 g 0  . losartan (COZAAR) 25 MG tablet Take 1 tablet (25 mg total) by mouth daily. 90 tablet 3  . metoprolol (TOPROL-XL) 200 MG 24 hr tablet TAKE 1 TABLET DAILY 90 tablet 3  . Multiple Vitamin (MULTIVITAMIN) tablet Take 1 tablet by mouth daily.    Marland Kitchen oxyCODONE (OXY IR/ROXICODONE) 5 MG immediate release tablet 1-2 tablets Q 4 PRN pain 60 tablet 0  . rosuvastatin (CRESTOR) 40 MG tablet TAKE 1 TABLET DAILY 90 tablet 3  . warfarin (COUMADIN) 5 MG tablet TAKE ONE TABLET DAILY EXCEPT TAKE ONE AND ONE-HALF TABLETS ON WEDNESDAYS OR TAKE AS DIRECTED BY ANTICOAGULATION CLINIC 120 tablet 3   No current facility-administered medications for this visit.     Allergies:  Allergies  Allergen Reactions  . Celecoxib Itching and Other (See Comments)    Joint pain       Physical Exam:   Blood pressure 135/90, pulse 62, temperature (!) 97 F (36.1 C), temperature source Tympanic, resp. rate 18, weight 219 lb 8 oz (99.6 kg), SpO2 100 %.           ECOG: 0     General appearance: Comfortable appearing without any discomfort Head: Normocephalic without any trauma Oropharynx: Mucous membranes are moist and pink without any thrush or ulcers. Eyes: Pupils are equal and round  reactive to light. Lymph nodes: No cervical, supraclavicular, inguinal or axillary lymphadenopathy.   Heart:regular rate and rhythm.  S1 and S2 without leg edema. Lung: Clear without any rhonchi or wheezes.  No dullness to percussion. Abdomin: Soft, nontender, nondistended with good bowel sounds.  No hepatosplenomegaly. Musculoskeletal: No joint deformity or effusion.  Full range of motion noted. Neurological: No deficits noted on motor, sensory and deep tendon reflex exam. Skin: Very little drainage noted in his right lower extremity.  Scaly raised lesions  noted.          Lab Results: Lab Results  Component Value Date   WBC 6.6 10/08/2020   HGB 12.1 (L) 10/08/2020   HCT 36.1 (L) 10/08/2020   MCV 84.9 10/08/2020   PLT 329 10/08/2020     Chemistry      Component Value Date/Time   NA 141 10/08/2020 1130   NA 142 05/26/2019 1020   NA 143 11/03/2016 0908   K 4.1 10/08/2020 1130   K 5.0 11/03/2016 0908   CL 112 (H) 10/08/2020 1130   CO2 24 10/08/2020 1130   CO2 27 11/03/2016 0908   BUN 20 10/08/2020 1130   BUN 24 05/26/2019 1020   BUN 33.5 (H) 11/03/2016 0908   CREATININE 1.39 (H) 10/08/2020 1130   CREATININE 1.5 (H) 11/03/2016 0908      Component Value Date/Time   CALCIUM 8.8 (L) 10/08/2020 1130   CALCIUM 9.5 11/03/2016 0908   ALKPHOS 32 (L) 10/08/2020 1130   ALKPHOS 25 (L) 11/03/2016 0908   AST 35 10/08/2020 1130   AST 29 11/03/2016 0908   ALT 30 10/08/2020 1130   ALT 29 11/03/2016 0908   BILITOT 0.4 10/08/2020 1130   BILITOT 0.70 11/03/2016 0908      Impression and Plan:   65 year old man with:  1.  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    He is currently on Doxil which she has tolerated very well without any major complications.  He has benefited clinically with improvement in his Kaposi's lesions.  Risks and benefits of continuing this treatment were reviewed at this time.  Complications including infusion related issues, hand-foot syndrome, nausea, cardiomyopathy among others.  For the time being is agreeable to continue.    2. Cardiomyopathy: His EF is adequate prior to Doxil restarting treatments.  We will continue to monitor for any clinical signs symptoms for congestive heart failure.   3.  Lower extremity cellulitis: Appears to be improved with the treatment of Kaposi's sarcoma.     4.  IV access: Port-A-Cath remains in place and has required TPA intervention.   5. Follow-up: He will return in 4 weeks for  the next cycle of therapy.  30  minutes were s dedicated to this visit.  Time spent on reviewing laboratory data, disease status update and outlining future plan of care.  Zola Button, MD 4/8/202211:24 AM

## 2020-11-05 NOTE — Progress Notes (Signed)
PAC accessed and flushed easily no blood return, Alteplase instilled and patient taken to lab for lab draw peripherallly

## 2020-11-05 NOTE — Patient Instructions (Signed)
[facility] Discharge Instructions for Patients Receiving Chemotherapy  Today you received the following chemotherapy agents: doxorubicin liposomal.  To help prevent nausea and vomiting after your treatment, we encourage you to take your nausea medication as directed.   If you develop nausea and vomiting that is not controlled by your nausea medication, call the clinic.   BELOW ARE SYMPTOMS THAT SHOULD BE REPORTED IMMEDIATELY:  *FEVER GREATER THAN 100.5 F  *CHILLS WITH OR WITHOUT FEVER  NAUSEA AND VOMITING THAT IS NOT CONTROLLED WITH YOUR NAUSEA MEDICATION  *UNUSUAL SHORTNESS OF BREATH  *UNUSUAL BRUISING OR BLEEDING  TENDERNESS IN MOUTH AND THROAT WITH OR WITHOUT PRESENCE OF ULCERS  *URINARY PROBLEMS  *BOWEL PROBLEMS  UNUSUAL RASH Items with * indicate a potential emergency and should be followed up as soon as possible.  Feel free to call the clinic should you have any questions or concerns. The clinic phone number is [phone].  Please show the [place] at check-in to the Emergency Department and triage nurse.

## 2020-11-08 ENCOUNTER — Other Ambulatory Visit (INDEPENDENT_AMBULATORY_CARE_PROVIDER_SITE_OTHER): Payer: BC Managed Care – PPO

## 2020-11-08 ENCOUNTER — Telehealth: Payer: Self-pay | Admitting: Internal Medicine

## 2020-11-08 DIAGNOSIS — R739 Hyperglycemia, unspecified: Secondary | ICD-10-CM | POA: Diagnosis not present

## 2020-11-08 DIAGNOSIS — Z0000 Encounter for general adult medical examination without abnormal findings: Secondary | ICD-10-CM

## 2020-11-08 DIAGNOSIS — E559 Vitamin D deficiency, unspecified: Secondary | ICD-10-CM | POA: Diagnosis not present

## 2020-11-08 DIAGNOSIS — Z125 Encounter for screening for malignant neoplasm of prostate: Secondary | ICD-10-CM | POA: Diagnosis not present

## 2020-11-08 DIAGNOSIS — E538 Deficiency of other specified B group vitamins: Secondary | ICD-10-CM

## 2020-11-08 DIAGNOSIS — E7800 Pure hypercholesterolemia, unspecified: Secondary | ICD-10-CM | POA: Diagnosis not present

## 2020-11-08 LAB — URINALYSIS, ROUTINE W REFLEX MICROSCOPIC
Bilirubin Urine: NEGATIVE
Hgb urine dipstick: NEGATIVE
Ketones, ur: NEGATIVE
Leukocytes,Ua: NEGATIVE
Nitrite: NEGATIVE
Specific Gravity, Urine: 1.025 (ref 1.000–1.030)
Total Protein, Urine: 30 — AB
Urine Glucose: NEGATIVE
Urobilinogen, UA: 0.2 (ref 0.0–1.0)
pH: 6 (ref 5.0–8.0)

## 2020-11-08 LAB — TSH: TSH: 4.05 u[IU]/mL (ref 0.35–4.50)

## 2020-11-08 LAB — VITAMIN D 25 HYDROXY (VIT D DEFICIENCY, FRACTURES): VITD: 37.75 ng/mL (ref 30.00–100.00)

## 2020-11-08 LAB — LIPID PANEL
Cholesterol: 165 mg/dL (ref 0–200)
HDL: 40.4 mg/dL (ref >39.00)
NonHDL: 124.97
Total CHOL/HDL Ratio: 4
Triglycerides: 239 mg/dL — ABNORMAL HIGH (ref 0.0–149.0)
VLDL: 47.8 mg/dL — ABNORMAL HIGH (ref 0.0–40.0)

## 2020-11-08 LAB — HEMOGLOBIN A1C: Hgb A1c MFr Bld: 6.9 % — ABNORMAL HIGH (ref 4.6–6.5)

## 2020-11-08 LAB — LDL CHOLESTEROL, DIRECT: Direct LDL: 96 mg/dL

## 2020-11-08 LAB — VITAMIN B12: Vitamin B-12: 746 pg/mL (ref 211–911)

## 2020-11-08 LAB — PSA: PSA: 2.77 ng/mL (ref 0.10–4.00)

## 2020-11-08 NOTE — Telephone Encounter (Signed)
Patient called and said that his prescriptions need to go to [facility] [place] instead of the [facility]. He was wondering if they could be sent. Please advise   warfarin (COUMADIN) 5 MG tablet  metoprolol (TOPROL-XL) 200 MG 24 hr tablet  losartan (COZAAR) 25 MG tablet  rosuvastatin (CRESTOR) 40 MG tablet  fenofibrate 160 MG tablet

## 2020-11-09 ENCOUNTER — Other Ambulatory Visit: Payer: Self-pay | Admitting: General Practice

## 2020-11-09 ENCOUNTER — Encounter: Payer: Self-pay | Admitting: Internal Medicine

## 2020-11-09 DIAGNOSIS — Z7901 Long term (current) use of anticoagulants: Secondary | ICD-10-CM

## 2020-11-09 MED ORDER — WARFARIN SODIUM 5 MG PO TABS: ORAL_TABLET | ORAL | 3 refills | Status: DC

## 2020-11-09 MED ORDER — LOSARTAN POTASSIUM 25 MG PO TABS: 25.0000 mg | ORAL_TABLET | Freq: Every day | ORAL | 3 refills | Status: DC

## 2020-11-09 MED ORDER — FENOFIBRATE 160 MG PO TABS: 160.0000 mg | ORAL_TABLET | Freq: Every day | ORAL | 3 refills | Status: DC

## 2020-11-09 MED ORDER — METOPROLOL SUCCINATE ER 200 MG PO TB24: ORAL_TABLET | ORAL | 3 refills | Status: DC

## 2020-11-09 MED ORDER — ROSUVASTATIN CALCIUM 40 MG PO TABS: ORAL_TABLET | ORAL | 3 refills | Status: DC

## 2020-11-09 NOTE — Telephone Encounter (Signed)
Medications sent to pharmacy. Please advise on Warfarin refill.

## 2020-11-22 DIAGNOSIS — Z1211 Encounter for screening for malignant neoplasm of colon: Secondary | ICD-10-CM | POA: Diagnosis not present

## 2020-11-22 LAB — COLOGUARD: Cologuard: NEGATIVE

## 2020-11-23 ENCOUNTER — Other Ambulatory Visit: Payer: Self-pay

## 2020-11-23 ENCOUNTER — Ambulatory Visit (INDEPENDENT_AMBULATORY_CARE_PROVIDER_SITE_OTHER): Payer: BC Managed Care – PPO | Admitting: General Practice

## 2020-11-23 DIAGNOSIS — Z7901 Long term (current) use of anticoagulants: Secondary | ICD-10-CM

## 2020-11-23 DIAGNOSIS — Z952 Presence of prosthetic heart valve: Secondary | ICD-10-CM | POA: Diagnosis not present

## 2020-11-23 LAB — POCT INR: INR: 2.7 (ref 2.0–3.0)

## 2020-11-23 NOTE — Progress Notes (Signed)
Medical screening examination/treatment/procedure(s) were performed by non-physician practitioner and as supervising physician I was immediately available for consultation/collaboration. I agree with above. Teniyah Seivert, MD   

## 2020-11-23 NOTE — Patient Instructions (Addendum)
Pre visit review using our clinic review tool, if applicable. No additional management support is needed unless otherwise documented below in the visit note.  Continue to take 1 tablet daily except take 1/2 tablet on Tuesdays and Fridays.  Re-check in 5 weeks.

## 2020-11-26 LAB — COLOGUARD: Cologuard: NEGATIVE

## 2020-11-27 LAB — COLOGUARD: COLOGUARD: NEGATIVE

## 2020-12-02 ENCOUNTER — Other Ambulatory Visit: Payer: BC Managed Care – PPO

## 2020-12-02 ENCOUNTER — Other Ambulatory Visit: Payer: Self-pay

## 2020-12-02 ENCOUNTER — Inpatient Hospital Stay: Payer: BC Managed Care – PPO

## 2020-12-02 ENCOUNTER — Inpatient Hospital Stay (HOSPITAL_BASED_OUTPATIENT_CLINIC_OR_DEPARTMENT_OTHER): Payer: BC Managed Care – PPO | Admitting: Oncology

## 2020-12-02 ENCOUNTER — Inpatient Hospital Stay: Payer: BC Managed Care – PPO | Attending: Oncology

## 2020-12-02 VITALS — BP 135/57 | HR 64 | Temp 97.6°F | Resp 17 | Ht 68.0 in | Wt 217.6 lb

## 2020-12-02 DIAGNOSIS — C469 Kaposi's sarcoma, unspecified: Secondary | ICD-10-CM

## 2020-12-02 DIAGNOSIS — R972 Elevated prostate specific antigen [PSA]: Secondary | ICD-10-CM

## 2020-12-02 DIAGNOSIS — C460 Kaposi's sarcoma of skin: Secondary | ICD-10-CM | POA: Diagnosis not present

## 2020-12-02 DIAGNOSIS — Z79899 Other long term (current) drug therapy: Secondary | ICD-10-CM | POA: Diagnosis not present

## 2020-12-02 DIAGNOSIS — Z5111 Encounter for antineoplastic chemotherapy: Secondary | ICD-10-CM | POA: Insufficient documentation

## 2020-12-02 DIAGNOSIS — I429 Cardiomyopathy, unspecified: Secondary | ICD-10-CM | POA: Diagnosis not present

## 2020-12-02 DIAGNOSIS — Z7901 Long term (current) use of anticoagulants: Secondary | ICD-10-CM | POA: Diagnosis not present

## 2020-12-02 DIAGNOSIS — Z95828 Presence of other vascular implants and grafts: Secondary | ICD-10-CM | POA: Insufficient documentation

## 2020-12-02 LAB — CMP (CANCER CENTER ONLY)
ALT: 41 U/L (ref 0–44)
AST: 46 U/L — ABNORMAL HIGH (ref 15–41)
Albumin: 4.1 g/dL (ref 3.5–5.0)
Alkaline Phosphatase: 27 U/L — ABNORMAL LOW (ref 38–126)
Anion gap: 8 (ref 5–15)
BUN: 31 mg/dL — ABNORMAL HIGH (ref 8–23)
CO2: 25 mmol/L (ref 22–32)
Calcium: 9.2 mg/dL (ref 8.9–10.3)
Chloride: 109 mmol/L (ref 98–111)
Creatinine: 1.53 mg/dL — ABNORMAL HIGH (ref 0.61–1.24)
GFR, Estimated: 50 mL/min — ABNORMAL LOW (ref >60)
Glucose, Bld: 122 mg/dL — ABNORMAL HIGH (ref 70–99)
Potassium: 4 mmol/L (ref 3.5–5.1)
Sodium: 142 mmol/L (ref 135–145)
Total Bilirubin: 0.6 mg/dL (ref 0.3–1.2)
Total Protein: 6.8 g/dL (ref 6.5–8.1)

## 2020-12-02 LAB — CBC WITH DIFFERENTIAL (CANCER CENTER ONLY)
Abs Immature Granulocytes: 0.02 10*3/uL (ref 0.00–0.07)
Basophils Absolute: 0 10*3/uL (ref 0.0–0.1)
Basophils Relative: 1 %
Eosinophils Absolute: 0.1 10*3/uL (ref 0.0–0.5)
Eosinophils Relative: 2 %
HCT: 41.7 % (ref 39.0–52.0)
Hemoglobin: 13.8 g/dL (ref 13.0–17.0)
Immature Granulocytes: 0 %
Lymphocytes Relative: 17 %
Lymphs Abs: 0.9 10*3/uL (ref 0.7–4.0)
MCH: 28 pg (ref 26.0–34.0)
MCHC: 33.1 g/dL (ref 30.0–36.0)
MCV: 84.6 fL (ref 80.0–100.0)
Monocytes Absolute: 0.8 10*3/uL (ref 0.1–1.0)
Monocytes Relative: 14 %
Neutro Abs: 3.7 10*3/uL (ref 1.7–7.7)
Neutrophils Relative %: 66 %
Platelet Count: 179 10*3/uL (ref 150–400)
RBC: 4.93 MIL/uL (ref 4.22–5.81)
RDW: 13.9 % (ref 11.5–15.5)
WBC Count: 5.5 10*3/uL (ref 4.0–10.5)
nRBC: 0 % (ref 0.0–0.2)

## 2020-12-02 MED ORDER — DEXTROSE 5 % IV SOLN
Freq: Once | INTRAVENOUS | Status: AC
  Filled 2020-12-02: qty 250

## 2020-12-02 MED ORDER — DOXORUBICIN HCL LIPOSOMAL CHEMO INJECTION 2 MG/ML
27.0000 mg/m2 | Freq: Once | INTRAVENOUS | Status: AC
  Administered 2020-12-02: 60 mg via INTRAVENOUS
  Filled 2020-12-02: qty 30

## 2020-12-02 MED ORDER — SODIUM CHLORIDE 0.9% FLUSH
10.0000 mL | Freq: Once | INTRAVENOUS | Status: AC
  Administered 2020-12-02: 10 mL
  Filled 2020-12-02: qty 10

## 2020-12-02 MED ORDER — DEXAMETHASONE SODIUM PHOSPHATE 100 MG/10ML IJ SOLN
10.0000 mg | Freq: Once | INTRAMUSCULAR | Status: AC
Start: 2020-12-02 — End: 2020-12-02
  Administered 2020-12-02: 10 mg via INTRAVENOUS
  Filled 2020-12-02: qty 10

## 2020-12-02 NOTE — Progress Notes (Signed)
Ok to treat per Dr Alen Blew with elevated Scr.

## 2020-12-02 NOTE — Patient Instructions (Signed)
[facility] Discharge Instructions for Patients Receiving Chemotherapy  Today you received the following chemotherapy agents: doxorubicin liposomal.  To help prevent nausea and vomiting after your treatment, we encourage you to take your nausea medication as directed.   If you develop nausea and vomiting that is not controlled by your nausea medication, call the clinic.   BELOW ARE SYMPTOMS THAT SHOULD BE REPORTED IMMEDIATELY:  *FEVER GREATER THAN 100.5 F  *CHILLS WITH OR WITHOUT FEVER  NAUSEA AND VOMITING THAT IS NOT CONTROLLED WITH YOUR NAUSEA MEDICATION  *UNUSUAL SHORTNESS OF BREATH  *UNUSUAL BRUISING OR BLEEDING  TENDERNESS IN MOUTH AND THROAT WITH OR WITHOUT PRESENCE OF ULCERS  *URINARY PROBLEMS  *BOWEL PROBLEMS  UNUSUAL RASH Items with * indicate a potential emergency and should be followed up as soon as possible.  Feel free to call the clinic should you have any questions or concerns. The clinic phone number is [phone].  Please show the CHEMO ALERT CARD at check-in to the Emergency Department and triage nurse.   

## 2020-12-02 NOTE — Progress Notes (Signed)
Hematology and Oncology Follow Up Visit  Austin [name] [id number] [birth year] 65 y.o. 12/02/2020 12:36 PM Austin [name], MDJohn, Austin [name], MD   Principle Diagnosis: 65 year old man with Kaposi's sarcoma of the bilateral lower extremities with extensive lesions noted in February 2022.  He originally presented in 2016.    Past therapy:  Doxil chemotherapy given at 30 mg/m every 4 weeks the first cycle given at [facility] in June 2016.   He completed 6 cycles of therapy in November 2016. He developed relapsed disease in September 2019. Doxil 30 mg/m every 4 weeks restarted in September 2019.  He completed 6 months of therapy.  He developed relapsed disease February 2022 with increased skin lesions on his lower extremities and drainage.  Current therapy: Doxil 60 mg total dose every 4 weeks restarted on September 13, 2020.  He is here for cycle 4 of therapy.  Interim History:  Austin [name] is here for repeat visit.  Since the last visit, he reports no major changes in his health.  He continues to tolerate Doxil without any major complications.  He reports his Kaposi's lesions continues to improve on his extremities.  He denies any nausea, vomiting or skin rash.  He denies any new lesions.       Medications: Unchanged on review. Current Outpatient Medications  Medication Sig Dispense Refill  . amoxicillin-clavulanate (AUGMENTIN) 875-125 MG tablet Take 1 tablet by mouth 2 (two) times daily. 14 tablet 0  . enoxaparin (LOVENOX) 100 MG/ML injection Inject 1 mL (100 mg total) into the skin daily for 13 doses. 13 mL 0  . fenofibrate 160 MG tablet Take 1 tablet (160 mg total) by mouth daily. 90 tablet 3  . ferrous sulfate 325 (65 FE) MG tablet Take 1 tablet (325 mg total) by mouth daily with breakfast. 30 tablet 3  . furosemide (LASIX) 20 MG tablet Take 1 tablet daily x 3 days, then stop. And repeat if swelling recurs. 30 tablet 5  . lidocaine-prilocaine (EMLA) cream Apply  1 application topically as needed. 30 g 0  . losartan (COZAAR) 25 MG tablet Take 1 tablet (25 mg total) by mouth daily. 90 tablet 3  . metoprolol (TOPROL-XL) 200 MG 24 hr tablet TAKE 1 TABLET DAILY 90 tablet 3  . Multiple Vitamin (MULTIVITAMIN) tablet Take 1 tablet by mouth daily.    Marland Kitchen oxyCODONE (OXY IR/ROXICODONE) 5 MG immediate release tablet 1-2 tablets Q 4 PRN pain 60 tablet 0  . rosuvastatin (CRESTOR) 40 MG tablet TAKE 1 TABLET DAILY 90 tablet 3  . warfarin (COUMADIN) 5 MG tablet TAKE ONE TABLET DAILY EXCEPT TAKE ONE AND ONE-HALF TABLETS ON WEDNESDAYS OR TAKE AS DIRECTED BY ANTICOAGULATION CLINIC 120 tablet 3   No current facility-administered medications for this visit.     Allergies:  Allergies  Allergen Reactions  . Celecoxib Itching and Other (See Comments)    Joint pain       Physical Exam:       Blood pressure (!) 135/57, pulse 64, temperature 97.6 F (36.4 C), temperature source Tympanic, resp. rate 17, height 5\' 8"  (1.727 m), weight 217 lb 9.6 oz (98.7 kg), SpO2 100 %.        ECOG: 0    General appearance: Alert, awake without any distress. Head: Atraumatic without abnormalities Oropharynx: Without any thrush or ulcers. Eyes: No scleral icterus. Lymph nodes: No lymphadenopathy noted in the cervical, supraclavicular, or axillary nodes Heart:regular rate and rhythm, without any murmurs or gallops.  Lung: Clear to auscultation without any rhonchi, wheezes or dullness to percussion. Abdomin: Soft, nontender without any shifting dullness or ascites. Musculoskeletal: No clubbing or cyanosis. Neurological: No motor or sensory deficits. Skin: No erythema or induration noted.  Bilateral lower extremity lesions appeared more dry and less scaly.          Lab Results: Lab Results  Component Value Date   WBC 6.2 11/05/2020   HGB 13.8 11/05/2020   HCT 42.2 11/05/2020   MCV 85.1 11/05/2020   PLT 201 11/05/2020     Chemistry      Component Value  Date/Time   NA 143 11/05/2020 1151   NA 142 05/26/2019 1020   NA 143 11/03/2016 0908   K 4.0 11/05/2020 1151   K 5.0 11/03/2016 0908   CL 109 11/05/2020 1151   CO2 22 11/05/2020 1151   CO2 27 11/03/2016 0908   BUN 21 11/05/2020 1151   BUN 24 05/26/2019 1020   BUN 33.5 (H) 11/03/2016 0908   CREATININE 1.58 (H) 11/05/2020 1151   CREATININE 1.5 (H) 11/03/2016 0908      Component Value Date/Time   CALCIUM 8.9 11/05/2020 1151   CALCIUM 9.5 11/03/2016 0908   ALKPHOS 29 (L) 11/05/2020 1151   ALKPHOS 25 (L) 11/03/2016 0908   AST 47 (H) 11/05/2020 1151   AST 29 11/03/2016 0908   ALT 44 11/05/2020 1151   ALT 29 11/03/2016 0908   BILITOT 0.5 11/05/2020 1151   BILITOT 0.70 11/03/2016 0908      Impression and Plan:   65 year old man with:  1. Kaposi's sarcoma with extensive involvement of the skin diagnosed in February 2022.      He has tolerated Doxil without any major complications.  He has experienced clinical benefit with improvement in his bilateral lower extremities with decreased drainage.  Risks and benefits of continuing this treatment long-term were discussed at this time.  The plan is to continue with the same dose and schedule until he achieves a complete response.  He is agreeable to proceed at this time.   2. Cardiomyopathy: Baseline echo remains within normal range and will continue to monitor on Doxil.   3.  IV access: Port-A-Cath continues to be in use without any issues.   4. Follow-up: In 4 weeks for repeat follow-up.  30  minutes were spent on this visit.  The time was dedicated to reviewing disease status, treatment options and outlining future plan of care.  Zola Button, MD 5/5/202212:36 PM

## 2020-12-03 ENCOUNTER — Other Ambulatory Visit: Payer: BC Managed Care – PPO

## 2020-12-03 ENCOUNTER — Ambulatory Visit: Payer: BC Managed Care – PPO

## 2020-12-03 ENCOUNTER — Ambulatory Visit: Payer: BC Managed Care – PPO | Admitting: Oncology

## 2020-12-28 ENCOUNTER — Encounter: Payer: Self-pay | Admitting: Oncology

## 2020-12-28 ENCOUNTER — Other Ambulatory Visit: Payer: Self-pay

## 2020-12-28 ENCOUNTER — Ambulatory Visit (INDEPENDENT_AMBULATORY_CARE_PROVIDER_SITE_OTHER): Payer: BC Managed Care – PPO | Admitting: General Practice

## 2020-12-28 DIAGNOSIS — Z952 Presence of prosthetic heart valve: Secondary | ICD-10-CM

## 2020-12-28 DIAGNOSIS — Z7901 Long term (current) use of anticoagulants: Secondary | ICD-10-CM | POA: Diagnosis not present

## 2020-12-28 LAB — POCT INR: INR: 2.3 (ref 2.0–3.0)

## 2020-12-28 NOTE — Patient Instructions (Signed)
Pre visit review using our clinic review tool, if applicable. No additional management support is needed unless otherwise documented below in the visit note.  Continue to take 1 tablet daily except take 1/2 tablet on Tuesdays and Fridays.  Re-check in 6 weeks.

## 2020-12-28 NOTE — Progress Notes (Signed)
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  He originally presented in 2016.   PRIOR THERAPY: Doxil chemotherapy given at 30 mg/m every 4 weeks the first cycle given at [facility] in June 2016.   He completed 6 cycles of therapy in November 2016. He developed relapsed disease in September 2019. Doxil 30 mg/m every 4 weeks restarted in September 2019.  He completed 6 months of therapy.  He developed relapsed disease February 2022 with increased skin lesions on his lower extremities and drainage.  CURRENT THERAPY: Doxil 60 mg total dose every 4 weeks restarted on September 13, 2020.  He is here for cycle 5 of therapy.  INTERVAL HISTORY: Austin [name] 65 y.o. male returns to the clinic for a follow up visit. The patient is feeling well today without any concerning complaints. The patient continues to tolerate treatment with doxil well without any adverse side effects. Denies any fever, chills, night sweats, or weight loss. Denies any chest pain, shortness of breath, cough, or hemoptysis. Denies any nausea, vomiting, diarrhea, or constipation. Denies any rashes or skin changes. The skin lesions on his lower extremity are stable. Denies new or progressive lesions. The patient is here today for evaluation prior to starting cycle # 5  MEDICAL HISTORY: Past Medical History:  Diagnosis Date  . Anemia    iron def  . Aortic valve prosthesis present    a. SJM - [facility] 2003, chronic coumadin.  . Back pain   . CKD (chronic kidney disease), stage II   . Coronary artery disease    not sure where this dx came from - pt denies  . Family history of melanoma   . Hearing loss   . Hyperlipidemia   . Hypertension   . Internal hemorrhoid   . Kaposi sarcoma ([place])   . Obesity   .  Pericardial effusion   . Thoracic aortic aneurysm [facility])    a. s/p dissection and repair @ [facility] 2003 with SJM AVR with residual findings followed by CT.  Marland Kitchen Thyroid disease     ALLERGIES:  is allergic to celecoxib.  MEDICATIONS:  Current Outpatient Medications  Medication Sig Dispense Refill  . fenofibrate 160 MG tablet Take 1 tablet (160 mg total) by mouth daily. 90 tablet 3  . ferrous sulfate 325 (65 FE) MG tablet Take 1 tablet (325 mg total) by mouth daily with breakfast. 30 tablet 3  . furosemide (LASIX) 20 MG tablet Take 1 tablet daily x 3 days, then stop. And repeat if swelling recurs. 30 tablet 5  . lidocaine-prilocaine (EMLA) cream Apply 1 application topically as needed. 30 g 0  . losartan (COZAAR) 25 MG tablet Take 1 tablet (25 mg total) by mouth daily. 90 tablet 3  . metoprolol (TOPROL-XL) 200 MG 24 hr tablet TAKE 1 TABLET DAILY 90 tablet 3  . Multiple Vitamin (MULTIVITAMIN) tablet Take 1 tablet by mouth daily.    . rosuvastatin (CRESTOR) 40 MG tablet TAKE 1 TABLET DAILY 90 tablet 3  . warfarin (COUMADIN) 5 MG tablet TAKE ONE TABLET DAILY EXCEPT TAKE ONE AND ONE-HALF TABLETS ON WEDNESDAYS OR TAKE AS DIRECTED BY ANTICOAGULATION CLINIC 120 tablet 3   No current facility-administered medications for this visit.   Facility-Administered Medications Ordered in Other Visits  Medication Dose Route Frequency Provider  Last Rate Last Admin  . dexamethasone (DECADRON) 10 mg in sodium chloride 0.9 % 50 mL IVPB  10 mg Intravenous Once Austin [name], Austin Dad, MD      . DOXOrubicin HCL LIPOSOMAL (DOXIL) 60 mg in dextrose 5 % 250 mL chemo infusion  27 mg/m2 (Treatment Plan Recorded) Intravenous Once Austin Portela, MD      . heparin lock flush 100 unit/mL  500 Units Intracatheter Once PRN Austin Portela, MD      . sodium chloride flush (NS) 0.9 % injection 10 mL  10 mL Intracatheter PRN Austin Portela, MD        SURGICAL HISTORY:  Past Surgical History:  Procedure Laterality Date  . ABDOMINAL  AORTIC ANEURYSM REPAIR    . AORTIC VALVE REPLACEMENT    . impingment     right shoulder  . IR IMAGING GUIDED PORT INSERTION  09/17/2020  . KNEE ARTHROSCOPY     left  . WISDOM TOOTH EXTRACTION      REVIEW OF SYSTEMS:   Review of Systems  Constitutional: Negative for appetite change, chills, fatigue, fever and unexpected weight change.  HENT: Negative for mouth sores, nosebleeds, sore throat and trouble swallowing.   Eyes: Negative for eye problems and icterus.  Respiratory: Negative for cough, hemoptysis, shortness of breath and wheezing.   Cardiovascular: Negative for chest pain and leg swelling.  Gastrointestinal: Negative for abdominal pain, constipation, diarrhea, nausea and vomiting.  Genitourinary: Negative for bladder incontinence, difficulty urinating, dysuria, frequency and hematuria.   Musculoskeletal: Negative for back pain, gait problem, neck pain and neck stiffness.  Skin: Negative for itching and rash.  Neurological: Negative for dizziness, extremity weakness, gait problem, headaches, light-headedness and seizures.  Hematological: Negative for adenopathy. Does not bruise/bleed easily.  Psychiatric/Behavioral: Negative for confusion, depression and sleep disturbance. The patient is not nervous/anxious.     PHYSICAL EXAMINATION:  Blood pressure (!) 116/57, pulse 60, temperature (!) 97.4 F (36.3 C), temperature source Tympanic, resp. rate 19, height 5\' 8"  (1.727 m), weight 218 lb 11.2 oz (99.2 kg), SpO2 100 %.  ECOG PERFORMANCE STATUS: 1 - Symptomatic but completely ambulatory  Physical Exam  Constitutional: Oriented to person, place, and time and well-developed, well-nourished, and in no distress. No distress.  HENT:  Head: Normocephalic and atraumatic.  Mouth/Throat: Oropharynx is clear and moist. No oropharyngeal exudate.  Eyes: Conjunctivae are normal. Right eye exhibits no discharge. Left eye exhibits no discharge. No scleral icterus.  Neck: Normal range of  motion. Neck supple.  Cardiovascular: Mechanical heart valve noted. Normal rate, regular rhythm, and intact distal pulses.   Pulmonary/Chest: Effort normal and breath sounds normal. No respiratory distress. No wheezes. No rales.  Abdominal: Soft. Bowel sounds are normal. Exhibits no distension and no mass. There is no tenderness.  Musculoskeletal: Normal range of motion. Exhibits no edema.  Lymphadenopathy:    No cervical adenopathy.  Neurological: Alert and oriented to person, place, and time. Exhibits normal muscle tone. Gait normal. Coordination normal.  Skin: Dry bilateral lower extremity skin lesions. R>L. No drainage.  Psychiatric: Mood, memory and judgment normal.  Vitals reviewed.  LABORATORY DATA: Lab Results  Component Value Date   WBC 3.9 (L) 12/31/2020   HGB 14.1 12/31/2020   HCT 41.9 12/31/2020   MCV 82.8 12/31/2020   PLT 171 12/31/2020      Chemistry      Component Value Date/Time   NA 141 12/31/2020 0902   NA 142 05/26/2019 1020   NA 143 11/03/2016  0908   K 4.2 12/31/2020 0902   K 5.0 11/03/2016 0908   CL 110 12/31/2020 0902   CO2 23 12/31/2020 0902   CO2 27 11/03/2016 0908   BUN 26 (H) 12/31/2020 0902   BUN 24 05/26/2019 1020   BUN 33.5 (H) 11/03/2016 0908   CREATININE 1.41 (H) 12/31/2020 0902   CREATININE 1.5 (H) 11/03/2016 0908      Component Value Date/Time   CALCIUM 9.4 12/31/2020 0902   CALCIUM 9.5 11/03/2016 0908   ALKPHOS 26 (L) 12/31/2020 0902   ALKPHOS 25 (L) 11/03/2016 0908   AST 44 (H) 12/31/2020 0902   AST 29 11/03/2016 0908   ALT 44 12/31/2020 0902   ALT 29 11/03/2016 0908   BILITOT 0.5 12/31/2020 0902   BILITOT 0.70 11/03/2016 0908       RADIOGRAPHIC STUDIES:  No results found.   ASSESSMENT/PLAN:  65 year old man with:  1. Kaposi's sarcoma with extensive involvement of the skin diagnosed in February 2022.      He has tolerated Doxil without any major complications.  He has experienced clinical benefit with improvement  in his bilateral lower extremities with decreased drainage.  Labs were reviewed. Recommend that he proceed with the same dose and schedule until he achieves a complete response.  He is agreeable to proceed at this time.   2. Cardiomyopathy: Baseline echo remains within normal range and will continue to monitor on Doxil.   3.  IV access: Port-A-Cath continues to be in use without any issues.   4. Follow-up: In 4 weeks for repeat follow-up.   No orders of the defined types were placed in this encounter.    I spent 20-29 minutes in this encounter  Austin Nocito L Emerald Gehres, PA-C 12/31/20

## 2020-12-28 NOTE — Progress Notes (Signed)
Medical screening examination/treatment/procedure(s) were performed by non-physician practitioner and as supervising physician I was immediately available for consultation/collaboration. I agree with above. Sugey Trevathan, MD   

## 2020-12-29 ENCOUNTER — Telehealth: Payer: Self-pay | Admitting: Oncology

## 2020-12-29 NOTE — Telephone Encounter (Signed)
Rescheduled 06/03 appointments due to provider pal, patient has been called and notified.

## 2020-12-30 ENCOUNTER — Ambulatory Visit: Payer: BC Managed Care – PPO | Admitting: Oncology

## 2020-12-30 ENCOUNTER — Other Ambulatory Visit: Payer: BC Managed Care – PPO

## 2020-12-30 ENCOUNTER — Ambulatory Visit: Payer: BC Managed Care – PPO

## 2020-12-31 ENCOUNTER — Inpatient Hospital Stay: Payer: BC Managed Care – PPO

## 2020-12-31 ENCOUNTER — Other Ambulatory Visit: Payer: Self-pay

## 2020-12-31 ENCOUNTER — Inpatient Hospital Stay: Payer: BC Managed Care – PPO | Admitting: Oncology

## 2020-12-31 ENCOUNTER — Inpatient Hospital Stay: Payer: BC Managed Care – PPO | Admitting: Physician Assistant

## 2020-12-31 ENCOUNTER — Inpatient Hospital Stay: Payer: BC Managed Care – PPO | Attending: Oncology

## 2020-12-31 VITALS — BP 116/57 | HR 60 | Temp 97.4°F | Resp 19 | Ht 68.0 in | Wt 218.7 lb

## 2020-12-31 DIAGNOSIS — Z7952 Long term (current) use of systemic steroids: Secondary | ICD-10-CM | POA: Insufficient documentation

## 2020-12-31 DIAGNOSIS — R972 Elevated prostate specific antigen [PSA]: Secondary | ICD-10-CM

## 2020-12-31 DIAGNOSIS — Z95828 Presence of other vascular implants and grafts: Secondary | ICD-10-CM

## 2020-12-31 DIAGNOSIS — E079 Disorder of thyroid, unspecified: Secondary | ICD-10-CM | POA: Diagnosis not present

## 2020-12-31 DIAGNOSIS — Z79899 Other long term (current) drug therapy: Secondary | ICD-10-CM | POA: Insufficient documentation

## 2020-12-31 DIAGNOSIS — N182 Chronic kidney disease, stage 2 (mild): Secondary | ICD-10-CM | POA: Insufficient documentation

## 2020-12-31 DIAGNOSIS — C460 Kaposi's sarcoma of skin: Secondary | ICD-10-CM | POA: Insufficient documentation

## 2020-12-31 DIAGNOSIS — Z5111 Encounter for antineoplastic chemotherapy: Secondary | ICD-10-CM | POA: Diagnosis not present

## 2020-12-31 DIAGNOSIS — C469 Kaposi's sarcoma, unspecified: Secondary | ICD-10-CM

## 2020-12-31 DIAGNOSIS — E785 Hyperlipidemia, unspecified: Secondary | ICD-10-CM | POA: Insufficient documentation

## 2020-12-31 DIAGNOSIS — Z7901 Long term (current) use of anticoagulants: Secondary | ICD-10-CM | POA: Insufficient documentation

## 2020-12-31 DIAGNOSIS — I129 Hypertensive chronic kidney disease with stage 1 through stage 4 chronic kidney disease, or unspecified chronic kidney disease: Secondary | ICD-10-CM | POA: Diagnosis not present

## 2020-12-31 LAB — CMP (CANCER CENTER ONLY)
ALT: 44 U/L (ref 0–44)
AST: 44 U/L — ABNORMAL HIGH (ref 15–41)
Albumin: 4.1 g/dL (ref 3.5–5.0)
Alkaline Phosphatase: 26 U/L — ABNORMAL LOW (ref 38–126)
Anion gap: 8 (ref 5–15)
BUN: 26 mg/dL — ABNORMAL HIGH (ref 8–23)
CO2: 23 mmol/L (ref 22–32)
Calcium: 9.4 mg/dL (ref 8.9–10.3)
Chloride: 110 mmol/L (ref 98–111)
Creatinine: 1.41 mg/dL — ABNORMAL HIGH (ref 0.61–1.24)
GFR, Estimated: 55 mL/min — ABNORMAL LOW (ref >60)
Glucose, Bld: 194 mg/dL — ABNORMAL HIGH (ref 70–99)
Potassium: 4.2 mmol/L (ref 3.5–5.1)
Sodium: 141 mmol/L (ref 135–145)
Total Bilirubin: 0.5 mg/dL (ref 0.3–1.2)
Total Protein: 6.5 g/dL (ref 6.5–8.1)

## 2020-12-31 LAB — CBC WITH DIFFERENTIAL (CANCER CENTER ONLY)
Abs Immature Granulocytes: 0.01 10*3/uL (ref 0.00–0.07)
Basophils Absolute: 0 10*3/uL (ref 0.0–0.1)
Basophils Relative: 1 %
Eosinophils Absolute: 0.1 10*3/uL (ref 0.0–0.5)
Eosinophils Relative: 3 %
HCT: 41.9 % (ref 39.0–52.0)
Hemoglobin: 14.1 g/dL (ref 13.0–17.0)
Immature Granulocytes: 0 %
Lymphocytes Relative: 21 %
Lymphs Abs: 0.8 10*3/uL (ref 0.7–4.0)
MCH: 27.9 pg (ref 26.0–34.0)
MCHC: 33.7 g/dL (ref 30.0–36.0)
MCV: 82.8 fL (ref 80.0–100.0)
Monocytes Absolute: 0.5 10*3/uL (ref 0.1–1.0)
Monocytes Relative: 13 %
Neutro Abs: 2.4 10*3/uL (ref 1.7–7.7)
Neutrophils Relative %: 62 %
Platelet Count: 171 10*3/uL (ref 150–400)
RBC: 5.06 MIL/uL (ref 4.22–5.81)
RDW: 14 % (ref 11.5–15.5)
WBC Count: 3.9 10*3/uL — ABNORMAL LOW (ref 4.0–10.5)
nRBC: 0 % (ref 0.0–0.2)

## 2020-12-31 MED ORDER — DEXTROSE 5 % IV SOLN
Freq: Once | INTRAVENOUS | Status: AC
  Filled 2020-12-31: qty 250

## 2020-12-31 MED ORDER — SODIUM CHLORIDE 0.9% FLUSH
10.0000 mL | Freq: Once | INTRAVENOUS | Status: AC
  Administered 2020-12-31: 10 mL
  Filled 2020-12-31: qty 10

## 2020-12-31 MED ORDER — HEPARIN SOD (PORK) LOCK FLUSH 100 UNIT/ML IV SOLN
500.0000 [IU] | Freq: Once | INTRAVENOUS | Status: AC | PRN
  Administered 2020-12-31: 500 [IU]
  Filled 2020-12-31: qty 5

## 2020-12-31 MED ORDER — SODIUM CHLORIDE 0.9% FLUSH
10.0000 mL | INTRAVENOUS | Status: DC | PRN
  Administered 2020-12-31: 10 mL
  Filled 2020-12-31: qty 10

## 2020-12-31 MED ORDER — SODIUM CHLORIDE 0.9 % IV SOLN
10.0000 mg | Freq: Once | INTRAVENOUS | Status: AC
  Administered 2020-12-31: 10 mg via INTRAVENOUS
  Filled 2020-12-31: qty 10

## 2020-12-31 MED ORDER — DOXORUBICIN HCL LIPOSOMAL CHEMO INJECTION 2 MG/ML
27.0000 mg/m2 | Freq: Once | INTRAVENOUS | Status: AC
  Administered 2020-12-31: 60 mg via INTRAVENOUS
  Filled 2020-12-31: qty 30

## 2020-12-31 NOTE — Patient Instructions (Signed)
[facility] ONCOLOGY   Discharge Instructions: Thank you for choosing [facility] to provide your oncology and hematology care.   If you have a lab appointment with the [facility], please go directly to the [facility] and check in at the registration area.   Wear comfortable clothing and clothing appropriate for easy access to any Portacath or PICC line.   We strive to give you quality time with your provider. You may need to reschedule your appointment if you arrive late (15 or more minutes).  Arriving late affects you and other patients whose appointments are after yours.  Also, if you miss three or more appointments without notifying the office, you may be dismissed from the clinic at the provider's discretion.      For prescription refill requests, have your pharmacy contact our office and allow 72 hours for refills to be completed.    Today you received the following chemotherapy and/or immunotherapy agents: Liposomal Doxorubicin (Doxil)   To help prevent nausea and vomiting after your treatment, we encourage you to take your nausea medication as directed.  BELOW ARE SYMPTOMS THAT SHOULD BE REPORTED IMMEDIATELY: . *FEVER GREATER THAN 100.4 F (38 C) OR HIGHER . *CHILLS OR SWEATING . *NAUSEA AND VOMITING THAT IS NOT CONTROLLED WITH YOUR NAUSEA MEDICATION . *UNUSUAL SHORTNESS OF BREATH . *UNUSUAL BRUISING OR BLEEDING . *URINARY PROBLEMS (pain or burning when urinating, or frequent urination) . *BOWEL PROBLEMS (unusual diarrhea, constipation, pain near the anus) . TENDERNESS IN MOUTH AND THROAT WITH OR WITHOUT PRESENCE OF ULCERS (sore throat, sores in mouth, or a toothache) . UNUSUAL RASH, SWELLING OR PAIN  . UNUSUAL VAGINAL DISCHARGE OR ITCHING   Items with * indicate a potential emergency and should be followed up as soon as possible or go to the Emergency Department if any problems should occur.  Please show the CHEMOTHERAPY ALERT CARD or  IMMUNOTHERAPY ALERT CARD at check-in to the Emergency Department and triage nurse.  Should you have questions after your visit or need to cancel or reschedule your appointment, please contact [facility]  Dept: [phone]  and follow the prompts.  Office hours are 8:00 a.m. to 4:30 p.m. Monday - Friday. Please note that voicemails left after 4:00 p.m. may not be returned until the following business day.  We are closed weekends and major holidays. You have access to a nurse at all times for urgent questions. Please call the main number to the clinic Dept: [phone] and follow the prompts.   For any non-urgent questions, you may also contact your provider using MyChart. We now offer e-Visits for anyone 22 and older to request care online for non-urgent symptoms. For details visit [email or web address].   Also download the MyChart app! Go to the app store, search "MyChart", open the app, select [facility], and log in with your MyChart username and password.  Due to Covid, a mask is required upon entering the hospital/clinic. If you do not have a mask, one will be given to you upon arrival. For doctor visits, patients may have 1 support person aged 74 or older with them. For treatment visits, patients cannot have anyone with them due to current Covid guidelines and our immunocompromised population.

## 2021-01-06 ENCOUNTER — Encounter: Payer: Self-pay | Admitting: Internal Medicine

## 2021-01-07 ENCOUNTER — Telehealth: Payer: Self-pay | Admitting: Oncology

## 2021-01-07 NOTE — Telephone Encounter (Signed)
Scheduled per 05/05 los, patient has been called and notified regarding 06/30 appointments.

## 2021-01-27 ENCOUNTER — Inpatient Hospital Stay: Payer: BC Managed Care – PPO

## 2021-01-27 ENCOUNTER — Inpatient Hospital Stay (HOSPITAL_BASED_OUTPATIENT_CLINIC_OR_DEPARTMENT_OTHER): Payer: BC Managed Care – PPO | Admitting: Oncology

## 2021-01-27 ENCOUNTER — Other Ambulatory Visit: Payer: Self-pay

## 2021-01-27 VITALS — BP 136/83 | HR 55 | Temp 98.8°F | Resp 18 | Wt 220.0 lb

## 2021-01-27 DIAGNOSIS — I129 Hypertensive chronic kidney disease with stage 1 through stage 4 chronic kidney disease, or unspecified chronic kidney disease: Secondary | ICD-10-CM | POA: Diagnosis not present

## 2021-01-27 DIAGNOSIS — N182 Chronic kidney disease, stage 2 (mild): Secondary | ICD-10-CM | POA: Diagnosis not present

## 2021-01-27 DIAGNOSIS — C469 Kaposi's sarcoma, unspecified: Secondary | ICD-10-CM

## 2021-01-27 DIAGNOSIS — C460 Kaposi's sarcoma of skin: Secondary | ICD-10-CM | POA: Diagnosis not present

## 2021-01-27 DIAGNOSIS — Z5111 Encounter for antineoplastic chemotherapy: Secondary | ICD-10-CM | POA: Diagnosis not present

## 2021-01-27 DIAGNOSIS — E785 Hyperlipidemia, unspecified: Secondary | ICD-10-CM | POA: Diagnosis not present

## 2021-01-27 DIAGNOSIS — Z7901 Long term (current) use of anticoagulants: Secondary | ICD-10-CM | POA: Diagnosis not present

## 2021-01-27 DIAGNOSIS — R972 Elevated prostate specific antigen [PSA]: Secondary | ICD-10-CM | POA: Diagnosis not present

## 2021-01-27 DIAGNOSIS — E079 Disorder of thyroid, unspecified: Secondary | ICD-10-CM | POA: Diagnosis not present

## 2021-01-27 DIAGNOSIS — Z79899 Other long term (current) drug therapy: Secondary | ICD-10-CM | POA: Diagnosis not present

## 2021-01-27 DIAGNOSIS — Z7952 Long term (current) use of systemic steroids: Secondary | ICD-10-CM | POA: Diagnosis not present

## 2021-01-27 DIAGNOSIS — Z0000 Encounter for general adult medical examination without abnormal findings: Secondary | ICD-10-CM

## 2021-01-27 DIAGNOSIS — Z95828 Presence of other vascular implants and grafts: Secondary | ICD-10-CM

## 2021-01-27 DIAGNOSIS — R739 Hyperglycemia, unspecified: Secondary | ICD-10-CM

## 2021-01-27 LAB — CBC WITH DIFFERENTIAL (CANCER CENTER ONLY)
Abs Immature Granulocytes: 0.03 10*3/uL (ref 0.00–0.07)
Basophils Absolute: 0 10*3/uL (ref 0.0–0.1)
Basophils Relative: 1 %
Eosinophils Absolute: 0.2 10*3/uL (ref 0.0–0.5)
Eosinophils Relative: 4 %
HCT: 42.9 % (ref 39.0–52.0)
Hemoglobin: 14.7 g/dL (ref 13.0–17.0)
Immature Granulocytes: 1 %
Lymphocytes Relative: 21 %
Lymphs Abs: 1 10*3/uL (ref 0.7–4.0)
MCH: 28.3 pg (ref 26.0–34.0)
MCHC: 34.3 g/dL (ref 30.0–36.0)
MCV: 82.5 fL (ref 80.0–100.0)
Monocytes Absolute: 0.7 10*3/uL (ref 0.1–1.0)
Monocytes Relative: 14 %
Neutro Abs: 2.9 10*3/uL (ref 1.7–7.7)
Neutrophils Relative %: 59 %
Platelet Count: 186 10*3/uL (ref 150–400)
RBC: 5.2 MIL/uL (ref 4.22–5.81)
RDW: 13.7 % (ref 11.5–15.5)
WBC Count: 4.8 10*3/uL (ref 4.0–10.5)
nRBC: 0 % (ref 0.0–0.2)

## 2021-01-27 LAB — CMP (CANCER CENTER ONLY)
ALT: 53 U/L — ABNORMAL HIGH (ref 0–44)
AST: 43 U/L — ABNORMAL HIGH (ref 15–41)
Albumin: 4 g/dL (ref 3.5–5.0)
Alkaline Phosphatase: 25 U/L — ABNORMAL LOW (ref 38–126)
Anion gap: 8 (ref 5–15)
BUN: 22 mg/dL (ref 8–23)
CO2: 22 mmol/L (ref 22–32)
Calcium: 9.2 mg/dL (ref 8.9–10.3)
Chloride: 111 mmol/L (ref 98–111)
Creatinine: 1.35 mg/dL — ABNORMAL HIGH (ref 0.61–1.24)
GFR, Estimated: 58 mL/min — ABNORMAL LOW (ref >60)
Glucose, Bld: 107 mg/dL — ABNORMAL HIGH (ref 70–99)
Potassium: 4.1 mmol/L (ref 3.5–5.1)
Sodium: 141 mmol/L (ref 135–145)
Total Bilirubin: 0.6 mg/dL (ref 0.3–1.2)
Total Protein: 6.6 g/dL (ref 6.5–8.1)

## 2021-01-27 MED ORDER — DOXORUBICIN HCL LIPOSOMAL CHEMO INJECTION 2 MG/ML
27.0000 mg/m2 | Freq: Once | INTRAVENOUS | Status: AC
  Administered 2021-01-27: 60 mg via INTRAVENOUS
  Filled 2021-01-27: qty 10

## 2021-01-27 MED ORDER — HEPARIN SOD (PORK) LOCK FLUSH 100 UNIT/ML IV SOLN
500.0000 [IU] | Freq: Once | INTRAVENOUS | Status: AC | PRN
Start: 2021-01-27 — End: 2021-01-27
  Administered 2021-01-27: 500 [IU]
  Filled 2021-01-27: qty 5

## 2021-01-27 MED ORDER — ALTEPLASE 2 MG IJ SOLR
INTRAMUSCULAR | Status: AC
  Filled 2021-01-27: qty 2

## 2021-01-27 MED ORDER — ALTEPLASE 2 MG IJ SOLR
2.0000 mg | Freq: Once | INTRAMUSCULAR | Status: AC
  Administered 2021-01-27: 2 mg
  Filled 2021-01-27: qty 2

## 2021-01-27 MED ORDER — SODIUM CHLORIDE 0.9 % IV SOLN
10.0000 mg | Freq: Once | INTRAVENOUS | Status: AC
  Administered 2021-01-27: 10 mg via INTRAVENOUS
  Filled 2021-01-27: qty 10

## 2021-01-27 MED ORDER — DEXTROSE 5 % IV SOLN
Freq: Once | INTRAVENOUS | Status: AC
Start: 2021-01-27 — End: 2021-01-27
  Filled 2021-01-27: qty 250

## 2021-01-27 MED ORDER — SODIUM CHLORIDE 0.9% FLUSH
10.0000 mL | Freq: Once | INTRAVENOUS | Status: AC
  Administered 2021-01-27: 10 mL
  Filled 2021-01-27: qty 10

## 2021-01-27 MED ORDER — SODIUM CHLORIDE 0.9% FLUSH
10.0000 mL | INTRAVENOUS | Status: DC | PRN
  Administered 2021-01-27: 10 mL
  Filled 2021-01-27: qty 10

## 2021-01-27 NOTE — Patient Instructions (Signed)
[facility]COL[facility]  Discharge Instructions: ?Thank you for choosing [facility] to provide your oncology and hematology care.  ? ?If you have a lab appointment with the Cancer Center, please go directly to the Cancer Center and check in at the registration area. ?  ?Wear comfortable clothing and clothing appropriate for easy access to any Portacath or PICC line.  ? ?We strive to give you quality time with your provider. You may need to reschedule your appointment if you arrive late (15 or more minutes).  Arriving late affects you and other patients whose appointments are after yours.  Also, if you miss three or more appointments without notifying the office, you may be dismissed from the clinic at the provider?s discretion.    ?  ?For prescription refill requests, have your pharmacy contact our office and allow 72 hours for refills to be completed.   ? ?Today you received the following chemotherapy and/or immunotherapy agents : Doxil    ?  ?To help prevent nausea and vomiting after your treatment, we encourage you to take your nausea medication as directed. ? ?BELOW ARE SYMPTOMS THAT SHOULD BE REPORTED IMMEDIATELY: ?*FEVER GREATER THAN 100.4 F (38 ?C) OR HIGHER ?*CHILLS OR SWEATING ?*NAUSEA AND VOMITING THAT IS NOT CONTROLLED WITH YOUR NAUSEA MEDICATION ?*UNUSUAL SHORTNESS OF BREATH ?*UNUSUAL BRUISING OR BLEEDING ?*URINARY PROBLEMS (pain or burning when urinating, or frequent urination) ?*BOWEL PROBLEMS (unusual diarrhea, constipation, pain near the anus) ?TENDERNESS IN MOUTH AND THROAT WITH OR WITHOUT PRESENCE OF ULCERS (sore throat, sores in mouth, or a toothache) ?UNUSUAL RASH, SWELLING OR PAIN  ?UNUSUAL VAGINAL DISCHARGE OR ITCHING  ? ?Items with * indicate a potential emergency and should be followed up as soon as possible or go to the Emergency Department if any problems should occur. ? ?Please show the CHEMOTHERAPY ALERT CARD or IMMUNOTHERAPY ALERT CARD at check-in to the  Emergency Department and triage nurse. ? ?Should you have questions after your visit or need to cancel or reschedule your appointment, please contact [facility]  Dept: [phone]  and follow the prompts.  Office hours are 8:00 a.m. to 4:30 p.m. Monday - Friday. Please note that voicemails left after 4:00 p.m. may not be returned until the following business day.  We are closed weekends and major holidays. You have access to a nurse at all times for urgent questions. Please call the main number to the clinic Dept: [phone] and follow the prompts. ? ? ?For any non-urgent questions, you may also contact your provider using MyChart. We now offer e-Visits for anyone 18 and older to request care online for non-urgent symptoms. For details visit [email or web address]. ?  ?Also download the MyChart app! Go to the app store, search "MyChart", open the app, select [facility], and log in with your MyChart username and password. ? ?Due to Covid, a mask is required upon entering the hospital/clinic. If you do not have a mask, one will be given to you upon arrival. For doctor visits, patients may have 1 support person aged 18 or older with them. For treatment visits, patients cannot have anyone with them due to current Covid guidelines and our immunocompromised population.  ? ?

## 2021-01-27 NOTE — Progress Notes (Signed)
Hematology and Oncology Follow Up Visit  Austin [name] [id number] 01-Dec-1955 65 y.o. 01/27/2021 12:01 PM [name], MDJohn, [name], MD   Principle Diagnosis: 65 year old man with extensive Kaposi's sarcoma with cutaneous involvement of the lower extremities noted in 2016.  He has relapsed disease in February 2022.     Past therapy:  Doxil chemotherapy given at 30 mg/m every 4 weeks the first cycle given at [facility] in June 2016.   He completed 6 cycles of therapy in November 2016. He developed relapsed disease in September 2019. Doxil 30 mg/m every 4 weeks restarted in September 2019.  He completed 6 months of therapy.  He developed relapsed disease February 2022 with increased skin lesions on his lower extremities and drainage.  Current therapy: Doxil 60 mg total dose every 4 weeks restarted on September 13, 2020.  He is here for cycle 6 of therapy.  Interim History:  [name] presents today for a follow-up visit.  Since the last visit, he reports no major changes in his health.  He continues to tolerate Doxil without any complaints.  He denies any nausea, vomiting or abdominal pain.  He denies any worsening neuropathy or dermatological toxicities.  Pulm status quality of life remained excellent.       Medications: Updated on review Current Outpatient Medications  Medication Sig Dispense Refill   fenofibrate 160 MG tablet Take 1 tablet (160 mg total) by mouth daily. 90 tablet 3   ferrous sulfate 325 (65 FE) MG tablet Take 1 tablet (325 mg total) by mouth daily with breakfast. 30 tablet 3   furosemide (LASIX) 20 MG tablet Take 1 tablet daily x 3 days, then stop. And repeat if swelling recurs. 30 tablet 5   lidocaine-prilocaine (EMLA) cream Apply 1 application topically as needed. 30 g 0   losartan (COZAAR) 25 MG tablet Take 1 tablet (25 mg total) by mouth daily. 90 tablet 3   metoprolol (TOPROL-XL) 200 MG 24 hr tablet TAKE 1 TABLET DAILY 90 tablet  3   Multiple Vitamin (MULTIVITAMIN) tablet Take 1 tablet by mouth daily.     rosuvastatin (CRESTOR) 40 MG tablet TAKE 1 TABLET DAILY 90 tablet 3   warfarin (COUMADIN) 5 MG tablet TAKE ONE TABLET DAILY EXCEPT TAKE ONE AND ONE-HALF TABLETS ON WEDNESDAYS OR TAKE AS DIRECTED BY ANTICOAGULATION CLINIC 120 tablet 3   No current facility-administered medications for this visit.   Facility-Administered Medications Ordered in Other Visits  Medication Dose Route Frequency Provider Last Rate Last Admin   sodium chloride flush (NS) 0.9 % injection 10 mL  10 mL Intracatheter Once Wyatt Portela, MD         Allergies:  Allergies  Allergen Reactions   Celecoxib Itching and Other (See Comments)    Joint pain       Physical Exam:           Blood pressure 136/83, pulse (!) 55, temperature 98.8 F (37.1 C), resp. rate 18, weight 220 lb (99.8 kg), SpO2 100 %.     ECOG: 0    General appearance: Comfortable appearing without any discomfort Head: Normocephalic without any trauma Oropharynx: Mucous membranes are moist and pink without any thrush or ulcers. Eyes: Pupils are equal and round reactive to light. Lymph nodes: No cervical, supraclavicular, inguinal or axillary lymphadenopathy.   Heart:regular rate and rhythm.  S1 and S2 without leg edema. Lung: Clear without any rhonchi or wheezes.  No dullness to percussion. Abdomin: Soft,  nontender, nondistended with good bowel sounds.  No hepatosplenomegaly. Musculoskeletal: No joint deformity or effusion.  Full range of motion noted. Neurological: No deficits noted on motor, sensory and deep tendon reflex exam. Skin: No petechial rash or dryness.  Appeared moist.            Lab Results: Lab Results  Component Value Date   WBC 3.9 (L) 12/31/2020   HGB 14.1 12/31/2020   HCT 41.9 12/31/2020   MCV 82.8 12/31/2020   PLT 171 12/31/2020     Chemistry      Component Value Date/Time   NA 141 12/31/2020 0902   NA 142  05/26/2019 1020   NA 143 11/03/2016 0908   K 4.2 12/31/2020 0902   K 5.0 11/03/2016 0908   CL 110 12/31/2020 0902   CO2 23 12/31/2020 0902   CO2 27 11/03/2016 0908   BUN 26 (H) 12/31/2020 0902   BUN 24 05/26/2019 1020   BUN 33.5 (H) 11/03/2016 0908   CREATININE 1.41 (H) 12/31/2020 0902   CREATININE 1.5 (H) 11/03/2016 0908      Component Value Date/Time   CALCIUM 9.4 12/31/2020 0902   CALCIUM 9.5 11/03/2016 0908   ALKPHOS 26 (L) 12/31/2020 0902   ALKPHOS 25 (L) 11/03/2016 0908   AST 44 (H) 12/31/2020 0902   AST 29 11/03/2016 0908   ALT 44 12/31/2020 0902   ALT 29 11/03/2016 0908   BILITOT 0.5 12/31/2020 0902   BILITOT 0.70 11/03/2016 0908      Impression and Plan:   65 year old man with:  1.  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   He continues to have excellent response to Doxil chemotherapy with improvement in his cutaneous lesions.  Risks and benefits of continuing this treatment beyond 6 cycles were discussed.  Complications that include nausea, vomiting, myelosuppression and cardiomyopathy were reiterated.  After discussion today, we opted to proceed with a total of 8 cycles and potentially discontinue treatment after that.   2. Cardiomyopathy: No clinical signs or symptoms of heart failure.  Baseline echocardiogram showed normal EF.   3.  IV access: Port-A-Cath remains in use with difficulty and blood return today.  This is being addressed with tPA currently.   4. Follow-up: In 4 weeks for repeat evaluation and cycle 7.  Cycle 8 will be given in 8 weeks.  30  minutes were dedicated to this encounter.  The time was spent on reviewing laboratory data, disease status update and outlining future plan of care. Zola Button, MD 6/30/202212:01 PM

## 2021-01-27 NOTE — Progress Notes (Signed)
CATHFLO given by Kasandra Knudsen, RN and patient was sent back to the lab.

## 2021-01-28 ENCOUNTER — Encounter: Payer: Self-pay | Admitting: Oncology

## 2021-02-10 ENCOUNTER — Other Ambulatory Visit: Payer: Self-pay

## 2021-02-10 ENCOUNTER — Ambulatory Visit (INDEPENDENT_AMBULATORY_CARE_PROVIDER_SITE_OTHER): Payer: BC Managed Care – PPO | Admitting: General Practice

## 2021-02-10 DIAGNOSIS — Z952 Presence of prosthetic heart valve: Secondary | ICD-10-CM

## 2021-02-10 DIAGNOSIS — Z7901 Long term (current) use of anticoagulants: Secondary | ICD-10-CM

## 2021-02-10 LAB — POCT INR: INR: 2.1 (ref 2.0–3.0)

## 2021-02-10 NOTE — Progress Notes (Signed)
Medical screening examination/treatment/procedure(s) were performed by non-physician practitioner and as supervising physician I was immediately available for consultation/collaboration. I agree with above. Mohannad Olivero, MD   

## 2021-02-10 NOTE — Patient Instructions (Addendum)
Pre visit review using our clinic review tool, if applicable. No additional management support is needed unless otherwise documented below in the visit note.  Continue to take 1 tablet daily except take 1/2 tablet on Tuesdays and Fridays.  Re-check in 6 weeks.

## 2021-02-25 ENCOUNTER — Inpatient Hospital Stay: Payer: BC Managed Care – PPO | Attending: Oncology

## 2021-02-25 ENCOUNTER — Inpatient Hospital Stay: Payer: BC Managed Care – PPO

## 2021-02-25 ENCOUNTER — Inpatient Hospital Stay (HOSPITAL_BASED_OUTPATIENT_CLINIC_OR_DEPARTMENT_OTHER): Payer: BC Managed Care – PPO | Admitting: Physician Assistant

## 2021-02-25 ENCOUNTER — Other Ambulatory Visit: Payer: Self-pay

## 2021-02-25 VITALS — BP 126/74 | HR 57 | Temp 97.4°F | Resp 20 | Wt 220.7 lb

## 2021-02-25 DIAGNOSIS — C460 Kaposi's sarcoma of skin: Secondary | ICD-10-CM | POA: Insufficient documentation

## 2021-02-25 DIAGNOSIS — Z5111 Encounter for antineoplastic chemotherapy: Secondary | ICD-10-CM | POA: Diagnosis not present

## 2021-02-25 DIAGNOSIS — R972 Elevated prostate specific antigen [PSA]: Secondary | ICD-10-CM

## 2021-02-25 DIAGNOSIS — C469 Kaposi's sarcoma, unspecified: Secondary | ICD-10-CM

## 2021-02-25 DIAGNOSIS — Z95828 Presence of other vascular implants and grafts: Secondary | ICD-10-CM

## 2021-02-25 LAB — CBC WITH DIFFERENTIAL (CANCER CENTER ONLY)
Abs Immature Granulocytes: 0.01 10*3/uL (ref 0.00–0.07)
Basophils Absolute: 0 10*3/uL (ref 0.0–0.1)
Basophils Relative: 1 %
Eosinophils Absolute: 0.2 10*3/uL (ref 0.0–0.5)
Eosinophils Relative: 3 %
HCT: 41.3 % (ref 39.0–52.0)
Hemoglobin: 14.3 g/dL (ref 13.0–17.0)
Immature Granulocytes: 0 %
Lymphocytes Relative: 24 %
Lymphs Abs: 1.3 10*3/uL (ref 0.7–4.0)
MCH: 28.9 pg (ref 26.0–34.0)
MCHC: 34.6 g/dL (ref 30.0–36.0)
MCV: 83.4 fL (ref 80.0–100.0)
Monocytes Absolute: 0.7 10*3/uL (ref 0.1–1.0)
Monocytes Relative: 12 %
Neutro Abs: 3.4 10*3/uL (ref 1.7–7.7)
Neutrophils Relative %: 60 %
Platelet Count: 180 10*3/uL (ref 150–400)
RBC: 4.95 MIL/uL (ref 4.22–5.81)
RDW: 13.6 % (ref 11.5–15.5)
WBC Count: 5.6 10*3/uL (ref 4.0–10.5)
nRBC: 0 % (ref 0.0–0.2)

## 2021-02-25 LAB — CMP (CANCER CENTER ONLY)
ALT: 46 U/L — ABNORMAL HIGH (ref 0–44)
AST: 40 U/L (ref 15–41)
Albumin: 4.1 g/dL (ref 3.5–5.0)
Alkaline Phosphatase: 26 U/L — ABNORMAL LOW (ref 38–126)
Anion gap: 8 (ref 5–15)
BUN: 32 mg/dL — ABNORMAL HIGH (ref 8–23)
CO2: 25 mmol/L (ref 22–32)
Calcium: 9.2 mg/dL (ref 8.9–10.3)
Chloride: 108 mmol/L (ref 98–111)
Creatinine: 1.87 mg/dL — ABNORMAL HIGH (ref 0.61–1.24)
GFR, Estimated: 39 mL/min — ABNORMAL LOW (ref >60)
Glucose, Bld: 172 mg/dL — ABNORMAL HIGH (ref 70–99)
Potassium: 4.2 mmol/L (ref 3.5–5.1)
Sodium: 141 mmol/L (ref 135–145)
Total Bilirubin: 0.6 mg/dL (ref 0.3–1.2)
Total Protein: 6.5 g/dL (ref 6.5–8.1)

## 2021-02-25 MED ORDER — SODIUM CHLORIDE 0.9% FLUSH
10.0000 mL | INTRAVENOUS | Status: DC | PRN
  Administered 2021-02-25: 10 mL
  Filled 2021-02-25: qty 10

## 2021-02-25 MED ORDER — DOXORUBICIN HCL LIPOSOMAL CHEMO INJECTION 2 MG/ML
27.0000 mg/m2 | Freq: Once | INTRAVENOUS | Status: AC
  Administered 2021-02-25: 60 mg via INTRAVENOUS
  Filled 2021-02-25: qty 30

## 2021-02-25 MED ORDER — HEPARIN SOD (PORK) LOCK FLUSH 100 UNIT/ML IV SOLN
500.0000 [IU] | Freq: Once | INTRAVENOUS | Status: AC | PRN
  Administered 2021-02-25: 500 [IU]
  Filled 2021-02-25: qty 5

## 2021-02-25 MED ORDER — DEXTROSE 5 % IV SOLN
Freq: Once | INTRAVENOUS | Status: AC
  Filled 2021-02-25: qty 250

## 2021-02-25 MED ORDER — SODIUM CHLORIDE 0.9% FLUSH
10.0000 mL | Freq: Once | INTRAVENOUS | Status: AC
  Administered 2021-02-25: 10 mL
  Filled 2021-02-25: qty 10

## 2021-02-25 MED ORDER — SODIUM CHLORIDE 0.9 % IV SOLN
10.0000 mg | Freq: Once | INTRAVENOUS | Status: AC
  Administered 2021-02-25: 10 mg via INTRAVENOUS
  Filled 2021-02-25: qty 10

## 2021-02-25 NOTE — Progress Notes (Signed)
Provider aware of elevated creatinine of 1.87, okay to proceed with tx as scheduled

## 2021-02-25 NOTE — Patient Instructions (Signed)
[facility]  Discharge Instructions: ?Thank you for choosing [facility] to provide your oncology and hematology care.  ? ?If you have a lab appointment with the Cancer Center, please go directly to the Cancer Center and check in at the registration area. ?  ?Wear comfortable clothing and clothing appropriate for easy access to any Portacath or PICC line.  ? ?We strive to give you quality time with your provider. You may need to reschedule your appointment if you arrive late (15 or more minutes).  Arriving late affects you and other patients whose appointments are after yours.  Also, if you miss three or more appointments without notifying the office, you may be dismissed from the clinic at the provider?s discretion.    ?  ?For prescription refill requests, have your pharmacy contact our office and allow 72 hours for refills to be completed.   ? ?Today you received the following chemotherapy and/or immunotherapy agents : Doxil    ?  ?To help prevent nausea and vomiting after your treatment, we encourage you to take your nausea medication as directed. ? ?BELOW ARE SYMPTOMS THAT SHOULD BE REPORTED IMMEDIATELY: ?*FEVER GREATER THAN 100.4 F (38 ?C) OR HIGHER ?*CHILLS OR SWEATING ?*NAUSEA AND VOMITING THAT IS NOT CONTROLLED WITH YOUR NAUSEA MEDICATION ?*UNUSUAL SHORTNESS OF BREATH ?*UNUSUAL BRUISING OR BLEEDING ?*URINARY PROBLEMS (pain or burning when urinating, or frequent urination) ?*BOWEL PROBLEMS (unusual diarrhea, constipation, pain near the anus) ?TENDERNESS IN MOUTH AND THROAT WITH OR WITHOUT PRESENCE OF ULCERS (sore throat, sores in mouth, or a toothache) ?UNUSUAL RASH, SWELLING OR PAIN  ?UNUSUAL VAGINAL DISCHARGE OR ITCHING  ? ?Items with * indicate a potential emergency and should be followed up as soon as possible or go to the Emergency Department if any problems should occur. ? ?Please show the CHEMOTHERAPY ALERT CARD or IMMUNOTHERAPY ALERT CARD at check-in to the  Emergency Department and triage nurse. ? ?Should you have questions after your visit or need to cancel or reschedule your appointment, please contact [facility]  Dept: [phone]  and follow the prompts.  Office hours are 8:00 a.m. to 4:30 p.m. Monday - Friday. Please note that voicemails left after 4:00 p.m. may not be returned until the following business day.  We are closed weekends and major holidays. You have access to a nurse at all times for urgent questions. Please call the main number to the clinic Dept: [phone] and follow the prompts. ? ? ?For any non-urgent questions, you may also contact your provider using MyChart. We now offer e-Visits for anyone 18 and older to request care online for non-urgent symptoms. For details visit [email or web address]. ?  ?Also download the MyChart app! Go to the app store, search "MyChart", open the app, select [facility], and log in with your MyChart username and password. ? ?Due to Covid, a mask is required upon entering the hospital/clinic. If you do not have a mask, one will be given to you upon arrival. For doctor visits, patients may have 1 support person aged 18 or older with them. For treatment visits, patients cannot have anyone with them due to current Covid guidelines and our immunocompromised population.  ? ?

## 2021-02-27 ENCOUNTER — Encounter: Payer: Self-pay | Admitting: Oncology

## 2021-02-27 NOTE — Progress Notes (Signed)
Hematology and Oncology Follow Up Visit  Austin [name] [id number] 1956-07-26 65 y.o. 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  He has relapsed disease in February 2022.     Past therapy:  Doxil chemotherapy given at 30 mg/m every 4 weeks the first cycle given at [facility] in June 2016.   He completed 6 cycles of therapy in November 2016. He developed relapsed disease in September 2019. Doxil 30 mg/m every 4 weeks restarted in September 2019.  He completed 6 months of therapy.  He developed relapsed disease February 2022 with increased skin lesions on his lower extremities and drainage.  Current therapy: Doxil 60 mg total dose every 4 weeks restarted on September 13, 2020.  He is here for cycle 7 of therapy.  Interim History:  Austin [name] for a follow-up visit.  He reports mild fatigue but continues to complete all his ADLs on his own. He has a good appetite without any weight changes. He denies any nausea, vomiting or abdominal pain. His bowel movements are regular without any diarrhea or constipation. Patient has stable lower extremity edema.He denies any worsening neuropathy or dermatological toxicities.  He denies any fevers, chills, night sweats, shortness of breath, chest pain or cough. He has no other complaints.   Medications: Updated on review Current Outpatient Medications  Medication Sig Dispense Refill   fenofibrate 160 MG tablet Take 1 tablet (160 mg total) by mouth daily. 90 tablet 3   ferrous sulfate 325 (65 FE) MG tablet Take 1 tablet (325 mg total) by mouth daily with breakfast. 30 tablet 3   furosemide (LASIX) 20 MG tablet Take 1 tablet daily x 3 days, then stop. And repeat if swelling recurs. 30 tablet 5   lidocaine-prilocaine (EMLA) cream Apply 1 application topically as needed. 30  g 0   losartan (COZAAR) 25 MG tablet Take 1 tablet (25 mg total) by mouth daily. 90 tablet 3   metoprolol (TOPROL-XL) 200 MG 24 hr tablet TAKE 1 TABLET DAILY 90 tablet 3   Multiple Vitamin (MULTIVITAMIN) tablet Take 1 tablet by mouth daily.     rosuvastatin (CRESTOR) 40 MG tablet TAKE 1 TABLET DAILY 90 tablet 3   warfarin (COUMADIN) 5 MG tablet TAKE ONE TABLET DAILY EXCEPT TAKE ONE AND ONE-HALF TABLETS ON WEDNESDAYS OR TAKE AS DIRECTED BY ANTICOAGULATION CLINIC 120 tablet 3   No current facility-administered medications for this visit.     Allergies:  Allergies  Allergen Reactions   Celecoxib Itching and Other (See Comments)    Joint pain       Physical Exam: Blood pressure 126/74, pulse (!) 57, temperature (!) 97.4 F (36.3 C), temperature source Tympanic, resp. rate 20, weight 220 lb 11.2 oz (100.1 kg), SpO2 99 %. ECOG: 0 General appearance: Comfortable appearing without any discomfort Head: Normocephalic without any trauma Oropharynx: Mucous membranes are moist and pink without any thrush or ulcers. Eyes: Pupils are equal and round reactive to light. Lymph nodes: No cervical, supraclavicular, inguinal or axillary lymphadenopathy.   Heart:regular rate and rhythm.  S1 and S2 without leg edema. Lung: Clear without any rhonchi or wheezes.  No dullness to percussion. Abdomin: Soft, nontender, nondistended with good bowel sounds.  No hepatosplenomegaly. Musculoskeletal: No joint deformity or effusion.  Full range of motion noted. Neurological: No deficits noted on motor, sensory and deep  tendon reflex exam. Skin: No petechial rash or dryness.  Appeared moist.   Lab Results: Lab Results  Component Value Date   WBC 5.6 02/25/2021   HGB 14.3 02/25/2021   HCT 41.3 02/25/2021   MCV 83.4 02/25/2021   PLT 180 02/25/2021     Chemistry      Component Value Date/Time   NA 141 02/25/2021 1337   NA 142 05/26/2019 1020   NA 143 11/03/2016 0908   K 4.2 02/25/2021 1337   K 5.0  11/03/2016 0908   CL 108 02/25/2021 1337   CO2 25 02/25/2021 1337   CO2 27 11/03/2016 0908   BUN 32 (H) 02/25/2021 1337   BUN 24 05/26/2019 1020   BUN 33.5 (H) 11/03/2016 0908   CREATININE 1.87 (H) 02/25/2021 1337   CREATININE 1.5 (H) 11/03/2016 0908      Component Value Date/Time   CALCIUM 9.2 02/25/2021 1337   CALCIUM 9.5 11/03/2016 0908   ALKPHOS 26 (L) 02/25/2021 1337   ALKPHOS 25 (L) 11/03/2016 0908   AST 40 02/25/2021 1337   AST 29 11/03/2016 0908   ALT 46 (H) 02/25/2021 1337   ALT 29 11/03/2016 0908   BILITOT 0.6 02/25/2021 1337   BILITOT 0.70 11/03/2016 0908      Impression and Plan:   65 year old man with:  1.  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 -Patient returns [name], 02/25/21, for a follow up prior to Cycle 7 of Doxil. Labs from [name] were reviewed without any intervention needed. CBC was unremarkable. CMP revealed improving LFTs and mild increase in creatinine level at 1.87. Patient will proceed with treatment as planned and return in 4 weeks prior to Cycle 8.   2. Cardiomyopathy: No clinical signs or symptoms of heart failure.  Baseline echocardiogram showed normal EF.   3.  IV access: Port-A-Cath remains in use with difficulty and blood return [name].  This is being addressed with tPA currently.   4. Follow-up: In 4 weeks for repeat evaluation and cycle 8.  Patient expressed understanding and satisfaction with the plan provided.   I have spent a total of 30 minutes minutes of face-to-face and non-face-to-face time, preparing to see the patient, obtaining and/or reviewing separately obtained history, performing a medically appropriate examination, counseling and educating the patient,  documenting clinical information in the electronic health record, and care coordination.   Lincoln Brigham, PA-C Hematology and [place] at [facility]

## 2021-03-17 IMAGING — US IR IMAGING GUIDED PORT INSERTION
1 series · 2 of 2 positions shown · non-contrast
Comparison: None.

INDICATION: 64-year-old with Kaposi sarcoma and undergoing treatment.
Port-A-Cath needed due to poor venous access.

EXAM:
FLUOROSCOPIC AND ULTRASOUND GUIDED PLACEMENT OF A SUBCUTANEOUS PORT

[Series 1: (id) · 2 of 2 slices shown]
[im 1/2]
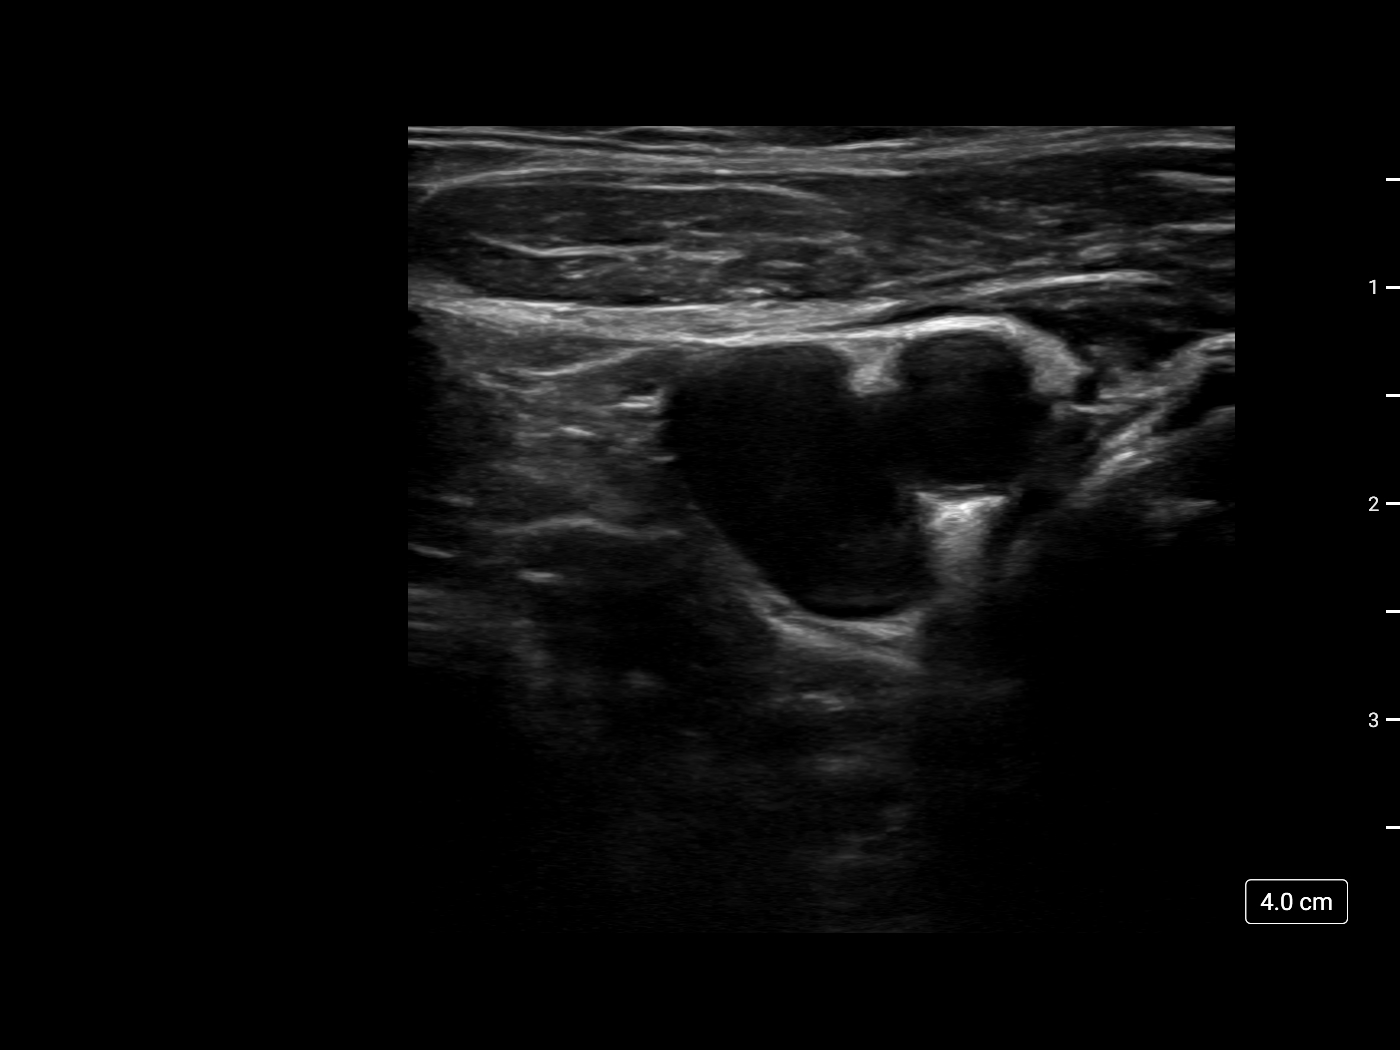
[im 2/2]
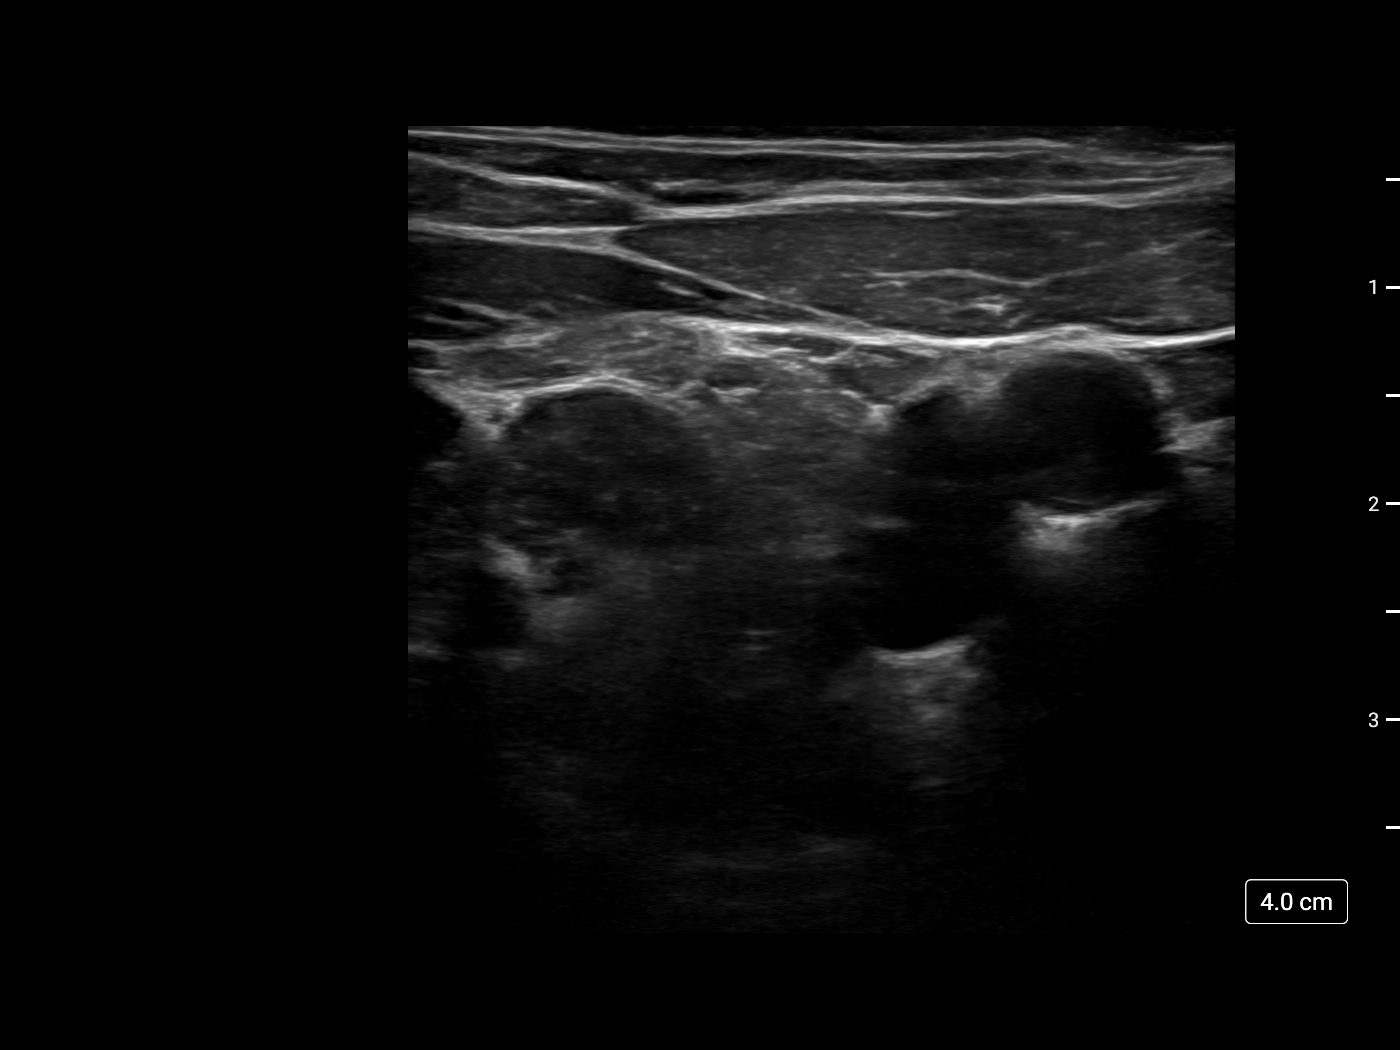

[2 of 2 positions shown; findings below may reference images not displayed]

MEDICATIONS:
Ancef 2 g; The antibiotic was administered within an appropriate
time interval prior to skin puncture.

ANESTHESIA/SEDATION:
Versed 4.0 mg IV; Fentanyl 50 mcg IV;

Moderate Sedation Time:  34 minutes

The patient was continuously monitored during the procedure by the
interventional radiology nurse under my direct supervision.

FLUOROSCOPY TIME:  36 seconds, 9 mGy

COMPLICATIONS:
None immediate.

PROCEDURE:
The procedure, risks, benefits, and alternatives were explained to
the patient. Questions regarding the procedure were encouraged and
answered. The patient understands and consents to the procedure.

Patient was placed supine on the interventional table. Ultrasound
confirmed a patent right internal jugular vein. Ultrasound image was
saved for documentation. The right chest and neck were cleaned with
a skin antiseptic and a sterile drape was placed. Maximal barrier
sterile technique was utilized including caps, mask, sterile gowns,
sterile gloves, sterile drape, hand hygiene and skin antiseptic. The
right neck was anesthetized with 1% lidocaine. Small incision was
made in the right neck with a blade. Micropuncture set was placed in
the right internal jugular vein with ultrasound guidance. The
micropuncture wire was used for measurement purposes. The right
chest was anesthetized with 1% lidocaine with epinephrine. #15 blade
was used to make an incision and a subcutaneous port pocket was
formed. 8 french Power Port was assembled. Subcutaneous tunnel was
formed with a stiff tunneling device. The port catheter was brought
through the subcutaneous tunnel. The port was placed in the
subcutaneous pocket and sutured in place. The micropuncture set was
exchanged for a peel-away sheath. The catheter was placed through
the peel-away sheath and the tip was positioned at the superior
cavoatrial junction. Catheter placement was confirmed with
fluoroscopy. The port was accessed and flushed with heparinized
saline. The port pocket was closed using two layers of absorbable
sutures and Dermabond. The vein skin site was closed using a single
layer of absorbable suture and Dermabond. Sterile dressings were
applied. Patient tolerated the procedure well without an immediate
complication. Ultrasound and fluoroscopic images were taken and
saved for this procedure.
IMPRESSION: Placement of a subcutaneous port device. Catheter tip at the
superior cavoatrial junction.

## 2021-03-24 ENCOUNTER — Other Ambulatory Visit: Payer: Self-pay

## 2021-03-24 ENCOUNTER — Ambulatory Visit (INDEPENDENT_AMBULATORY_CARE_PROVIDER_SITE_OTHER): Payer: BC Managed Care – PPO

## 2021-03-24 DIAGNOSIS — Z7901 Long term (current) use of anticoagulants: Secondary | ICD-10-CM | POA: Diagnosis not present

## 2021-03-24 DIAGNOSIS — N1832 Chronic kidney disease, stage 3b: Secondary | ICD-10-CM | POA: Diagnosis not present

## 2021-03-24 LAB — POCT INR: INR: 1.7 — AB (ref 2.0–3.0)

## 2021-03-24 MED FILL — Dexamethasone Sodium Phosphate Inj 100 MG/10ML: INTRAMUSCULAR | Qty: 1 | Status: AC

## 2021-03-24 NOTE — Patient Instructions (Addendum)
Pre visit review using our clinic review tool, if applicable. No additional management support is needed unless otherwise documented below in the visit note.  Increase dose today to '10mg'$  and then continue to take 1 tablet daily except take 1/2 tablet on Tuesdays and Fridays.  Re-check in 6 weeks.

## 2021-03-24 NOTE — Progress Notes (Signed)
Medical screening examination/treatment/procedure(s) were performed by non-physician practitioner and as supervising physician I was immediately available for consultation/collaboration. I agree with above. Shernell Saldierna, MD   

## 2021-03-25 ENCOUNTER — Inpatient Hospital Stay: Payer: BC Managed Care – PPO | Attending: Oncology

## 2021-03-25 ENCOUNTER — Inpatient Hospital Stay: Payer: BC Managed Care – PPO

## 2021-03-25 ENCOUNTER — Inpatient Hospital Stay (HOSPITAL_BASED_OUTPATIENT_CLINIC_OR_DEPARTMENT_OTHER): Payer: BC Managed Care – PPO | Admitting: Oncology

## 2021-03-25 VITALS — BP 132/76 | HR 64 | Temp 98.5°F | Resp 17 | Ht 68.0 in | Wt 219.9 lb

## 2021-03-25 DIAGNOSIS — Z95828 Presence of other vascular implants and grafts: Secondary | ICD-10-CM

## 2021-03-25 DIAGNOSIS — C469 Kaposi's sarcoma, unspecified: Secondary | ICD-10-CM

## 2021-03-25 DIAGNOSIS — R972 Elevated prostate specific antigen [PSA]: Secondary | ICD-10-CM

## 2021-03-25 DIAGNOSIS — I429 Cardiomyopathy, unspecified: Secondary | ICD-10-CM | POA: Diagnosis not present

## 2021-03-25 DIAGNOSIS — Z7901 Long term (current) use of anticoagulants: Secondary | ICD-10-CM | POA: Insufficient documentation

## 2021-03-25 DIAGNOSIS — C460 Kaposi's sarcoma of skin: Secondary | ICD-10-CM | POA: Diagnosis not present

## 2021-03-25 DIAGNOSIS — Z5111 Encounter for antineoplastic chemotherapy: Secondary | ICD-10-CM | POA: Insufficient documentation

## 2021-03-25 DIAGNOSIS — Z79899 Other long term (current) drug therapy: Secondary | ICD-10-CM | POA: Insufficient documentation

## 2021-03-25 LAB — CMP (CANCER CENTER ONLY)
ALT: 56 U/L — ABNORMAL HIGH (ref 0–44)
AST: 54 U/L — ABNORMAL HIGH (ref 15–41)
Albumin: 4.4 g/dL (ref 3.5–5.0)
Alkaline Phosphatase: 31 U/L — ABNORMAL LOW (ref 38–126)
Anion gap: 9 (ref 5–15)
BUN: 27 mg/dL — ABNORMAL HIGH (ref 8–23)
CO2: 23 mmol/L (ref 22–32)
Calcium: 9.5 mg/dL (ref 8.9–10.3)
Chloride: 110 mmol/L (ref 98–111)
Creatinine: 1.79 mg/dL — ABNORMAL HIGH (ref 0.61–1.24)
GFR, Estimated: 42 mL/min — ABNORMAL LOW (ref >60)
Glucose, Bld: 105 mg/dL — ABNORMAL HIGH (ref 70–99)
Potassium: 4.4 mmol/L (ref 3.5–5.1)
Sodium: 142 mmol/L (ref 135–145)
Total Bilirubin: 0.6 mg/dL (ref 0.3–1.2)
Total Protein: 7.1 g/dL (ref 6.5–8.1)

## 2021-03-25 LAB — CBC WITH DIFFERENTIAL (CANCER CENTER ONLY)
Abs Immature Granulocytes: 0.01 10*3/uL (ref 0.00–0.07)
Basophils Absolute: 0 10*3/uL (ref 0.0–0.1)
Basophils Relative: 0 %
Eosinophils Absolute: 0.2 10*3/uL (ref 0.0–0.5)
Eosinophils Relative: 3 %
HCT: 41.5 % (ref 39.0–52.0)
Hemoglobin: 14.2 g/dL (ref 13.0–17.0)
Immature Granulocytes: 0 %
Lymphocytes Relative: 29 %
Lymphs Abs: 1.6 10*3/uL (ref 0.7–4.0)
MCH: 29 pg (ref 26.0–34.0)
MCHC: 34.2 g/dL (ref 30.0–36.0)
MCV: 84.7 fL (ref 80.0–100.0)
Monocytes Absolute: 0.7 10*3/uL (ref 0.1–1.0)
Monocytes Relative: 13 %
Neutro Abs: 3 10*3/uL (ref 1.7–7.7)
Neutrophils Relative %: 55 %
Platelet Count: 189 10*3/uL (ref 150–400)
RBC: 4.9 MIL/uL (ref 4.22–5.81)
RDW: 13.7 % (ref 11.5–15.5)
WBC Count: 5.5 10*3/uL (ref 4.0–10.5)
nRBC: 0.4 % — ABNORMAL HIGH (ref 0.0–0.2)

## 2021-03-25 MED ORDER — SODIUM CHLORIDE 0.9% FLUSH
10.0000 mL | INTRAVENOUS | Status: DC | PRN
  Administered 2021-03-25: 10 mL

## 2021-03-25 MED ORDER — HEPARIN SOD (PORK) LOCK FLUSH 100 UNIT/ML IV SOLN
500.0000 [IU] | Freq: Once | INTRAVENOUS | Status: AC | PRN
  Administered 2021-03-25: 500 [IU]

## 2021-03-25 MED ORDER — SODIUM CHLORIDE 0.9% FLUSH
10.0000 mL | Freq: Once | INTRAVENOUS | Status: AC
  Administered 2021-03-25: 10 mL

## 2021-03-25 MED ORDER — DOXORUBICIN HCL LIPOSOMAL CHEMO INJECTION 2 MG/ML
27.0000 mg/m2 | Freq: Once | INTRAVENOUS | Status: AC
  Administered 2021-03-25: 60 mg via INTRAVENOUS
  Filled 2021-03-25: qty 30

## 2021-03-25 MED ORDER — DEXTROSE 5 % IV SOLN: Freq: Once | INTRAVENOUS | Status: AC

## 2021-03-25 MED ORDER — SODIUM CHLORIDE 0.9 % IV SOLN
10.0000 mg | Freq: Once | INTRAVENOUS | Status: AC
  Administered 2021-03-25: 10 mg via INTRAVENOUS
  Filled 2021-03-25: qty 10

## 2021-03-25 NOTE — Patient Instructions (Signed)
[facility]  Discharge Instructions: ?Thank you for choosing [facility] to provide your oncology and hematology care.  ? ?If you have a lab appointment with the Cancer Center, please go directly to the Cancer Center and check in at the registration area. ?  ?Wear comfortable clothing and clothing appropriate for easy access to any Portacath or PICC line.  ? ?We strive to give you quality time with your provider. You may need to reschedule your appointment if you arrive late (15 or more minutes).  Arriving late affects you and other patients whose appointments are after yours.  Also, if you miss three or more appointments without notifying the office, you may be dismissed from the clinic at the provider?s discretion.    ?  ?For prescription refill requests, have your pharmacy contact our office and allow 72 hours for refills to be completed.   ? ?Today you received the following chemotherapy and/or immunotherapy agents : Doxil    ?  ?To help prevent nausea and vomiting after your treatment, we encourage you to take your nausea medication as directed. ? ?BELOW ARE SYMPTOMS THAT SHOULD BE REPORTED IMMEDIATELY: ?*FEVER GREATER THAN 100.4 F (38 ?C) OR HIGHER ?*CHILLS OR SWEATING ?*NAUSEA AND VOMITING THAT IS NOT CONTROLLED WITH YOUR NAUSEA MEDICATION ?*UNUSUAL SHORTNESS OF BREATH ?*UNUSUAL BRUISING OR BLEEDING ?*URINARY PROBLEMS (pain or burning when urinating, or frequent urination) ?*BOWEL PROBLEMS (unusual diarrhea, constipation, pain near the anus) ?TENDERNESS IN MOUTH AND THROAT WITH OR WITHOUT PRESENCE OF ULCERS (sore throat, sores in mouth, or a toothache) ?UNUSUAL RASH, SWELLING OR PAIN  ?UNUSUAL VAGINAL DISCHARGE OR ITCHING  ? ?Items with * indicate a potential emergency and should be followed up as soon as possible or go to the Emergency Department if any problems should occur. ? ?Please show the CHEMOTHERAPY ALERT CARD or IMMUNOTHERAPY ALERT CARD at check-in to the  Emergency Department and triage nurse. ? ?Should you have questions after your visit or need to cancel or reschedule your appointment, please contact [facility]  Dept: [phone]  and follow the prompts.  Office hours are 8:00 a.m. to 4:30 p.m. Monday - Friday. Please note that voicemails left after 4:00 p.m. may not be returned until the following business day.  We are closed weekends and major holidays. You have access to a nurse at all times for urgent questions. Please call the main number to the clinic Dept: [phone] and follow the prompts. ? ? ?For any non-urgent questions, you may also contact your provider using MyChart. We now offer e-Visits for anyone 18 and older to request care online for non-urgent symptoms. For details visit [email or web address]. ?  ?Also download the MyChart app! Go to the app store, search "MyChart", open the app, select , and log in with your MyChart username and password. ? ?Due to Covid, a mask is required upon entering the hospital/clinic. If you do not have a mask, one will be given to you upon arrival. For doctor visits, patients may have 1 support person aged 18 or older with them. For treatment visits, patients cannot have anyone with them due to current Covid guidelines and our immunocompromised population.  ? ?

## 2021-03-25 NOTE — Progress Notes (Signed)
OK to treat with elevated creatinine of 1.79 per Dr. Alen Blew.

## 2021-03-25 NOTE — Progress Notes (Signed)
Hematology and Oncology Follow Up Visit  Austin [name] [id number] 1955/12/27 65 y.o. 03/25/2021 12:28 PM Austin [name], MDJohn, [name], MD   Principle Diagnosis: 65 year old man with Kaposi's sarcoma involving lower extremities and trunk diagnosed in 2016.  He presented with extensive disease with relapse in 2022.   Past therapy:  Doxil chemotherapy given at 30 mg/m every 4 weeks the first cycle given at [facility] in June 2016.   He completed 6 cycles of therapy in November 2016. He developed relapsed disease in September 2019. Doxil 30 mg/m every 4 weeks restarted in September 2019.  He completed 6 months of therapy.  He developed relapsed disease February 2022 with increased skin lesions on his lower extremities and drainage.  Current therapy: Doxil 60 mg total dose every 4 weeks restarted on September 13, 2020.  He is here for cycle 8 of therapy.  Interim History:  [name] returns today for repeat evaluation.  Since the last visit, he reports no major changes in his health.  He continues to tolerate Doxil without any recent complaints.  He denies any recent skin rashes or lesions or irritations.  He denies any shortness of breath or difficulty breathing.  He denies any worsening neuropathy.       Medications: Unchanged on review. Current Outpatient Medications  Medication Sig Dispense Refill   fenofibrate 160 MG tablet Take 1 tablet (160 mg total) by mouth daily. 90 tablet 3   ferrous sulfate 325 (65 FE) MG tablet Take 1 tablet (325 mg total) by mouth daily with breakfast. 30 tablet 3   furosemide (LASIX) 20 MG tablet Take 1 tablet daily x 3 days, then stop. And repeat if swelling recurs. 30 tablet 5   lidocaine-prilocaine (EMLA) cream Apply 1 application topically as needed. 30 g 0   losartan (COZAAR) 25 MG tablet Take 1 tablet (25 mg total) by mouth daily. 90 tablet 3   metoprolol (TOPROL-XL) 200 MG 24 hr tablet TAKE 1 TABLET DAILY 90 tablet 3    Multiple Vitamin (MULTIVITAMIN) tablet Take 1 tablet by mouth daily.     rosuvastatin (CRESTOR) 40 MG tablet TAKE 1 TABLET DAILY 90 tablet 3   warfarin (COUMADIN) 5 MG tablet TAKE ONE TABLET DAILY EXCEPT TAKE ONE AND ONE-HALF TABLETS ON WEDNESDAYS OR TAKE AS DIRECTED BY ANTICOAGULATION CLINIC 120 tablet 3   No current facility-administered medications for this visit.   Facility-Administered Medications Ordered in Other Visits  Medication Dose Route Frequency Provider Last Rate Last Admin   sodium chloride flush (NS) 0.9 % injection 10 mL  10 mL Intracatheter Once Wyatt Portela, MD         Allergies:  Allergies  Allergen Reactions   Celecoxib Itching and Other (See Comments)    Joint pain       Physical Exam:         Blood pressure 132/76, pulse 64, temperature 98.5 F (36.9 C), temperature source Oral, resp. rate 17, height '5\' 8"'$  (1.727 m), weight 219 lb 14.4 oz (99.7 kg), SpO2 98 %.        ECOG: 0     General appearance: Alert, awake without any distress. Head: Atraumatic without abnormalities Oropharynx: Without any thrush or ulcers. Eyes: No scleral icterus. Lymph nodes: No lymphadenopathy noted in the cervical, supraclavicular, or axillary nodes Heart:regular rate and rhythm, without any murmurs or gallops.   Lung: Clear to auscultation without any rhonchi, wheezes or dullness to percussion. Abdomin: Soft, nontender without  any shifting dullness or ascites. Musculoskeletal: No clubbing or cyanosis. Neurological: No motor or sensory deficits. Skin: No rashes or lesions.            Lab Results: Lab Results  Component Value Date   WBC 5.6 02/25/2021   HGB 14.3 02/25/2021   HCT 41.3 02/25/2021   MCV 83.4 02/25/2021   PLT 180 02/25/2021     Chemistry      Component Value Date/Time   NA 141 02/25/2021 1337   NA 142 05/26/2019 1020   NA 143 11/03/2016 0908   K 4.2 02/25/2021 1337   K 5.0 11/03/2016 0908   CL 108 02/25/2021 1337   CO2  25 02/25/2021 1337   CO2 27 11/03/2016 0908   BUN 32 (H) 02/25/2021 1337   BUN 24 05/26/2019 1020   BUN 33.5 (H) 11/03/2016 0908   CREATININE 1.87 (H) 02/25/2021 1337   CREATININE 1.5 (H) 11/03/2016 0908      Component Value Date/Time   CALCIUM 9.2 02/25/2021 1337   CALCIUM 9.5 11/03/2016 0908   ALKPHOS 26 (L) 02/25/2021 1337   ALKPHOS 25 (L) 11/03/2016 0908   AST 40 02/25/2021 1337   AST 29 11/03/2016 0908   ALT 46 (H) 02/25/2021 1337   ALT 29 11/03/2016 0908   BILITOT 0.6 02/25/2021 1337   BILITOT 0.70 11/03/2016 0908      Impression and Plan:   65 year old man with:  1.  Kaposi's sarcoma involving the lower extremities and trunk diagnosed in 2016.  He developed relapsed disease in 2022.   He is currently on Doxil therapy without any major complications and excellent response.  Risks and benefits of continuing this treatment were reviewed at this time.  Potential complications that include nausea, fatigue, myelosuppression and dermatological toxicity patient is were updated.  Duration of treatment were discussed at this time and I recommended continuing treatment for the time being.  After discussion today, we opted to resume maintenance therapy with infusion every 3 months for the next year and consideration for longer duration after that.   2. Cardiomyopathy: No exacerbation noted on Doxil.  Baseline cardiac function is normal.   3.  IV access: Port-A-Cath currently in use without any issues.   4. Follow-up: He will return in 3 months for the next Doxil infusion.  30  minutes were spent on this visit.  The time was dedicated to reviewing laboratory data, disease status update, treatment choices and future plan of care review. Zola Button, MD 8/26/202212:28 PM

## 2021-03-29 DIAGNOSIS — N1832 Chronic kidney disease, stage 3b: Secondary | ICD-10-CM | POA: Diagnosis not present

## 2021-03-29 DIAGNOSIS — D649 Anemia, unspecified: Secondary | ICD-10-CM | POA: Diagnosis not present

## 2021-03-29 DIAGNOSIS — I10 Essential (primary) hypertension: Secondary | ICD-10-CM | POA: Diagnosis not present

## 2021-05-05 ENCOUNTER — Other Ambulatory Visit: Payer: Self-pay

## 2021-05-05 ENCOUNTER — Ambulatory Visit (INDEPENDENT_AMBULATORY_CARE_PROVIDER_SITE_OTHER): Payer: BC Managed Care – PPO

## 2021-05-05 DIAGNOSIS — Z7901 Long term (current) use of anticoagulants: Secondary | ICD-10-CM | POA: Diagnosis not present

## 2021-05-05 LAB — POCT INR: INR: 2.1 (ref 2.0–3.0)

## 2021-05-05 NOTE — Patient Instructions (Addendum)
Pre visit review using our clinic review tool, if applicable. No additional management support is needed unless otherwise documented below in the visit note.  Continue to take 1 tablet daily except take 1/2 tablet on Tuesdays and Fridays.  Re-check in 6 weeks.

## 2021-05-05 NOTE — Progress Notes (Signed)
Medical screening examination/treatment/procedure(s) were performed by non-physician practitioner and as supervising physician I was immediately available for consultation/collaboration. I agree with above. Merri Dimaano, MD   

## 2021-05-06 DIAGNOSIS — H9042 Sensorineural hearing loss, unilateral, left ear, with unrestricted hearing on the contralateral side: Secondary | ICD-10-CM | POA: Diagnosis not present

## 2021-05-06 DIAGNOSIS — Z45321 Encounter for adjustment and management of cochlear device: Secondary | ICD-10-CM | POA: Diagnosis not present

## 2021-05-18 DIAGNOSIS — H524 Presbyopia: Secondary | ICD-10-CM | POA: Diagnosis not present

## 2021-05-18 DIAGNOSIS — H43822 Vitreomacular adhesion, left eye: Secondary | ICD-10-CM | POA: Diagnosis not present

## 2021-05-27 DIAGNOSIS — H35371 Puckering of macula, right eye: Secondary | ICD-10-CM | POA: Diagnosis not present

## 2021-05-27 DIAGNOSIS — H43822 Vitreomacular adhesion, left eye: Secondary | ICD-10-CM | POA: Diagnosis not present

## 2021-05-27 DIAGNOSIS — H43813 Vitreous degeneration, bilateral: Secondary | ICD-10-CM | POA: Diagnosis not present

## 2021-05-27 DIAGNOSIS — H35033 Hypertensive retinopathy, bilateral: Secondary | ICD-10-CM | POA: Diagnosis not present

## 2021-06-16 ENCOUNTER — Ambulatory Visit (INDEPENDENT_AMBULATORY_CARE_PROVIDER_SITE_OTHER): Payer: BC Managed Care – PPO

## 2021-06-16 ENCOUNTER — Other Ambulatory Visit: Payer: Self-pay

## 2021-06-16 DIAGNOSIS — Z7901 Long term (current) use of anticoagulants: Secondary | ICD-10-CM

## 2021-06-16 LAB — POCT INR: INR: 2.7 (ref 2.0–3.0)

## 2021-06-16 NOTE — Progress Notes (Signed)
Continue to take 1 tablet daily except take 1/2 tablet on Tuesdays and Fridays.  Re-check in 7 weeks

## 2021-06-16 NOTE — Progress Notes (Signed)
Patient ID: Austin [name], male   DOB: 02-17-1956, 65 y.o.   MRN: [id number]  Medical screening examination/treatment/procedure(s) were performed by non-physician practitioner and as supervising physician I was immediately available for consultation/collaboration.  I agree with above. Cathlean Cower, MD

## 2021-06-16 NOTE — Patient Instructions (Addendum)
Pre visit review using our clinic review tool, if applicable. No additional management support is needed unless otherwise documented below in the visit note.  Continue to take 1 tablet daily except take 1/2 tablet on Tuesdays and Fridays.  Re-check in 7 weeks

## 2021-06-17 ENCOUNTER — Inpatient Hospital Stay: Payer: BC Managed Care – PPO | Admitting: Oncology

## 2021-06-17 ENCOUNTER — Inpatient Hospital Stay: Payer: BC Managed Care – PPO

## 2021-07-08 ENCOUNTER — Ambulatory Visit (INDEPENDENT_AMBULATORY_CARE_PROVIDER_SITE_OTHER): Payer: BC Managed Care – PPO | Admitting: Cardiovascular Disease

## 2021-07-08 ENCOUNTER — Encounter: Payer: Self-pay | Admitting: Cardiovascular Disease

## 2021-07-08 ENCOUNTER — Other Ambulatory Visit: Payer: Self-pay

## 2021-07-08 DIAGNOSIS — I10 Essential (primary) hypertension: Secondary | ICD-10-CM | POA: Diagnosis not present

## 2021-07-08 DIAGNOSIS — I359 Nonrheumatic aortic valve disorder, unspecified: Secondary | ICD-10-CM

## 2021-07-08 DIAGNOSIS — I712 Thoracic aortic aneurysm, without rupture, unspecified: Secondary | ICD-10-CM

## 2021-07-08 LAB — BASIC METABOLIC PANEL
BUN/Creatinine Ratio: 19 (ref 10–24)
BUN: 31 mg/dL — ABNORMAL HIGH (ref 8–27)
CO2: 25 mmol/L (ref 20–29)
Calcium: 9.6 mg/dL (ref 8.6–10.2)
Chloride: 104 mmol/L (ref 96–106)
Creatinine, Ser: 1.67 mg/dL — ABNORMAL HIGH (ref 0.76–1.27)
Glucose: 136 mg/dL — ABNORMAL HIGH (ref 70–99)
Potassium: 4.6 mmol/L (ref 3.5–5.2)
Sodium: 141 mmol/L (ref 134–144)
eGFR: 45 mL/min/{1.73_m2} — ABNORMAL LOW (ref >59)

## 2021-07-08 NOTE — Progress Notes (Signed)
Chief Complaint  Patient presents with   Follow-up    Aortic dissection    History of Present Illness: 65 yo male with history of HTN, HLD, ascending aortic aneursym/dissection with repair at [facility] in 2003 with St. 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 His surgery was at [facility] in 2003. He has had a residual thoracic and abdominal aortic dissection since then. Imaging showed large dissection extending from ascending aorta down into the abdominal aorta and iliacs. The false lumen in the descending thoracic aortic aneurysm is larger than the true lumen. This has been stable for years and CT surgery does not think surgical correction is indicated. There is some involvement of the arch vessels and renal arteries. He was also found to have a right lung nodule and lesions in the liver and kidneys that were hard to characterize. Echo May 2016 at [facility] with normal LV function, normally functioning AVR. He is on chronic coumadin for anticoagulation after mechanical AVR.  CTA chest and abdomen May 2016 at [facility] and stable. 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 He has been seen in [facility], [facility] and is followed in [place]. He is HIV negative. He has completed chemotherapy.  CTA chest November 2020 with stable chronic aortic dissection. Echo February 2022 with LVEF=50-55%, normally functioning mechanical AVR.   He is here today for follow up. The patient denies any chest pain, dyspnea, palpitations, lower extremity edema, orthopnea, PND, dizziness, near syncope or syncope.   Primary Care Physician: [name], MD  Past Medical History:  Diagnosis Date   Anemia    iron def   Aortic valve prosthesis present    a. SJM - [facility] 2003, chronic coumadin.   Back pain    CKD (chronic kidney disease), stage II    Coronary artery disease    not sure where this dx came from - pt denies   Family  history of melanoma    Hearing loss    Hyperlipidemia    Hypertension    Internal hemorrhoid    Kaposi sarcoma ([place])    Obesity    Pericardial effusion    Thoracic aortic aneurysm    a. s/p dissection and repair @ [facility] 2003 with SJM AVR with residual findings followed by CT.   Thyroid disease     Past Surgical History:  Procedure Laterality Date   ABDOMINAL AORTIC ANEURYSM REPAIR     AORTIC VALVE REPLACEMENT     impingment     right shoulder   IR IMAGING GUIDED PORT INSERTION  09/17/2020   KNEE ARTHROSCOPY     left   WISDOM TOOTH EXTRACTION      Current Outpatient Medications  Medication Sig Dispense Refill   fenofibrate 160 MG tablet Take 1 tablet (160 mg total) by mouth daily. 90 tablet 3   ferrous sulfate 325 (65 FE) MG tablet Take 1 tablet (325 mg total) by mouth daily with breakfast. 30 tablet 3   furosemide (LASIX) 20 MG tablet Take 1 tablet daily x 3 days, then stop. And repeat if swelling recurs. 30 tablet 5   lidocaine-prilocaine (EMLA) cream Apply 1 application topically as needed. 30 g 0   losartan (COZAAR) 25 MG tablet Take 1 tablet (25 mg total) by mouth daily. 90 tablet 3   metoprolol (TOPROL-XL) 200 MG 24 hr tablet TAKE 1 TABLET DAILY 90 tablet 3   Multiple Vitamin (MULTIVITAMIN) tablet Take  1 tablet by mouth daily.     rosuvastatin (CRESTOR) 40 MG tablet TAKE 1 TABLET DAILY 90 tablet 3   warfarin (COUMADIN) 5 MG tablet TAKE ONE TABLET DAILY EXCEPT TAKE ONE AND ONE-HALF TABLETS ON WEDNESDAYS OR TAKE AS DIRECTED BY ANTICOAGULATION CLINIC 120 tablet 3   No current facility-administered medications for this visit.    Allergies  Allergen Reactions   Celecoxib Itching and Other (See Comments)    Joint pain    Social History   Socioeconomic History   Marital status: Single    Spouse name: Not on file   Number of children: 0   Years of education: MDIV   Highest education level: Not on file  Occupational History   Occupation: Academic librarian: [employer]  Tobacco Use   Smoking status: Former    Packs/day: 1.00    Years: 15.00    Pack years: 15.00    Types: Cigarettes    Quit date: 10/10/2001    Years since quitting: 19.7   Smokeless tobacco: Never  Substance and Sexual Activity   Alcohol use: Yes    Comment: one drink per day   Drug use: No   Sexual activity: Not Currently  Other Topics Concern   Not on file  Social History Narrative   Pt lives at home alone.   Caffeine Use: Less than 1 cup daily.   Social Determinants of Health   Financial Resource Strain: Not on file  Food Insecurity: Not on file  Transportation Needs: Not on file  Physical Activity: Not on file  Stress: Not on file  Social Connections: Not on file  Intimate Partner Violence: Not on file    Family History  Problem Relation Age of Onset   Stroke Mother        late 61s   Stroke Father 25   Liver disease Sister        Sclerosing cholegitis necessitating a liver transplant   Melanoma Maternal Uncle        dx in his 69s   Heart disease Paternal Aunt    Other Maternal Grandfather        died under the age of 45 due to a health related issue   Colon cancer Neg Hx     Review of Systems:  As stated in the HPI and otherwise negative.   BP 126/80   Pulse (!) 56   Ht 5\' 8"  (1.727 m)   Wt 219 lb 9.6 oz (99.6 kg)   SpO2 97%   BMI 33.39 kg/m   Physical Examination:  General: Well developed, well nourished, NAD  HEENT: OP clear, mucus membranes moist  SKIN: warm, dry. No rashes. Neuro: No focal deficits  Musculoskeletal: Muscle strength 5/5 all ext  Psychiatric: Mood and affect normal  Neck: No JVD, no carotid bruits, no thyromegaly, no lymphadenopathy.  Lungs:Clear bilaterally, no wheezes, rhonci, crackles Cardiovascular: Regular rate and rhythm. Valve click. Systolic murmur.  Abdomen:Soft. Bowel sounds present. Non-tender.  Extremities: No lower extremity edema. Pulses are 2 + in the bilateral DP/PT.  EKG:  EKG is not  ordered today. The ekg ordered today demonstrates   Echo 09/21/20:   1. Left ventricular ejection fraction, by estimation, is 50 to 55%. The  left ventricle has low normal function. The left ventricle demonstrates  regional wall motion abnormalities (see scoring diagram/findings for  description). There is mild left  ventricular hypertrophy. Left ventricular diastolic parameters are  consistent with Grade I  diastolic dysfunction (impaired relaxation). There  is moderate dyskinesis of the left ventricular, entire septal wall.   2. Right ventricular systolic function is normal. The right ventricular  size is normal. There is normal pulmonary artery systolic pressure. The  estimated right ventricular systolic pressure is 29.5 mmHg.   3. The mitral valve is grossly normal. Trivial mitral valve  regurgitation.   4. The aortic valve has been repaired/replaced. Aortic valve  regurgitation is trivial. There is a St. Jude valve present in the aortic  position. Procedure Date: 2003. Echo findings are consistent with normal  structure and function of the aortic valve  prosthesis. Aortic valve mean gradient measures 18.0 mmHg.   5. Aortic dilatation noted and root/ascending aorta has been  repaired/replaced. There is mild dilatation of the aortic root, measuring  42 mm. There is mild dilatation of the ascending aorta, measuring 40 mm.   Recent Labs: 11/08/2020: TSH 4.05 03/25/2021: ALT 56; Hemoglobin 14.2; Platelet Count 189 07/08/2021: BUN 31; Creatinine, Ser 1.67; Potassium 4.6; Sodium 141   Lipid Panel    Component Value Date/Time   CHOL 165 11/08/2020 0922   TRIG 239.0 (H) 11/08/2020 0922   HDL 40.40 11/08/2020 0922   CHOLHDL 4 11/08/2020 0922   VLDL 47.8 (H) 11/08/2020 0922   LDLCALC 88 09/13/2018 0901   LDLDIRECT 96.0 11/08/2020 0922     Wt Readings from Last 3 Encounters:  07/08/21 219 lb 9.6 oz (99.6 kg)  03/25/21 219 lb 14.4 oz (99.7 kg)  02/25/21 220 lb 11.2 oz (100.1 kg)      Other studies Reviewed: Additional studies/ records that were reviewed today include: . Review of the above records demonstrates:    Assessment and Plan:   1. THORACIC AORTIC ANEURYSM/DISSECTION: He is known to have a type A aortic dissection with extension from his thoracic aorta throughout both iliac arteries affecting the right renal artery but his has been stable. Stable by chest CTA November 2020. Repeat chest CTA now. BMET today.   2. S/P aortic valve replacement: Mechanical valve functioning well by echo February 2022  3. Mitral regurgitation: Trivial by echo February 2022.   4. HTN: BP is well controlled. No changes  Current medicines are reviewed at length with the patient today.  The patient does not have concerns regarding medicines.  The following changes have been made:  no change  Labs/ tests ordered today include:   Orders Placed This Encounter  Procedures   CT ANGIO CHEST AORTA W/CM & OR WO/CM   Basic metabolic panel   EKG [id number]    Disposition:   F/U with me in 12  months  Signed, Lauree Chandler, MD 07/11/2021 7:24 AM    [facility] [address], [place], [place]  [address] Phone: [phone]; Fax: [phone]

## 2021-07-08 NOTE — Patient Instructions (Addendum)
Medication Instructions:  Your physician recommends that you continue on your current medications as directed. Please refer to the Current Medication list given to you today.  *If you need a refill on your cardiac medications before your next appointment, please call your pharmacy*   Lab Work: Bmp- Today   If you have labs (blood work) drawn today and your tests are completely normal, you will receive your results only by: [place] (if you have MyChart) OR A paper copy in the mail If you have any lab test that is abnormal or we need to change your treatment, we will call you to review the results.   Testing/Procedures: Your physician has requested that you have a Chest CTA of the Aorta    Follow-Up: At [facility], you and your health needs are our priority.  As part of our continuing mission to provide you with exceptional heart care, we have created designated Provider Care Teams.  These Care Teams include your primary Cardiologist (physician) and Advanced Practice Providers (APPs -  Physician Assistants and Nurse Practitioners) who all work together to provide you with the care you need, when you need it.  We recommend signing up for the patient portal called "MyChart".  Sign up information is provided on this After Visit Summary.  MyChart is used to connect with patients for Virtual Visits (Telemedicine).  Patients are able to view lab/test results, encounter notes, upcoming appointments, etc.  Non-urgent messages can be sent to your provider as well.   To learn more about what you can do with MyChart, go to [email or web address].    Your next appointment:   12 month(s)  The format for your next appointment:   In Person  Provider:   Lauree Chandler, MD {    Other Instructions

## 2021-07-12 ENCOUNTER — Other Ambulatory Visit: Payer: BC Managed Care – PPO

## 2021-07-12 ENCOUNTER — Ambulatory Visit: Payer: BC Managed Care – PPO | Admitting: Oncology

## 2021-07-12 ENCOUNTER — Ambulatory Visit: Payer: BC Managed Care – PPO

## 2021-07-19 ENCOUNTER — Inpatient Hospital Stay: Payer: BC Managed Care – PPO

## 2021-07-19 ENCOUNTER — Inpatient Hospital Stay: Payer: BC Managed Care – PPO | Attending: Physician Assistant

## 2021-07-19 ENCOUNTER — Inpatient Hospital Stay (HOSPITAL_BASED_OUTPATIENT_CLINIC_OR_DEPARTMENT_OTHER): Payer: BC Managed Care – PPO | Admitting: Oncology

## 2021-07-19 ENCOUNTER — Other Ambulatory Visit: Payer: Self-pay

## 2021-07-19 VITALS — BP 131/74 | HR 64 | Temp 97.8°F | Resp 18 | Ht 68.0 in | Wt 219.9 lb

## 2021-07-19 DIAGNOSIS — C469 Kaposi's sarcoma, unspecified: Secondary | ICD-10-CM

## 2021-07-19 DIAGNOSIS — C467 Kaposi's sarcoma of other sites: Secondary | ICD-10-CM | POA: Insufficient documentation

## 2021-07-19 DIAGNOSIS — R972 Elevated prostate specific antigen [PSA]: Secondary | ICD-10-CM

## 2021-07-19 DIAGNOSIS — Z95828 Presence of other vascular implants and grafts: Secondary | ICD-10-CM

## 2021-07-19 LAB — CBC WITH DIFFERENTIAL (CANCER CENTER ONLY)
Abs Immature Granulocytes: 0.02 10*3/uL (ref 0.00–0.07)
Basophils Absolute: 0 10*3/uL (ref 0.0–0.1)
Basophils Relative: 0 %
Eosinophils Absolute: 0.2 10*3/uL (ref 0.0–0.5)
Eosinophils Relative: 3 %
HCT: 41.4 % (ref 39.0–52.0)
Hemoglobin: 14.2 g/dL (ref 13.0–17.0)
Immature Granulocytes: 0 %
Lymphocytes Relative: 15 %
Lymphs Abs: 0.9 10*3/uL (ref 0.7–4.0)
MCH: 28.5 pg (ref 26.0–34.0)
MCHC: 34.3 g/dL (ref 30.0–36.0)
MCV: 83.1 fL (ref 80.0–100.0)
Monocytes Absolute: 0.7 10*3/uL (ref 0.1–1.0)
Monocytes Relative: 11 %
Neutro Abs: 4.3 10*3/uL (ref 1.7–7.7)
Neutrophils Relative %: 71 %
Platelet Count: 177 10*3/uL (ref 150–400)
RBC: 4.98 MIL/uL (ref 4.22–5.81)
RDW: 12.4 % (ref 11.5–15.5)
WBC Count: 6.1 10*3/uL (ref 4.0–10.5)
nRBC: 0 % (ref 0.0–0.2)

## 2021-07-19 LAB — CMP (CANCER CENTER ONLY)
ALT: 29 U/L (ref 0–44)
AST: 28 U/L (ref 15–41)
Albumin: 4.1 g/dL (ref 3.5–5.0)
Alkaline Phosphatase: 34 U/L — ABNORMAL LOW (ref 38–126)
Anion gap: 6 (ref 5–15)
BUN: 33 mg/dL — ABNORMAL HIGH (ref 8–23)
CO2: 27 mmol/L (ref 22–32)
Calcium: 9.2 mg/dL (ref 8.9–10.3)
Chloride: 106 mmol/L (ref 98–111)
Creatinine: 1.63 mg/dL — ABNORMAL HIGH (ref 0.61–1.24)
GFR, Estimated: 46 mL/min — ABNORMAL LOW (ref >60)
Glucose, Bld: 198 mg/dL — ABNORMAL HIGH (ref 70–99)
Potassium: 4.2 mmol/L (ref 3.5–5.1)
Sodium: 139 mmol/L (ref 135–145)
Total Bilirubin: 0.5 mg/dL (ref 0.3–1.2)
Total Protein: 6.8 g/dL (ref 6.5–8.1)

## 2021-07-19 MED ORDER — DEXTROSE 5 % IV SOLN: Freq: Once | INTRAVENOUS | Status: AC

## 2021-07-19 MED ORDER — DOXORUBICIN HCL LIPOSOMAL CHEMO INJECTION 2 MG/ML
27.0000 mg/m2 | Freq: Once | INTRAVENOUS | Status: AC
  Administered 2021-07-19: 16:00:00 60 mg via INTRAVENOUS
  Filled 2021-07-19: qty 30

## 2021-07-19 MED ORDER — SODIUM CHLORIDE 0.9 % IV SOLN
10.0000 mg | Freq: Once | INTRAVENOUS | Status: AC
  Administered 2021-07-19: 15:00:00 10 mg via INTRAVENOUS
  Filled 2021-07-19: qty 10

## 2021-07-19 MED ORDER — HEPARIN SOD (PORK) LOCK FLUSH 100 UNIT/ML IV SOLN
500.0000 [IU] | Freq: Once | INTRAVENOUS | Status: AC | PRN
  Administered 2021-07-19: 17:00:00 500 [IU]

## 2021-07-19 MED ORDER — SODIUM CHLORIDE 0.9% FLUSH
10.0000 mL | Freq: Once | INTRAVENOUS | Status: AC
  Administered 2021-07-19: 13:00:00 10 mL

## 2021-07-19 MED ORDER — SODIUM CHLORIDE 0.9% FLUSH
10.0000 mL | INTRAVENOUS | Status: DC | PRN
  Administered 2021-07-19: 17:00:00 10 mL

## 2021-07-19 NOTE — Patient Instructions (Signed)
[facility] MEDICAL [facility]COLOGY  Discharge Instructions: °Thank you for choosing [facility] to provide your oncology and hematology care.  ° °If you have a lab appointment with the Cancer Center, please go directly to the Cancer Center and check in at the registration area. °  °Wear comfortable clothing and clothing appropriate for easy access to any Portacath or PICC line.  ° °We strive to give you quality time with your provider. You may need to reschedule your appointment if you arrive late (15 or more minutes).  Arriving late affects you and other patients whose appointments are after yours.  Also, if you miss three or more appointments without notifying the office, you may be dismissed from the clinic at the provider’s discretion.    °  °For prescription refill requests, have your pharmacy contact our office and allow 72 hours for refills to be completed.   ° °Today you received the following chemotherapy and/or immunotherapy agent: Doxorubicin Liposomal (Doxil)  °  °To help prevent nausea and vomiting after your treatment, we encourage you to take your nausea medication as directed. ° °BELOW ARE SYMPTOMS THAT SHOULD BE REPORTED IMMEDIATELY: °*FEVER GREATER THAN 100.4 F (38 °C) OR HIGHER °*CHILLS OR SWEATING °*NAUSEA AND VOMITING THAT IS NOT CONTROLLED WITH YOUR NAUSEA MEDICATION °*UNUSUAL SHORTNESS OF BREATH °*UNUSUAL BRUISING OR BLEEDING °*URINARY PROBLEMS (pain or burning when urinating, or frequent urination) °*BOWEL PROBLEMS (unusual diarrhea, constipation, pain near the anus) °TENDERNESS IN MOUTH AND THROAT WITH OR WITHOUT PRESENCE OF ULCERS (sore throat, sores in mouth, or a toothache) °UNUSUAL RASH, SWELLING OR PAIN  °UNUSUAL VAGINAL DISCHARGE OR ITCHING  ° °Items with * indicate a potential emergency and should be followed up as soon as possible or go to the Emergency Department if any problems should occur. ° °Please show the CHEMOTHERAPY ALERT CARD or IMMUNOTHERAPY ALERT  CARD at check-in to the Emergency Department and triage nurse. ° °Should you have questions after your visit or need to cancel or reschedule your appointment, please contact [facility]  Dept: [phone]  and follow the prompts.  Office hours are 8:00 a.m. to 4:30 p.m. Monday - Friday. Please note that voicemails left after 4:00 p.m. may not be returned until the following business day.  We are closed weekends and major holidays. You have access to a nurse at all times for urgent questions. Please call the main number to the clinic Dept: [phone] and follow the prompts. ° ° °For any non-urgent questions, you may also contact your provider using MyChart. We now offer e-Visits for anyone 18 and older to request care online for non-urgent symptoms. For details visit [email or web address]. °  °Also download the MyChart app! Go to the app store, search "MyChart", open the app, select [facility], and log in with your MyChart username and password. ° °Due to Covid, a mask is required upon entering the hospital/clinic. If you do not have a mask, one will be given to you upon arrival. For doctor visits, patients may have 1 support person aged 18 or older with them. For treatment visits, patients cannot have anyone with them due to current Covid guidelines and our immunocompromised population.  ° °

## 2021-07-19 NOTE — Progress Notes (Signed)
Hematology and Oncology Follow Up Visit  Austin [name] [id number] 1955-08-24 65 y.o. 07/19/2021 1:19 PM [name], MDJohn, [name], MD   Principle Diagnosis: 65 year old man with extensive Kaposi's sarcoma involving lower extremities and trunk with relapsed disease in 2022.  He initially was diagnosed in 2016.     Past therapy:  Doxil chemotherapy given at 30 mg/m every 4 weeks the first cycle given at [facility] in June 2016.   He completed 6 cycles of therapy in November 2016. He developed relapsed disease in September 2019. Doxil 30 mg/m every 4 weeks restarted in September 2019.  He completed 6 months of therapy.  He developed relapsed disease February 2022 with increased skin lesions on his lower extremities and drainage.   Doxil 60 mg total dose every 4 weeks restarted on September 13, 2020.  Last infusion given in August 2022.   Current therapy: Doxil 60 mg total dose every 3 months.  He is scheduled to receive maintenance dosing starting today.  Interim History:  Austin [name] is here for a follow-up evaluation.  Since the last visit, he reports no major changes in his health.  He denies any recent hospitalizations or illnesses.  He denies any bone pain or pathological fractures.  He has a reported mild erythema that started to develop on his right lower extremity.  No desquamation or raised lesions noted.       Medications: Reviewed without changes. Current Outpatient Medications  Medication Sig Dispense Refill   fenofibrate 160 MG tablet Take 1 tablet (160 mg total) by mouth daily. 90 tablet 3   ferrous sulfate 325 (65 FE) MG tablet Take 1 tablet (325 mg total) by mouth daily with breakfast. 30 tablet 3   furosemide (LASIX) 20 MG tablet Take 1 tablet daily x 3 days, then stop. And repeat if swelling recurs. 30 tablet 5   lidocaine-prilocaine (EMLA) cream Apply 1 application topically as needed. 30 g 0   losartan (COZAAR) 25 MG tablet Take 1  tablet (25 mg total) by mouth daily. 90 tablet 3   metoprolol (TOPROL-XL) 200 MG 24 hr tablet TAKE 1 TABLET DAILY 90 tablet 3   Multiple Vitamin (MULTIVITAMIN) tablet Take 1 tablet by mouth daily.     rosuvastatin (CRESTOR) 40 MG tablet TAKE 1 TABLET DAILY 90 tablet 3   warfarin (COUMADIN) 5 MG tablet TAKE ONE TABLET DAILY EXCEPT TAKE ONE AND ONE-HALF TABLETS ON WEDNESDAYS OR TAKE AS DIRECTED BY ANTICOAGULATION CLINIC 120 tablet 3   No current facility-administered medications for this visit.     Allergies:  Allergies  Allergen Reactions   Celecoxib Itching and Other (See Comments)    Joint pain       Physical Exam:          Blood pressure 131/74, pulse 64, temperature 97.8 F (36.6 C), temperature source Temporal, resp. rate 18, height 5\' 8"  (1.727 m), weight 219 lb 14.4 oz (99.7 kg), SpO2 99 %.        ECOG: 0   General appearance: Comfortable appearing without any discomfort Head: Normocephalic without any trauma Oropharynx: Mucous membranes are moist and pink without any thrush or ulcers. Eyes: Pupils are equal and round reactive to light. Lymph nodes: No cervical, supraclavicular, inguinal or axillary lymphadenopathy.   Heart:regular rate and rhythm.  S1 and S2 without leg edema. Lung: Clear without any rhonchi or wheezes.  No dullness to percussion. Abdomin: Soft, nontender, nondistended with good bowel sounds.  No hepatosplenomegaly.  Musculoskeletal: No joint deformity or effusion.  Full range of motion noted. Neurological: No deficits noted on motor, sensory and deep tendon reflex exam. Skin: No erythema or induration noted.  Scaly dry skin noted on his left lower extremity.  Small erythematous papule noted.            Lab Results: Lab Results  Component Value Date   WBC 5.5 03/25/2021   HGB 14.2 03/25/2021   HCT 41.5 03/25/2021   MCV 84.7 03/25/2021   PLT 189 03/25/2021     Chemistry      Component Value Date/Time   NA 141 07/08/2021  1029   NA 143 11/03/2016 0908   K 4.6 07/08/2021 1029   K 5.0 11/03/2016 0908   CL 104 07/08/2021 1029   CO2 25 07/08/2021 1029   CO2 27 11/03/2016 0908   BUN 31 (H) 07/08/2021 1029   BUN 33.5 (H) 11/03/2016 0908   CREATININE 1.67 (H) 07/08/2021 1029   CREATININE 1.79 (H) 03/25/2021 1230   CREATININE 1.5 (H) 11/03/2016 0908      Component Value Date/Time   CALCIUM 9.6 07/08/2021 1029   CALCIUM 9.5 11/03/2016 0908   ALKPHOS 31 (L) 03/25/2021 1230   ALKPHOS 25 (L) 11/03/2016 0908   AST 54 (H) 03/25/2021 1230   AST 29 11/03/2016 0908   ALT 56 (H) 03/25/2021 1230   ALT 29 11/03/2016 0908   BILITOT 0.6 03/25/2021 1230   BILITOT 0.70 11/03/2016 0908      Impression and Plan:   65 year old with  1.  Extensive Kaposi's sarcoma involving the lower extremities and trunk with relapsed disease in 2022 after initially diagnosed in 2016.     His disease status was updated at this time and treatment choices were reviewed.  Risks and benefits of resuming Doxil maintenance were discussed at this time.  Complications include nausea, vomiting, myelosuppression and neutropenia were reiterated.  After discussion today, he is agreeable to proceed given the likelihood that he will develop recurrent disease without treatment.  We have opted to continue with every 33-month schedule which will be adjusted if we need to in the future.   2. Cardiomyopathy: No exacerbation noted with Doxil.  We will continue to monitor.   3.  IV access: Port-A-Cath remains in use without any issues.   4. Follow-up: In 3 months for repeat evaluation next occipital infusion.   30  minutes were dedicated to this encounter.  The time was spent on reviewing laboratory data, disease status update and outlining future plan of care discussion.    Zola Button, MD 12/20/20221:19 PM

## 2021-07-19 NOTE — Progress Notes (Signed)
Per Dr. Alen Blew, ok for treatment today with serum creatine 1.63.

## 2021-07-21 ENCOUNTER — Ambulatory Visit (INDEPENDENT_AMBULATORY_CARE_PROVIDER_SITE_OTHER)
Admission: RE | Admit: 2021-07-21 | Discharge: 2021-07-21 | Disposition: A | Discharge status: Home / Self Care | Payer: BC Managed Care – PPO | Source: Ambulatory Visit | Attending: Cardiovascular Disease | Admitting: Cardiovascular Disease

## 2021-07-21 ENCOUNTER — Other Ambulatory Visit: Payer: Self-pay

## 2021-07-21 DIAGNOSIS — I517 Cardiomegaly: Secondary | ICD-10-CM | POA: Diagnosis not present

## 2021-07-21 DIAGNOSIS — I712 Thoracic aortic aneurysm, without rupture, unspecified: Secondary | ICD-10-CM | POA: Diagnosis not present

## 2021-07-21 DIAGNOSIS — R911 Solitary pulmonary nodule: Secondary | ICD-10-CM | POA: Diagnosis not present

## 2021-07-21 DIAGNOSIS — J9811 Atelectasis: Secondary | ICD-10-CM | POA: Diagnosis not present

## 2021-07-21 MED ORDER — IOHEXOL 350 MG/ML SOLN
80.0000 mL | Freq: Once | INTRAVENOUS | Status: AC | PRN
  Administered 2021-07-21: 15:00:00 80 mL via INTRAVENOUS

## 2021-07-28 ENCOUNTER — Telehealth: Payer: Self-pay | Admitting: Oncology

## 2021-07-28 NOTE — Telephone Encounter (Signed)
Sch per 12/20 los, pt aware °

## 2021-08-04 ENCOUNTER — Other Ambulatory Visit: Payer: Self-pay

## 2021-08-04 ENCOUNTER — Ambulatory Visit (INDEPENDENT_AMBULATORY_CARE_PROVIDER_SITE_OTHER): Payer: BC Managed Care – PPO

## 2021-08-04 DIAGNOSIS — Z7901 Long term (current) use of anticoagulants: Secondary | ICD-10-CM

## 2021-08-04 LAB — POCT INR: INR: 1.7 — AB (ref 2.0–3.0)

## 2021-08-04 NOTE — Patient Instructions (Addendum)
Pre visit review using our clinic review tool, if applicable. No additional management support is needed unless otherwise documented below in the visit note.  Increase dose to take 1 1/2 tablet today and then continue to take 1 tablet daily except take 1/2 tablet on Tuesdays and Fridays.  Re-check in 5 weeks per pt request.

## 2021-08-04 NOTE — Progress Notes (Signed)
Increase dose to take 1 1/2 tablet today and then continue to take 1 tablet daily except take 1/2 tablet on Tuesdays and Fridays.  Re-check in 5 weeks per pt request.

## 2021-09-08 ENCOUNTER — Ambulatory Visit: Payer: BC Managed Care – PPO

## 2021-09-08 ENCOUNTER — Other Ambulatory Visit: Payer: Self-pay

## 2021-09-08 DIAGNOSIS — Z7901 Long term (current) use of anticoagulants: Secondary | ICD-10-CM

## 2021-09-08 LAB — POCT INR: INR: 2.7 (ref 2.0–3.0)

## 2021-09-08 NOTE — Progress Notes (Signed)
Patient ID: Austin [name], male   DOB: [birth year], 66 y.o.   MRN: [id number]  Medical screening examination/treatment/procedure(s) were performed by non-physician practitioner and as supervising physician I was immediately available for consultation/collaboration.  I agree with above. Cathlean Cower, MD

## 2021-09-08 NOTE — Patient Instructions (Addendum)
Pre visit review using our clinic review tool, if applicable. No additional management support is needed unless otherwise documented below in the visit note.  Continue to take 1 tablet daily except take 1/2 tablet on Tuesdays and Fridays.  Re-check in 6 weeks.

## 2021-09-08 NOTE — Progress Notes (Signed)
Continue to take 1 tablet daily except take 1/2 tablet on Tuesdays and Fridays.  Re-check in 6 weeks.

## 2021-09-19 DIAGNOSIS — N1832 Chronic kidney disease, stage 3b: Secondary | ICD-10-CM | POA: Diagnosis not present

## 2021-09-28 DIAGNOSIS — N1832 Chronic kidney disease, stage 3b: Secondary | ICD-10-CM | POA: Diagnosis not present

## 2021-09-28 DIAGNOSIS — D649 Anemia, unspecified: Secondary | ICD-10-CM | POA: Diagnosis not present

## 2021-09-28 DIAGNOSIS — I10 Essential (primary) hypertension: Secondary | ICD-10-CM | POA: Diagnosis not present

## 2021-10-07 ENCOUNTER — Ambulatory Visit: Payer: BC Managed Care – PPO | Admitting: Internal Medicine

## 2021-10-11 ENCOUNTER — Ambulatory Visit: Payer: BC Managed Care – PPO | Admitting: Internal Medicine

## 2021-10-11 ENCOUNTER — Other Ambulatory Visit: Payer: Self-pay

## 2021-10-11 ENCOUNTER — Other Ambulatory Visit (INDEPENDENT_AMBULATORY_CARE_PROVIDER_SITE_OTHER): Payer: BC Managed Care – PPO

## 2021-10-11 ENCOUNTER — Encounter: Payer: Self-pay | Admitting: Internal Medicine

## 2021-10-11 VITALS — BP 114/66 | HR 67 | Temp 99.1°F | Ht 68.0 in | Wt 201.0 lb

## 2021-10-11 DIAGNOSIS — Z23 Encounter for immunization: Secondary | ICD-10-CM

## 2021-10-11 DIAGNOSIS — E1165 Type 2 diabetes mellitus with hyperglycemia: Secondary | ICD-10-CM

## 2021-10-11 DIAGNOSIS — Z125 Encounter for screening for malignant neoplasm of prostate: Secondary | ICD-10-CM | POA: Diagnosis not present

## 2021-10-11 DIAGNOSIS — E559 Vitamin D deficiency, unspecified: Secondary | ICD-10-CM | POA: Diagnosis not present

## 2021-10-11 DIAGNOSIS — E538 Deficiency of other specified B group vitamins: Secondary | ICD-10-CM

## 2021-10-11 DIAGNOSIS — R739 Hyperglycemia, unspecified: Secondary | ICD-10-CM

## 2021-10-11 DIAGNOSIS — E7800 Pure hypercholesterolemia, unspecified: Secondary | ICD-10-CM

## 2021-10-11 DIAGNOSIS — E039 Hypothyroidism, unspecified: Secondary | ICD-10-CM | POA: Diagnosis not present

## 2021-10-11 DIAGNOSIS — Z0001 Encounter for general adult medical examination with abnormal findings: Secondary | ICD-10-CM

## 2021-10-11 DIAGNOSIS — N1831 Chronic kidney disease, stage 3a: Secondary | ICD-10-CM

## 2021-10-11 LAB — BASIC METABOLIC PANEL
BUN: 27 mg/dL — ABNORMAL HIGH (ref 6–23)
CO2: 27 mEq/L (ref 19–32)
Calcium: 9.3 mg/dL (ref 8.4–10.5)
Chloride: 95 mEq/L — ABNORMAL LOW (ref 96–112)
Creatinine, Ser: 1.57 mg/dL — ABNORMAL HIGH (ref 0.40–1.50)
GFR: 45.66 mL/min — ABNORMAL LOW (ref >60.00)
Glucose, Bld: 479 mg/dL — ABNORMAL HIGH (ref 70–99)
Potassium: 4.3 mEq/L (ref 3.5–5.1)
Sodium: 130 mEq/L — ABNORMAL LOW (ref 135–145)

## 2021-10-11 LAB — CBC WITH DIFFERENTIAL/PLATELET
Basophils Absolute: 0 10*3/uL (ref 0.0–0.1)
Basophils Relative: 0.8 % (ref 0.0–3.0)
Eosinophils Absolute: 0.1 10*3/uL (ref 0.0–0.7)
Eosinophils Relative: 1.6 % (ref 0.0–5.0)
HCT: 44.3 % (ref 39.0–52.0)
Hemoglobin: 15.3 g/dL (ref 13.0–17.0)
Lymphocytes Relative: 24.8 % (ref 12.0–46.0)
Lymphs Abs: 1 10*3/uL (ref 0.7–4.0)
MCHC: 34.6 g/dL (ref 30.0–36.0)
MCV: 82.7 fl (ref 78.0–100.0)
Monocytes Absolute: 0.4 10*3/uL (ref 0.1–1.0)
Monocytes Relative: 10.2 % (ref 3.0–12.0)
Neutro Abs: 2.6 10*3/uL (ref 1.4–7.7)
Neutrophils Relative %: 62.6 % (ref 43.0–77.0)
Platelets: 155 10*3/uL (ref 150.0–400.0)
RBC: 5.35 Mil/uL (ref 4.22–5.81)
RDW: 14.1 % (ref 11.5–15.5)
WBC: 4.1 10*3/uL (ref 4.0–10.5)

## 2021-10-11 LAB — HEPATIC FUNCTION PANEL
ALT: 32 U/L (ref 0–53)
AST: 30 U/L (ref 0–37)
Albumin: 4.3 g/dL (ref 3.5–5.2)
Alkaline Phosphatase: 42 U/L (ref 39–117)
Bilirubin, Direct: 0.2 mg/dL (ref 0.0–0.3)
Total Bilirubin: 0.9 mg/dL (ref 0.2–1.2)
Total Protein: 6.2 g/dL (ref 6.0–8.3)

## 2021-10-11 LAB — LIPID PANEL
Cholesterol: 208 mg/dL — ABNORMAL HIGH (ref 0–200)
HDL: 35.5 mg/dL — ABNORMAL LOW (ref >39.00)
Total CHOL/HDL Ratio: 6
Triglycerides: 478 mg/dL — ABNORMAL HIGH (ref 0.0–149.0)

## 2021-10-11 LAB — MICROALBUMIN / CREATININE URINE RATIO
Creatinine,U: 44.8 mg/dL
Microalb Creat Ratio: 17.5 mg/g (ref 0.0–30.0)
Microalb, Ur: 7.9 mg/dL — ABNORMAL HIGH (ref 0.0–1.9)

## 2021-10-11 LAB — TSH: TSH: 4.37 u[IU]/mL (ref 0.35–5.50)

## 2021-10-11 LAB — HEMOGLOBIN A1C: Hgb A1c MFr Bld: 13.6 % — ABNORMAL HIGH (ref 4.6–6.5)

## 2021-10-11 LAB — PSA: PSA: 1.57 ng/mL (ref 0.10–4.00)

## 2021-10-11 NOTE — Progress Notes (Signed)
Patient ID: [place], male   DOB: 1956/06/02, 66 y.o.   MRN: [id number] ? ? ? ?     Chief Complaint:: wellness exam and dm, hld, htn, hypothyroidism ? ?     HPI:  Austin [name] is a 66 y.o. male here for wellness exam; due for pneumovax, plans to call himself for optho exam, decliens covid booster, shingrix, flu shot o/w up to date ?         ?              Also Pt denies chest pain, increased sob or doe, wheezing, orthopnea, PND, increased LE swelling, palpitations, dizziness or syncope.   Pt denies polydipsia, polyuria, or new focal neuro s/s, but did have elevated BS recently and asked to f/u here.   Pt denies fever, wt loss, night sweats, loss of appetite, or other constitutional symptoms     ?  ?Wt Readings from Last 3 Encounters:  ?10/11/21 201 lb (91.2 kg)  ?07/19/21 219 lb 14.4 oz (99.7 kg)  ?07/08/21 219 lb 9.6 oz (99.6 kg)  ? ?BP Readings from Last 3 Encounters:  ?10/11/21 114/66  ?07/19/21 131/74  ?07/08/21 126/80  ? ?Immunization History  ?Administered Date(s) Administered  ? Influenza Split 05/15/2011, 07/29/2012  ? Influenza Whole 07/19/2005, 04/30/2006, 08/12/2009  ? Influenza,inj,Quad PF,6+ Mos 05/13/2013, 07/01/2014, 05/21/2015, 04/21/2016, 04/11/2017, 05/02/2018, 04/22/2019  ? Influenza-Unspecified 05/03/2020  ? PFIZER(Purple Top)SARS-COV-2 Vaccination 10/13/2019, 11/03/2019, 05/03/2020, 04/18/2021  ? Pneumococcal Conjugate-13 07/03/2013  ? Pneumococcal Polysaccharide-23 03/07/2006, 10/11/2021  ? Td 03/30/2009  ? Tdap 09/17/2019  ? ?There are no preventive care reminders to display for this patient. ? ?  ? ?Past Medical History:  ?Diagnosis Date  ? Anemia   ? iron def  ? Aortic valve prosthesis present   ? a. SJM - [facility] 2003, chronic coumadin.  ? Back pain   ? CKD (chronic kidney disease), stage II   ? Coronary artery disease   ? not sure where this dx came from - pt denies  ? Family history of melanoma   ? Hearing loss   ? Hyperlipidemia   ? Hypertension   ? Internal hemorrhoid   ? 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 Obesity   ? Pericardial effusion   ? Thoracic aortic aneurysm   ? a. s/p dissection and repair @ [facility] 2003 with SJM AVR with residual findings followed by CT.  ? Thyroid disease   ? ?Past Surgical History:  ?Procedure Laterality Date  ? ABDOMINAL AORTIC ANEURYSM REPAIR    ? AORTIC VALVE REPLACEMENT    ? impingment    ? right shoulder  ? IR IMAGING GUIDED PORT INSERTION  09/17/2020  ? KNEE ARTHROSCOPY    ? left  ? WISDOM TOOTH EXTRACTION    ? ? reports that he quit smoking about 20 years ago. His smoking use included cigarettes. He has a 15.00 pack-year smoking history. He has never used smokeless tobacco. He reports current alcohol use. He reports that he does not use drugs. ?family history includes Heart disease in his paternal aunt; Liver disease in his sister; Melanoma in his maternal uncle; Other in his maternal grandfather; Stroke in his mother; Stroke (age of onset: 29) in his father. ?Allergies  ?Allergen Reactions  ? Celecoxib Itching and Other (See Comments)  ?  Joint pain  ? ?Current Outpatient Medications on File Prior to Visit  ?Medication Sig Dispense Refill  ? fenofibrate 160 MG tablet Take 1 tablet (160 mg total)  by mouth daily. 90 tablet 3  ? ferrous sulfate 325 (65 FE) MG tablet Take 1 tablet (325 mg total) by mouth daily with breakfast. 30 tablet 3  ? furosemide (LASIX) 20 MG tablet Take 1 tablet daily x 3 days, then stop. And repeat if swelling recurs. 30 tablet 5  ? lidocaine-prilocaine (EMLA) cream Apply 1 application topically as needed. 30 g 0  ? losartan (COZAAR) 25 MG tablet Take 1 tablet (25 mg total) by mouth daily. 90 tablet 3  ? metoprolol (TOPROL-XL) 200 MG 24 hr tablet TAKE 1 TABLET DAILY 90 tablet 3  ? Multiple Vitamin (MULTIVITAMIN) tablet Take 1 tablet by mouth daily.    ? rosuvastatin (CRESTOR) 40 MG tablet TAKE 1 TABLET DAILY 90 tablet 3  ? warfarin (COUMADIN) 5 MG tablet TAKE ONE TABLET DAILY EXCEPT TAKE ONE AND ONE-HALF TABLETS ON WEDNESDAYS OR TAKE AS DIRECTED  BY ANTICOAGULATION CLINIC 120 tablet 3  ? ?No current facility-administered medications on file prior to visit.  ? ?     ROS:  All others reviewed and negative. ? ?Objective  ? ?     PE:  BP 114/66 (BP Location: Left Arm, Patient Position: Sitting, Cuff Size: Large)   Pulse 67   Temp 99.1 ?F (37.3 ?C) (Oral)   Ht '5\' 8"'$  (1.727 m)   Wt 201 lb (91.2 kg)   SpO2 97%   BMI 30.56 kg/m?  ? ?              Constitutional: Pt appears in NAD ?              HENT: Head: NCAT.  ?              Right Ear: External ear normal.   ?              Left Ear: External ear normal.  ?              Eyes: . Pupils are equal, round, and reactive to light. Conjunctivae and EOM are normal ?              Nose: without d/c or deformity ?              Neck: Neck supple. Gross normal ROM ?              Cardiovascular: Normal rate and regular rhythm.   ?              Pulmonary/Chest: Effort normal and breath sounds without rales or wheezing.  ?              Abd:  Soft, NT, ND, + BS, no organomegaly ?              Neurological: Pt is alert. At baseline orientation, motor grossly intact ?              Skin: Skin is warm. No rashes, no other new lesions, LE edema - none ?              Psychiatric: Pt behavior is normal without agitation  ? ?Micro: none ? ?Cardiac tracings I have personally interpreted today:  none ? ?Pertinent Radiological findings (summarize): none  ? ?Lab Results  ?Component Value Date  ? WBC 4.1 10/11/2021  ? HGB 15.3 10/11/2021  ? HCT 44.3 10/11/2021  ? PLT 155.0 10/11/2021  ? GLUCOSE 479 (H) 10/11/2021  ? CHOL 208 (H) 10/11/2021  ? TRIG (H) 10/11/2021  ?  478.0 Triglyceride is over 400; calculations on Lipids are invalid.  ? HDL 35.50 (L) 10/11/2021  ? LDLDIRECT 107.0 10/11/2021  ? [place] 88 09/13/2018  ? ALT 32 10/11/2021  ? AST 30 10/11/2021  ? NA 130 (L) 10/11/2021  ? K 4.3 10/11/2021  ? CL 95 (L) 10/11/2021  ? CREATININE 1.57 (H) 10/11/2021  ? BUN 27 (H) 10/11/2021  ? CO2 27 10/11/2021  ? TSH 4.37 10/11/2021  ? PSA 1.57  10/11/2021  ? INR 2.7 09/08/2021  ? HGBA1C 13.6 (H) 10/11/2021  ? MICROALBUR 7.9 (H) 10/11/2021  ? ?Assessment/Plan:  ?ALMUS WOODHAM is a 66 y.o. Black or African American [2] male with  has a past medical history of Anemia, Aortic valve prosthesis present, Back pain, CKD (chronic kidney disease), stage II, Coronary artery disease, Family history of melanoma, Hearing loss, Hyperlipidemia, Hypertension, Internal hemorrhoid, Kaposi sarcoma ([place]), Obesity, Pericardial effusion, Thoracic aortic aneurysm, and Thyroid disease. ? ?Encounter for well adult exam with abnormal findings ?Age and sex appropriate education and counseling updated with regular exercise and diet ?Referrals for preventative services - pt to call for optho exam ?Immunizations addressed - for pneumovax, but no covid booster, shingrix, flu shot ?Smoking counseling  - none needed ?Evidence for depression or other mood disorder - none significant ?Most recent labs reviewed. ?I have personally reviewed and have noted: ?1) the patient's medical and social history ?2) The patient's current medications and supplements ?3) The patient's height, weight, and BMI have been recorded in the chart ? ? ?Hypothyroidism ?Lab Results  ?Component Value Date  ? TSH 4.37 10/11/2021  ? ?Stable, pt to continue levothyroxine ? ? ?HLD (hyperlipidemia) ?Lab Results  ?Component Value Date  ? [place] 88 09/13/2018  ? ?Uncontrolled, goal ldl < 70, pt to continue current statin crestor and f/u lab, cont diet ? ? ?Diabetes ([place]) ?likley uncontrolled, for a1c with labs, may need [facility] tx for a1c > 7 ? ?CKD (chronic kidney disease) ?Lab Results  ?Component Value Date  ? CREATININE 1.57 (H) 10/11/2021  ? ?Stable overall, cont to avoid nephrotoxins ? ?Followup: Return in about 6 months (around 04/13/2022). ? ?Cathlean Cower, MD 10/14/2021 8:57 PM ?[facility] ?Internal Medicine ?

## 2021-10-11 NOTE — Patient Instructions (Signed)
You had the Pneumovax pneumonia shot today ? ?Please continue all other medications as before, and refills have been done if requested. ? ?Please have the pharmacy call with any other refills you may need. ? ?Please continue your efforts at being more active, low cholesterol diet, and weight control. ? ?You are otherwise up to date with prevention measures today. ? ?Please keep your appointments with your specialists as you may have planned ? ?Please go to the LAB at the blood drawing area for the tests to be done- at the [facility] ? ?You will be contacted by phone if any changes need to be made immediately.  Otherwise, you will receive a letter about your results with an explanation, but please check with MyChart first. ? ?Please remember to sign up for MyChart if you have not done so, as this will be important to you in the future with finding out test results, communicating by private email, and scheduling acute appointments online when needed. ? ?Please make an Appointment to return in 6 months, or sooner if needed ?

## 2021-10-12 ENCOUNTER — Other Ambulatory Visit: Payer: Self-pay | Admitting: Internal Medicine

## 2021-10-12 ENCOUNTER — Encounter: Payer: Self-pay | Admitting: Oncology

## 2021-10-12 DIAGNOSIS — R739 Hyperglycemia, unspecified: Secondary | ICD-10-CM

## 2021-10-12 DIAGNOSIS — E1165 Type 2 diabetes mellitus with hyperglycemia: Secondary | ICD-10-CM

## 2021-10-12 LAB — URINALYSIS, ROUTINE W REFLEX MICROSCOPIC
Bilirubin Urine: NEGATIVE
Hgb urine dipstick: NEGATIVE
Leukocytes,Ua: NEGATIVE
Nitrite: NEGATIVE
RBC / HPF: NONE SEEN (ref 0–?)
Specific Gravity, Urine: <=1.005 — AB (ref 1.000–1.030)
Total Protein, Urine: NEGATIVE
Urine Glucose: >=1000 — AB
Urobilinogen, UA: 0.2 (ref 0.0–1.0)
pH: 7 (ref 5.0–8.0)

## 2021-10-12 LAB — LDL CHOLESTEROL, DIRECT: Direct LDL: 107 mg/dL

## 2021-10-12 LAB — VITAMIN B12: Vitamin B-12: 1394 pg/mL — ABNORMAL HIGH (ref 211–911)

## 2021-10-12 LAB — VITAMIN D 25 HYDROXY (VIT D DEFICIENCY, FRACTURES): VITD: 54.29 ng/mL (ref 30.00–100.00)

## 2021-10-12 MED ORDER — METFORMIN HCL ER 500 MG PO TB24: 1000.0000 mg | ORAL_TABLET | Freq: Every day | ORAL | 3 refills | Status: DC

## 2021-10-14 ENCOUNTER — Encounter: Payer: Self-pay | Admitting: Internal Medicine

## 2021-10-14 NOTE — Assessment & Plan Note (Signed)
Lab Results  ?Component Value Date  ? [place] 88 09/13/2018  ? ?Uncontrolled, goal ldl < 70, pt to continue current statin crestor and f/u lab, cont diet ? ?

## 2021-10-14 NOTE — Assessment & Plan Note (Signed)
likley uncontrolled, for a1c with labs, may need [facility] tx for a1c > 7 ?

## 2021-10-14 NOTE — Assessment & Plan Note (Signed)
Age and sex appropriate education and counseling updated with regular exercise and diet ?Referrals for preventative services - pt to call for optho exam ?Immunizations addressed - for pneumovax, but no covid booster, shingrix, flu shot ?Smoking counseling  - none needed ?Evidence for depression or other mood disorder - none significant ?Most recent labs reviewed. ?I have personally reviewed and have noted: ?1) the patient's medical and social history ?2) The patient's current medications and supplements ?3) The patient's height, weight, and BMI have been recorded in the chart ? ?

## 2021-10-14 NOTE — Assessment & Plan Note (Signed)
Lab Results  ?Component Value Date  ? TSH 4.37 10/11/2021  ? ?Stable, pt to continue levothyroxine ? ?

## 2021-10-14 NOTE — Assessment & Plan Note (Signed)
Lab Results  ?Component Value Date  ? CREATININE 1.57 (H) 10/11/2021  ? ?Stable overall, cont to avoid nephrotoxins ? ?

## 2021-10-18 ENCOUNTER — Ambulatory Visit: Payer: BC Managed Care – PPO

## 2021-10-18 ENCOUNTER — Ambulatory Visit: Payer: BC Managed Care – PPO | Admitting: Oncology

## 2021-10-18 ENCOUNTER — Other Ambulatory Visit: Payer: BC Managed Care – PPO

## 2021-10-21 ENCOUNTER — Inpatient Hospital Stay: Payer: BC Managed Care – PPO

## 2021-10-21 ENCOUNTER — Other Ambulatory Visit: Payer: Self-pay

## 2021-10-21 ENCOUNTER — Inpatient Hospital Stay: Payer: BC Managed Care – PPO | Attending: Oncology | Admitting: Oncology

## 2021-10-21 VITALS — BP 119/75 | HR 71 | Temp 97.8°F | Resp 15 | Ht 68.0 in | Wt 196.8 lb

## 2021-10-21 DIAGNOSIS — R972 Elevated prostate specific antigen [PSA]: Secondary | ICD-10-CM

## 2021-10-21 DIAGNOSIS — C469 Kaposi's sarcoma, unspecified: Secondary | ICD-10-CM | POA: Diagnosis not present

## 2021-10-21 DIAGNOSIS — Z95828 Presence of other vascular implants and grafts: Secondary | ICD-10-CM

## 2021-10-21 DIAGNOSIS — I429 Cardiomyopathy, unspecified: Secondary | ICD-10-CM | POA: Insufficient documentation

## 2021-10-21 DIAGNOSIS — Z5111 Encounter for antineoplastic chemotherapy: Secondary | ICD-10-CM | POA: Insufficient documentation

## 2021-10-21 LAB — CMP (CANCER CENTER ONLY)
ALT: 32 U/L (ref 0–44)
AST: 31 U/L (ref 15–41)
Albumin: 4.1 g/dL (ref 3.5–5.0)
Alkaline Phosphatase: 44 U/L (ref 38–126)
Anion gap: 5 (ref 5–15)
BUN: 18 mg/dL (ref 8–23)
CO2: 27 mmol/L (ref 22–32)
Calcium: 9.1 mg/dL (ref 8.9–10.3)
Chloride: 102 mmol/L (ref 98–111)
Creatinine: 1.44 mg/dL — ABNORMAL HIGH (ref 0.61–1.24)
GFR, Estimated: 54 mL/min — ABNORMAL LOW (ref >60)
Glucose, Bld: 493 mg/dL — ABNORMAL HIGH (ref 70–99)
Potassium: 4.5 mmol/L (ref 3.5–5.1)
Sodium: 134 mmol/L — ABNORMAL LOW (ref 135–145)
Total Bilirubin: 0.9 mg/dL (ref 0.3–1.2)
Total Protein: 6.4 g/dL — ABNORMAL LOW (ref 6.5–8.1)

## 2021-10-21 LAB — CBC WITH DIFFERENTIAL (CANCER CENTER ONLY)
Abs Immature Granulocytes: 0 10*3/uL (ref 0.00–0.07)
Basophils Absolute: 0 10*3/uL (ref 0.0–0.1)
Basophils Relative: 1 %
Eosinophils Absolute: 0.1 10*3/uL (ref 0.0–0.5)
Eosinophils Relative: 1 %
HCT: 42.5 % (ref 39.0–52.0)
Hemoglobin: 15 g/dL (ref 13.0–17.0)
Immature Granulocytes: 0 %
Lymphocytes Relative: 21 %
Lymphs Abs: 0.8 10*3/uL (ref 0.7–4.0)
MCH: 28.7 pg (ref 26.0–34.0)
MCHC: 35.3 g/dL (ref 30.0–36.0)
MCV: 81.3 fL (ref 80.0–100.0)
Monocytes Absolute: 0.4 10*3/uL (ref 0.1–1.0)
Monocytes Relative: 10 %
Neutro Abs: 2.6 10*3/uL (ref 1.7–7.7)
Neutrophils Relative %: 67 %
Platelet Count: 173 10*3/uL (ref 150–400)
RBC: 5.23 MIL/uL (ref 4.22–5.81)
RDW: 13.1 % (ref 11.5–15.5)
WBC Count: 3.9 10*3/uL — ABNORMAL LOW (ref 4.0–10.5)
nRBC: 0 % (ref 0.0–0.2)

## 2021-10-21 MED ORDER — DOXORUBICIN HCL LIPOSOMAL CHEMO INJECTION 2 MG/ML
27.0000 mg/m2 | Freq: Once | INTRAVENOUS | Status: AC
  Administered 2021-10-21: 60 mg via INTRAVENOUS
  Filled 2021-10-21: qty 30

## 2021-10-21 MED ORDER — HEPARIN SOD (PORK) LOCK FLUSH 100 UNIT/ML IV SOLN
500.0000 [IU] | Freq: Once | INTRAVENOUS | Status: AC | PRN
  Administered 2021-10-21: 500 [IU]

## 2021-10-21 MED ORDER — SODIUM CHLORIDE 0.9% FLUSH
10.0000 mL | Freq: Once | INTRAVENOUS | Status: AC
  Administered 2021-10-21: 10 mL

## 2021-10-21 MED ORDER — SODIUM CHLORIDE 0.9 % IV SOLN
10.0000 mg | Freq: Once | INTRAVENOUS | Status: AC
  Administered 2021-10-21: 10 mg via INTRAVENOUS
  Filled 2021-10-21: qty 10

## 2021-10-21 MED ORDER — SODIUM CHLORIDE 0.9% FLUSH
10.0000 mL | INTRAVENOUS | Status: DC | PRN
  Administered 2021-10-21: 10 mL

## 2021-10-21 MED ORDER — DEXTROSE 5 % IV SOLN: Freq: Once | INTRAVENOUS | Status: AC

## 2021-10-21 NOTE — Progress Notes (Signed)
Hematology and Oncology Follow Up Visit ? ?Austin [name] ?[id number] ?[birth year] 66 y.o. ?10/21/2021 10:18 AM ?[name], MDJohn, [name], MD  ? 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 ? ?Past therapy:  ?Doxil chemotherapy given at 30 mg/m? every 4 weeks the first cycle given at [facility] in June 2016.   ?He completed 6 cycles of therapy in November 2016. ?He developed relapsed disease in September 2019. Doxil 30 mg/m? every 4 weeks restarted in September 2019.  He completed 6 months of therapy. ? ?He developed relapsed disease February 2022 with increased skin lesions on his lower extremities and drainage. ? ? ?Doxil 60 mg total dose every 4 weeks restarted on September 13, 2020.  Last infusion given in August 2022. ? ? ?Current therapy: Doxil 60 mg total dose every 3 months started in December 2022.  She is here for the next cycle of therapy. ? ?Interim History:  Austin [name] is here for a follow-up visit.  Since last visit, he reports feeling well without any major complaints.  He tolerated Doxil without any issues or complications.  His performance status and quality of life remain excellent.  He denies any worsening skin lesions or exacerbation of his Kaposi's lesions. ? ? ? ? ? ? ?Medications: Updated on review. ?Current Outpatient Medications  ?Medication Sig Dispense Refill  ? fenofibrate 160 MG tablet Take 1 tablet (160 mg total) by mouth daily. 90 tablet 3  ? ferrous sulfate 325 (65 FE) MG tablet Take 1 tablet (325 mg total) by mouth daily with breakfast. 30 tablet 3  ? furosemide (LASIX) 20 MG tablet Take 1 tablet daily x 3 days, then stop. And repeat if swelling recurs. 30 tablet 5  ? lidocaine-prilocaine (EMLA) cream Apply 1 application topically as needed. 30 g 0  ? losartan (COZAAR) 25 MG tablet Take 1 tablet (25 mg total) by mouth daily. 90 tablet 3  ? metFORMIN  (GLUCOPHAGE-XR) 500 MG 24 hr tablet Take 2 tablets (1,000 mg total) by mouth daily with breakfast. 180 tablet 3  ? metoprolol (TOPROL-XL) 200 MG 24 hr tablet TAKE 1 TABLET DAILY 90 tablet 3  ? Multiple Vitamin (MULTIVITAMIN) tablet Take 1 tablet by mouth daily.    ? rosuvastatin (CRESTOR) 40 MG tablet TAKE 1 TABLET DAILY 90 tablet 3  ? warfarin (COUMADIN) 5 MG tablet TAKE ONE TABLET DAILY EXCEPT TAKE ONE AND ONE-HALF TABLETS ON WEDNESDAYS OR TAKE AS DIRECTED BY ANTICOAGULATION CLINIC 120 tablet 3  ? ?No current facility-administered medications for this visit.  ? ? ? ?Allergies:  ?Allergies  ?Allergen Reactions  ? Celecoxib Itching and Other (See Comments)  ?  Joint pain  ? ? ? ? ? ?Physical Exam: ? ? ? ? ? ? ? ? ?Blood pressure 119/75, pulse 71, temperature 97.8 ?F (36.6 ?C), temperature source Temporal, resp. rate 15, height '5\' 8"'$  (1.727 m), weight 196 lb 12.8 oz (89.3 kg), SpO2 100 %. ? ? ? ? ? ? ? ? ? ?ECOG: 0 ? ? ? ?General appearance: Alert, awake without any distress. ?Head: Atraumatic without abnormalities ?Oropharynx: Without any thrush or ulcers. ?Eyes: No scleral icterus. ?Lymph nodes: No lymphadenopathy noted in the cervical, supraclavicular, or axillary nodes ?Heart:regular rate and rhythm, without any murmurs or gallops.   ?Lung: Clear to auscultation without any rhonchi, wheezes or dullness to percussion. ?Abdomin: Soft, nontender without any shifting dullness or ascites. ?Musculoskeletal:  No clubbing or cyanosis. ?Neurological: No motor or sensory deficits. ?Skin: Very dry skin noted on his extremities bilaterally.  No raised or breakthrough lesions noted. ? ? ? ? ? ? ? ? ? ? ? ?Lab Results: ?Lab Results  ?Component Value Date  ? WBC 4.1 10/11/2021  ? HGB 15.3 10/11/2021  ? HCT 44.3 10/11/2021  ? MCV 82.7 10/11/2021  ? PLT 155.0 10/11/2021  ? ?  Chemistry   ?   ?Component Value Date/Time  ? NA 130 (L) 10/11/2021 1656  ? NA 141 07/08/2021 1029  ? NA 143 11/03/2016 0908  ? K 4.3 10/11/2021 1656  ? K  5.0 11/03/2016 0908  ? CL 95 (L) 10/11/2021 1656  ? CO2 27 10/11/2021 1656  ? CO2 27 11/03/2016 0908  ? BUN 27 (H) 10/11/2021 1656  ? BUN 31 (H) 07/08/2021 1029  ? BUN 33.5 (H) 11/03/2016 0908  ? CREATININE 1.57 (H) 10/11/2021 1656  ? CREATININE 1.63 (H) 07/19/2021 1309  ? CREATININE 1.5 (H) 11/03/2016 0908  ?    ?Component Value Date/Time  ? CALCIUM 9.3 10/11/2021 1656  ? CALCIUM 9.5 11/03/2016 0908  ? ALKPHOS 42 10/11/2021 1656  ? ALKPHOS 25 (L) 11/03/2016 0908  ? AST 30 10/11/2021 1656  ? AST 28 07/19/2021 1309  ? AST 29 11/03/2016 0908  ? ALT 32 10/11/2021 1656  ? ALT 29 07/19/2021 1309  ? ALT 29 11/03/2016 0908  ? BILITOT 0.9 10/11/2021 1656  ? BILITOT 0.5 07/19/2021 1309  ? BILITOT 0.70 11/03/2016 0908  ?  ? ? ?Impression and Plan: ? ? ?66 year old with ? ?1.  Relapsed extensive Kaposi's sarcoma initially diagnosed in 2016. ? ? ?The natural course of this disease was reviewed at this time and treatment choices were discussed.  Risks and benefits of continuing Doxil were reiterated at this time.  He appears to have tolerated this treatment well with excellent response to his Kaposi's lesions.  I recommended that continuing the same dose and schedule every 3 months maintenance.  This can be changed in the future accordingly. ? ? ?2. Cardiomyopathy: No clinical signs or symptoms for worsening heart failure.  We will continue to monitor and repeat echo if needed. ? ? ?3.  IV access: Port-A-Cath currently in use without any complications. ? ? ?4. Follow-up: He will return in 3 months for a follow-up visit and the next Doxil infusion. ? ? ?30  minutes were spent on this visit.  Time was dedicated to reviewing disease status, treatment choices and complications related to his cancer and cancer therapy. ? ? ? ?Zola Button, MD ?3/24/202310:18 AM ?

## 2021-10-21 NOTE — Patient Instructions (Signed)
[facility]ICAL [facility]COLOG[facility]  Discharge Instructions: Thank you for choosing [facility] to provide your oncology and hematology care.   If you have a lab appointment with the Cancer Center, please go directly to the Cancer Center and check in at the registration area.   Wear comfortable clothing and clothing appropriate for easy access to any Portacath or PICC line.   We strive to give you quality time with your provider. You may need to reschedule your appointment if you arrive late (15 or more minutes).  Arriving late affects you and other patients whose appointments are after yours.  Also, if you miss three or more appointments without notifying the office, you may be dismissed from the clinic at the provider's discretion.      For prescription refill requests, have your pharmacy contact our office and allow 72 hours for refills to be completed.    Today you received the following chemotherapy and/or immunotherapy agent: Doxil      To help prevent nausea and vomiting after your treatment, we encourage you to take your nausea medication as directed.  BELOW ARE SYMPTOMS THAT SHOULD BE REPORTED IMMEDIATELY: *FEVER GREATER THAN 100.4 F (38 C) OR HIGHER *CHILLS OR SWEATING *NAUSEA AND VOMITING THAT IS NOT CONTROLLED WITH YOUR NAUSEA MEDICATION *UNUSUAL SHORTNESS OF BREATH *UNUSUAL BRUISING OR BLEEDING *URINARY PROBLEMS (pain or burning when urinating, or frequent urination) *BOWEL PROBLEMS (unusual diarrhea, constipation, pain near the anus) TENDERNESS IN MOUTH AND THROAT WITH OR WITHOUT PRESENCE OF ULCERS (sore throat, sores in mouth, or a toothache) UNUSUAL RASH, SWELLING OR PAIN  UNUSUAL VAGINAL DISCHARGE OR ITCHING   Items with * indicate a potential emergency and should be followed up as soon as possible or go to the Emergency Department if any problems should occur.  Please show the CHEMOTHERAPY ALERT CARD or IMMUNOTHERAPY ALERT CARD at check-in to the  Emergency Department and triage nurse.  Should you have questions after your visit or need to cancel or reschedule your appointment, please contact [facility]  Dept: [phone]  and follow the prompts.  Office hours are 8:00 a.m. to 4:30 p.m. Monday - Friday. Please note that voicemails left after 4:00 p.m. may not be returned until the following business day.  We are closed weekends and major holidays. You have access to a nurse at all times for urgent questions. Please call the main number to the clinic Dept: [phone] and follow the prompts.   For any non-urgent questions, you may also contact your provider using MyChart. We now offer e-Visits for anyone 18 and older to request care online for non-urgent symptoms. For details visit [email or web address].   Also download the MyChart app! Go to the app store, search "MyChart", open the app, select [facility], and log in with your MyChart username and password.  Due to Covid, a mask is required upon entering the hospital/clinic. If you do not have a mask, one will be given to you upon arrival. For doctor visits, patients may have 1 support person aged 18 or older with them. For treatment visits, patients cannot have anyone with them due to current Covid guidelines and our immunocompromised population.  

## 2021-10-25 ENCOUNTER — Ambulatory Visit: Payer: BC Managed Care – PPO

## 2021-10-28 ENCOUNTER — Ambulatory Visit: Payer: BC Managed Care – PPO

## 2021-10-28 DIAGNOSIS — Z7901 Long term (current) use of anticoagulants: Secondary | ICD-10-CM

## 2021-10-28 LAB — POCT INR: INR: 1.5 — AB (ref 2.0–3.0)

## 2021-10-28 NOTE — Progress Notes (Signed)
Pt started metformin 2 weeks ago and it has an interaction with warfarin, decreasing effects of warfarin. So a boost dose and a change in weekly dosing will be made. ?Increase dose today to take 1 tablet and then change weekly dose to take 1 tablet daily except take 1/2 tablet on Fridays. Recheck in 3 weeks per pt request.  ?

## 2021-10-28 NOTE — Patient Instructions (Addendum)
Pre visit review using our clinic review tool, if applicable. No additional management support is needed unless otherwise documented below in the visit note.  Increase dose today to take 1 tablet and then change weekly dose to take 1 tablet daily except take 1/2 tablet on Fridays. Recheck in 3 weeks.  

## 2021-10-28 NOTE — Progress Notes (Signed)
Patient ID: Austin [name], male   DOB: September 22, 1955, 66 y.o.   MRN: [id number] ?Medical screening examination/treatment/procedure(s) were performed by non-physician practitioner and as supervising physician I was immediately available for consultation/collaboration.  I agree with above. Cathlean Cower, MD ? ?

## 2021-11-04 ENCOUNTER — Other Ambulatory Visit: Payer: Self-pay | Admitting: Internal Medicine

## 2021-11-04 NOTE — Telephone Encounter (Signed)
Please refill as per office routine med refill policy (all routine meds to be refilled for 3 mo or monthly (per pt preference) up to one year from last visit, then month to month grace period for 3 mo, then further med refills will have to be denied) ? ?

## 2021-11-14 ENCOUNTER — Other Ambulatory Visit: Payer: Self-pay | Admitting: Internal Medicine

## 2021-11-14 DIAGNOSIS — Z7901 Long term (current) use of anticoagulants: Secondary | ICD-10-CM

## 2021-11-14 NOTE — Telephone Encounter (Signed)
Please to let pt know that he should reqeust refill per his coumadin clinic - thanks ?

## 2021-11-15 NOTE — Telephone Encounter (Signed)
Pt is compliant with warfarin management and PCP apts. ?Sent in refill.  ?

## 2021-11-18 ENCOUNTER — Ambulatory Visit: Payer: BC Managed Care – PPO

## 2021-11-18 DIAGNOSIS — Z7901 Long term (current) use of anticoagulants: Secondary | ICD-10-CM

## 2021-11-18 LAB — POCT INR: INR: 1.7 — AB (ref 2.0–3.0)

## 2021-11-18 NOTE — Progress Notes (Signed)
Increase dose today to take 1 tablet and then change weekly dose to take 1 tablet daily. Recheck in 3 weeks. ?

## 2021-11-18 NOTE — Patient Instructions (Addendum)
Pre visit review using our clinic review tool, if applicable. No additional management support is needed unless otherwise documented below in the visit note. ? ?Increase dose today to take 1 tablet and then change weekly dose to take 1 tablet daily. Recheck in 3 weeks. ?

## 2021-11-18 NOTE — Progress Notes (Signed)
Patient ID: Austin [name], male   DOB: Feb 08, 1956, 66 y.o.   MRN: [id number] ? ?Medical screening examination/treatment/procedure(s) were performed by non-physician practitioner and as supervising physician I was immediately available for consultation/collaboration.  I agree with above. Cathlean Cower, MD ? ?

## 2021-11-24 DIAGNOSIS — H35033 Hypertensive retinopathy, bilateral: Secondary | ICD-10-CM | POA: Diagnosis not present

## 2021-11-24 DIAGNOSIS — H35373 Puckering of macula, bilateral: Secondary | ICD-10-CM | POA: Diagnosis not present

## 2021-11-24 DIAGNOSIS — H43813 Vitreous degeneration, bilateral: Secondary | ICD-10-CM | POA: Diagnosis not present

## 2021-11-24 DIAGNOSIS — H43822 Vitreomacular adhesion, left eye: Secondary | ICD-10-CM | POA: Diagnosis not present

## 2021-11-29 ENCOUNTER — Encounter: Payer: BC Managed Care – PPO | Attending: Internal Medicine | Admitting: Dietician

## 2021-11-29 ENCOUNTER — Encounter: Payer: Self-pay | Admitting: Dietician

## 2021-11-29 DIAGNOSIS — E1165 Type 2 diabetes mellitus with hyperglycemia: Secondary | ICD-10-CM | POA: Diagnosis not present

## 2021-11-29 NOTE — Patient Instructions (Addendum)
Contact your PCP to request a prescription for a glucometer, strips, and lancets. ? ?Always clean hands with warm, soapy water or an alcohol pad before checking. ?Use a new lancet for each test. ?Prick on the side of the finger, near the tip beside your finger nail. If blood is slow to come out, massage finger in a downward motion to increase amount of blood.  ?If sample is too small doesn't come out at all, increase the depth number on your lancing device. ?Insert strip into meter and be sure meter is ready to test. ?Touch the strip to the blood until it sucks the blood in fills the sample area.  ?NEVER TRY TO PUT BLOOD ONTO THE STRIP. ?Dispose of used lancets in a hard plastic container. Once full, cap the container, duct tape the cap shut, and dispose of it in the regular trash. ? ? ?Check your blood sugar each morning before eating or drinking (fasting). ?Look for numbers between 70-100 mg/dL ?Check your blood sugar 2 hours after you begin eating a meal. ?Look for numbers under 150 mg/dL. ?Your goal A1c is below 7.0% ? ? ?Begin to build your meals using the proportions of the Balanced Plate. ?First, select your carb choice(s) for the meal. Make this 25% of your meal. ?Next, select your source of protein to pair with your carb choice(s). Make this another 25% of your meal. ?Finally, complete your meal with a variety of non-starchy vegetables. Make this the remaining 50% of your meal. ? ? ?

## 2021-11-29 NOTE — Progress Notes (Signed)
Diabetes Self-Management Education ? ?Visit Type: First/Initial ? ?Appt. Start Time: 1115 Appt. End Time: 1220 ? ?11/29/2021 ? ?[name], identified by name and date of birth, is a 66 y.o. male with a diagnosis of Diabetes: Type 2.  ? ?ASSESSMENT ?Pt reports losing about 20 pounds since the new year unintentionally.  ?Pt A1c has risen dramatically from 6.7 to 13.6 in the last 11 months. 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 Pt initially was treated with monthly chemo for 8 months in 2022. Pt has moved to quarterly treatments, pt has been getting a dexamethasone steroid injection prior to each treatment. ?Pt is taking metformin QD, reports light tingling in their feet that started around the time they started metformin. Pt reports history of warfarin use for the past 20 years, very familiar with food sources of Vitamin K. ?RD provided sample glucometer and training on proper use. ?AccuChek GuideMe ?Lot#: [id number] ?Exp Date: 11/16/2022 ? ?Height '5\' 8"'$  (1.727 m), weight 200 lb 1.6 oz (90.8 kg). ?Body mass index is 30.43 kg/m?. ? ? Diabetes Self-Management Education - 11/29/21 1132   ? ?  ? Visit Information  ? Visit Type First/Initial   ?  ? Initial Visit  ? Diabetes Type Type 2   ? Date Diagnosed March 2023   ? Are you currently following a meal plan? No   ? Are you taking your medications as prescribed? Yes   ?  ? Health Coping  ? How would you rate your overall health? Good   ?  ? Psychosocial Assessment  ? Patient Belief/Attitude about Diabetes Other (comment)   Meh, not concerned  ? What is the hardest part about your diabetes right now, causing you the most concern, or is the most worrisome to you about your diabetes?   Checking blood sugar   ? Self-management support Doctor's office   ? Other persons present Patient   ? Patient Concerns Glycemic Control   ? Special Needs None   ? Preferred Learning Style No preference indicated   ? Learning Readiness Ready   ? How  often do you need to have someone help you when you read instructions, pamphlets, or other written materials from your doctor or pharmacy? 1 - Never   ? What is the last grade level you completed in school? Masters   ?  ? Pre-Education Assessment  ? Patient understands the diabetes disease and treatment process. Needs Instruction   ? Patient understands incorporating nutritional management into lifestyle. Needs Instruction   ? Patient undertands incorporating physical activity into lifestyle. Needs Instruction   ? Patient understands using medications safely. Needs Instruction   ? Patient understands monitoring blood glucose, interpreting and using results Needs Instruction   ? Patient understands prevention, detection, and treatment of acute complications. Needs Instruction   ? Patient understands prevention, detection, and treatment of chronic complications. Needs Instruction   ? Patient understands how to develop strategies to address psychosocial issues. Needs Instruction   ? Patient understands how to develop strategies to promote health/change behavior. Needs Instruction   ?  ? Complications  ? Last HgB A1C per patient/outside source 13.6 %   10/11/2021  ? How often do you check your blood sugar? 0 times/day (not testing)   ? Postprandial Blood glucose range (mg/dL) 70-129   Checked CBG during visit - 91 mg/dL  ? Number of hypoglycemic episodes per month 0   ? Number of hyperglycemic episodes ( >'200mg'$ /dL): Never   ?  Can you tell when your blood sugar is high? No   ? Have you had a dilated eye exam in the past 12 months? Yes   ? Have you had a dental exam in the past 12 months? Yes   ? Are you checking your feet? No   ?  ? Dietary Intake  ? Breakfast english muffin, ham, small OJ   ? Lunch Chili w/ rice and cheese, water   ? Dinner Chicken breast, mashed potatoes, water, 1 beer   ? Beverage(s) water, beer, OJ   ?  ? Activity / Exercise  ? Activity / Exercise Type ADL's;Light (walking / raking leaves)   ? How  many days per week do you exercise? 7   ? How many minutes per day do you exercise? 30   ? Total minutes per week of exercise 210   ?  ? Patient Education  ? Previous Diabetes Education No   ? Disease Pathophysiology Factors that contribute to the development of diabetes;Explored patient's options for treatment of their diabetes   ? Healthy Eating Role of diet in the treatment of diabetes and the relationship between the three main macronutrients and blood glucose level   ? Being Active Role of exercise on diabetes management, blood pressure control and cardiac health.   ? Medications Reviewed patients medication for diabetes, action, purpose, timing of dose and side effects.   ? Monitoring Taught/evaluated SMBG meter.;Identified appropriate SMBG and/or A1C goals.   ? Acute complications Discussed and identified patients' prevention, symptoms, and treatment of hyperglycemia.   ? Chronic complications Relationship between chronic complications and blood glucose control;Assessed and discussed foot care and prevention of foot problems   ? Diabetes Stress and Support Role of stress on diabetes;Identified and addressed patients feelings and concerns about diabetes   ?  ? Individualized Goals (developed by patient)  ? Nutrition Follow meal plan discussed   ? Physical Activity Exercise 3-5 times per week   ? Medications take my medication as prescribed   ? Monitoring  Test my blood glucose as discussed   ?  ? Post-Education Assessment  ? Patient understands the diabetes disease and treatment process. Needs Review   ? Patient understands incorporating nutritional management into lifestyle. Needs Review   ? Patient undertands incorporating physical activity into lifestyle. Needs Review   ? Patient understands using medications safely. Demonstrates understanding / competency   ? Patient understands monitoring blood glucose, interpreting and using results Comprehends key points   ? Patient understands prevention, detection,  and treatment of acute complications. Comprehends key points   ? Patient understands prevention, detection, and treatment of chronic complications. Needs Review   ? Patient understands how to develop strategies to address psychosocial issues. Needs Review   ? Patient understands how to develop strategies to promote health/change behavior. Needs Review   ?  ? Outcomes  ? Expected Outcomes Demonstrated interest in learning. Expect positive outcomes   ? Future DMSE PRN   ? Program Status Completed   ? ?  ?  ? ?  ? ? ?Individualized Plan for Diabetes Self-Management Training:  ? ?Learning Objective:  Patient will have a greater understanding of diabetes self-management. ?Patient education plan is to attend individual and/or group sessions per assessed needs and concerns. ?  ?Plan:  ? ?Patient Instructions  ?Contact your PCP to request a prescription for a glucometer, strips, and lancets. ? ?Always clean hands with warm, soapy water or an alcohol pad before checking. ?Use a new lancet  for each test. ?Prick on the side of the finger, near the tip beside your finger nail. If blood is slow to come out, massage finger in a downward motion to increase amount of blood.  ?If sample is too small doesn't come out at all, increase the depth number on your lancing device. ?Insert strip into meter and be sure meter is ready to test. ?Touch the strip to the blood until it sucks the blood in fills the sample area.  ?NEVER TRY TO PUT BLOOD ONTO THE STRIP. ?Dispose of used lancets in a hard plastic container. Once full, cap the container, duct tape the cap shut, and dispose of it in the regular trash. ? ? ?Check your blood sugar each morning before eating or drinking (fasting). ?Look for numbers between 70-100 mg/dL ?Check your blood sugar 2 hours after you begin eating a meal. ?Look for numbers under 150 mg/dL. ?Your goal A1c is below 7.0% ? ? ?Begin to build your meals using the proportions of the Balanced Plate. ?First, select your  carb choice(s) for the meal. Make this 25% of your meal. ?Next, select your source of protein to pair with your carb choice(s). Make this another 25% of your meal. ?Finally, complete your meal with a variety o

## 2021-11-30 ENCOUNTER — Encounter: Payer: Self-pay | Admitting: Internal Medicine

## 2021-12-02 MED ORDER — ONETOUCH ULTRA 2 W/DEVICE KIT: PACK | 0 refills | Status: DC

## 2021-12-02 MED ORDER — ONETOUCH ULTRASOFT LANCETS MISC: 0 refills | Status: DC

## 2021-12-02 MED ORDER — ONETOUCH ULTRA VI STRP: ORAL_STRIP | 0 refills | Status: DC

## 2021-12-06 ENCOUNTER — Ambulatory Visit: Payer: BC Managed Care – PPO

## 2021-12-06 DIAGNOSIS — Z7901 Long term (current) use of anticoagulants: Secondary | ICD-10-CM

## 2021-12-06 LAB — POCT INR: INR: 2.1 (ref 2.0–3.0)

## 2021-12-06 NOTE — Progress Notes (Signed)
Continue 1 tablet daily. Recheck in 4 weeks.   

## 2021-12-06 NOTE — Patient Instructions (Addendum)
Pre visit review using our clinic review tool, if applicable. No additional management support is needed unless otherwise documented below in the visit note.  Continue 1 tablet daily . Recheck in 4 weeks.  

## 2021-12-07 ENCOUNTER — Other Ambulatory Visit: Payer: Self-pay

## 2021-12-07 MED ORDER — METFORMIN HCL ER 500 MG PO TB24: 1000.0000 mg | ORAL_TABLET | Freq: Every day | ORAL | 3 refills | Status: DC

## 2022-01-03 ENCOUNTER — Ambulatory Visit: Payer: BC Managed Care – PPO

## 2022-01-03 DIAGNOSIS — Z7901 Long term (current) use of anticoagulants: Secondary | ICD-10-CM

## 2022-01-03 LAB — POCT INR: INR: 1.9 — AB (ref 2.0–3.0)

## 2022-01-03 NOTE — Patient Instructions (Addendum)
Pre visit review using our clinic review tool, if applicable. No additional management support is needed unless otherwise documented below in the visit note.  Increase dose today to take 1 1/2 tablets and then change weekly dose to take 1 tablet daily except take 1 1/2 on Wednesdays. Recheck in 3 weeks.

## 2022-01-03 NOTE — Progress Notes (Signed)
Increase dose today to take 1 1/2 tablets and then change weekly dose to take 1 tablet daily except take 1 1/2 on Wednesdays. Recheck in 3 weeks.

## 2022-01-06 ENCOUNTER — Telehealth: Payer: Self-pay

## 2022-01-06 NOTE — Telephone Encounter (Signed)
Pt is needing new test strips to use with new meter.  Meter is Onetouch verio.  Pharmacy: [place], [place]  Please advise

## 2022-01-10 MED ORDER — ONETOUCH VERIO VI STRP: ORAL_STRIP | 12 refills | Status: DC

## 2022-01-10 NOTE — Telephone Encounter (Signed)
Done erx 

## 2022-01-20 ENCOUNTER — Other Ambulatory Visit: Payer: BC Managed Care – PPO

## 2022-01-20 ENCOUNTER — Ambulatory Visit: Payer: BC Managed Care – PPO

## 2022-01-20 ENCOUNTER — Ambulatory Visit: Payer: BC Managed Care – PPO | Admitting: Oncology

## 2022-01-24 ENCOUNTER — Ambulatory Visit: Payer: BC Managed Care – PPO

## 2022-01-24 DIAGNOSIS — Z7901 Long term (current) use of anticoagulants: Secondary | ICD-10-CM | POA: Diagnosis not present

## 2022-01-24 LAB — POCT INR: INR: 2.6 (ref 2.0–3.0)

## 2022-02-08 ENCOUNTER — Inpatient Hospital Stay: Payer: BC Managed Care – PPO | Attending: Oncology

## 2022-02-08 ENCOUNTER — Inpatient Hospital Stay: Payer: BC Managed Care – PPO | Admitting: Oncology

## 2022-02-08 ENCOUNTER — Other Ambulatory Visit: Payer: Self-pay

## 2022-02-08 ENCOUNTER — Inpatient Hospital Stay: Payer: BC Managed Care – PPO

## 2022-02-08 VITALS — BP 131/77 | HR 59 | Temp 97.7°F | Resp 18 | Ht 68.0 in | Wt 203.0 lb

## 2022-02-08 DIAGNOSIS — G629 Polyneuropathy, unspecified: Secondary | ICD-10-CM | POA: Diagnosis not present

## 2022-02-08 DIAGNOSIS — R972 Elevated prostate specific antigen [PSA]: Secondary | ICD-10-CM

## 2022-02-08 DIAGNOSIS — C469 Kaposi's sarcoma, unspecified: Secondary | ICD-10-CM | POA: Diagnosis not present

## 2022-02-08 DIAGNOSIS — I429 Cardiomyopathy, unspecified: Secondary | ICD-10-CM | POA: Diagnosis not present

## 2022-02-08 DIAGNOSIS — Z5111 Encounter for antineoplastic chemotherapy: Secondary | ICD-10-CM | POA: Diagnosis not present

## 2022-02-08 DIAGNOSIS — Z95828 Presence of other vascular implants and grafts: Secondary | ICD-10-CM

## 2022-02-08 DIAGNOSIS — R739 Hyperglycemia, unspecified: Secondary | ICD-10-CM | POA: Insufficient documentation

## 2022-02-08 LAB — CBC WITH DIFFERENTIAL (CANCER CENTER ONLY)
Abs Immature Granulocytes: 0 10*3/uL (ref 0.00–0.07)
Basophils Absolute: 0 10*3/uL (ref 0.0–0.1)
Basophils Relative: 1 %
Eosinophils Absolute: 0.2 10*3/uL (ref 0.0–0.5)
Eosinophils Relative: 4 %
HCT: 40.2 % (ref 39.0–52.0)
Hemoglobin: 13.9 g/dL (ref 13.0–17.0)
Immature Granulocytes: 0 %
Lymphocytes Relative: 21 %
Lymphs Abs: 0.9 10*3/uL (ref 0.7–4.0)
MCH: 29 pg (ref 26.0–34.0)
MCHC: 34.6 g/dL (ref 30.0–36.0)
MCV: 83.9 fL (ref 80.0–100.0)
Monocytes Absolute: 0.3 10*3/uL (ref 0.1–1.0)
Monocytes Relative: 8 %
Neutro Abs: 2.6 10*3/uL (ref 1.7–7.7)
Neutrophils Relative %: 66 %
Platelet Count: 165 10*3/uL (ref 150–400)
RBC: 4.79 MIL/uL (ref 4.22–5.81)
RDW: 13 % (ref 11.5–15.5)
WBC Count: 4 10*3/uL (ref 4.0–10.5)
nRBC: 0 % (ref 0.0–0.2)

## 2022-02-08 LAB — CMP (CANCER CENTER ONLY)
ALT: 22 U/L (ref 0–44)
AST: 25 U/L (ref 15–41)
Albumin: 4.1 g/dL (ref 3.5–5.0)
Alkaline Phosphatase: 22 U/L — ABNORMAL LOW (ref 38–126)
Anion gap: 5 (ref 5–15)
BUN: 23 mg/dL (ref 8–23)
CO2: 27 mmol/L (ref 22–32)
Calcium: 9.4 mg/dL (ref 8.9–10.3)
Chloride: 109 mmol/L (ref 98–111)
Creatinine: 1.38 mg/dL — ABNORMAL HIGH (ref 0.61–1.24)
GFR, Estimated: 56 mL/min — ABNORMAL LOW (ref >60)
Glucose, Bld: 201 mg/dL — ABNORMAL HIGH (ref 70–99)
Potassium: 4.2 mmol/L (ref 3.5–5.1)
Sodium: 141 mmol/L (ref 135–145)
Total Bilirubin: 0.6 mg/dL (ref 0.3–1.2)
Total Protein: 6.2 g/dL — ABNORMAL LOW (ref 6.5–8.1)

## 2022-02-08 MED ORDER — DEXTROSE 5 % IV SOLN: Freq: Once | INTRAVENOUS | Status: AC

## 2022-02-08 MED ORDER — DOXORUBICIN HCL LIPOSOMAL CHEMO INJECTION 2 MG/ML
27.0000 mg/m2 | Freq: Once | INTRAVENOUS | Status: AC
  Administered 2022-02-08: 60 mg via INTRAVENOUS
  Filled 2022-02-08: qty 30

## 2022-02-08 MED ORDER — SODIUM CHLORIDE 0.9% FLUSH
10.0000 mL | INTRAVENOUS | Status: DC | PRN
  Administered 2022-02-08: 10 mL

## 2022-02-08 MED ORDER — PROCHLORPERAZINE MALEATE 10 MG PO TABS
10.0000 mg | ORAL_TABLET | Freq: Four times a day (QID) | ORAL | Status: DC | PRN
  Administered 2022-02-08: 10 mg via ORAL
  Filled 2022-02-08: qty 1

## 2022-02-08 MED ORDER — HEPARIN SOD (PORK) LOCK FLUSH 100 UNIT/ML IV SOLN
500.0000 [IU] | Freq: Once | INTRAVENOUS | Status: AC | PRN
  Administered 2022-02-08: 500 [IU]

## 2022-02-08 MED ORDER — SODIUM CHLORIDE 0.9% FLUSH
10.0000 mL | Freq: Once | INTRAVENOUS | Status: AC
  Administered 2022-02-08: 10 mL

## 2022-02-08 NOTE — Progress Notes (Signed)
Hematology and Oncology Follow Up Visit  Austin [name] [id number] 01-01-1956 66 y.o. 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  He developed relapsed disease in 2022.     Past therapy:  Doxil chemotherapy given at 30 mg/m every 4 weeks the first cycle given at [facility] in June 2016.   He completed 6 cycles of therapy in November 2016. He developed relapsed disease in September 2019. Doxil 30 mg/m every 4 weeks restarted in September 2019.  He completed 6 months of therapy.  He developed relapsed disease February 2022 with increased skin lesions on his lower extremities and drainage.   Doxil 60 mg total dose every 4 weeks restarted on September 13, 2020.  Last infusion given in August 2022.   Current therapy: Doxil 60 mg total dose every 3 months started in December 2022.  He returns for the next cycle of therapy.  Interim History:  Austin [name] is here for a follow-up evaluation.  Since the last visit, he reports feeling well without any major complaints.  He did have episodes of hyperglycemia associated with dexamethasone premedication.  He denies any nausea, vomiting or allergic reaction related to Doxil.  He denies any skin rashes or lesions.  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       Medications: Reviewed without changes. Current Outpatient Medications  Medication Sig Dispense Refill   fenofibrate 160 MG tablet TAKE 1 TABLET DAILY 90 tablet 1   ferrous sulfate 325 (65 FE) MG tablet Take 1 tablet (325 mg total) by mouth daily with breakfast. 30 tablet 3   furosemide (LASIX) 20 MG tablet Take 1 tablet daily x 3 days, then stop. And repeat if swelling recurs. 30 tablet 5   glucose blood (ONETOUCH VERIO) test strip Use as instructed once daily E11.9 100 each 12   lidocaine-prilocaine (EMLA) cream Apply 1  application topically as needed. 30 g 0   losartan (COZAAR) 25 MG tablet TAKE 1 TABLET DAILY 90 tablet 1   metFORMIN (GLUCOPHAGE-XR) 500 MG 24 hr tablet Take 2 tablets (1,000 mg total) by mouth daily with breakfast. 180 tablet 3   metoprolol (TOPROL-XL) 200 MG 24 hr tablet TAKE 1 TABLET DAILY 90 tablet 1   Multiple Vitamin (MULTIVITAMIN) tablet Take 1 tablet by mouth daily.     rosuvastatin (CRESTOR) 40 MG tablet TAKE 1 TABLET DAILY 90 tablet 1   warfarin (COUMADIN) 5 MG tablet TAKE 1 TABLET BY MOUTH DAILY EXCEPT, TAKE ONE AND ONE-HALF TABLETS ON FRIDAYS OR TAKE AS DIRECTED BY ANTICOAGULATION CLINIC 100 tablet 3   No current facility-administered medications for this visit.     Allergies:  Allergies  Allergen Reactions   Celecoxib Itching and Other (See Comments)    Joint pain       Physical Exam:             Blood pressure 131/77, pulse (!) 59, temperature 97.7 F (36.5 C), temperature source Temporal, resp. rate 18, height '5\' 8"'$  (1.727 m), weight 203 lb (92.1 kg), SpO2 100 %.       ECOG: 0     General appearance: Comfortable appearing without any discomfort Head: Normocephalic without any trauma Oropharynx: Mucous membranes are moist and pink without any thrush or ulcers. Eyes: Pupils are equal and round reactive to light. Lymph nodes: No cervical, supraclavicular, inguinal or axillary lymphadenopathy.  Heart:regular rate and rhythm.  S1 and S2 without leg edema. Lung: Clear without any rhonchi or wheezes.  No dullness to percussion. Abdomin: Soft, nontender, nondistended with good bowel sounds.  No hepatosplenomegaly. Musculoskeletal: No joint deformity or effusion.  Full range of motion noted. Neurological: No deficits noted on motor, sensory and deep tendon reflex exam. Skin: Well-healed scars noted on his bilateral lower extremity.           Lab Results: Lab Results  Component Value Date   WBC 3.9 (L) 10/21/2021   HGB 15.0 10/21/2021    HCT 42.5 10/21/2021   MCV 81.3 10/21/2021   PLT 173 10/21/2021   PSA 1.57 10/11/2021     Chemistry      Component Value Date/Time   NA 134 (L) 10/21/2021 1006   NA 141 07/08/2021 1029   NA 143 11/03/2016 0908   K 4.5 10/21/2021 1006   K 5.0 11/03/2016 0908   CL 102 10/21/2021 1006   CO2 27 10/21/2021 1006   CO2 27 11/03/2016 0908   BUN 18 10/21/2021 1006   BUN 31 (H) 07/08/2021 1029   BUN 33.5 (H) 11/03/2016 0908   CREATININE 1.44 (H) 10/21/2021 1006   CREATININE 1.5 (H) 11/03/2016 0908      Component Value Date/Time   CALCIUM 9.1 10/21/2021 1006   CALCIUM 9.5 11/03/2016 0908   ALKPHOS 44 10/21/2021 1006   ALKPHOS 25 (L) 11/03/2016 0908   AST 31 10/21/2021 1006   AST 29 11/03/2016 0908   ALT 32 10/21/2021 1006   ALT 29 11/03/2016 0908   BILITOT 0.9 10/21/2021 1006   BILITOT 0.70 11/03/2016 0908      Impression and Plan:   66 year old with  1.  Kaposi's sarcoma diagnosed in 2016.  He developed relapsed disease in 2022.     He is currently on Doxil maintenance therapy every 3 months without any major complications.  Risks and benefits of continuing this treatments were discussed at this time.  Complications that include nausea, vomiting, dermatological toxicity and cardiac dysfunction among others were reiterated.  He is agreeable to continue.  Consideration for increasing the interval between Doxil will be discussed especially if he has any further neuropathy.   2. Cardiomyopathy: No signs or symptoms of heart failure noted at this time.   3.  IV access: Port-A-Cath remains in use without any issues.  4.  Hyperglycemia: We will be limiting dexamethasone premedication given his hyperglycemia.   5. Follow-up: In 3 months for the next cycle of therapy.   30  minutes were dedicated to this encounter.  The time spent on reviewing laboratory data, disease status update and outlining future plan of care discussion.    Austin Button, MD 7/12/20238:01 AM

## 2022-02-08 NOTE — Patient Instructions (Signed)
[facility]  Discharge Instructions: Thank you for choosing [facility] to provide your oncology and hematology care.   If you have a lab appointment with the [facility], please go directly to the [facility] and check in at the registration area.   Wear comfortable clothing and clothing appropriate for easy access to any Portacath or PICC line.   We strive to give you quality time with your provider. You may need to reschedule your appointment if you arrive late (15 or more minutes).  Arriving late affects you and other patients whose appointments are after yours.  Also, if you miss three or more appointments without notifying the office, you may be dismissed from the clinic at the provider's discretion.      For prescription refill requests, have your pharmacy contact our office and allow 72 hours for refills to be completed.    Today you received the following chemotherapy and/or immunotherapy agent: Doxil      To help prevent nausea and vomiting after your treatment, we encourage you to take your nausea medication as directed.  BELOW ARE SYMPTOMS THAT SHOULD BE REPORTED IMMEDIATELY: *FEVER GREATER THAN 100.4 F (38 C) OR HIGHER *CHILLS OR SWEATING *NAUSEA AND VOMITING THAT IS NOT CONTROLLED WITH YOUR NAUSEA MEDICATION *UNUSUAL SHORTNESS OF BREATH *UNUSUAL BRUISING OR BLEEDING *URINARY PROBLEMS (pain or burning when urinating, or frequent urination) *BOWEL PROBLEMS (unusual diarrhea, constipation, pain near the anus) TENDERNESS IN MOUTH AND THROAT WITH OR WITHOUT PRESENCE OF ULCERS (sore throat, sores in mouth, or a toothache) UNUSUAL RASH, SWELLING OR PAIN  UNUSUAL VAGINAL DISCHARGE OR ITCHING   Items with * indicate a potential emergency and should be followed up as soon as possible or go to the Emergency Department if any problems should occur.  Please show the CHEMOTHERAPY ALERT CARD or IMMUNOTHERAPY ALERT CARD at check-in to the  Emergency Department and triage nurse.  Should you have questions after your visit or need to cancel or reschedule your appointment, please contact [facility]  Dept: [phone]  and follow the prompts.  Office hours are 8:00 a.m. to 4:30 p.m. Monday - Friday. Please note that voicemails left after 4:00 p.m. may not be returned until the following business day.  We are closed weekends and major holidays. You have access to a nurse at all times for urgent questions. Please call the main number to the clinic Dept: [phone] and follow the prompts.   For any non-urgent questions, you may also contact your provider using MyChart. We now offer e-Visits for anyone 74 and older to request care online for non-urgent symptoms. For details visit [email or web address].   Also download the MyChart app! Go to the app store, search "MyChart", open the app, select [facility], and log in with your MyChart username and password.  Due to Covid, a mask is required upon entering the hospital/clinic. If you do not have a mask, one will be given to you upon arrival. For doctor visits, patients may have 1 support Austin [name] aged 67 or older with them. For treatment visits, patients cannot have anyone with them due to current Covid guidelines and our immunocompromised population.

## 2022-02-20 ENCOUNTER — Other Ambulatory Visit: Payer: Self-pay

## 2022-02-22 ENCOUNTER — Telehealth: Payer: Self-pay | Admitting: Internal Medicine

## 2022-02-22 MED ORDER — ONETOUCH VERIO VI STRP: ORAL_STRIP | 3 refills | Status: DC

## 2022-02-22 NOTE — Telephone Encounter (Signed)
Pt is up-to-date sent rx to mail service.Marland KitchenJohny [name]

## 2022-02-22 NOTE — Telephone Encounter (Signed)
Estill Bamberg from [facility] is requesting an escribe or verbal confirmation to fill a 90 day supply of lancets glucose blood (ONETOUCH VERIO) test strip with 3 refills.  Callback: [phone] Fax: [phone] [place] address: [place] [address].

## 2022-03-01 ENCOUNTER — Telehealth: Payer: Self-pay | Admitting: Oncology

## 2022-03-01 NOTE — Telephone Encounter (Signed)
Per 8/2 phone line pt called to r/s appointmetn r/s per pt request

## 2022-03-02 ENCOUNTER — Other Ambulatory Visit: Payer: Self-pay

## 2022-03-07 ENCOUNTER — Telehealth: Payer: Self-pay | Admitting: Internal Medicine

## 2022-03-07 NOTE — Telephone Encounter (Signed)
Patient needs to get his One touch Verio lancets sent to [facility]

## 2022-03-08 MED ORDER — ONETOUCH VERIO VI STRP: ORAL_STRIP | 3 refills | Status: DC

## 2022-03-08 NOTE — Telephone Encounter (Signed)
[facility] has been [facility] to [facility]./lmb 

## 2022-03-10 ENCOUNTER — Other Ambulatory Visit: Payer: Self-pay

## 2022-03-10 ENCOUNTER — Ambulatory Visit: Payer: BC Managed Care – PPO

## 2022-03-10 DIAGNOSIS — E1165 Type 2 diabetes mellitus with hyperglycemia: Secondary | ICD-10-CM

## 2022-03-10 DIAGNOSIS — Z7901 Long term (current) use of anticoagulants: Secondary | ICD-10-CM

## 2022-03-10 LAB — POCT INR: INR: 2.3 (ref 2.0–3.0)

## 2022-03-10 MED ORDER — ONETOUCH ULTRASOFT LANCETS MISC: 12 refills | Status: AC

## 2022-03-10 NOTE — Patient Instructions (Addendum)
Pre visit review using our clinic review tool, if applicable. No additional management support is needed unless otherwise documented below in the visit note.  Continue 1 tablet daily except take 1 1/2 on Wednesdays. Recheck in 5 weeks.

## 2022-03-10 NOTE — Progress Notes (Signed)
Continue 1 tablet daily except take 1 1/2 on Wednesdays. Recheck in 5 weeks.

## 2022-03-15 ENCOUNTER — Telehealth: Payer: Self-pay | Admitting: Internal Medicine

## 2022-03-15 NOTE — Telephone Encounter (Signed)
Pt is requesting OneTOuch VERIO lancets be sent to his pharmacy. He said he called in and requested them twice and both times we sent in the OneTouch Verio test strips instead of the lancets. [facility] stated that onetouch verio lancets are free. He said he has to pay for the onetouch ultrasoft lancets.   Pharmacy: [facility] [place], [place] - [address] Phone:  [phone]  Fax:  [phone]        Please advise.

## 2022-03-16 NOTE — Telephone Encounter (Signed)
Unable to order those specific lancets, called pharmacy and spoke with Jerold Coombe and she says that the pharmacy will fix it and order them for the patient. Unable to notify patient as his phone rings and the line goes silent.

## 2022-03-17 ENCOUNTER — Other Ambulatory Visit: Payer: Self-pay | Admitting: Oncology

## 2022-03-17 DIAGNOSIS — C469 Kaposi's sarcoma, unspecified: Secondary | ICD-10-CM

## 2022-04-10 DIAGNOSIS — N1832 Chronic kidney disease, stage 3b: Secondary | ICD-10-CM | POA: Diagnosis not present

## 2022-04-14 ENCOUNTER — Ambulatory Visit: Payer: BC Managed Care – PPO | Admitting: Internal Medicine

## 2022-04-14 ENCOUNTER — Ambulatory Visit: Payer: BC Managed Care – PPO

## 2022-04-14 VITALS — BP 128/72 | HR 92 | Temp 98.7°F | Ht 68.0 in | Wt 201.0 lb

## 2022-04-14 DIAGNOSIS — Z131 Encounter for screening for diabetes mellitus: Secondary | ICD-10-CM

## 2022-04-14 DIAGNOSIS — I10 Essential (primary) hypertension: Secondary | ICD-10-CM

## 2022-04-14 DIAGNOSIS — Z7901 Long term (current) use of anticoagulants: Secondary | ICD-10-CM

## 2022-04-14 DIAGNOSIS — N1831 Chronic kidney disease, stage 3a: Secondary | ICD-10-CM | POA: Diagnosis not present

## 2022-04-14 DIAGNOSIS — E1165 Type 2 diabetes mellitus with hyperglycemia: Secondary | ICD-10-CM

## 2022-04-14 DIAGNOSIS — E7800 Pure hypercholesterolemia, unspecified: Secondary | ICD-10-CM | POA: Diagnosis not present

## 2022-04-14 LAB — POCT GLYCOSYLATED HEMOGLOBIN (HGB A1C): Hemoglobin A1C: 5.9 % — AB (ref 4.0–5.6)

## 2022-04-14 LAB — POCT INR: INR: 3.1 — AB (ref 2.0–3.0)

## 2022-04-14 MED ORDER — DAPAGLIFLOZIN PROPANEDIOL 5 MG PO TABS: 5.0000 mg | ORAL_TABLET | Freq: Every day | ORAL | 3 refills | Status: DC

## 2022-04-14 MED ORDER — AMOXICILLIN 500 MG PO CAPS: ORAL_CAPSULE | ORAL | 1 refills | Status: DC

## 2022-04-14 NOTE — Progress Notes (Unsigned)
Patient ID: Austin [name], male   DOB: Sep 16, 1955, 66 y.o.   MRN: [id number]        Chief Complaint: follow up HTN, HLD and hyperglycemia ***       HPI:  Austin [name] is a 66 y.o. male here with c/o        Wt Readings from Last 3 Encounters:  04/14/22 201 lb (91.2 kg)  02/08/22 203 lb (92.1 kg)  11/29/21 200 lb 1.6 oz (90.8 kg)   BP Readings from Last 3 Encounters:  04/14/22 128/72  02/08/22 131/77  10/21/21 119/75         Past Medical History:  Diagnosis Date   Anemia    iron def   Aortic valve prosthesis present    a. SJM - [facility] 2003, chronic coumadin.   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 s/p dissection and repair @ [facility] 2003 with SJM AVR with residual findings followed by CT.   Thyroid disease    Past Surgical History:  Procedure Laterality Date   ABDOMINAL AORTIC ANEURYSM REPAIR     AORTIC VALVE REPLACEMENT     impingment     right shoulder   IR IMAGING GUIDED PORT INSERTION  09/17/2020   KNEE ARTHROSCOPY     left   WISDOM TOOTH EXTRACTION      reports that he quit smoking about 20 years ago. His smoking use included cigarettes. He has a 15.00 pack-year smoking history. He has never used smokeless tobacco. He reports current alcohol use. He reports that he does not use drugs. family history includes Heart disease in his paternal aunt; Liver disease in his sister; Melanoma in his maternal uncle; Other in his maternal grandfather; Stroke in his mother; Stroke (age of onset: 37) in his father. Allergies  Allergen Reactions   Celecoxib Itching and Other (See Comments)    Joint pain   Current Outpatient Medications on File Prior to Visit  Medication Sig Dispense  Refill   fenofibrate 160 MG tablet TAKE 1 TABLET DAILY 90 tablet 1   ferrous sulfate 325 (65 FE) MG tablet Take 1 tablet (325 mg total) by mouth daily with breakfast. 30 tablet 3   furosemide (LASIX) 20 MG tablet Take 1 tablet daily x 3 days, then stop. And repeat if swelling recurs. 30 tablet 5   glucose blood (ONETOUCH VERIO) test strip Use to check blood sugars daily 100 each 3   Lancets (ONETOUCH ULTRASOFT) lancets Use as instructed 100 each 12   lidocaine-prilocaine (EMLA) cream Apply 1 application topically as needed. 30 g 0   losartan (COZAAR) 25 MG tablet TAKE 1 TABLET DAILY 90 tablet 1   metFORMIN (GLUCOPHAGE-XR) 500 MG 24 hr tablet Take 2 tablets (1,000 mg total) by mouth daily with breakfast. 180 tablet 3   metoprolol (TOPROL-XL) 200 MG 24 hr tablet TAKE 1 TABLET DAILY 90 tablet 1   Multiple Vitamin (MULTIVITAMIN) tablet Take 1 tablet by mouth daily.     rosuvastatin (CRESTOR) 40 MG tablet TAKE 1 TABLET DAILY 90 tablet 1   warfarin (COUMADIN)  5 MG tablet TAKE 1 TABLET BY MOUTH DAILY EXCEPT, TAKE ONE AND ONE-HALF TABLETS ON FRIDAYS OR TAKE AS DIRECTED BY ANTICOAGULATION CLINIC 100 tablet 3   No current facility-administered medications on file prior to visit.        ROS:  All others reviewed and negative.  Objective        PE:  BP 128/72 (BP Location: Right Arm, Patient Position: Sitting, Cuff Size: Large)   Pulse 92   Temp 98.7 F (37.1 C) (Oral)   Ht '5\' 8"'$  (1.727 m)   Wt 201 lb (91.2 kg)   SpO2 96%   BMI 30.56 kg/m                 Constitutional: Pt appears in NAD               HENT: Head: NCAT.                Right Ear: External ear normal.                 Left Ear: External ear normal.                Eyes: . Pupils are equal, round, and reactive to light. Conjunctivae and EOM are normal               Nose: without d/c or deformity               Neck: Neck supple. Gross normal ROM               Cardiovascular: Normal rate and regular rhythm.                  Pulmonary/Chest: Effort normal and breath sounds without rales or wheezing.                Abd:  Soft, NT, ND, + BS, no organomegaly               Neurological: Pt is alert. At baseline orientation, motor grossly intact               Skin: Skin is warm. No rashes, no other new lesions, LE edema - ***               Psychiatric: Pt behavior is normal without agitation   Micro: none  Cardiac tracings I have personally interpreted today:  none  Pertinent Radiological findings (summarize): none   Lab Results  Component Value Date   WBC 4.0 02/08/2022   HGB 13.9 02/08/2022   HCT 40.2 02/08/2022   PLT 165 02/08/2022   GLUCOSE 201 (H) 02/08/2022   CHOL 208 (H) 10/11/2021   TRIG (H) 10/11/2021    478.0 Triglyceride is over 400; calculations on Lipids are invalid.   HDL 35.50 (L) 10/11/2021   LDLDIRECT 107.0 10/11/2021   LDLCALC 88 09/13/2018   ALT 22 02/08/2022   AST 25 02/08/2022   NA 141 02/08/2022   K 4.2 02/08/2022   CL 109 02/08/2022   CREATININE 1.38 (H) 02/08/2022   BUN 23 02/08/2022   CO2 27 02/08/2022   TSH 4.37 10/11/2021   PSA 1.57 10/11/2021   INR 3.1 (A) 04/14/2022   HGBA1C 13.6 (H) 10/11/2021   MICROALBUR 7.9 (H) 10/11/2021   Hemoglobin A1C 4.0 - 5.6 % 5.9 Abnormal   13.6 High  R, CM  6.9   Assessment/Plan:  Austin [name] is a 66 y.o. 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  No problem-specific Assessment & Plan notes found for this encounter.  Followup: No follow-ups on file.  Cathlean Cower, MD 04/14/2022 11:35 AM [facility]

## 2022-04-14 NOTE — Progress Notes (Signed)
Pt reports taking coumadin this am. Take 1/2 tablet tomorrow then continue 1 tablet daily except take 1 1/2 on Wednesdays. Recheck in 4 weeks.

## 2022-04-14 NOTE — Patient Instructions (Addendum)
Pre visit review using our clinic review tool, if applicable. No additional management support is needed unless otherwise documented below in the visit note.   Take 1/2 tablet tomorrow then continue 1 tablet daily except take 1 1/2 on Wednesdays. Recheck in 4 weeks.

## 2022-04-14 NOTE — Patient Instructions (Signed)
Ok to stop the metformin  Please take all new medication as prescribed - the farxiga 5 mg per day  Your A1c was done today  Please continue all other medications as before, and refills have been done if requested - the amoxil  Please have the pharmacy call with any other refills you may need.  Please continue your efforts at being more active, low cholesterol diet, and weight control.  You are otherwise up to date with prevention measures today.  Please keep your appointments with your specialists as you may have planned - kidney doctor with lab next wk, and cardiology later this year  Please make an Appointment to return in 6 months, or sooner if needed

## 2022-04-17 ENCOUNTER — Encounter: Payer: Self-pay | Admitting: Internal Medicine

## 2022-04-17 DIAGNOSIS — C469 Kaposi's sarcoma, unspecified: Secondary | ICD-10-CM | POA: Diagnosis not present

## 2022-04-17 DIAGNOSIS — N1831 Chronic kidney disease, stage 3a: Secondary | ICD-10-CM | POA: Diagnosis not present

## 2022-04-17 DIAGNOSIS — I10 Essential (primary) hypertension: Secondary | ICD-10-CM | POA: Diagnosis not present

## 2022-04-17 NOTE — Assessment & Plan Note (Signed)
BP Readings from Last 3 Encounters:  04/14/22 128/72  02/08/22 131/77  10/21/21 119/75   Stable, pt to continue medical treatment losartan 25 mg qd, toprol xl 200 mg qd

## 2022-04-17 NOTE — Assessment & Plan Note (Signed)
Lab Results  Component Value Date   LDLCALC 88 09/13/2018   Stable, pt to continue current statin crestor 40 mg qd

## 2022-04-17 NOTE — Assessment & Plan Note (Signed)
Lab Results  Component Value Date   HGBA1C 5.9 (A) 04/14/2022   Much improved, finished DM education, pt to change metformin to farxiga 5 mg in light of CKD,  to f/u any worsening symptoms or concerns

## 2022-04-17 NOTE — Assessment & Plan Note (Signed)
Lab Results  Component Value Date   CREATININE 1.38 (H) 02/08/2022    Stable, pt to start current medical treatment farxiga 5 mg daily

## 2022-04-27 ENCOUNTER — Telehealth: Payer: Self-pay | Admitting: Oncology

## 2022-04-27 NOTE — Telephone Encounter (Signed)
Called patient regarding upcoming October appointments, patient is notified.  

## 2022-05-03 ENCOUNTER — Other Ambulatory Visit: Payer: Self-pay | Admitting: Internal Medicine

## 2022-05-03 NOTE — Telephone Encounter (Signed)
Please refill as per office routine med refill policy (all routine meds to be refilled for 3 mo or monthly (per pt preference) up to one year from last visit, then month to month grace period for 3 mo, then further med refills will have to be denied) ? ?

## 2022-05-08 MED FILL — Dexamethasone Sodium Phosphate Inj 100 MG/10ML: INTRAMUSCULAR | Qty: 1 | Status: AC

## 2022-05-09 ENCOUNTER — Inpatient Hospital Stay: Payer: BC Managed Care – PPO | Admitting: Oncology

## 2022-05-09 ENCOUNTER — Other Ambulatory Visit: Payer: Self-pay

## 2022-05-09 ENCOUNTER — Inpatient Hospital Stay: Payer: BC Managed Care – PPO

## 2022-05-09 ENCOUNTER — Inpatient Hospital Stay: Payer: BC Managed Care – PPO | Attending: Oncology

## 2022-05-09 VITALS — BP 114/69 | HR 58 | Temp 97.6°F | Resp 18 | Ht 68.0 in | Wt 199.9 lb

## 2022-05-09 DIAGNOSIS — Z5111 Encounter for antineoplastic chemotherapy: Secondary | ICD-10-CM | POA: Insufficient documentation

## 2022-05-09 DIAGNOSIS — C469 Kaposi's sarcoma, unspecified: Secondary | ICD-10-CM | POA: Diagnosis not present

## 2022-05-09 DIAGNOSIS — I429 Cardiomyopathy, unspecified: Secondary | ICD-10-CM | POA: Insufficient documentation

## 2022-05-09 LAB — CMP (CANCER CENTER ONLY)
ALT: 18 U/L (ref 0–44)
AST: 24 U/L (ref 15–41)
Albumin: 4.2 g/dL (ref 3.5–5.0)
Alkaline Phosphatase: 23 U/L — ABNORMAL LOW (ref 38–126)
Anion gap: 4 — ABNORMAL LOW (ref 5–15)
BUN: 25 mg/dL — ABNORMAL HIGH (ref 8–23)
CO2: 27 mmol/L (ref 22–32)
Calcium: 9 mg/dL (ref 8.9–10.3)
Chloride: 110 mmol/L (ref 98–111)
Creatinine: 1.47 mg/dL — ABNORMAL HIGH (ref 0.61–1.24)
GFR, Estimated: 52 mL/min — ABNORMAL LOW (ref >60)
Glucose, Bld: 225 mg/dL — ABNORMAL HIGH (ref 70–99)
Potassium: 4.3 mmol/L (ref 3.5–5.1)
Sodium: 141 mmol/L (ref 135–145)
Total Bilirubin: 0.6 mg/dL (ref 0.3–1.2)
Total Protein: 6.6 g/dL (ref 6.5–8.1)

## 2022-05-09 LAB — CBC WITH DIFFERENTIAL (CANCER CENTER ONLY)
Abs Immature Granulocytes: 0.01 10*3/uL (ref 0.00–0.07)
Basophils Absolute: 0 10*3/uL (ref 0.0–0.1)
Basophils Relative: 1 %
Eosinophils Absolute: 0.2 10*3/uL (ref 0.0–0.5)
Eosinophils Relative: 4 %
HCT: 40.6 % (ref 39.0–52.0)
Hemoglobin: 14.3 g/dL (ref 13.0–17.0)
Immature Granulocytes: 0 %
Lymphocytes Relative: 26 %
Lymphs Abs: 0.9 10*3/uL (ref 0.7–4.0)
MCH: 29.5 pg (ref 26.0–34.0)
MCHC: 35.2 g/dL (ref 30.0–36.0)
MCV: 83.7 fL (ref 80.0–100.0)
Monocytes Absolute: 0.3 10*3/uL (ref 0.1–1.0)
Monocytes Relative: 9 %
Neutro Abs: 2.1 10*3/uL (ref 1.7–7.7)
Neutrophils Relative %: 60 %
Platelet Count: 168 10*3/uL (ref 150–400)
RBC: 4.85 MIL/uL (ref 4.22–5.81)
RDW: 13.1 % (ref 11.5–15.5)
WBC Count: 3.5 10*3/uL — ABNORMAL LOW (ref 4.0–10.5)
nRBC: 0 % (ref 0.0–0.2)

## 2022-05-09 MED ORDER — DOXORUBICIN HCL LIPOSOMAL CHEMO INJECTION 2 MG/ML
29.0000 mg/m2 | Freq: Once | INTRAVENOUS | Status: AC
  Administered 2022-05-09: 60 mg via INTRAVENOUS
  Filled 2022-05-09: qty 30

## 2022-05-09 MED ORDER — SODIUM CHLORIDE 0.9 % IV SOLN
10.0000 mg | Freq: Once | INTRAVENOUS | Status: AC
  Administered 2022-05-09: 10 mg via INTRAVENOUS
  Filled 2022-05-09: qty 10

## 2022-05-09 MED ORDER — HEPARIN SOD (PORK) LOCK FLUSH 100 UNIT/ML IV SOLN
500.0000 [IU] | Freq: Once | INTRAVENOUS | Status: AC | PRN
  Administered 2022-05-09: 500 [IU]

## 2022-05-09 MED ORDER — SODIUM CHLORIDE 0.9% FLUSH
10.0000 mL | INTRAVENOUS | Status: DC | PRN
  Administered 2022-05-09: 10 mL

## 2022-05-09 MED ORDER — DEXTROSE 5 % IV SOLN: Freq: Once | INTRAVENOUS | Status: AC

## 2022-05-09 NOTE — Patient Instructions (Signed)
[facility]  Discharge Instructions: Thank you for choosing [facility] to provide your oncology and hematology care.   If you have a lab appointment with the [facility], please go directly to the [facility] and check in at the registration area.   Wear comfortable clothing and clothing appropriate for easy access to any Portacath or PICC line.   We strive to give you quality time with your provider. You may need to reschedule your appointment if you arrive late (15 or more minutes).  Arriving late affects you and other patients whose appointments are after yours.  Also, if you miss three or more appointments without notifying the office, you may be dismissed from the clinic at the provider's discretion.      For prescription refill requests, have your pharmacy contact our office and allow 72 hours for refills to be completed.    Today you received the following chemotherapy and/or immunotherapy agent: Doxil      To help prevent nausea and vomiting after your treatment, we encourage you to take your nausea medication as directed.  BELOW ARE SYMPTOMS THAT SHOULD BE REPORTED IMMEDIATELY: *FEVER GREATER THAN 100.4 F (38 C) OR HIGHER *CHILLS OR SWEATING *NAUSEA AND VOMITING THAT IS NOT CONTROLLED WITH YOUR NAUSEA MEDICATION *UNUSUAL SHORTNESS OF BREATH *UNUSUAL BRUISING OR BLEEDING *URINARY PROBLEMS (pain or burning when urinating, or frequent urination) *BOWEL PROBLEMS (unusual diarrhea, constipation, pain near the anus) TENDERNESS IN MOUTH AND THROAT WITH OR WITHOUT PRESENCE OF ULCERS (sore throat, sores in mouth, or a toothache) UNUSUAL RASH, SWELLING OR PAIN  UNUSUAL VAGINAL DISCHARGE OR ITCHING   Items with * indicate a potential emergency and should be followed up as soon as possible or go to the Emergency Department if any problems should occur.  Please show the CHEMOTHERAPY ALERT CARD or IMMUNOTHERAPY ALERT CARD at check-in to the  Emergency Department and triage nurse.  Should you have questions after your visit or need to cancel or reschedule your appointment, please contact [facility]  Dept: [phone]  and follow the prompts.  Office hours are 8:00 a.m. to 4:30 p.m. Monday - Friday. Please note that voicemails left after 4:00 p.m. may not be returned until the following business day.  We are closed weekends and major holidays. You have access to a nurse at all times for urgent questions. Please call the main number to the clinic Dept: [phone] and follow the prompts.   For any non-urgent questions, you may also contact your provider using MyChart. We now offer e-Visits for anyone 22 and older to request care online for non-urgent symptoms. For details visit [email or web address].   Also download the MyChart app! Go to the app store, search "MyChart", open the app, select [facility], and log in with your MyChart username and password.  Due to Covid, a mask is required upon entering the hospital/clinic. If you do not have a mask, one will be given to you upon arrival. For doctor visits, patients may have 1 support Milyn Stapleton aged 67 or older with them. For treatment visits, patients cannot have anyone with them due to current Covid guidelines and our immunocompromised population.

## 2022-05-09 NOTE — Progress Notes (Signed)
Hematology and Oncology Follow Up Visit  ODES LOLLI [id number] [birth year] 66 y.o. 05/09/2022 9:04 AM [name], MDJohn, [name], MD   Principle Diagnosis: 66 year old man with Kaposi's sarcoma diagnosed in 2016.  He presented with extensive lower extremity cutaneous manifestations and relapsed in 2022.  His work-up was negative for any viral illness including HIV.      Past therapy:  Doxil chemotherapy given at 30 mg/m every 4 weeks the first cycle given at [facility] in June 2016.   He completed 6 cycles of therapy in November 2016.   He developed relapsed disease in September 2019. Doxil 30 mg/m every 4 weeks restarted in September 2019.  He completed 6 months of therapy.  He developed relapsed disease February 2022 with increased skin lesions on his lower extremities and drainage.   Doxil 60 mg total dose every 4 weeks restarted on September 13, 2020.  Last infusion given in August 2022.   Current therapy: Doxil 60 mg total dose every 3 months started in December 2022.  He is here for the next cycle of therapy.  Interim History:  [name] presents today for repeat follow-up visit.  Since last visit, he tolerated last Doxil treatment 3 months ago without any issues.  He denies any nausea, vomiting or abdominal pain.  He denies any hospitalizations or illnesses.  He denies any lower extremity swelling or shortness of breath.  He denies any cutaneous manifestations of Kaposi's.       Medications: Reviewed without changes. Current Outpatient Medications  Medication Sig Dispense Refill   amoxicillin (AMOXIL) 500 MG capsule Take 4 capsules by mouth 4 hours prior to appointment 12 capsule 1   dapagliflozin propanediol (FARXIGA) 5 MG TABS tablet Take 1 tablet (5 mg total) by mouth daily. 90 tablet 3   fenofibrate 160 MG tablet TAKE 1 TABLET DAILY 90 tablet 3   ferrous sulfate 325 (65 FE) MG tablet Take 1 tablet (325 mg total) by mouth daily with  breakfast. 30 tablet 3   furosemide (LASIX) 20 MG tablet Take 1 tablet daily x 3 days, then stop. And repeat if swelling recurs. 30 tablet 5   glucose blood (ONETOUCH VERIO) test strip Use to check blood sugars daily 100 each 3   Lancets (ONETOUCH ULTRASOFT) lancets Use as instructed 100 each 12   lidocaine-prilocaine (EMLA) cream Apply 1 application topically as needed. 30 g 0   losartan (COZAAR) 25 MG tablet TAKE 1 TABLET DAILY 90 tablet 3   metoprolol (TOPROL-XL) 200 MG 24 hr tablet TAKE 1 TABLET DAILY 90 tablet 3   Multiple Vitamin (MULTIVITAMIN) tablet Take 1 tablet by mouth daily.     rosuvastatin (CRESTOR) 40 MG tablet TAKE 1 TABLET DAILY 90 tablet 3   warfarin (COUMADIN) 5 MG tablet TAKE 1 TABLET BY MOUTH DAILY EXCEPT, TAKE ONE AND ONE-HALF TABLETS ON FRIDAYS OR TAKE AS DIRECTED BY ANTICOAGULATION CLINIC 100 tablet 3   No current facility-administered medications for this visit.     Allergies:  Allergies  Allergen Reactions   Celecoxib Itching and Other (See Comments)    Joint pain       Physical Exam:             Blood pressure 114/69, pulse (!) 58, temperature 97.6 F (36.4 C), temperature source Tympanic, resp. rate 18, height '5\' 8"'$  (1.727 m), weight 199 lb 14.4 oz (90.7 kg), SpO2 99 %.       ECOG: 0  General appearance: Comfortable appearing without any discomfort Head: Normocephalic without any trauma Oropharynx: Mucous membranes are moist and pink without any thrush or ulcers. Eyes: Pupils are equal and round reactive to light. Lymph nodes: No cervical, supraclavicular, inguinal or axillary lymphadenopathy.   Heart:regular rate and rhythm.  S1 and S2 without leg edema. Lung: Clear without any rhonchi or wheezes.  No dullness to percussion. Abdomin: Soft, nontender, nondistended with good bowel sounds.  No hepatosplenomegaly. Musculoskeletal: No joint deformity or effusion.  Full range of motion noted. Neurological: No deficits noted on  motor, sensory and deep tendon reflex exam. Skin: Scaly healed lesions noted on bilaterally in the lower extremities.  This more noticeable on the right than the left.           Lab Results: Lab Results  Component Value Date   WBC 4.0 02/08/2022   HGB 13.9 02/08/2022   HCT 40.2 02/08/2022   MCV 83.9 02/08/2022   PLT 165 02/08/2022   PSA 1.57 10/11/2021     Chemistry      Component Value Date/Time   NA 141 02/08/2022 0831   NA 141 07/08/2021 1029   NA 143 11/03/2016 0908   K 4.2 02/08/2022 0831   K 5.0 11/03/2016 0908   CL 109 02/08/2022 0831   CO2 27 02/08/2022 0831   CO2 27 11/03/2016 0908   BUN 23 02/08/2022 0831   BUN 31 (H) 07/08/2021 1029   BUN 33.5 (H) 11/03/2016 0908   CREATININE 1.38 (H) 02/08/2022 0831   CREATININE 1.5 (H) 11/03/2016 0908      Component Value Date/Time   CALCIUM 9.4 02/08/2022 0831   CALCIUM 9.5 11/03/2016 0908   ALKPHOS 22 (L) 02/08/2022 0831   ALKPHOS 25 (L) 11/03/2016 0908   AST 25 02/08/2022 0831   AST 29 11/03/2016 0908   ALT 22 02/08/2022 0831   ALT 29 11/03/2016 0908   BILITOT 0.6 02/08/2022 0831   BILITOT 0.70 11/03/2016 0908      Impression and Plan:   66 year old with  1.  Recurrent Kaposi's sarcoma with initially diagnosed in 2016.  Etiology remains unclear with the HIV status is negative.   He has developed relapsed disease multiple times off Doxil therapy.  He is currently on maintenance regimen receiving it every 3 months and has done so for the last year.  I recommended extending the Doxil regimen to every 4 months and this could be changed to every 6 months if she continues to have no evidence of relapsed disease.  He continues to enjoy excellent tolerance as well as a complete response to therapy.   2. Cardiomyopathy: His last echocardiogram in February 2022 showed no worsening cardiomyopathy.  He has follow-up with cardiology in the future.   3.  IV access: Port-A-Cath continues to be in use without any  issues.   4. Follow-up: In 4 months for repeat follow-up and repeat Doxil treatment.  This could be changed every 6 months after that.   30  minutes were spent on this visit.  The time was dedicated to reviewing disease status, treatment choices and outlining future plan of care review.    Zola Button, MD 10/10/20239:04 AM

## 2022-05-11 ENCOUNTER — Other Ambulatory Visit: Payer: BC Managed Care – PPO

## 2022-05-11 ENCOUNTER — Ambulatory Visit: Payer: BC Managed Care – PPO

## 2022-05-11 ENCOUNTER — Ambulatory Visit: Payer: BC Managed Care – PPO | Admitting: Oncology

## 2022-05-11 DIAGNOSIS — H90A22 Sensorineural hearing loss, unilateral, left ear, with restricted hearing on the contralateral side: Secondary | ICD-10-CM | POA: Diagnosis not present

## 2022-05-12 ENCOUNTER — Ambulatory Visit (INDEPENDENT_AMBULATORY_CARE_PROVIDER_SITE_OTHER): Payer: BC Managed Care – PPO

## 2022-05-12 DIAGNOSIS — Z7901 Long term (current) use of anticoagulants: Secondary | ICD-10-CM

## 2022-05-12 LAB — POCT INR: INR: 2.6 (ref 2.0–3.0)

## 2022-05-12 NOTE — Patient Instructions (Signed)
Continue to take 1 tablet daily except take 1 1/2 on Wednesdays. Recheck in 6 weeks. 

## 2022-05-12 NOTE — Progress Notes (Signed)
Continue to take 1 tablet daily except take 1 1/2 on Wednesdays. Recheck in 6 weeks. 

## 2022-06-20 ENCOUNTER — Ambulatory Visit (INDEPENDENT_AMBULATORY_CARE_PROVIDER_SITE_OTHER): Payer: BC Managed Care – PPO

## 2022-06-20 DIAGNOSIS — Z7901 Long term (current) use of anticoagulants: Secondary | ICD-10-CM

## 2022-06-20 LAB — POCT INR: INR: 2.2 (ref 2.0–3.0)

## 2022-06-20 NOTE — Patient Instructions (Signed)
Continue to take 1 tablet daily except take 1 1/2 on Wednesdays. Recheck on Jan 2 at 10:00.

## 2022-06-20 NOTE — Progress Notes (Signed)
Continue to take 1 tablet daily except take 1 1/2 on Wednesdays. Recheck in 6 weeks. 

## 2022-06-21 ENCOUNTER — Other Ambulatory Visit: Payer: Self-pay

## 2022-07-05 ENCOUNTER — Ambulatory Visit: Payer: BC Managed Care – PPO | Attending: Cardiovascular Disease | Admitting: Cardiovascular Disease

## 2022-07-05 ENCOUNTER — Encounter: Payer: Self-pay | Admitting: Cardiovascular Disease

## 2022-07-05 VITALS — BP 122/82 | HR 54 | Ht 68.0 in | Wt 205.2 lb

## 2022-07-05 DIAGNOSIS — I712 Thoracic aortic aneurysm, without rupture, unspecified: Secondary | ICD-10-CM | POA: Diagnosis not present

## 2022-07-05 DIAGNOSIS — I10 Essential (primary) hypertension: Secondary | ICD-10-CM

## 2022-07-05 DIAGNOSIS — I359 Nonrheumatic aortic valve disorder, unspecified: Secondary | ICD-10-CM

## 2022-07-05 NOTE — Progress Notes (Signed)
Chief Complaint  Patient presents with   Follow-up    Aortic dissection   History of Present Illness: 66 yo male with history of HTN, HLD, ascending aortic aneursym/dissection with repair at [facility] in 2003 with St. 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 His surgery was at [facility] in 2003. He has had a residual thoracic and abdominal aortic dissection since then. Imaging showed large dissection extending from ascending aorta down into the abdominal aorta and iliacs. The false lumen in the descending thoracic aortic aneurysm is larger than the true lumen. This has been stable for years and CT surgery does not think surgical correction is indicated. There is some involvement of the arch vessels and renal arteries. He was also found to have a right lung nodule and lesions in the liver and kidneys that were hard to characterize. Echo May 2016 at [facility] with normal LV function, normally functioning AVR. He is on chronic coumadin for anticoagulation after mechanical AVR.  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 He has been seen in [facility] and is followed in [place]. He is HIV negative.   CTA chest December 2022 with stable chronic aortic dissection. Echo February 2022 with LVEF=50-55%, normally functioning mechanical AVR.   He is here today for follow up. The patient denies any chest pain, dyspnea, palpitations, lower extremity edema, orthopnea, PND, dizziness, near syncope or syncope.   Primary Care Physician: [name], MD  Past Medical History:  Diagnosis Date   Anemia    iron def   Aortic valve prosthesis present    a. SJM - [facility] 2003, chronic coumadin.   Back pain    CKD (chronic kidney disease), stage II    Coronary artery disease    not sure where this dx came from - pt denies   Family history of melanoma    Hearing loss    Hyperlipidemia    Hypertension    Internal hemorrhoid     Kaposi sarcoma ([place])    Obesity    Pericardial effusion    Thoracic aortic aneurysm ([employer])    a. s/p dissection and repair @ [facility] 2003 with SJM AVR with residual findings followed by CT.   Thyroid disease     Past Surgical History:  Procedure Laterality Date   ABDOMINAL AORTIC ANEURYSM REPAIR     AORTIC VALVE REPLACEMENT     impingment     right shoulder   IR IMAGING GUIDED PORT INSERTION  09/17/2020   KNEE ARTHROSCOPY     left   WISDOM TOOTH EXTRACTION      Current Outpatient Medications  Medication Sig Dispense Refill   amoxicillin (AMOXIL) 500 MG capsule Take 4 capsules by mouth 4 hours prior to appointment 12 capsule 1   dapagliflozin propanediol (FARXIGA) 5 MG TABS tablet Take 1 tablet (5 mg total) by mouth daily. 90 tablet 3   fenofibrate 160 MG tablet TAKE 1 TABLET DAILY 90 tablet 3   ferrous sulfate 325 (65 FE) MG tablet Take 1 tablet (325 mg total) by mouth daily with breakfast. 30 tablet 3   glucose blood (ONETOUCH VERIO) test strip Use to check blood sugars daily 100 each 3   Lancets (ONETOUCH ULTRASOFT) lancets Use as instructed 100 each 12   lidocaine-prilocaine (EMLA) cream Apply 1 application topically as needed. 30 g 0   losartan (COZAAR) 25 MG tablet TAKE 1 TABLET DAILY 90 tablet 3  metoprolol (TOPROL-XL) 200 MG 24 hr tablet TAKE 1 TABLET DAILY 90 tablet 3   Multiple Vitamin (MULTIVITAMIN) tablet Take 1 tablet by mouth daily.     rosuvastatin (CRESTOR) 40 MG tablet TAKE 1 TABLET DAILY 90 tablet 3   warfarin (COUMADIN) 5 MG tablet TAKE 1 TABLET BY MOUTH DAILY EXCEPT, TAKE ONE AND ONE-HALF TABLETS ON FRIDAYS OR TAKE AS DIRECTED BY ANTICOAGULATION CLINIC 100 tablet 3   No current facility-administered medications for this visit.    Allergies  Allergen Reactions   Celecoxib Itching and Other (See Comments)    Joint pain    Social History   Socioeconomic History   Marital status: Single    Spouse name: Not on file   Number of children: 0   Years of  education: MDIV   Highest education level: Not on file  Occupational History   Occupation: Civil Service fast streamer: [employer]  Tobacco Use   Smoking status: Former    Packs/day: 1.00    Years: 15.00    Total pack years: 15.00    Types: Cigarettes    Quit date: 10/10/2001    Years since quitting: 20.7   Smokeless tobacco: Never  Substance and Sexual Activity   Alcohol use: Yes    Comment: one drink per day   Drug use: No   Sexual activity: Not Currently  Other Topics Concern   Not on file  Social History Narrative   Pt lives at home alone.   Caffeine Use: Less than 1 cup daily.   Social Determinants of Health   Financial Resource Strain: Not on file  Food Insecurity: Not on file  Transportation Needs: Not on file  Physical Activity: Not on file  Stress: Not on file  Social Connections: Not on file  Intimate Partner Violence: Not on file    Family History  Problem Relation Age of Onset   Stroke Mother        late 73s   Stroke Father 94   Liver disease Sister        Sclerosing cholegitis necessitating a liver transplant   Melanoma Maternal Uncle        dx in his 32s   Heart disease Paternal Aunt    Other Maternal Grandfather        died under the age of 36 due to a health related issue   Colon cancer Neg Hx     Review of Systems:  As stated in the HPI and otherwise negative.   BP 122/82   Pulse (!) 54   Ht '5\' 8"'$  (1.727 m)   Wt 205 lb 3.2 oz (93.1 kg)   SpO2 99%   BMI 31.20 kg/m   Physical Examination: General: Well developed, well nourished, NAD  HEENT: OP clear, mucus membranes moist  SKIN: warm, dry. No rashes. Neuro: No focal deficits  Musculoskeletal: Muscle strength 5/5 all ext  Psychiatric: Mood and affect normal  Neck: No JVD, no carotid bruits, no thyromegaly, no lymphadenopathy.  Lungs:Clear bilaterally, no wheezes, rhonci, crackles Cardiovascular: Regular rate and rhythm. No murmurs, gallops or rubs. Abdomen:Soft. Bowel sounds  present. Non-tender.  Extremities: No lower extremity edema. Pulses are 2 + in the bilateral DP/PT.  EKG:  EKG is ordered today. The ekg ordered today demonstrates Sinus brady, rate 54 bpm, T wave abn lateral leads  Echo 09/21/20:  1. Left ventricular ejection fraction, by estimation, is 50 to 55%. The  left ventricle has low normal function. The left  ventricle demonstrates  regional wall motion abnormalities (see scoring diagram/findings for  description). There is mild left  ventricular hypertrophy. Left ventricular diastolic parameters are  consistent with Grade I diastolic dysfunction (impaired relaxation). There  is moderate dyskinesis of the left ventricular, entire septal wall.   2. Right ventricular systolic function is normal. The right ventricular  size is normal. There is normal pulmonary artery systolic pressure. The  estimated right ventricular systolic pressure is 38.8 mmHg.   3. The mitral valve is grossly normal. Trivial mitral valve  regurgitation.   4. The aortic valve has been repaired/replaced. Aortic valve  regurgitation is trivial. There is a St. Jude valve present in the aortic  position. Procedure Date: 2003. Echo findings are consistent with normal  structure and function of the aortic valve  prosthesis. Aortic valve mean gradient measures 18.0 mmHg.   5. Aortic dilatation noted and root/ascending aorta has been  repaired/replaced. There is mild dilatation of the aortic root, measuring  42 mm. There is mild dilatation of the ascending aorta, measuring 40 mm.   Recent Labs: 10/11/2021: TSH 4.37 05/09/2022: ALT 18; BUN 25; Creatinine 1.47; Hemoglobin 14.3; Platelet Count 168; Potassium 4.3; Sodium 141   Lipid Panel    Component Value Date/Time   CHOL 208 (H) 10/11/2021 1656   TRIG (H) 10/11/2021 1656    478.0 Triglyceride is over 400; calculations on Lipids are invalid.   HDL 35.50 (L) 10/11/2021 1656   CHOLHDL 6 10/11/2021 1656   VLDL 47.8 (H) 11/08/2020  0922   LDLCALC 88 09/13/2018 0901   LDLDIRECT 107.0 10/11/2021 1656     Wt Readings from Last 3 Encounters:  07/05/22 205 lb 3.2 oz (93.1 kg)  05/09/22 199 lb 14.4 oz (90.7 kg)  04/14/22 201 lb (91.2 kg)    Assessment and Plan:   1. THORACIC AORTIC ANEURYSM/DISSECTION: He is known to have a type A aortic dissection with extension from his thoracic aorta throughout both iliac arteries affecting the right renal artery but his has been stable. Stable by chest CTA December 2022.  Will repeat chest CTA in December 2023.   2. S/P aortic valve replacement: Mechanical valve functioning well by echo February 2022  3. Mitral regurgitation: Trivial by echo February 2022.   4. HTN: BP is controlled. No changes today  Labs/ tests ordered today include:   Orders Placed This Encounter  Procedures   EKG 12-Lead    Disposition:   F/U with me in 12  months  Signed, Lauree Chandler, MD 07/05/2022 10:20 AM    [facility] [address], [place], [place]  [address] Phone: [phone]; Fax: [phone]

## 2022-07-05 NOTE — Patient Instructions (Signed)
Medication Instructions:  No changes *If you need a refill on your cardiac medications before your next appointment, please call your pharmacy*   Lab Work: none If you have labs (blood work) drawn today and your tests are completely normal, you will receive your results only by: MyChart Message (if you have MyChart) OR A paper copy in the mail If you have any lab test that is abnormal or we need to change your treatment, we will call you to review the results.   Testing/Procedures: none   Follow-Up: At [facility], you and your health needs are our priority.  As part of our continuing mission to provide you with exceptional heart care, we have created designated Provider Care Teams.  These Care Teams include your primary Cardiologist (physician) and Advanced Practice Providers (APPs -  Physician Assistants and Nurse Practitioners) who all work together to provide you with the care you need, when you need it.   Your next appointment:   12 month(s)  The format for your next appointment:   In Person  Provider:   Christopher McAlhany, MD     Important Information About Sugar       

## 2022-07-26 ENCOUNTER — Telehealth: Payer: Self-pay

## 2022-07-26 NOTE — Telephone Encounter (Signed)
Pt stated he tested POS for COVID 07/26/2022. Pt is wanting to know if he needs anti-viral even if his sxs are very mild and now bothersome att his time.  Pts sxs started 07/25/2022 which are throat and sinus congestion. Pt has sched a VV with [facility] for 07/27/2022.

## 2022-07-27 ENCOUNTER — Encounter: Payer: Self-pay | Admitting: Family Medicine

## 2022-07-27 ENCOUNTER — Telehealth (INDEPENDENT_AMBULATORY_CARE_PROVIDER_SITE_OTHER): Payer: BC Managed Care – PPO | Admitting: Family Medicine

## 2022-07-27 DIAGNOSIS — U071 COVID-19: Secondary | ICD-10-CM

## 2022-07-27 DIAGNOSIS — J029 Acute pharyngitis, unspecified: Secondary | ICD-10-CM

## 2022-07-27 DIAGNOSIS — R051 Acute cough: Secondary | ICD-10-CM | POA: Diagnosis not present

## 2022-07-27 MED ORDER — MOLNUPIRAVIR 200 MG PO CAPS: 4.0000 | ORAL_CAPSULE | Freq: Two times a day (BID) | ORAL | 0 refills | Status: AC

## 2022-07-27 NOTE — Patient Instructions (Signed)
I prescribed molnupiravir, antiviral medication for your current COVID infection.  Stay well-hydrated, take Tylenol as needed.  I also recommend salt water gargles for sore throat.  Mucinex for cough and congestion.  You may come out of quarantine on July 31, 2022.  Please wear a mask for an additional 5 days after that.  Follow-up if you have any questions or concerns.

## 2022-07-27 NOTE — Progress Notes (Signed)
MyChart Video Visit    Virtual Visit via Video Note   This visit type was conducted due to national recommendations for restrictions regarding the COVID-19 Pandemic (e.g. social distancing) in an effort to limit this patient's exposure and mitigate transmission in our community. This patient is at least at moderate risk for complications without adequate follow up. This format is felt to be most appropriate for this patient at this time. Physical exam was limited by quality of the video and audio technology used for the visit. CMA was able to get the patient set up on a video visit.  Patient location: Home. Patient and provider in visit Provider location: Office  I discussed the limitations of evaluation and management by telemedicine and the availability of in person appointments. The patient expressed understanding and agreed to proceed.  Visit Date: 07/27/2022  Today's healthcare provider: Harland Dingwall, NP-C     Subjective:    Patient ID: Austin [name], male    DOB: [birth year], 66 y.o.   MRN: [id number]  Chief Complaint  Patient presents with   Covid Positive    Tested postive Wednesday Sx started tuesday    HPI Symptom onset 2 days ago with ST, nasal congestion, fatigue. Mild cough.  Tested positive for Covid yesterday.   No fever, chills, body aches, chest pain, palpitations, shortness of breath, abdominal pain, N/V/D.   Denies having Covid in the past.  Has had all of his vaccines.     Past Medical History:  Diagnosis Date   Anemia    iron def   Aortic valve prosthesis present    a. SJM - [facility] 2003, chronic coumadin.   Back pain    CKD (chronic kidney disease), stage II    Coronary artery disease    not sure where this dx came from - pt denies   Family history of melanoma    Hearing loss    Hyperlipidemia    Hypertension    Internal hemorrhoid    Kaposi sarcoma ([place])    Obesity    Pericardial effusion    Thoracic aortic aneurysm ([place])    a.  s/p dissection and repair @ [facility] 2003 with SJM AVR with residual findings followed by CT.   Thyroid disease     Past Surgical History:  Procedure Laterality Date   ABDOMINAL AORTIC ANEURYSM REPAIR     AORTIC VALVE REPLACEMENT     impingment     right shoulder   IR IMAGING GUIDED PORT INSERTION  09/17/2020   KNEE ARTHROSCOPY     left   WISDOM TOOTH EXTRACTION      Family History  Problem Relation Age of Onset   Stroke Mother        late 70s   Stroke Father 65   Liver disease Sister        Sclerosing cholegitis necessitating a liver transplant   Melanoma Maternal Uncle        dx in his 30s   Heart disease Paternal Aunt    Other Maternal Grandfather        died under the age of 69 due to a health related issue   Colon cancer Neg Hx     Social History   Socioeconomic History   Marital status: Single    Spouse name: Not on file   Number of children: 0   Years of education: MDIV   Highest education level: Not on file  Occupational History   Occupation: Editor, commissioning  Employer: [employer]  Tobacco Use   Smoking status: Former    Packs/day: 1.00    Years: 15.00    Total pack years: 15.00    Types: Cigarettes    Quit date: 10/10/2001    Years since quitting: 20.8   Smokeless tobacco: Never  Substance and Sexual Activity   Alcohol use: Yes    Comment: one drink per day   Drug use: No   Sexual activity: Not Currently  Other Topics Concern   Not on file  Social History Narrative   Pt lives at home alone.   Caffeine Use: Less than 1 cup daily.   Social Determinants of Health   Financial Resource Strain: Not on file  Food Insecurity: Not on file  Transportation Needs: Not on file  Physical Activity: Not on file  Stress: Not on file  Social Connections: Not on file  Intimate Partner Violence: Not on file    Outpatient Medications Prior to Visit  Medication Sig Dispense Refill   dapagliflozin propanediol (FARXIGA) 5 MG TABS tablet Take 1 tablet (5  mg total) by mouth daily. 90 tablet 3   fenofibrate 160 MG tablet TAKE 1 TABLET DAILY 90 tablet 3   ferrous sulfate 325 (65 FE) MG tablet Take 1 tablet (325 mg total) by mouth daily with breakfast. 30 tablet 3   glucose blood (ONETOUCH VERIO) test strip Use to check blood sugars daily 100 each 3   Lancets (ONETOUCH ULTRASOFT) lancets Use as instructed 100 each 12   lidocaine-prilocaine (EMLA) cream Apply 1 application topically as needed. 30 g 0   losartan (COZAAR) 25 MG tablet TAKE 1 TABLET DAILY 90 tablet 3   metoprolol (TOPROL-XL) 200 MG 24 hr tablet TAKE 1 TABLET DAILY 90 tablet 3   Multiple Vitamin (MULTIVITAMIN) tablet Take 1 tablet by mouth daily.     rosuvastatin (CRESTOR) 40 MG tablet TAKE 1 TABLET DAILY 90 tablet 3   warfarin (COUMADIN) 5 MG tablet TAKE 1 TABLET BY MOUTH DAILY EXCEPT, TAKE ONE AND ONE-HALF TABLETS ON FRIDAYS OR TAKE AS DIRECTED BY ANTICOAGULATION CLINIC 100 tablet 3   amoxicillin (AMOXIL) 500 MG capsule Take 4 capsules by mouth 4 hours prior to appointment (Patient not taking: Reported on 07/27/2022) 12 capsule 1   No facility-administered medications prior to visit.    Allergies  Allergen Reactions   Celecoxib Itching and Other (See Comments)    Joint pain    ROS     Objective:    Physical Exam  There were no vitals taken for this visit. Wt Readings from Last 3 Encounters:  07/05/22 205 lb 3.2 oz (93.1 kg)  05/09/22 199 lb 14.4 oz (90.7 kg)  04/14/22 201 lb (91.2 kg)   Alert and oriented and in no acute distress.  Respirations unlabored.  Speaking in complete sentences without difficulty.  Normal mood and speech.    Assessment & Plan:   Problem List Items Addressed This Visit   None Visit Diagnoses     COVID-19 virus infection    -  Primary   Relevant Medications   molnupiravir EUA (LAGEVRIO) 200 MG CAPS capsule   Acute cough       Acute pharyngitis, unspecified etiology          Symptom onset 2 days ago with positive COVID test  yesterday.  Molnupiravir prescribed.  Counseling on how to take medication and potential side effects.  Symptomatic management discussed.  Counseling on quarantine and isolation.  Follow-up as needed.  I am having Austin [name] start on molnupiravir EUA. I am also having him maintain his multivitamin, ferrous sulfate, lidocaine-prilocaine, warfarin, OneTouch Verio, onetouch ultrasoft, dapagliflozin propanediol, amoxicillin, losartan, metoprolol, fenofibrate, and rosuvastatin.  Meds ordered this encounter  Medications   molnupiravir EUA (LAGEVRIO) 200 MG CAPS capsule    Sig: Take 4 capsules (800 mg total) by mouth 2 (two) times daily for 5 days.    Dispense:  40 capsule    Refill:  0    Order Specific Question:   Supervising Provider    Answer:   Pricilla Holm A [[id number]]    I discussed the assessment and treatment plan with the patient. The patient was provided an opportunity to ask questions and all were answered. The patient agreed with the plan and demonstrated an understanding of the instructions.   The patient was advised to call back or seek an in-person evaluation if the symptoms worsen or if the condition fails to improve as anticipated.  I provided 16 minutes of face-to-face time during this encounter.   Harland Dingwall, NP-C [facility] [phone] (phone) [phone] (fax)  [place]

## 2022-08-01 ENCOUNTER — Ambulatory Visit (INDEPENDENT_AMBULATORY_CARE_PROVIDER_SITE_OTHER): Payer: BC Managed Care – PPO

## 2022-08-01 DIAGNOSIS — Z7901 Long term (current) use of anticoagulants: Secondary | ICD-10-CM | POA: Diagnosis not present

## 2022-08-01 LAB — POCT INR: INR: 3 (ref 2.0–3.0)

## 2022-08-01 NOTE — Progress Notes (Signed)
Continue to take 1 tablet daily except take 1 1/2 on Wednesdays. Recheck in 6 weeks. 

## 2022-08-01 NOTE — Patient Instructions (Addendum)
Pre visit review using our clinic review tool, if applicable. No additional management support is needed unless otherwise documented below in the visit note.  Continue to take 1 tablet daily except take 1 1/2 on Wednesdays. Recheck in 6 weeks. 

## 2022-08-02 ENCOUNTER — Other Ambulatory Visit: Payer: Self-pay

## 2022-08-10 DIAGNOSIS — E119 Type 2 diabetes mellitus without complications: Secondary | ICD-10-CM | POA: Diagnosis not present

## 2022-08-31 ENCOUNTER — Other Ambulatory Visit: Payer: Self-pay | Admitting: Hematology and Oncology

## 2022-08-31 DIAGNOSIS — N1831 Chronic kidney disease, stage 3a: Secondary | ICD-10-CM

## 2022-08-31 DIAGNOSIS — C469 Kaposi's sarcoma, unspecified: Secondary | ICD-10-CM

## 2022-09-07 ENCOUNTER — Inpatient Hospital Stay: Payer: BC Managed Care – PPO | Admitting: Hematology and Oncology

## 2022-09-07 ENCOUNTER — Telehealth: Payer: Self-pay | Admitting: *Deleted

## 2022-09-07 ENCOUNTER — Inpatient Hospital Stay: Payer: BC Managed Care – PPO

## 2022-09-07 ENCOUNTER — Other Ambulatory Visit: Payer: Self-pay | Admitting: Hematology and Oncology

## 2022-09-07 ENCOUNTER — Encounter: Payer: Self-pay | Admitting: Hematology and Oncology

## 2022-09-07 ENCOUNTER — Inpatient Hospital Stay: Payer: BC Managed Care – PPO | Attending: Oncology

## 2022-09-07 VITALS — BP 124/78 | HR 57 | Temp 98.7°F | Resp 18 | Ht 68.0 in | Wt 209.8 lb

## 2022-09-07 VITALS — BP 134/71 | HR 53 | Resp 16

## 2022-09-07 DIAGNOSIS — Z87891 Personal history of nicotine dependence: Secondary | ICD-10-CM | POA: Diagnosis not present

## 2022-09-07 DIAGNOSIS — I359 Nonrheumatic aortic valve disorder, unspecified: Secondary | ICD-10-CM

## 2022-09-07 DIAGNOSIS — Z7901 Long term (current) use of anticoagulants: Secondary | ICD-10-CM

## 2022-09-07 DIAGNOSIS — Z5111 Encounter for antineoplastic chemotherapy: Secondary | ICD-10-CM | POA: Diagnosis not present

## 2022-09-07 DIAGNOSIS — C469 Kaposi's sarcoma, unspecified: Secondary | ICD-10-CM

## 2022-09-07 DIAGNOSIS — N183 Chronic kidney disease, stage 3 unspecified: Secondary | ICD-10-CM | POA: Diagnosis not present

## 2022-09-07 DIAGNOSIS — N1831 Chronic kidney disease, stage 3a: Secondary | ICD-10-CM

## 2022-09-07 DIAGNOSIS — I129 Hypertensive chronic kidney disease with stage 1 through stage 4 chronic kidney disease, or unspecified chronic kidney disease: Secondary | ICD-10-CM | POA: Diagnosis not present

## 2022-09-07 LAB — CBC WITH DIFFERENTIAL (CANCER CENTER ONLY)
Abs Immature Granulocytes: 0.01 10*3/uL (ref 0.00–0.07)
Basophils Absolute: 0 10*3/uL (ref 0.0–0.1)
Basophils Relative: 1 %
Eosinophils Absolute: 0.1 10*3/uL (ref 0.0–0.5)
Eosinophils Relative: 3 %
HCT: 45.8 % (ref 39.0–52.0)
Hemoglobin: 15.9 g/dL (ref 13.0–17.0)
Immature Granulocytes: 0 %
Lymphocytes Relative: 22 %
Lymphs Abs: 0.9 10*3/uL (ref 0.7–4.0)
MCH: 28.6 pg (ref 26.0–34.0)
MCHC: 34.7 g/dL (ref 30.0–36.0)
MCV: 82.5 fL (ref 80.0–100.0)
Monocytes Absolute: 0.4 10*3/uL (ref 0.1–1.0)
Monocytes Relative: 9 %
Neutro Abs: 2.7 10*3/uL (ref 1.7–7.7)
Neutrophils Relative %: 65 %
Platelet Count: 142 10*3/uL — ABNORMAL LOW (ref 150–400)
RBC: 5.55 MIL/uL (ref 4.22–5.81)
RDW: 13.2 % (ref 11.5–15.5)
WBC Count: 4.1 10*3/uL (ref 4.0–10.5)
nRBC: 0 % (ref 0.0–0.2)

## 2022-09-07 LAB — CMP (CANCER CENTER ONLY)
ALT: 20 U/L (ref 0–44)
AST: 24 U/L (ref 15–41)
Albumin: 4 g/dL (ref 3.5–5.0)
Alkaline Phosphatase: 30 U/L — ABNORMAL LOW (ref 38–126)
Anion gap: 5 (ref 5–15)
BUN: 27 mg/dL — ABNORMAL HIGH (ref 8–23)
CO2: 27 mmol/L (ref 22–32)
Calcium: 9.1 mg/dL (ref 8.9–10.3)
Chloride: 109 mmol/L (ref 98–111)
Creatinine: 1.85 mg/dL — ABNORMAL HIGH (ref 0.61–1.24)
GFR, Estimated: 39 mL/min — ABNORMAL LOW (ref >60)
Glucose, Bld: 148 mg/dL — ABNORMAL HIGH (ref 70–99)
Potassium: 4.3 mmol/L (ref 3.5–5.1)
Sodium: 141 mmol/L (ref 135–145)
Total Bilirubin: 0.6 mg/dL (ref 0.3–1.2)
Total Protein: 6.1 g/dL — ABNORMAL LOW (ref 6.5–8.1)

## 2022-09-07 MED ORDER — DEXTROSE 5 % IV SOLN: Freq: Once | INTRAVENOUS | Status: AC

## 2022-09-07 MED ORDER — HEPARIN SOD (PORK) LOCK FLUSH 100 UNIT/ML IV SOLN
500.0000 [IU] | Freq: Once | INTRAVENOUS | Status: AC | PRN
  Administered 2022-09-07: 500 [IU]

## 2022-09-07 MED ORDER — SODIUM CHLORIDE 0.9 % IV SOLN
10.0000 mg | Freq: Once | INTRAVENOUS | Status: AC
  Administered 2022-09-07: 10 mg via INTRAVENOUS
  Filled 2022-09-07: qty 10

## 2022-09-07 MED ORDER — DOXORUBICIN HCL LIPOSOMAL CHEMO INJECTION 2 MG/ML
27.0000 mg/m2 | Freq: Once | INTRAVENOUS | Status: AC
  Administered 2022-09-07: 60 mg via INTRAVENOUS
  Filled 2022-09-07: qty 30

## 2022-09-07 MED ORDER — SODIUM CHLORIDE 0.9% FLUSH
10.0000 mL | INTRAVENOUS | Status: DC | PRN
  Administered 2022-09-07: 10 mL

## 2022-09-07 NOTE — Patient Instructions (Signed)
[facility]  Discharge Instructions: Thank you for choosing [facility] to provide your oncology and hematology care.   If you have a lab appointment with the [facility], please go directly to the [facility] and check in at the registration area.   Wear comfortable clothing and clothing appropriate for easy access to any Portacath or PICC line.   We strive to give you quality time with your provider. You may need to reschedule your appointment if you arrive late (15 or more minutes).  Arriving late affects you and other patients whose appointments are after yours.  Also, if you miss three or more appointments without notifying the office, you may be dismissed from the clinic at the provider's discretion.      For prescription refill requests, have your pharmacy contact our office and allow 72 hours for refills to be completed.    Today you received the following chemotherapy and/or immunotherapy agents Doxil      To help prevent nausea and vomiting after your treatment, we encourage you to take your nausea medication as directed.  BELOW ARE SYMPTOMS THAT SHOULD BE REPORTED IMMEDIATELY: *FEVER GREATER THAN 100.4 F (38 C) OR HIGHER *CHILLS OR SWEATING *NAUSEA AND VOMITING THAT IS NOT CONTROLLED WITH YOUR NAUSEA MEDICATION *UNUSUAL SHORTNESS OF BREATH *UNUSUAL BRUISING OR BLEEDING *URINARY PROBLEMS (pain or burning when urinating, or frequent urination) *BOWEL PROBLEMS (unusual diarrhea, constipation, pain near the anus) TENDERNESS IN MOUTH AND THROAT WITH OR WITHOUT PRESENCE OF ULCERS (sore throat, sores in mouth, or a toothache) UNUSUAL RASH, SWELLING OR PAIN  UNUSUAL VAGINAL DISCHARGE OR ITCHING   Items with * indicate a potential emergency and should be followed up as soon as possible or go to the Emergency Department if any problems should occur.  Please show the CHEMOTHERAPY ALERT CARD or IMMUNOTHERAPY ALERT CARD at check-in  to the Emergency Department and triage nurse.  Should you have questions after your visit or need to cancel or reschedule your appointment, please contact [facility]  Dept: [phone]  and follow the prompts.  Office hours are 8:00 a.m. to 4:30 p.m. Monday - Friday. Please note that voicemails left after 4:00 p.m. may not be returned until the following business day.  We are closed weekends and major holidays. You have access to a nurse at all times for urgent questions. Please call the main number to the clinic Dept: [phone] and follow the prompts.   For any non-urgent questions, you may also contact your provider using MyChart. We now offer e-Visits for anyone 22 and older to request care online for non-urgent symptoms. For details visit [email or web address].   Also download the MyChart app! Go to the app store, search "MyChart", open the app, select [facility], and log in with your MyChart username and password.

## 2022-09-07 NOTE — Telephone Encounter (Signed)
Echocardiogram order placed.  Message to patient via MyChart to let him know he will be called to schedule as it appears he is at oncology today for treatment.

## 2022-09-07 NOTE — Assessment & Plan Note (Signed)
I have reviewed recommendation from her prior physicians and specialist for treatment of recurrent sarcoma For now, we will continue liposomal doxorubicin every 4 months I recommend echocardiogram to be done as his last echo was 2 years ago His cardiologist was contacted and will be arranging for this We will proceed with treatment I have informed the patient I have also ordered additional workup to rule out other form of immunodeficiency that cause him to be predisposed to recurrent Kaposi's sarcoma

## 2022-09-07 NOTE — Assessment & Plan Note (Signed)
He is doing well and is under strict guidance from cardiology office He denies recent bleeding complications

## 2022-09-07 NOTE — Assessment & Plan Note (Signed)
He is on multiple medications and under care of cardiologist Monitor closely He does not need dose adjustment

## 2022-09-07 NOTE — Telephone Encounter (Signed)
-----   Message from Heath Lark, MD sent at 09/07/2022 11:09 AM EST ----- Regarding: RE: echo Thanks so much ----- Message ----- From: Burnell Blanks, MD Sent: 09/07/2022  11:03 AM EST To: Heath Lark, MD; Rodman Key, RN Subject: RE: echo                                       Sure. That is ok with me. We will call him and set it up.   Derrich Gaby, Can we get him set up for an echo?   Thanks, chris ----- Message ----- From: Heath Lark, MD Sent: 09/07/2022  10:46 AM EST To: Burnell Blanks, MD Subject: echo                                           Hi,  I am taking care of this patient now that Dr. Alen Blew has left With his current chemo treatment, I recommend repeat ECHO to rule out cardiomyopathy, but he told me when you saw him recently, you have suggested echo next year He has been getting liposomal doxorubicin; I would prefer to repeat it sooner than later Would that be ok with you?

## 2022-09-07 NOTE — Progress Notes (Signed)
[place] FOLLOW-UP progress notes  Patient Care Team: [name], MD as PCP - General Angelena Form Annita Brod, MD as PCP - Cardiology (Cardiology)  CHIEF COMPLAINTS/PURPOSE OF VISIT:  Recurrent Kaposi's sarcoma, on maintenance treatment with liposomal doxorubicin  HISTORY OF PRESENTING ILLNESS:  Austin [name] 67 y.o. male was transferred to my care after his prior physician has left.  I reviewed the patient's records extensive and collaborated the history with the patient. Summary of his history is as follows: Oncology History  Kaposi sarcoma ([place])  11/09/2014 Pathology Results   A.  Outside case, [phone], [facility] [place]ondall Allegra, [place].  Date of procedure 11-09-14:   Skin, right lower leg, biopsy:     Kaposi sarcoma. See comment.   Comment:  Immunohistochemistry performed at the outside institution was reviewed.  The tumor cells are positive for HHV8, consistent with Kaposi sarcoma.   02/03/2015 Initial Diagnosis   Kaposi sarcoma ([place])   04/04/2018 - 02/08/2022 Chemotherapy   Patient is on Treatment Plan : KAPOSIS SARCOMA Liposomal Doxorubicin q28d     05/09/2022 -  Chemotherapy   Patient is on Treatment Plan : KAPOSIS SARCOMA Liposomal Doxorubicin (20) q28d     08/31/2022 Cancer Staging   Staging form: Soft Tissue Sarcoma, AJCC 7th Edition - Clinical stage from 08/31/2022: Stage Unknown (rTX, N0, M0) - Signed by Heath Lark, MD on 08/31/2022 Stage prefix: Recurrence Biopsy of metastatic site performed: Yes    He has been getting treatment every 4 months He denies side effects from treatment such as mucositis, pancytopenia or hand-foot syndrome His last echocardiogram was done in 2022 According to the patient, his current infusion duration interval is working well for him He denies bleeding complications from anticoagulation therapy  MEDICAL HISTORY:  Past Medical History:  Diagnosis Date   Anemia    iron def   Aortic valve prosthesis  present    a. SJM - [facility] 2003, chronic coumadin.   Back pain    CKD (chronic kidney disease), stage II    Coronary artery disease    not sure where this dx came from - pt denies   Family history of melanoma    Hearing loss    Hyperlipidemia    Hypertension    Internal hemorrhoid    Kaposi sarcoma ([place])    Obesity    Pericardial effusion    Thoracic aortic aneurysm ([place])    a. s/p dissection and repair @ [facility] 2003 with SJM AVR with residual findings followed by CT.   Thyroid disease     SURGICAL HISTORY: Past Surgical History:  Procedure Laterality Date   ABDOMINAL AORTIC ANEURYSM REPAIR     AORTIC VALVE REPLACEMENT     impingment     right shoulder   IR IMAGING GUIDED PORT INSERTION  09/17/2020   KNEE ARTHROSCOPY     left   WISDOM TOOTH EXTRACTION      SOCIAL HISTORY: Social History   Socioeconomic History   Marital status: Single    Spouse name: Not on file   Number of children: 0   Years of education: MDIV   Highest education level: Not on file  Occupational History   Occupation: Editor, commissioning    Employer: [employer]  Tobacco Use   Smoking status: Former    Packs/day: 1.00    Years: 15.00    Total pack years: 15.00    Types: Cigarettes    Quit date: 10/10/2001    Years since quitting:  20.9   Smokeless tobacco: Never  Substance and Sexual Activity   Alcohol use: Yes    Comment: one drink per day   Drug use: No   Sexual activity: Not Currently  Other Topics Concern   Not on file  Social History Narrative   Pt lives at home alone.   Caffeine Use: Less than 1 cup daily.   Social Determinants of Health   Financial Resource Strain: Not on file  Food Insecurity: Not on file  Transportation Needs: Not on file  Physical Activity: Not on file  Stress: Not on file  Social Connections: Not on file  Intimate Partner Violence: Not on file    FAMILY HISTORY: Family History  Problem Relation Age of Onset   Stroke Mother        late 68s    Stroke Father 30   Liver disease Sister        Sclerosing cholegitis necessitating a liver transplant   Melanoma Maternal Uncle        dx in his 32s   Heart disease Paternal Aunt    Other Maternal Grandfather        died under the age of 71 due to a health related issue   Colon cancer Neg Hx     ALLERGIES:  is allergic to celecoxib.  MEDICATIONS:  Current Outpatient Medications  Medication Sig Dispense Refill   amoxicillin (AMOXIL) 500 MG capsule Take 4 capsules by mouth 4 hours prior to appointment (Patient not taking: Reported on 07/27/2022) 12 capsule 1   dapagliflozin propanediol (FARXIGA) 5 MG TABS tablet Take 1 tablet (5 mg total) by mouth daily. 90 tablet 3   fenofibrate 160 MG tablet TAKE 1 TABLET DAILY 90 tablet 3   ferrous sulfate 325 (65 FE) MG tablet Take 1 tablet (325 mg total) by mouth daily with breakfast. 30 tablet 3   glucose blood (ONETOUCH VERIO) test strip Use to check blood sugars daily 100 each 3   Lancets (ONETOUCH ULTRASOFT) lancets Use as instructed 100 each 12   lidocaine-prilocaine (EMLA) cream Apply 1 application topically as needed. 30 g 0   losartan (COZAAR) 25 MG tablet TAKE 1 TABLET DAILY 90 tablet 3   metoprolol (TOPROL-XL) 200 MG 24 hr tablet TAKE 1 TABLET DAILY 90 tablet 3   Multiple Vitamin (MULTIVITAMIN) tablet Take 1 tablet by mouth daily.     rosuvastatin (CRESTOR) 40 MG tablet TAKE 1 TABLET DAILY 90 tablet 3   warfarin (COUMADIN) 5 MG tablet TAKE 1 TABLET BY MOUTH DAILY EXCEPT, TAKE ONE AND ONE-HALF TABLETS ON FRIDAYS OR TAKE AS DIRECTED BY ANTICOAGULATION CLINIC 100 tablet 3   No current facility-administered medications for this visit.   Facility-Administered Medications Ordered in Other Visits  Medication Dose Route Frequency Provider Last Rate Last Admin   dexamethasone (DECADRON) 10 mg in sodium chloride 0.9 % 50 mL IVPB  10 mg Intravenous Once Alvy Bimler, Ravon Mcilhenny, MD       DOXOrubicin HCL LIPOSOMAL (DOXIL) 60 mg in dextrose 5 % 250 mL chemo  infusion  27 mg/m2 (Treatment Plan Recorded) Intravenous Once Alvy Bimler, Neave Lenger, MD       heparin lock flush 100 unit/mL  500 Units Intracatheter Once PRN Alvy Bimler, Amiri Riechers, MD       sodium chloride flush (NS) 0.9 % injection 10 mL  10 mL Intracatheter PRN Alvy Bimler, Kyrielle Urbanski, MD        REVIEW OF SYSTEMS:   Constitutional: Denies fevers, chills or abnormal night sweats Eyes: Denies blurriness  of vision, double vision or watery eyes Ears, nose, mouth, throat, and face: Denies mucositis or sore throat Respiratory: Denies cough, dyspnea or wheezes Cardiovascular: Denies palpitation, chest discomfort or lower extremity swelling Gastrointestinal:  Denies nausea, heartburn or change in bowel habits Skin: Denies abnormal skin rashes Lymphatics: Denies new lymphadenopathy or easy bruising Neurological:Denies numbness, tingling or new weaknesses Behavioral/Psych: Mood is stable, no new changes  All other systems were reviewed with the patient and are negative.  PHYSICAL EXAMINATION: ECOG PERFORMANCE STATUS: 0 - Asymptomatic  Vitals:   09/07/22 1038  BP: 124/78  Pulse: (!) 57  Resp: 18  Temp: 98.7 F (37.1 C)  SpO2: 100%   Filed Weights   09/07/22 1038  Weight: 209 lb 12.8 oz (95.2 kg)    GENERAL:alert, no distress and comfortable SKIN: skin color, texture, turgor are normal, no rashes or significant lesions EYES: normal, conjunctiva are pink and non-injected, sclera clear OROPHARYNX:no exudate, normal lips, buccal mucosa, and tongue  NECK: supple, thyroid normal size, non-tender, without nodularity LYMPH:  no palpable lymphadenopathy in the cervical, axillary or inguinal LUNGS: clear to auscultation and percussion with normal breathing effort HEART: regular rate & rhythm with prosthetic click with mild bilateral lower extremity edema ABDOMEN:abdomen soft, non-tender and normal bowel sounds Musculoskeletal:no cyanosis of digits and no clubbing  PSYCH: alert & oriented x 3 with fluent speech NEURO: no  focal motor/sensory deficits  LABORATORY DATA:  I have reviewed the data as listed Lab Results  Component Value Date   WBC 4.1 09/07/2022   HGB 15.9 09/07/2022   HCT 45.8 09/07/2022   MCV 82.5 09/07/2022   PLT 142 (L) 09/07/2022   Recent Labs    10/11/21 1656 10/21/21 1006 02/08/22 0831 05/09/22 0842 09/07/22 1007  NA 130*   < > 141 141 141  K 4.3   < > 4.2 4.3 4.3  CL 95*   < > 109 110 109  CO2 27   < > '27 27 27  '$ GLUCOSE 479*   < > 201* 225* 148*  BUN 27*   < > 23 25* 27*  CREATININE 1.57*   < > 1.38* 1.47* 1.85*  CALCIUM 9.3   < > 9.4 9.0 9.1  GFRNONAA  --    < > 56* 52* 39*  PROT 6.2   < > 6.2* 6.6 6.1*  ALBUMIN 4.3   < > 4.1 4.2 4.0  AST 30   < > '25 24 24  '$ ALT 32   < > '22 18 20  '$ ALKPHOS 42   < > 22* 23* 30*  BILITOT 0.9   < > 0.6 0.6 0.6  BILIDIR 0.2  --   --   --   --    < > = values in this interval not displayed.    RADIOGRAPHIC STUDIES: I have personally reviewed the radiological images as listed and agreed with the findings in the report. No results found.  ASSESSMENT & PLAN:  Kaposi sarcoma ([place]) I have reviewed recommendation from her prior physicians and specialist for treatment of recurrent sarcoma For now, we will continue liposomal doxorubicin every 4 months I recommend echocardiogram to be done as his last echo was 2 years ago His cardiologist was contacted and will be arranging for this We will proceed with treatment I have informed the patient I have also ordered additional workup to rule out other form of immunodeficiency that cause him to be predisposed to recurrent Kaposi's sarcoma  CKD (chronic kidney disease), stage  III [facility]) He is on multiple medications and under care of cardiologist Monitor closely He does not need dose adjustment  Chronic anticoagulation He is doing well and is under strict guidance from cardiology office He denies recent bleeding complications  No orders of the defined types were placed in this  encounter.   All questions were answered. The patient knows to call the clinic with any problems, questions or concerns. The total time spent in the appointment was 30 minutes encounter with patients including review of chart and various tests results, discussions about plan of care and coordination of care plan   Heath Lark, MD 09/07/2022 11:32 AM

## 2022-09-08 ENCOUNTER — Other Ambulatory Visit: Payer: Self-pay

## 2022-09-08 LAB — KAPPA/LAMBDA LIGHT CHAINS
Kappa free light chain: 23.3 mg/L — ABNORMAL HIGH (ref 3.3–19.4)
Kappa, lambda light chain ratio: 1.93 — ABNORMAL HIGH (ref 0.26–1.65)
Lambda free light chains: 12.1 mg/L (ref 5.7–26.3)

## 2022-09-12 ENCOUNTER — Ambulatory Visit (INDEPENDENT_AMBULATORY_CARE_PROVIDER_SITE_OTHER): Payer: BC Managed Care – PPO

## 2022-09-12 DIAGNOSIS — Z7901 Long term (current) use of anticoagulants: Secondary | ICD-10-CM | POA: Diagnosis not present

## 2022-09-12 LAB — POCT INR: INR: 3 (ref 2.0–3.0)

## 2022-09-12 NOTE — Patient Instructions (Addendum)
Pre visit review using our clinic review tool, if applicable. No additional management support is needed unless otherwise documented below in the visit note.  Continue to take 1 tablet daily except take 1 1/2 on Wednesdays. Recheck in 6 weeks.

## 2022-09-12 NOTE — Progress Notes (Signed)
Continue to take 1 tablet daily except take 1 1/2 on Wednesdays. Recheck in 6 weeks.

## 2022-09-13 LAB — MULTIPLE MYELOMA PANEL, SERUM
Albumin SerPl Elph-Mcnc: 3.9 g/dL (ref 2.9–4.4)
Albumin/Glob SerPl: 1.7 (ref 0.7–1.7)
Alpha 1: 0.2 g/dL (ref 0.0–0.4)
Alpha2 Glob SerPl Elph-Mcnc: 0.5 g/dL (ref 0.4–1.0)
B-Globulin SerPl Elph-Mcnc: 0.8 g/dL (ref 0.7–1.3)
Gamma Glob SerPl Elph-Mcnc: 1 g/dL (ref 0.4–1.8)
Globulin, Total: 2.4 g/dL (ref 2.2–3.9)
IgA: 87 mg/dL (ref 61–437)
IgG (Immunoglobin G), Serum: 981 mg/dL (ref 603–1613)
IgM (Immunoglobulin M), Srm: 48 mg/dL (ref 20–172)
Total Protein ELP: 6.3 g/dL (ref 6.0–8.5)

## 2022-09-14 ENCOUNTER — Telehealth: Payer: Self-pay

## 2022-09-14 NOTE — Telephone Encounter (Signed)
Called and given below message. He verbalized understanding and appreciated the call. 

## 2022-09-14 NOTE — Telephone Encounter (Signed)
-----   Message from Heath Lark, MD sent at 09/14/2022  7:56 AM EST ----- Pls let him know all the other labs I ordered are normal Nothing new to add

## 2022-09-25 ENCOUNTER — Encounter: Payer: Self-pay | Admitting: Hematology and Oncology

## 2022-10-09 ENCOUNTER — Ambulatory Visit (HOSPITAL_COMMUNITY): Payer: BC Managed Care – PPO | Attending: Cardiovascular Disease

## 2022-10-09 DIAGNOSIS — C469 Kaposi's sarcoma, unspecified: Secondary | ICD-10-CM | POA: Diagnosis not present

## 2022-10-09 DIAGNOSIS — I359 Nonrheumatic aortic valve disorder, unspecified: Secondary | ICD-10-CM | POA: Diagnosis not present

## 2022-10-09 LAB — ECHOCARDIOGRAM COMPLETE
AR max vel: 1.36 cm2
AV Area VTI: 1.5 cm2
AV Area mean vel: 1.49 cm2
AV Mean grad: 11.4 mmHg
AV Peak grad: 43.6 mmHg
Ao pk vel: 3.3 m/s
Area-P 1/2: 3.54 cm2
P 1/2 time: 533 msec
S' Lateral: 3 cm

## 2022-10-16 DIAGNOSIS — D649 Anemia, unspecified: Secondary | ICD-10-CM | POA: Diagnosis not present

## 2022-10-16 DIAGNOSIS — N1832 Chronic kidney disease, stage 3b: Secondary | ICD-10-CM | POA: Diagnosis not present

## 2022-10-16 DIAGNOSIS — I129 Hypertensive chronic kidney disease with stage 1 through stage 4 chronic kidney disease, or unspecified chronic kidney disease: Secondary | ICD-10-CM | POA: Diagnosis not present

## 2022-10-24 ENCOUNTER — Ambulatory Visit: Payer: BC Managed Care – PPO

## 2022-10-24 DIAGNOSIS — Z7901 Long term (current) use of anticoagulants: Secondary | ICD-10-CM | POA: Diagnosis not present

## 2022-10-24 LAB — POCT INR: INR: 2.6 (ref 2.0–3.0)

## 2022-10-24 NOTE — Progress Notes (Signed)
Continue to take 1 tablet daily except take 1 1/2 on Wednesdays. Recheck in 6 weeks. 

## 2022-10-24 NOTE — Patient Instructions (Addendum)
Pre visit review using our clinic review tool, if applicable. No additional management support is needed unless otherwise documented below in the visit note.  Continue to take 1 tablet daily except take 1 1/2 on Wednesdays. Recheck in 6 weeks. 

## 2022-10-31 ENCOUNTER — Other Ambulatory Visit: Payer: Self-pay

## 2022-11-05 ENCOUNTER — Other Ambulatory Visit: Payer: Self-pay

## 2022-12-05 ENCOUNTER — Ambulatory Visit: Payer: BC Managed Care – PPO

## 2022-12-05 DIAGNOSIS — Z7901 Long term (current) use of anticoagulants: Secondary | ICD-10-CM | POA: Diagnosis not present

## 2022-12-05 LAB — POCT INR: INR: 2.5 (ref 2.0–3.0)

## 2022-12-05 NOTE — Patient Instructions (Addendum)
Pre visit review using our clinic review tool, if applicable. No additional management support is needed unless otherwise documented below in the visit note.  Continue to take 1 tablet daily except take 1 1/2 on Wednesdays. Recheck in 6 weeks. 

## 2022-12-05 NOTE — Progress Notes (Signed)
Continue to take 1 tablet daily except take 1 1/2 on Wednesdays. Recheck in 6 weeks. 

## 2022-12-06 ENCOUNTER — Other Ambulatory Visit: Payer: Self-pay

## 2022-12-28 ENCOUNTER — Other Ambulatory Visit: Payer: Self-pay | Admitting: Internal Medicine

## 2022-12-28 ENCOUNTER — Other Ambulatory Visit: Payer: Self-pay

## 2022-12-28 DIAGNOSIS — Z7901 Long term (current) use of anticoagulants: Secondary | ICD-10-CM

## 2023-01-11 MED FILL — Dexamethasone Sodium Phosphate Inj 100 MG/10ML: INTRAMUSCULAR | Qty: 1 | Status: AC

## 2023-01-12 ENCOUNTER — Inpatient Hospital Stay: Payer: BC Managed Care – PPO | Attending: Hematology and Oncology

## 2023-01-12 ENCOUNTER — Encounter: Payer: Self-pay | Admitting: Hematology and Oncology

## 2023-01-12 ENCOUNTER — Inpatient Hospital Stay: Payer: BC Managed Care – PPO

## 2023-01-12 ENCOUNTER — Inpatient Hospital Stay (HOSPITAL_BASED_OUTPATIENT_CLINIC_OR_DEPARTMENT_OTHER): Payer: BC Managed Care – PPO | Admitting: Hematology and Oncology

## 2023-01-12 ENCOUNTER — Other Ambulatory Visit: Payer: Self-pay

## 2023-01-12 VITALS — BP 109/54 | HR 56 | Temp 98.3°F | Resp 18 | Ht 68.0 in | Wt 211.4 lb

## 2023-01-12 DIAGNOSIS — N183 Chronic kidney disease, stage 3 unspecified: Secondary | ICD-10-CM | POA: Diagnosis not present

## 2023-01-12 DIAGNOSIS — Z95828 Presence of other vascular implants and grafts: Secondary | ICD-10-CM

## 2023-01-12 DIAGNOSIS — C469 Kaposi's sarcoma, unspecified: Secondary | ICD-10-CM | POA: Insufficient documentation

## 2023-01-12 DIAGNOSIS — Z5111 Encounter for antineoplastic chemotherapy: Secondary | ICD-10-CM | POA: Insufficient documentation

## 2023-01-12 DIAGNOSIS — N1831 Chronic kidney disease, stage 3a: Secondary | ICD-10-CM

## 2023-01-12 DIAGNOSIS — R972 Elevated prostate specific antigen [PSA]: Secondary | ICD-10-CM

## 2023-01-12 LAB — CMP (CANCER CENTER ONLY)
ALT: 25 U/L (ref 0–44)
AST: 26 U/L (ref 15–41)
Albumin: 3.9 g/dL (ref 3.5–5.0)
Alkaline Phosphatase: 27 U/L — ABNORMAL LOW (ref 38–126)
Anion gap: 4 — ABNORMAL LOW (ref 5–15)
BUN: 25 mg/dL — ABNORMAL HIGH (ref 8–23)
CO2: 27 mmol/L (ref 22–32)
Calcium: 9.4 mg/dL (ref 8.9–10.3)
Chloride: 111 mmol/L (ref 98–111)
Creatinine: 1.48 mg/dL — ABNORMAL HIGH (ref 0.61–1.24)
GFR, Estimated: 52 mL/min — ABNORMAL LOW (ref >60)
Glucose, Bld: 155 mg/dL — ABNORMAL HIGH (ref 70–99)
Potassium: 4.2 mmol/L (ref 3.5–5.1)
Sodium: 142 mmol/L (ref 135–145)
Total Bilirubin: 0.6 mg/dL (ref 0.3–1.2)
Total Protein: 6 g/dL — ABNORMAL LOW (ref 6.5–8.1)

## 2023-01-12 LAB — CBC WITH DIFFERENTIAL (CANCER CENTER ONLY)
Abs Immature Granulocytes: 0.01 10*3/uL (ref 0.00–0.07)
Basophils Absolute: 0 10*3/uL (ref 0.0–0.1)
Basophils Relative: 1 %
Eosinophils Absolute: 0.1 10*3/uL (ref 0.0–0.5)
Eosinophils Relative: 2 %
HCT: 45.3 % (ref 39.0–52.0)
Hemoglobin: 15.3 g/dL (ref 13.0–17.0)
Immature Granulocytes: 0 %
Lymphocytes Relative: 24 %
Lymphs Abs: 1 10*3/uL (ref 0.7–4.0)
MCH: 28.5 pg (ref 26.0–34.0)
MCHC: 33.8 g/dL (ref 30.0–36.0)
MCV: 84.4 fL (ref 80.0–100.0)
Monocytes Absolute: 0.5 10*3/uL (ref 0.1–1.0)
Monocytes Relative: 11 %
Neutro Abs: 2.7 10*3/uL (ref 1.7–7.7)
Neutrophils Relative %: 62 %
Platelet Count: 159 10*3/uL (ref 150–400)
RBC: 5.37 MIL/uL (ref 4.22–5.81)
RDW: 13.1 % (ref 11.5–15.5)
WBC Count: 4.3 10*3/uL (ref 4.0–10.5)
nRBC: 0 % (ref 0.0–0.2)

## 2023-01-12 MED ORDER — SODIUM CHLORIDE 0.9% FLUSH
10.0000 mL | INTRAVENOUS | Status: DC | PRN
  Administered 2023-01-12: 10 mL

## 2023-01-12 MED ORDER — DOXORUBICIN HCL LIPOSOMAL CHEMO INJECTION 2 MG/ML
27.0000 mg/m2 | Freq: Once | INTRAVENOUS | Status: AC
  Administered 2023-01-12: 60 mg via INTRAVENOUS
  Filled 2023-01-12: qty 30

## 2023-01-12 MED ORDER — SODIUM CHLORIDE 0.9% FLUSH
10.0000 mL | Freq: Once | INTRAVENOUS | Status: AC
  Administered 2023-01-12: 10 mL

## 2023-01-12 MED ORDER — SODIUM CHLORIDE 0.9 % IV SOLN
10.0000 mg | Freq: Once | INTRAVENOUS | Status: AC
  Administered 2023-01-12: 10 mg via INTRAVENOUS
  Filled 2023-01-12: qty 10

## 2023-01-12 MED ORDER — DEXTROSE 5 % IV SOLN: Freq: Once | INTRAVENOUS | Status: AC

## 2023-01-12 MED ORDER — HEPARIN SOD (PORK) LOCK FLUSH 100 UNIT/ML IV SOLN
500.0000 [IU] | Freq: Once | INTRAVENOUS | Status: AC | PRN
  Administered 2023-01-12: 500 [IU]

## 2023-01-12 NOTE — Assessment & Plan Note (Signed)
I have reviewed recommendation from her prior physicians and specialist for treatment of recurrent sarcoma For now, we will continue liposomal doxorubicin every 4 months Clinically, he does not have any evidence of relapse on exam When not following with imaging study He had repeat echocardiogram done recently that showed preserved ejection fraction We will monitor echocardiogram every 1 to 2 years We will proceed with treatment

## 2023-01-12 NOTE — Progress Notes (Signed)
[facility] OFFICE PROGRESS NOTE  Patient Care Team: Corwin Levins, MD as PCP - Melonie Florida, Nile Dear, MD as PCP - Cardiology (Cardiology)  ASSESSMENT & PLAN:  Kaposi sarcoma [facility]) I have reviewed recommendation from her prior physicians and specialist for treatment of recurrent sarcoma For now, we will continue liposomal doxorubicin every 4 months Clinically, he does not have any evidence of relapse on exam When not following with imaging study He had repeat echocardiogram done recently that showed preserved ejection fraction We will monitor echocardiogram every 1 to 2 years We will proceed with treatment  CKD (chronic kidney disease), stage III ([facility]) He is on multiple medications and under care of cardiologist Monitor closely He does not need dose adjustment  No orders of the defined types were placed in this encounter.   All questions were answered. The patient knows to call the clinic with any problems, questions or concerns. The total time spent in the appointment was 20 minutes encounter with patients including review of chart and various tests results, discussions about plan of care and coordination of care plan   Artis Delay, MD 01/12/2023 12:28 PM  INTERVAL HISTORY: Please see below for problem oriented charting. he returns for treatment follow-up He tolerated last cycle therapy well No recent signs or symptoms of congestive heart failure  REVIEW OF SYSTEMS:   Constitutional: Denies fevers, chills or abnormal weight loss Eyes: Denies blurriness of vision Ears, nose, mouth, throat, and face: Denies mucositis or sore throat Respiratory: Denies cough, dyspnea or wheezes Cardiovascular: Denies palpitation, chest discomfort or lower extremity swelling Gastrointestinal:  Denies nausea, heartburn or change in bowel habits Skin: Denies abnormal skin rashes Lymphatics: Denies new lymphadenopathy or easy bruising Neurological:Denies numbness,  tingling or new weaknesses Behavioral/Psych: Mood is stable, no new changes  All other systems were reviewed with the patient and are negative.  I have reviewed the past medical history, past surgical history, social history and family history with the patient and they are unchanged from previous note.  ALLERGIES:  is allergic to celecoxib.  MEDICATIONS:  Current Outpatient Medications  Medication Sig Dispense Refill   amoxicillin (AMOXIL) 500 MG capsule Take 4 capsules by mouth 4 hours prior to appointment (Patient not taking: Reported on 07/27/2022) 12 capsule 1   dapagliflozin propanediol (FARXIGA) 5 MG TABS tablet Take 1 tablet (5 mg total) by mouth daily. 90 tablet 3   fenofibrate 160 MG tablet TAKE 1 TABLET DAILY 90 tablet 3   ferrous sulfate 325 (65 FE) MG tablet Take 1 tablet (325 mg total) by mouth daily with breakfast. 30 tablet 3   glucose blood (ONETOUCH VERIO) test strip Use to check blood sugars daily 100 each 3   Lancets (ONETOUCH ULTRASOFT) lancets Use as instructed 100 each 12   lidocaine-prilocaine (EMLA) cream Apply 1 application topically as needed. 30 g 0   losartan (COZAAR) 25 MG tablet TAKE 1 TABLET DAILY 90 tablet 3   metoprolol (TOPROL-XL) 200 MG 24 hr tablet TAKE 1 TABLET DAILY 90 tablet 3   Multiple Vitamin (MULTIVITAMIN) tablet Take 1 tablet by mouth daily.     rosuvastatin (CRESTOR) 40 MG tablet TAKE 1 TABLET DAILY 90 tablet 3   warfarin (COUMADIN) 5 MG tablet TAKE ONE TABLET DAILY EXCEPT, TAKE ONE AND ONE-HALF TABLETS ON FRIDAYS OR TAKE AS DIRECTED BY ANTICOAGULATION CLINIC 100 tablet 3   No current facility-administered medications for this visit.   Facility-Administered Medications Ordered in Other Visits  Medication Dose Route  Frequency Provider Last Rate Last Admin   DOXOrubicin HCL LIPOSOMAL (DOXIL) 60 mg in dextrose 5 % 250 mL chemo infusion  27 mg/m2 (Treatment Plan Recorded) Intravenous Once Bertis Ruddy, Christabella Alvira, MD 280 mL/hr at 01/12/23 1204 60 mg at 01/12/23  1204   heparin lock flush 100 unit/mL  500 Units Intracatheter Once PRN Bertis Ruddy, Ashanti Littles, MD       sodium chloride flush (NS) 0.9 % injection 10 mL  10 mL Intracatheter PRN Artis Delay, MD        SUMMARY OF ONCOLOGIC HISTORY: Oncology History  Kaposi sarcoma ([facility])  11/09/2014 Pathology Results   A.  Outside case, [phone], [facility] Marcy Panning, [place].  Date of procedure 11-09-14:   Skin, right lower leg, biopsy:     Kaposi sarcoma. See comment.   Comment:  Immunohistochemistry performed at the outside institution was reviewed.  The tumor cells are positive for HHV8, consistent with Kaposi sarcoma.   02/03/2015 Initial Diagnosis   Kaposi sarcoma ([facility])   04/04/2018 - 02/08/2022 Chemotherapy   Patient is on Treatment Plan : KAPOSIS SARCOMA Liposomal Doxorubicin q28d     05/09/2022 -  Chemotherapy   Patient is on Treatment Plan : KAPOSIS SARCOMA Liposomal Doxorubicin (20) q28d     08/31/2022 Cancer Staging   Staging form: Soft Tissue Sarcoma, AJCC 7th Edition - Clinical stage from 08/31/2022: Stage Unknown (rTX, N0, M0) - Signed by Artis Delay, MD on 08/31/2022 Stage prefix: Recurrence Biopsy of metastatic site performed: Yes   10/09/2022 Echocardiogram   1. Moderate septal hypertrophy with otherwise mild concentric LVH. Left ventricular ejection fraction, by estimation, is 60 to 65%. Left ventricular ejection fraction by PLAX is 64 %. The left ventricle has  normal function. The left ventricle has no regional wall motion abnormalities. There is mild left ventricular hypertrophy of the septal segment. Left ventricular diastolic parameters are consistent with Grade II diastolic dysfunction (pseudonormalization).  Elevated left ventricular end-diastolic pressure.   2. Right ventricular systolic function is normal. The right ventricular size is normal. There is normal pulmonary artery systolic pressure.   3. Left atrial size was moderately dilated.   4. Right atrial size was moderately  dilated.   5. The mitral valve is normal in structure. Trivial mitral valve  regurgitation. No evidence of mitral stenosis.   6. Prosthetic valve mean gradient has increased from 18 mmHg in 2022 to 22 mmHg now. However, the acceleration time is 0.87 ms and DVI 0.31 consistent with a normally functioning mechanical valve. 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 The aortic valve has been repaired/replaced. Aortic valve regurgitation is trivial. Mild to moderate aortic valve stenosis. Aortic regurgitation PHT measures 533 msec. Aortic valve area, by VTI measures 1.50 cm. Aortic valve mean gradient measures 11.4 mmHg. Aortic valve Vmax measures 3.30 m/s.   7. Aortic root/ascending aorta has been repaired/replaced. There is mild dilatation of the aortic root, measuring 40 mm. There is mild dilatation of the ascending aorta, measuring 36 mm.   8. The inferior vena cava is normal in size with greater than 50% respiratory variability, suggesting right atrial pressure of 3 mmHg.      PHYSICAL EXAMINATION: ECOG PERFORMANCE STATUS: 0 - Asymptomatic  Vitals:   01/12/23 1027  BP: (!) 109/54  Pulse: (!) 56  Resp: 18  Temp: 98.3 F (36.8 C)  SpO2: 99%   Filed Weights   01/12/23 1027  Weight: 211 lb 6.4 oz (95.9 kg)    GENERAL:alert, no distress and  comfortable SKIN: Bilateral lower extremity show thickened skin with discoloration but no evidence of skin nodules NEURO: alert & oriented x 3 with fluent speech, no focal motor/sensory deficits  LABORATORY DATA:  I have reviewed the data as listed    Component Value Date/Time   NA 142 01/12/2023 1006   NA 141 07/08/2021 1029   NA 143 11/03/2016 0908   K 4.2 01/12/2023 1006   K 5.0 11/03/2016 0908   CL 111 01/12/2023 1006   CO2 27 01/12/2023 1006   CO2 27 11/03/2016 0908   GLUCOSE 155 (H) 01/12/2023 1006   GLUCOSE 70 11/03/2016 0908   BUN 25 (H) 01/12/2023 1006   BUN 31 (H) 07/08/2021 1029   BUN 33.5 (H) 11/03/2016 0908    CREATININE 1.48 (H) 01/12/2023 1006   CREATININE 1.5 (H) 11/03/2016 0908   CALCIUM 9.4 01/12/2023 1006   CALCIUM 9.5 11/03/2016 0908   PROT 6.0 (L) 01/12/2023 1006   PROT 6.4 11/03/2016 0908   ALBUMIN 3.9 01/12/2023 1006   ALBUMIN 4.2 11/03/2016 0908   AST 26 01/12/2023 1006   AST 29 11/03/2016 0908   ALT 25 01/12/2023 1006   ALT 29 11/03/2016 0908   ALKPHOS 27 (L) 01/12/2023 1006   ALKPHOS 25 (L) 11/03/2016 0908   BILITOT 0.6 01/12/2023 1006   BILITOT 0.70 11/03/2016 0908   GFRNONAA 52 (L) 01/12/2023 1006   GFRAA 53 (L) 05/26/2019 1020   GFRAA 58 (L) 02/20/2019 0922    No results found for: "SPEP", "UPEP"  Lab Results  Component Value Date   WBC 4.3 01/12/2023   NEUTROABS 2.7 01/12/2023   HGB 15.3 01/12/2023   HCT 45.3 01/12/2023   MCV 84.4 01/12/2023   PLT 159 01/12/2023      Chemistry      Component Value Date/Time   NA 142 01/12/2023 1006   NA 141 07/08/2021 1029   NA 143 11/03/2016 0908   K 4.2 01/12/2023 1006   K 5.0 11/03/2016 0908   CL 111 01/12/2023 1006   CO2 27 01/12/2023 1006   CO2 27 11/03/2016 0908   BUN 25 (H) 01/12/2023 1006   BUN 31 (H) 07/08/2021 1029   BUN 33.5 (H) 11/03/2016 0908   CREATININE 1.48 (H) 01/12/2023 1006   CREATININE 1.5 (H) 11/03/2016 0908      Component Value Date/Time   CALCIUM 9.4 01/12/2023 1006   CALCIUM 9.5 11/03/2016 0908   ALKPHOS 27 (L) 01/12/2023 1006   ALKPHOS 25 (L) 11/03/2016 0908   AST 26 01/12/2023 1006   AST 29 11/03/2016 0908   ALT 25 01/12/2023 1006   ALT 29 11/03/2016 0908   BILITOT 0.6 01/12/2023 1006   BILITOT 0.70 11/03/2016 0908

## 2023-01-12 NOTE — Assessment & Plan Note (Signed)
He is on multiple medications and under care of cardiologist Monitor closely He does not need dose adjustment 

## 2023-01-12 NOTE — Patient Instructions (Signed)
[facility]  Discharge Instructions: Thank you for choosing [facility] to provide your oncology and hematology care.   If you have a lab appointment with the Cancer Center, please go directly to the Cancer Center and check in at the registration area.   Wear comfortable clothing and clothing appropriate for easy access to any Portacath or PICC line.   We strive to give you quality time with your provider. You may need to reschedule your appointment if you arrive late (15 or more minutes).  Arriving late affects you and other patients whose appointments are after yours.  Also, if you miss three or more appointments without notifying the office, you may be dismissed from the clinic at the provider's discretion.      For prescription refill requests, have your pharmacy contact our office and allow 72 hours for refills to be completed.    Today you received the following chemotherapy and/or immunotherapy agents: Liposomal Doxorubicin.       To help prevent nausea and vomiting after your treatment, we encourage you to take your nausea medication as directed.  BELOW ARE SYMPTOMS THAT SHOULD BE REPORTED IMMEDIATELY: *FEVER GREATER THAN 100.4 F (38 C) OR HIGHER *CHILLS OR SWEATING *NAUSEA AND VOMITING THAT IS NOT CONTROLLED WITH YOUR NAUSEA MEDICATION *UNUSUAL SHORTNESS OF BREATH *UNUSUAL BRUISING OR BLEEDING *URINARY PROBLEMS (pain or burning when urinating, or frequent urination) *BOWEL PROBLEMS (unusual diarrhea, constipation, pain near the anus) TENDERNESS IN MOUTH AND THROAT WITH OR WITHOUT PRESENCE OF ULCERS (sore throat, sores in mouth, or a toothache) UNUSUAL RASH, SWELLING OR PAIN  UNUSUAL VAGINAL DISCHARGE OR ITCHING   Items with * indicate a potential emergency and should be followed up as soon as possible or go to the Emergency Department if any problems should occur.  Please show the CHEMOTHERAPY ALERT CARD or IMMUNOTHERAPY ALERT  CARD at check-in to the Emergency Department and triage nurse.  Should you have questions after your visit or need to cancel or reschedule your appointment, please contact [facility]  Dept: [phone]  and follow the prompts.  Office hours are 8:00 a.m. to 4:30 p.m. Monday - Friday. Please note that voicemails left after 4:00 p.m. may not be returned until the following business day.  We are closed weekends and major holidays. You have access to a nurse at all times for urgent questions. Please call the main number to the clinic Dept: [phone] and follow the prompts.   For any non-urgent questions, you may also contact your provider using MyChart. We now offer e-Visits for anyone 18 and older to request care online for non-urgent symptoms. For details visit [email or web address].   Also download the MyChart app! Go to the app store, search "MyChart", open the app, select [facility], and log in with your MyChart username and password.   

## 2023-01-13 ENCOUNTER — Other Ambulatory Visit: Payer: Self-pay

## 2023-01-16 ENCOUNTER — Ambulatory Visit: Payer: BC Managed Care – PPO

## 2023-01-16 DIAGNOSIS — Z7901 Long term (current) use of anticoagulants: Secondary | ICD-10-CM | POA: Diagnosis not present

## 2023-01-16 LAB — POCT INR: INR: 2.5 (ref 2.0–3.0)

## 2023-01-16 NOTE — Patient Instructions (Addendum)
Pre visit review using our clinic review tool, if applicable. No additional management support is needed unless otherwise documented below in the visit note.  Continue to take 1 tablet daily except take 1 1/2 on Wednesdays. Recheck in 6 weeks. 

## 2023-01-16 NOTE — Progress Notes (Signed)
Continue to take 1 tablet daily except take 1 1/2 on Wednesdays. Recheck in 6 weeks. 

## 2023-01-20 ENCOUNTER — Other Ambulatory Visit: Payer: Self-pay

## 2023-02-02 ENCOUNTER — Other Ambulatory Visit: Payer: Self-pay

## 2023-02-19 ENCOUNTER — Other Ambulatory Visit: Payer: Self-pay

## 2023-02-19 MED ORDER — ONETOUCH VERIO VI STRP: ORAL_STRIP | 3 refills | Status: AC

## 2023-02-27 ENCOUNTER — Ambulatory Visit: Payer: BC Managed Care – PPO

## 2023-02-27 DIAGNOSIS — Z7901 Long term (current) use of anticoagulants: Secondary | ICD-10-CM | POA: Diagnosis not present

## 2023-02-27 LAB — POCT INR: INR: 2.4 (ref 2.0–3.0)

## 2023-02-27 NOTE — Patient Instructions (Addendum)
Pre visit review using our clinic review tool, if applicable. No additional management support is needed unless otherwise documented below in the visit note.  Continue to take 1 tablet daily except take 1 1/2 on Wednesdays. Recheck in 6 weeks. 

## 2023-02-27 NOTE — Progress Notes (Signed)
Continue to take 1 tablet daily except take 1 1/2 on Wednesdays. Recheck in 6 weeks. 

## 2023-03-05 ENCOUNTER — Encounter: Payer: Self-pay | Admitting: Hematology and Oncology

## 2023-03-10 ENCOUNTER — Other Ambulatory Visit: Payer: Self-pay

## 2023-03-15 ENCOUNTER — Encounter (INDEPENDENT_AMBULATORY_CARE_PROVIDER_SITE_OTHER): Payer: Self-pay

## 2023-03-28 ENCOUNTER — Encounter: Payer: Self-pay | Admitting: Hematology and Oncology

## 2023-04-04 ENCOUNTER — Ambulatory Visit (INDEPENDENT_AMBULATORY_CARE_PROVIDER_SITE_OTHER): Payer: Medicare Other | Admitting: Internal Medicine

## 2023-04-04 ENCOUNTER — Encounter: Payer: Self-pay | Admitting: Internal Medicine

## 2023-04-04 VITALS — BP 130/76 | HR 58 | Temp 98.6°F | Ht 68.0 in | Wt 212.0 lb

## 2023-04-04 DIAGNOSIS — E611 Iron deficiency: Secondary | ICD-10-CM

## 2023-04-04 DIAGNOSIS — Z0001 Encounter for general adult medical examination with abnormal findings: Secondary | ICD-10-CM

## 2023-04-04 DIAGNOSIS — E7800 Pure hypercholesterolemia, unspecified: Secondary | ICD-10-CM

## 2023-04-04 DIAGNOSIS — E559 Vitamin D deficiency, unspecified: Secondary | ICD-10-CM | POA: Diagnosis not present

## 2023-04-04 DIAGNOSIS — Z125 Encounter for screening for malignant neoplasm of prostate: Secondary | ICD-10-CM | POA: Diagnosis not present

## 2023-04-04 DIAGNOSIS — Z23 Encounter for immunization: Secondary | ICD-10-CM

## 2023-04-04 DIAGNOSIS — I10 Essential (primary) hypertension: Secondary | ICD-10-CM

## 2023-04-04 DIAGNOSIS — N1831 Chronic kidney disease, stage 3a: Secondary | ICD-10-CM

## 2023-04-04 DIAGNOSIS — Z7901 Long term (current) use of anticoagulants: Secondary | ICD-10-CM

## 2023-04-04 DIAGNOSIS — E1165 Type 2 diabetes mellitus with hyperglycemia: Secondary | ICD-10-CM | POA: Diagnosis not present

## 2023-04-04 DIAGNOSIS — E538 Deficiency of other specified B group vitamins: Secondary | ICD-10-CM

## 2023-04-04 DIAGNOSIS — Z0000 Encounter for general adult medical examination without abnormal findings: Secondary | ICD-10-CM

## 2023-04-04 DIAGNOSIS — Z7984 Long term (current) use of oral hypoglycemic drugs: Secondary | ICD-10-CM

## 2023-04-04 LAB — BASIC METABOLIC PANEL
BUN: 28 mg/dL — ABNORMAL HIGH (ref 6–23)
CO2: 32 meq/L (ref 19–32)
Calcium: 9.5 mg/dL (ref 8.4–10.5)
Chloride: 104 meq/L (ref 96–112)
Creatinine, Ser: 1.87 mg/dL — ABNORMAL HIGH (ref 0.40–1.50)
GFR: 36.64 mL/min — ABNORMAL LOW (ref >60.00)
Glucose, Bld: 151 mg/dL — ABNORMAL HIGH (ref 70–99)
Potassium: 4.7 meq/L (ref 3.5–5.1)
Sodium: 141 meq/L (ref 135–145)

## 2023-04-04 LAB — VITAMIN B12: Vitamin B-12: 788 pg/mL (ref 211–911)

## 2023-04-04 LAB — URINALYSIS, ROUTINE W REFLEX MICROSCOPIC
Bilirubin Urine: NEGATIVE
Hgb urine dipstick: NEGATIVE
Ketones, ur: NEGATIVE
Leukocytes,Ua: NEGATIVE
Nitrite: NEGATIVE
RBC / HPF: NONE SEEN (ref 0–?)
Specific Gravity, Urine: 1.015 (ref 1.000–1.030)
Urine Glucose: >=1000 — AB
Urobilinogen, UA: 0.2 (ref 0.0–1.0)
pH: 7.5 (ref 5.0–8.0)

## 2023-04-04 LAB — CBC WITH DIFFERENTIAL/PLATELET
Basophils Absolute: 0 10*3/uL (ref 0.0–0.1)
Basophils Relative: 0.8 % (ref 0.0–3.0)
Eosinophils Absolute: 0.1 10*3/uL (ref 0.0–0.7)
Eosinophils Relative: 2.9 % (ref 0.0–5.0)
HCT: 49.4 % (ref 39.0–52.0)
Hemoglobin: 16.1 g/dL (ref 13.0–17.0)
Lymphocytes Relative: 23.9 % (ref 12.0–46.0)
Lymphs Abs: 1.2 10*3/uL (ref 0.7–4.0)
MCHC: 32.6 g/dL (ref 30.0–36.0)
MCV: 86.4 fl (ref 78.0–100.0)
Monocytes Absolute: 0.5 10*3/uL (ref 0.1–1.0)
Monocytes Relative: 10 % (ref 3.0–12.0)
Neutro Abs: 3 10*3/uL (ref 1.4–7.7)
Neutrophils Relative %: 62.4 % (ref 43.0–77.0)
Platelets: 158 10*3/uL (ref 150.0–400.0)
RBC: 5.72 Mil/uL (ref 4.22–5.81)
RDW: 14.2 % (ref 11.5–15.5)
WBC: 4.8 10*3/uL (ref 4.0–10.5)

## 2023-04-04 LAB — LIPID PANEL
Cholesterol: 187 mg/dL (ref 0–200)
HDL: 44.7 mg/dL (ref >39.00)
LDL Cholesterol: 107 mg/dL — ABNORMAL HIGH (ref 0–99)
NonHDL: 142.76
Total CHOL/HDL Ratio: 4
Triglycerides: 177 mg/dL — ABNORMAL HIGH (ref 0.0–149.0)
VLDL: 35.4 mg/dL (ref 0.0–40.0)

## 2023-04-04 LAB — IBC PANEL
Iron: 139 ug/dL (ref 42–165)
Saturation Ratios: 34.4 % (ref 20.0–50.0)
TIBC: 404.6 ug/dL (ref 250.0–450.0)
Transferrin: 289 mg/dL (ref 212.0–360.0)

## 2023-04-04 LAB — MICROALBUMIN / CREATININE URINE RATIO
Creatinine,U: 75.8 mg/dL
Microalb Creat Ratio: 19.7 mg/g (ref 0.0–30.0)
Microalb, Ur: 14.9 mg/dL — ABNORMAL HIGH (ref 0.0–1.9)

## 2023-04-04 LAB — HEPATIC FUNCTION PANEL
ALT: 20 U/L (ref 0–53)
AST: 24 U/L (ref 0–37)
Albumin: 4.3 g/dL (ref 3.5–5.2)
Alkaline Phosphatase: 28 U/L — ABNORMAL LOW (ref 39–117)
Bilirubin, Direct: 0.2 mg/dL (ref 0.0–0.3)
Total Bilirubin: 0.7 mg/dL (ref 0.2–1.2)
Total Protein: 6.7 g/dL (ref 6.0–8.3)

## 2023-04-04 LAB — VITAMIN D 25 HYDROXY (VIT D DEFICIENCY, FRACTURES): VITD: 71.55 ng/mL (ref 30.00–100.00)

## 2023-04-04 LAB — FERRITIN: Ferritin: 631 ng/mL — ABNORMAL HIGH (ref 22.0–322.0)

## 2023-04-04 LAB — PSA: PSA: 1.79 ng/mL (ref 0.10–4.00)

## 2023-04-04 LAB — TSH: TSH: 3.35 u[IU]/mL (ref 0.35–5.50)

## 2023-04-04 LAB — HEMOGLOBIN A1C: Hgb A1c MFr Bld: 7 % — ABNORMAL HIGH (ref 4.6–6.5)

## 2023-04-04 MED ORDER — HYDROCHLOROTHIAZIDE 12.5 MG PO CAPS
12.5000 mg | ORAL_CAPSULE | Freq: Every day | ORAL | 3 refills | Status: DC
Start: 2023-04-04 — End: 2023-11-12

## 2023-04-04 MED ORDER — DAPAGLIFLOZIN PROPANEDIOL 10 MG PO TABS: 10.0000 mg | ORAL_TABLET | Freq: Every day | ORAL | 3 refills | Status: DC

## 2023-04-04 MED ORDER — ROSUVASTATIN CALCIUM 40 MG PO TABS: ORAL_TABLET | ORAL | 3 refills | Status: DC

## 2023-04-04 MED ORDER — LOSARTAN POTASSIUM 25 MG PO TABS: 25.0000 mg | ORAL_TABLET | Freq: Every day | ORAL | 3 refills | Status: DC

## 2023-04-04 MED ORDER — WARFARIN SODIUM 5 MG PO TABS
ORAL_TABLET | ORAL | 3 refills | Status: DC
Start: 2023-04-04 — End: 2024-01-01

## 2023-04-04 MED ORDER — METOPROLOL SUCCINATE ER 200 MG PO TB24: ORAL_TABLET | ORAL | 3 refills | Status: DC

## 2023-04-04 MED ORDER — FENOFIBRATE 160 MG PO TABS: 160.0000 mg | ORAL_TABLET | Freq: Every day | ORAL | 3 refills | Status: DC

## 2023-04-04 NOTE — Patient Instructions (Addendum)
You had the flu shot today  Ok to increase the farxiga to 10 mg per day  Please take all new medication as prescribed - the HCT 12.5 mg per day  Please see cardiology if not improving with this mild fluid pill  Please continue all other medications as before, and refills have been done if requested.  Please have the pharmacy call with any other refills you may need.  Please continue your efforts at being more active, low cholesterol diet, and weight control.  You are otherwise up to date with prevention measures today.  Please keep your appointments with your specialists as you may have planned  Please go to the LAB at the blood drawing area for the tests to be done  You will be contacted by phone if any changes need to be made immediately.  Otherwise, you will receive a letter about your results with an explanation, but please check with MyChart first.  Please make an Appointment to return in 6 months, or sooner if needed

## 2023-04-04 NOTE — Progress Notes (Signed)
Patient ID: Austin [name], male   DOB: 1955/10/14, 67 y.o.   MRN: [id number]         Chief Complaint:: wellness exam and bilateral pedal edema, dm, htn, hld, ckd3a       HPI:  Austin [name] is a 67 y.o. male here for wellness exam; for flu shot, declines covid booster, o/w up to date                        Also Pt denies chest pain, increased sob or doe, wheezing, orthopnea, PND,  palpitations, dizziness or syncope, except for mild bilateral pedal edema for 2 mo.    Pt denies polydipsia, polyuria, or new focal neuro s/s.    Pt denies fever, wt loss, night sweats, loss of appetite, or other constitutional symptoms     Wt Readings from Last 3 Encounters:  04/04/23 212 lb (96.2 kg)  01/12/23 211 lb 6.4 oz (95.9 kg)  09/07/22 209 lb 12.8 oz (95.2 kg)   BP Readings from Last 3 Encounters:  04/04/23 130/76  01/12/23 (!) 109/54  09/07/22 134/71   Immunization History  Administered Date(s) Administered   Fluad Trivalent(High Dose 65+) 04/04/2023   Influenza Split 05/15/2011, 07/29/2012   Influenza Whole 07/19/2005, 04/30/2006, 08/12/2009   Influenza,inj,Quad PF,6+ Mos 05/13/2013, 07/01/2014, 05/21/2015, 04/21/2016, 04/11/2017, 05/02/2018, 04/22/2019   Influenza-Unspecified 05/03/2020   PFIZER Comirnaty(Gray Top)Covid-19 Tri-Sucrose Vaccine 11/02/2020   PFIZER(Purple Top)SARS-COV-2 Vaccination 10/13/2019, 11/03/2019, 05/03/2020, 04/18/2021   Pneumococcal Conjugate-13 07/03/2013   Pneumococcal Polysaccharide-23 03/07/2006, 10/11/2021   Td 03/30/2009   Tdap 09/17/2019   Zoster Recombinant(Shingrix) 05/01/2022, 07/03/2022   Health Maintenance Due  Topic Date Due   Medicare Annual Wellness (AWV)  Never done   COVID-19 Vaccine (6 - 2023-24 season) 04/01/2023      Past Medical History:  Diagnosis Date   Anemia    iron def   Aortic valve prosthesis present    a. SJM - [facility] 2003, chronic coumadin.   Back pain    CKD (chronic kidney disease), stage II    Coronary artery disease     not sure where this dx came from - pt denies   Family history of melanoma    Hearing loss    Hyperlipidemia    Hypertension    Internal hemorrhoid    Kaposi sarcoma (HCC)    Obesity    Pericardial effusion    Thoracic aortic aneurysm (HCC)    a. s/p dissection and repair @ [facility] 2003 with SJM AVR with residual findings followed by CT.   Thyroid disease    Past Surgical History:  Procedure Laterality Date   ABDOMINAL AORTIC ANEURYSM REPAIR     AORTIC VALVE REPLACEMENT     impingment     right shoulder   IR IMAGING GUIDED PORT INSERTION  09/17/2020   KNEE ARTHROSCOPY     left   WISDOM TOOTH EXTRACTION      reports that he quit smoking about 21 years ago. His smoking use included cigarettes. He started smoking about 36 years ago. He has a 15 pack-year smoking history. He has never used smokeless tobacco. He reports current alcohol use. He reports that he does not use drugs. family history includes Heart disease in his paternal aunt; Liver disease in his sister; Melanoma in his maternal uncle; Other in his maternal grandfather; Stroke in his mother; Stroke (age of onset: 21) in his father. Allergies  Allergen Reactions   Celecoxib Itching and Other (  See Comments)    Joint pain   Current Outpatient Medications on File Prior to Visit  Medication Sig Dispense Refill   ferrous sulfate 325 (65 FE) MG tablet Take 1 tablet (325 mg total) by mouth daily with breakfast. 30 tablet 3   glucose blood (ONETOUCH VERIO) test strip Use to check blood sugars daily 100 each 3   Lancets (ONETOUCH ULTRASOFT) lancets Use as instructed 100 each 12   lidocaine-prilocaine (EMLA) cream Apply 1 application topically as needed. 30 g 0   Multiple Vitamin (MULTIVITAMIN) tablet Take 1 tablet by mouth daily.     No current facility-administered medications on file prior to visit.        ROS:  All others reviewed and negative.  Objective        PE:  BP 130/76 (BP Location: Right Arm, Patient Position:  Sitting, Cuff Size: Normal)   Pulse (!) 58   Temp 98.6 F (37 C) (Oral)   Ht 5\' 8"  (1.727 m)   Wt 212 lb (96.2 kg)   SpO2 98%   BMI 32.23 kg/m                 Constitutional: Pt appears in NAD               HENT: Head: NCAT.                Right Ear: External ear normal.                 Left Ear: External ear normal.                Eyes: . Pupils are equal, round, and reactive to light. Conjunctivae and EOM are normal               Nose: without d/c or deformity               Neck: Neck supple. Gross normal ROM               Cardiovascular: Normal rate and regular rhythm.                 Pulmonary/Chest: Effort normal and breath sounds without rales or wheezing.                Abd:  Soft, NT, ND, + BS, no organomegaly               Neurological: Pt is alert. At baseline orientation, motor grossly intact               Skin: Skin is warm. No rashes, no other new lesions, LE edema - none               Psychiatric: Pt behavior is normal without agitation   Micro: none  Cardiac tracings I have personally interpreted today:  none  Pertinent Radiological findings (summarize): none   Lab Results  Component Value Date   WBC 4.8 04/04/2023   HGB 16.1 04/04/2023   HCT 49.4 04/04/2023   PLT 158.0 04/04/2023   GLUCOSE 151 (H) 04/04/2023   CHOL 187 04/04/2023   TRIG 177.0 (H) 04/04/2023   HDL 44.70 04/04/2023   LDLDIRECT 107.0 10/11/2021   LDLCALC 107 (H) 04/04/2023   ALT 20 04/04/2023   AST 24 04/04/2023   NA 141 04/04/2023   K 4.7 04/04/2023   CL 104 04/04/2023   CREATININE 1.87 (H) 04/04/2023   BUN 28 (H) 04/04/2023   CO2 32  04/04/2023   TSH 3.35 04/04/2023   PSA 1.79 04/04/2023   INR 2.4 02/27/2023   HGBA1C 7.0 (H) 04/04/2023   MICROALBUR 14.9 (H) 04/04/2023   Assessment/Plan:  Austin [name] is a 67 y.o. Black or African American [2] male with  has a past medical history of Anemia, Aortic valve prosthesis present, Back pain, CKD (chronic kidney disease), stage II,  Coronary artery disease, Family history of melanoma, Hearing loss, Hyperlipidemia, Hypertension, Internal hemorrhoid, Kaposi sarcoma (HCC), Obesity, Pericardial effusion, Thoracic aortic aneurysm (HCC), and Thyroid disease.  Encounter for well adult exam with abnormal findings Age and sex appropriate education and counseling updated with regular exercise and diet Referrals for preventative services - none needed Immunizations addressed - for flu shot, and declines covid booster Smoking counseling  - none needed Evidence for depression or other mood disorder - none significant Most recent labs reviewed. I have personally reviewed and have noted: 1) the patient's medical and social history 2) The patient's current medications and supplements 3) The patient's height, weight, and BMI have been recorded in the chart   CKD (chronic kidney disease), stage III (HCC) Lab Results  Component Value Date   CREATININE 1.87 (H) 04/04/2023   Stable overall, cont to avoid nephrotoxins   Diabetes (HCC) Lab Results  Component Value Date   HGBA1C 7.0 (H) 04/04/2023   Uncontrolled, for increased farxiga 10 qd   Essential hypertension BP Readings from Last 3 Encounters:  04/04/23 130/76  01/12/23 (!) 109/54  09/07/22 134/71   Stable, pt to continue medical treatment losartan 25 every day, toprol xl 200 every day, and add hct 12.5 with mild pedal edema   HLD (hyperlipidemia) Lab Results  Component Value Date   LDLCALC 107 (H) 04/04/2023   Uncontrolled, goal ldl < 70,, pt to add repatha 140 mg, and continue fenofibrate 160 every day, crestor 40 every day and lower chol diet,   Iron deficiency For fu lab, consider d/c iron po Followup: No follow-ups on file.  Oliver Barre, MD 04/06/2023 8:54 PM [facility]

## 2023-04-05 ENCOUNTER — Other Ambulatory Visit: Payer: Self-pay | Admitting: Internal Medicine

## 2023-04-05 MED ORDER — REPATHA SURECLICK 140 MG/ML ~~LOC~~ SOAJ: 140.0000 mg | SUBCUTANEOUS | 3 refills | Status: DC

## 2023-04-06 DIAGNOSIS — E611 Iron deficiency: Secondary | ICD-10-CM | POA: Insufficient documentation

## 2023-04-06 NOTE — Assessment & Plan Note (Signed)
Age and sex appropriate education and counseling updated with regular exercise and diet Referrals for preventative services - none needed Immunizations addressed - for flu shot, and declines covid booster Smoking counseling  - none needed Evidence for depression or other mood disorder - none significant Most recent labs reviewed. I have personally reviewed and have noted: 1) the patient's medical and social history 2) The patient's current medications and supplements 3) The patient's height, weight, and BMI have been recorded in the chart

## 2023-04-06 NOTE — Assessment & Plan Note (Signed)
For fu lab, consider d/c iron po

## 2023-04-06 NOTE — Assessment & Plan Note (Signed)
BP Readings from Last 3 Encounters:  04/04/23 130/76  01/12/23 (!) 109/54  09/07/22 134/71   Stable, pt to continue medical treatment losartan 25 every day, toprol xl 200 every day, and add hct 12.5 with mild pedal edema

## 2023-04-06 NOTE — Assessment & Plan Note (Addendum)
Lab Results  Component Value Date   LDLCALC 107 (H) 04/04/2023   Uncontrolled, goal ldl < 70,, pt to add repatha 140 mg, and continue fenofibrate 160 every day, crestor 40 every day and lower chol diet,

## 2023-04-06 NOTE — Assessment & Plan Note (Signed)
Lab Results  Component Value Date   CREATININE 1.87 (H) 04/04/2023   Stable overall, cont to avoid nephrotoxins

## 2023-04-06 NOTE — Assessment & Plan Note (Signed)
Lab Results  Component Value Date   HGBA1C 7.0 (H) 04/04/2023   Uncontrolled, for increased farxiga 10 qd

## 2023-04-10 ENCOUNTER — Telehealth: Payer: Self-pay

## 2023-04-10 ENCOUNTER — Ambulatory Visit: Payer: Medicare Other

## 2023-04-10 DIAGNOSIS — Z7901 Long term (current) use of anticoagulants: Secondary | ICD-10-CM | POA: Diagnosis not present

## 2023-04-10 LAB — POCT INR: INR: 2.6 (ref 2.0–3.0)

## 2023-04-10 NOTE — Patient Instructions (Addendum)
Pre visit review using our clinic review tool, if applicable. No additional management support is needed unless otherwise documented below in the visit note.  Continue to take 1 tablet daily except take 1 1/2 on Wednesdays. Recheck in 6 weeks. 

## 2023-04-10 NOTE — Progress Notes (Signed)
Continue to take 1 tablet daily except take 1 1/2 on Wednesdays. Recheck in 6 weeks. 

## 2023-04-10 NOTE — Telephone Encounter (Signed)
Ok sure, thanks

## 2023-04-10 NOTE — Telephone Encounter (Signed)
Pt in coumadin clinic today for INR check and inquired if he could test at home. Advised an order can be sent to [facility] and they will work on English as a second language teacher and then contact him with cost of home testing. Advised the coumadin clinic would still manage dosing. Pt would like to try to get a home INR machine. Advised an order would be signed and faxed to [employer] and for him to watch for a call from them. Pt verbalized understanding.   Order complete pending PCP signature.

## 2023-04-11 NOTE — Telephone Encounter (Signed)
Faxed order for home INR machine with demographic sheet and insurance cards.

## 2023-04-16 ENCOUNTER — Telehealth: Payer: Self-pay | Admitting: Internal Medicine

## 2023-04-16 MED ORDER — PRALUENT 150 MG/ML ~~LOC~~ SOAJ: 150.0000 mg | SUBCUTANEOUS | 2 refills | Status: DC

## 2023-04-16 NOTE — Telephone Encounter (Signed)
Evolocumab (REPATHA SURECLICK) 140 MG/ML SOAJ [[id number]]   This medication was rejected with his insurance from my understanding and we want to know if there is a generic brand.Marland Kitchen

## 2023-04-16 NOTE — Telephone Encounter (Signed)
No, there is no generic, but we can try to change to praluent - done erx - to see if covered better with the insurance

## 2023-04-17 NOTE — Telephone Encounter (Signed)
Called and let Pt know

## 2023-04-18 ENCOUNTER — Ambulatory Visit (INDEPENDENT_AMBULATORY_CARE_PROVIDER_SITE_OTHER): Payer: Medicare Other

## 2023-04-18 DIAGNOSIS — Z7901 Long term (current) use of anticoagulants: Secondary | ICD-10-CM

## 2023-04-18 LAB — POCT INR: INR: 2.9 (ref 2.0–3.0)

## 2023-04-18 NOTE — Patient Instructions (Addendum)
Pre visit review using our clinic review tool, if applicable. No additional management support is needed unless otherwise documented below in the visit note.  Continue to take 1 tablet daily except take 1 1/2 on Wednesdays. Recheck in 2 weeks.

## 2023-04-18 NOTE — Telephone Encounter (Signed)
Pt reported INR result today on [employer] website. He is now testing INR at home.  Will contact pt with dosing instructions and document in anticoagulation encounter.

## 2023-04-18 NOTE — Progress Notes (Signed)
Pt was set up today with home INR testing by [employer]. Pt submitted his first INR result on the [employer] website today. INR today is 2.9  Continue to take 1 tablet daily except take 1 1/2 on Wednesdays. Recheck in 2 weeks.  Contacted pt and advised of dosing and retest date. Pt verbalized understanding.

## 2023-05-02 ENCOUNTER — Ambulatory Visit (INDEPENDENT_AMBULATORY_CARE_PROVIDER_SITE_OTHER): Payer: Medicare Other

## 2023-05-02 DIAGNOSIS — Z7901 Long term (current) use of anticoagulants: Secondary | ICD-10-CM

## 2023-05-02 LAB — POCT INR: INR: 2.4 (ref 2.0–3.0)

## 2023-05-02 NOTE — Patient Instructions (Addendum)
Pre visit review using our clinic review tool, if applicable. No additional management support is needed unless otherwise documented below in the visit note.  Continue to take 1 tablet daily except take 1 1/2 on Wednesdays. Recheck in 4 weeks.

## 2023-05-02 NOTE — Progress Notes (Signed)
Pt tests at home and submits INR result on the [employer] website today. INR today is 2.4  Continue to take 1 tablet daily except take 1 1/2 on Wednesdays. Recheck in 4 weeks. Contacted pt and advised of dosing and retest date. Pt verbalized understanding.

## 2023-05-04 ENCOUNTER — Encounter: Payer: Self-pay | Admitting: Hematology and Oncology

## 2023-05-10 MED FILL — Dexamethasone Sodium Phosphate Inj 100 MG/10ML: INTRAMUSCULAR | Qty: 1 | Status: AC

## 2023-05-11 ENCOUNTER — Inpatient Hospital Stay: Payer: Medicare Other

## 2023-05-11 ENCOUNTER — Encounter: Payer: Self-pay | Admitting: Hematology and Oncology

## 2023-05-11 ENCOUNTER — Inpatient Hospital Stay: Payer: Medicare Other | Admitting: Hematology and Oncology

## 2023-05-11 ENCOUNTER — Inpatient Hospital Stay: Payer: Medicare Other | Attending: Hematology and Oncology

## 2023-05-11 VITALS — BP 127/81 | HR 55 | Temp 98.2°F | Resp 18 | Ht 68.0 in | Wt 213.4 lb

## 2023-05-11 DIAGNOSIS — C469 Kaposi's sarcoma, unspecified: Secondary | ICD-10-CM

## 2023-05-11 DIAGNOSIS — Z5111 Encounter for antineoplastic chemotherapy: Secondary | ICD-10-CM | POA: Diagnosis present

## 2023-05-11 DIAGNOSIS — N183 Chronic kidney disease, stage 3 unspecified: Secondary | ICD-10-CM | POA: Diagnosis not present

## 2023-05-11 DIAGNOSIS — Z95828 Presence of other vascular implants and grafts: Secondary | ICD-10-CM

## 2023-05-11 DIAGNOSIS — N1831 Chronic kidney disease, stage 3a: Secondary | ICD-10-CM | POA: Diagnosis not present

## 2023-05-11 DIAGNOSIS — R972 Elevated prostate specific antigen [PSA]: Secondary | ICD-10-CM

## 2023-05-11 LAB — CBC WITH DIFFERENTIAL (CANCER CENTER ONLY)
Abs Immature Granulocytes: 0.01 10*3/uL (ref 0.00–0.07)
Basophils Absolute: 0 10*3/uL (ref 0.0–0.1)
Basophils Relative: 0 %
Eosinophils Absolute: 0.1 10*3/uL (ref 0.0–0.5)
Eosinophils Relative: 2 %
HCT: 45.3 % (ref 39.0–52.0)
Hemoglobin: 15.7 g/dL (ref 13.0–17.0)
Immature Granulocytes: 0 %
Lymphocytes Relative: 20 %
Lymphs Abs: 1 10*3/uL (ref 0.7–4.0)
MCH: 29.2 pg (ref 26.0–34.0)
MCHC: 34.7 g/dL (ref 30.0–36.0)
MCV: 84.4 fL (ref 80.0–100.0)
Monocytes Absolute: 0.4 10*3/uL (ref 0.1–1.0)
Monocytes Relative: 8 %
Neutro Abs: 3.2 10*3/uL (ref 1.7–7.7)
Neutrophils Relative %: 70 %
Platelet Count: 151 10*3/uL (ref 150–400)
RBC: 5.37 MIL/uL (ref 4.22–5.81)
RDW: 13.2 % (ref 11.5–15.5)
WBC Count: 4.7 10*3/uL (ref 4.0–10.5)
nRBC: 0 % (ref 0.0–0.2)

## 2023-05-11 LAB — CMP (CANCER CENTER ONLY)
ALT: 21 U/L (ref 0–44)
AST: 28 U/L (ref 15–41)
Albumin: 4.2 g/dL (ref 3.5–5.0)
Alkaline Phosphatase: 24 U/L — ABNORMAL LOW (ref 38–126)
Anion gap: 4 — ABNORMAL LOW (ref 5–15)
BUN: 28 mg/dL — ABNORMAL HIGH (ref 8–23)
CO2: 26 mmol/L (ref 22–32)
Calcium: 9 mg/dL (ref 8.9–10.3)
Chloride: 108 mmol/L (ref 98–111)
Creatinine: 1.57 mg/dL — ABNORMAL HIGH (ref 0.61–1.24)
GFR, Estimated: 48 mL/min — ABNORMAL LOW (ref >60)
Glucose, Bld: 142 mg/dL — ABNORMAL HIGH (ref 70–99)
Potassium: 4.1 mmol/L (ref 3.5–5.1)
Sodium: 138 mmol/L (ref 135–145)
Total Bilirubin: 0.7 mg/dL (ref 0.3–1.2)
Total Protein: 6.6 g/dL (ref 6.5–8.1)

## 2023-05-11 MED ORDER — SODIUM CHLORIDE 0.9 % IV SOLN
10.0000 mg | Freq: Once | INTRAVENOUS | Status: AC
  Administered 2023-05-11: 10 mg via INTRAVENOUS
  Filled 2023-05-11: qty 10

## 2023-05-11 MED ORDER — SODIUM CHLORIDE 0.9% FLUSH
10.0000 mL | Freq: Once | INTRAVENOUS | Status: AC
  Administered 2023-05-11: 10 mL

## 2023-05-11 MED ORDER — SODIUM CHLORIDE 0.9% FLUSH
10.0000 mL | INTRAVENOUS | Status: DC | PRN
  Administered 2023-05-11: 10 mL

## 2023-05-11 MED ORDER — HEPARIN SOD (PORK) LOCK FLUSH 100 UNIT/ML IV SOLN
500.0000 [IU] | Freq: Once | INTRAVENOUS | Status: AC | PRN
  Administered 2023-05-11: 500 [IU]

## 2023-05-11 MED ORDER — DEXTROSE 5 % IV SOLN: Freq: Once | INTRAVENOUS | Status: AC

## 2023-05-11 MED ORDER — DOXORUBICIN HCL LIPOSOMAL CHEMO INJECTION 2 MG/ML
27.0000 mg/m2 | Freq: Once | INTRAVENOUS | Status: AC
  Administered 2023-05-11: 60 mg via INTRAVENOUS
  Filled 2023-05-11: qty 30

## 2023-05-11 NOTE — Progress Notes (Signed)
OK to treat D1/C4 Doxil with Creatinine 1.57 per Dr. Bertis Ruddy.

## 2023-05-11 NOTE — Assessment & Plan Note (Signed)
He is on multiple medications and under care of cardiologist Monitor closely He does not need dose adjustment

## 2023-05-11 NOTE — Assessment & Plan Note (Signed)
I have reviewed recommendation from her prior physicians and specialist for treatment of recurrent sarcoma For now, we will continue liposomal doxorubicin every 4 months Clinically, he does not have any evidence of relapse on exam There is no indicated for imaging He had repeat echocardiogram done recently that showed preserved ejection fraction We will monitor echocardiogram every 1 to 2 years We will proceed with treatment

## 2023-05-11 NOTE — Patient Instructions (Signed)
[facility]  Discharge Instructions: Thank you for choosing [facility] to provide your oncology and hematology care.   If you have a lab appointment with the Cancer Center, please go directly to the Cancer Center and check in at the registration area.   Wear comfortable clothing and clothing appropriate for easy access to any Portacath or PICC line.   We strive to give you quality time with your provider. You may need to reschedule your appointment if you arrive late (15 or more minutes).  Arriving late affects you and other patients whose appointments are after yours.  Also, if you miss three or more appointments without notifying the office, you may be dismissed from the clinic at the provider's discretion.      For prescription refill requests, have your pharmacy contact our office and allow 72 hours for refills to be completed.    Today you received the following chemotherapy and/or immunotherapy agents: Liposomal Doxorubicin.       To help prevent nausea and vomiting after your treatment, we encourage you to take your nausea medication as directed.  BELOW ARE SYMPTOMS THAT SHOULD BE REPORTED IMMEDIATELY: *FEVER GREATER THAN 100.4 F (38 C) OR HIGHER *CHILLS OR SWEATING *NAUSEA AND VOMITING THAT IS NOT CONTROLLED WITH YOUR NAUSEA MEDICATION *UNUSUAL SHORTNESS OF BREATH *UNUSUAL BRUISING OR BLEEDING *URINARY PROBLEMS (pain or burning when urinating, or frequent urination) *BOWEL PROBLEMS (unusual diarrhea, constipation, pain near the anus) TENDERNESS IN MOUTH AND THROAT WITH OR WITHOUT PRESENCE OF ULCERS (sore throat, sores in mouth, or a toothache) UNUSUAL RASH, SWELLING OR PAIN  UNUSUAL VAGINAL DISCHARGE OR ITCHING   Items with * indicate a potential emergency and should be followed up as soon as possible or go to the Emergency Department if any problems should occur.  Please show the CHEMOTHERAPY ALERT CARD or IMMUNOTHERAPY ALERT  CARD at check-in to the Emergency Department and triage nurse.  Should you have questions after your visit or need to cancel or reschedule your appointment, please contact [facility]  Dept: [phone]  and follow the prompts.  Office hours are 8:00 a.m. to 4:30 p.m. Monday - Friday. Please note that voicemails left after 4:00 p.m. may not be returned until the following business day.  We are closed weekends and major holidays. You have access to a nurse at all times for urgent questions. Please call the main number to the clinic Dept: [phone] and follow the prompts.   For any non-urgent questions, you may also contact your provider using MyChart. We now offer e-Visits for anyone 18 and older to request care online for non-urgent symptoms. For details visit [email or web address].   Also download the MyChart app! Go to the app store, search "MyChart", open the app, select [facility], and log in with your MyChart username and password.   

## 2023-05-11 NOTE — Progress Notes (Signed)
[facility] OFFICE PROGRESS NOTE  Patient Care Team: Corwin Levins, MD as PCP - Melonie Florida, Nile Dear, MD as PCP - Cardiology (Cardiology)  ASSESSMENT & PLAN:  Kaposi sarcoma [facility]) I have reviewed recommendation from her prior physicians and specialist for treatment of recurrent sarcoma For now, we will continue liposomal doxorubicin every 4 months Clinically, he does not have any evidence of relapse on exam There is no indicated for imaging He had repeat echocardiogram done recently that showed preserved ejection fraction We will monitor echocardiogram every 1 to 2 years We will proceed with treatment  CKD (chronic kidney disease), stage III (HCC) He is on multiple medications and under care of cardiologist Monitor closely He does not need dose adjustment  Orders Placed This Encounter  Procedures   CBC with Differential (Cancer Center Only)    Standing Status:   Future    Standing Expiration Date:   09/06/2024   CMP (Cancer Center only)    Standing Status:   Future    Standing Expiration Date:   09/06/2024    All questions were answered. The patient knows to call the clinic with any problems, questions or concerns. The total time spent in the appointment was 20 minutes encounter with patients including review of chart and various tests results, discussions about plan of care and coordination of care plan   Artis Delay, MD 05/11/2023 1:07 PM  INTERVAL HISTORY: Please see below for problem oriented charting. he returns for treatment follow-up on maintenance liposomal doxorubicin for recurrent Kaposi's sarcoma He denies new skin changes since our last visit No side effects from treatment so far  REVIEW OF SYSTEMS:   Constitutional: Denies fevers, chills or abnormal weight loss Eyes: Denies blurriness of vision Ears, nose, mouth, throat, and face: Denies mucositis or sore throat Respiratory: Denies cough, dyspnea or wheezes Cardiovascular: Denies  palpitation, chest discomfort or lower extremity swelling Gastrointestinal:  Denies nausea, heartburn or change in bowel habits Skin: Denies abnormal skin rashes Lymphatics: Denies new lymphadenopathy or easy bruising Neurological:Denies numbness, tingling or new weaknesses Behavioral/Psych: Mood is stable, no new changes  All other systems were reviewed with the patient and are negative.  I have reviewed the past medical history, past surgical history, social history and family history with the patient and they are unchanged from previous note.  ALLERGIES:  is allergic to celecoxib.  MEDICATIONS:  Current Outpatient Medications  Medication Sig Dispense Refill   Alirocumab (PRALUENT) 150 MG/ML SOAJ Inject 1 mL (150 mg total) into the skin every 14 (fourteen) days. 2 mL 2   dapagliflozin propanediol (FARXIGA) 10 MG TABS tablet Take 1 tablet (10 mg total) by mouth daily before breakfast. 90 tablet 3   fenofibrate 160 MG tablet Take 1 tablet (160 mg total) by mouth daily. 90 tablet 3   ferrous sulfate 325 (65 FE) MG tablet Take 1 tablet (325 mg total) by mouth daily with breakfast. 30 tablet 3   glucose blood (ONETOUCH VERIO) test strip Use to check blood sugars daily 100 each 3   hydrochlorothiazide (MICROZIDE) 12.5 MG capsule Take 1 capsule (12.5 mg total) by mouth daily. 90 capsule 3   Lancets (ONETOUCH ULTRASOFT) lancets Use as instructed 100 each 12   lidocaine-prilocaine (EMLA) cream Apply 1 application topically as needed. 30 g 0   losartan (COZAAR) 25 MG tablet Take 1 tablet (25 mg total) by mouth daily. 90 tablet 3   metoprolol (TOPROL-XL) 200 MG 24 hr tablet TAKE 1 TABLET DAILY  90 tablet 3   Multiple Vitamin (MULTIVITAMIN) tablet Take 1 tablet by mouth daily.     rosuvastatin (CRESTOR) 40 MG tablet TAKE 1 TABLET DAILY 90 tablet 3   warfarin (COUMADIN) 5 MG tablet TAKE ONE TABLET DAILY EXCEPT, TAKE ONE AND ONE-HALF TABLETS ON FRIDAYS OR TAKE AS DIRECTED BY ANTICOAGULATION CLINIC 100  tablet 3   No current facility-administered medications for this visit.   Facility-Administered Medications Ordered in Other Visits  Medication Dose Route Frequency Provider Last Rate Last Admin   heparin lock flush 100 unit/mL  500 Units Intracatheter Once PRN Bertis Ruddy, Rumor Sun, MD       sodium chloride flush (NS) 0.9 % injection 10 mL  10 mL Intracatheter PRN Artis Delay, MD        SUMMARY OF ONCOLOGIC HISTORY: Oncology History  Kaposi sarcoma (HCC)  11/09/2014 Pathology Results   A.  Outside case, [phone], [facility] [place]cy Panning, [place].  Date of procedure 11-09-14:   Skin, right lower leg, biopsy:     Kaposi sarcoma. See comment.   Comment:  Immunohistochemistry performed at the outside institution was reviewed.  The tumor cells are positive for HHV8, consistent with Kaposi sarcoma.   02/03/2015 Initial Diagnosis   Kaposi sarcoma (HCC)   04/04/2018 - 02/08/2022 Chemotherapy   Patient is on Treatment Plan : KAPOSIS SARCOMA Liposomal Doxorubicin q28d     05/09/2022 -  Chemotherapy   Patient is on Treatment Plan : KAPOSIS SARCOMA Liposomal Doxorubicin (20) q 4 MONTHS     08/31/2022 Cancer Staging   Staging form: Soft Tissue Sarcoma, AJCC 7th Edition - Clinical stage from 08/31/2022: Stage Unknown (rTX, N0, M0) - Signed by Artis Delay, MD on 08/31/2022 Stage prefix: Recurrence Biopsy of metastatic site performed: Yes   10/09/2022 Echocardiogram   1. Moderate septal hypertrophy with otherwise mild concentric LVH. Left ventricular ejection fraction, by estimation, is 60 to 65%. Left ventricular ejection fraction by PLAX is 64 %. The left ventricle has  normal function. The left ventricle has no regional wall motion abnormalities. There is mild left ventricular hypertrophy of the septal segment. Left ventricular diastolic parameters are consistent with Grade II diastolic dysfunction (pseudonormalization).  Elevated left ventricular end-diastolic pressure.   2. Right ventricular  systolic function is normal. The right ventricular size is normal. There is normal pulmonary artery systolic pressure.   3. Left atrial size was moderately dilated.   4. Right atrial size was moderately dilated.   5. The mitral valve is normal in structure. Trivial mitral valve  regurgitation. No evidence of mitral stenosis.   6. Prosthetic valve mean gradient has increased from 18 mmHg in 2022 to 22 mmHg now. However, the acceleration time is 0.87 ms and DVI 0.31 consistent with a normally functioning mechanical valve. 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 The aortic valve has been repaired/replaced. Aortic valve regurgitation is trivial. Mild to moderate aortic valve stenosis. Aortic regurgitation PHT measures 533 msec. Aortic valve area, by VTI measures 1.50 cm. Aortic valve mean gradient measures 11.4 mmHg. Aortic valve Vmax measures 3.30 m/s.   7. Aortic root/ascending aorta has been repaired/replaced. There is mild dilatation of the aortic root, measuring 40 mm. There is mild dilatation of the ascending aorta, measuring 36 mm.   8. The inferior vena cava is normal in size with greater than 50% respiratory variability, suggesting right atrial pressure of 3 mmHg.      PHYSICAL EXAMINATION: ECOG PERFORMANCE STATUS: 0 - Asymptomatic  Vitals:  05/11/23 1017  BP: 127/81  Pulse: (!) 55  Resp: 18  Temp: 98.2 F (36.8 C)  SpO2: 99%   Filed Weights   05/11/23 1017  Weight: 213 lb 6.4 oz (96.8 kg)    GENERAL:alert, no distress and comfortable SKIN: Chronic skin changes on his leg unchanged compared to previous exam  LABORATORY DATA:  I have reviewed the data as listed    Component Value Date/Time   NA 138 05/11/2023 0952   NA 141 07/08/2021 1029   NA 143 11/03/2016 0908   K 4.1 05/11/2023 0952   K 5.0 11/03/2016 0908   CL 108 05/11/2023 0952   CO2 26 05/11/2023 0952   CO2 27 11/03/2016 0908   GLUCOSE 142 (H) 05/11/2023 0952   GLUCOSE 70 11/03/2016 0908   BUN 28  (H) 05/11/2023 0952   BUN 31 (H) 07/08/2021 1029   BUN 33.5 (H) 11/03/2016 0908   CREATININE 1.57 (H) 05/11/2023 0952   CREATININE 1.5 (H) 11/03/2016 0908   CALCIUM 9.0 05/11/2023 0952   CALCIUM 9.5 11/03/2016 0908   PROT 6.6 05/11/2023 0952   PROT 6.4 11/03/2016 0908   ALBUMIN 4.2 05/11/2023 0952   ALBUMIN 4.2 11/03/2016 0908   AST 28 05/11/2023 0952   AST 29 11/03/2016 0908   ALT 21 05/11/2023 0952   ALT 29 11/03/2016 0908   ALKPHOS 24 (L) 05/11/2023 0952   ALKPHOS 25 (L) 11/03/2016 0908   BILITOT 0.7 05/11/2023 0952   BILITOT 0.70 11/03/2016 0908   GFRNONAA 48 (L) 05/11/2023 0952   GFRAA 53 (L) 05/26/2019 1020   GFRAA 58 (L) 02/20/2019 0922    No results found for: "SPEP", "UPEP"  Lab Results  Component Value Date   WBC 4.7 05/11/2023   NEUTROABS 3.2 05/11/2023   HGB 15.7 05/11/2023   HCT 45.3 05/11/2023   MCV 84.4 05/11/2023   PLT 151 05/11/2023      Chemistry      Component Value Date/Time   NA 138 05/11/2023 0952   NA 141 07/08/2021 1029   NA 143 11/03/2016 0908   K 4.1 05/11/2023 0952   K 5.0 11/03/2016 0908   CL 108 05/11/2023 0952   CO2 26 05/11/2023 0952   CO2 27 11/03/2016 0908   BUN 28 (H) 05/11/2023 0952   BUN 31 (H) 07/08/2021 1029   BUN 33.5 (H) 11/03/2016 0908   CREATININE 1.57 (H) 05/11/2023 0952   CREATININE 1.5 (H) 11/03/2016 0908      Component Value Date/Time   CALCIUM 9.0 05/11/2023 0952   CALCIUM 9.5 11/03/2016 0908   ALKPHOS 24 (L) 05/11/2023 0952   ALKPHOS 25 (L) 11/03/2016 0908   AST 28 05/11/2023 0952   AST 29 11/03/2016 0908   ALT 21 05/11/2023 0952   ALT 29 11/03/2016 0908   BILITOT 0.7 05/11/2023 0952   BILITOT 0.70 11/03/2016 0908

## 2023-05-12 ENCOUNTER — Other Ambulatory Visit: Payer: Self-pay

## 2023-05-15 ENCOUNTER — Other Ambulatory Visit: Payer: Self-pay

## 2023-05-22 ENCOUNTER — Ambulatory Visit: Payer: Medicare Other

## 2023-05-22 ENCOUNTER — Encounter: Payer: Self-pay | Admitting: Hematology and Oncology

## 2023-05-22 ENCOUNTER — Telehealth: Payer: Self-pay | Admitting: Internal Medicine

## 2023-05-22 ENCOUNTER — Other Ambulatory Visit (HOSPITAL_COMMUNITY): Payer: Self-pay

## 2023-05-22 ENCOUNTER — Telehealth: Payer: Self-pay

## 2023-05-22 MED ORDER — REPATHA SURECLICK 140 MG/ML ~~LOC~~ SOAJ: 140.0000 mg | SUBCUTANEOUS | 3 refills | Status: DC

## 2023-05-22 NOTE — Telephone Encounter (Signed)
Ok this is done thanks

## 2023-05-22 NOTE — Telephone Encounter (Signed)
Patient spoke with their insurance and they said they will now cover Evolocumab (REPATHA SURECLICK) 140 MG/ML SOAJ with a prior authorization. Patient would like to know if that can be re-prescribed and does not want Praluent anymore. If yes, they would need to be sent to the PA team. Best callback is [phone].

## 2023-05-29 ENCOUNTER — Ambulatory Visit (INDEPENDENT_AMBULATORY_CARE_PROVIDER_SITE_OTHER): Payer: Medicare Other

## 2023-05-29 ENCOUNTER — Telehealth: Payer: Self-pay

## 2023-05-29 DIAGNOSIS — Z7901 Long term (current) use of anticoagulants: Secondary | ICD-10-CM | POA: Diagnosis not present

## 2023-05-29 LAB — POCT INR: INR: 2 (ref 2.0–3.0)

## 2023-05-29 NOTE — Telephone Encounter (Signed)
Pt is due to test INR at home. LVM time to test.

## 2023-05-29 NOTE — Patient Instructions (Addendum)
Pre visit review using our clinic review tool, if applicable. No additional management support is needed unless otherwise documented below in the visit note.  Continue to take 1 tablet daily except take 1 1/2 on Wednesdays. Recheck in 4 weeks.

## 2023-05-29 NOTE — Progress Notes (Signed)
Pt tests at home and submits INR result on the [employer] website today. INR today is 2.0  Continue to take 1 tablet daily except take 1 1/2 on Wednesdays. Recheck in 4 weeks. Contacted pt and advised of dosing and retest date. Pt verbalized understanding.

## 2023-06-06 ENCOUNTER — Telehealth: Payer: Self-pay

## 2023-06-06 ENCOUNTER — Other Ambulatory Visit (HOSPITAL_COMMUNITY): Payer: Self-pay

## 2023-06-06 ENCOUNTER — Telehealth: Payer: Self-pay | Admitting: Internal Medicine

## 2023-06-06 NOTE — Telephone Encounter (Signed)
Patient's pharmacy called and said they started a prior authorization for Evolocumab (REPATHA SURECLICK) 140 MG/ML SOAJ. They provided a reference number of [id number]. Best callback is [phone].

## 2023-06-06 NOTE — Telephone Encounter (Signed)
Pharmacy Patient Advocate Encounter   Received notification from Pt Calls Messages that prior authorization for Repatha SureClick 140mg /ml is required/requested.   Insurance verification completed.   The patient is insured through [facility] .   Per test claim: PA required; PA submitted to above mentioned insurance via CoverMyMeds Key/confirmation #/EOC [id number] Status is pending

## 2023-06-07 ENCOUNTER — Other Ambulatory Visit (HOSPITAL_COMMUNITY): Payer: Self-pay

## 2023-06-07 NOTE — Telephone Encounter (Signed)
Pharmacy Patient Advocate Encounter  Received notification from [facility] that Prior Authorization for Repatha SureClick 140mg /ml has been APPROVED from 06/06/23 to 12/04/23   PA #/Case ID/Reference #: [id number]   Placed a call to [facility] to notify of the approval.

## 2023-06-26 ENCOUNTER — Ambulatory Visit (INDEPENDENT_AMBULATORY_CARE_PROVIDER_SITE_OTHER): Payer: Medicare Other

## 2023-06-26 DIAGNOSIS — Z7901 Long term (current) use of anticoagulants: Secondary | ICD-10-CM

## 2023-06-26 LAB — POCT INR: INR: 3 (ref 2.0–3.0)

## 2023-06-26 NOTE — Progress Notes (Signed)
Pt tests at home and submits INR result on the [employer] website today. INR today is 3.0 Pt started Repatha, no interaction with warfarin. Continue to take 1 tablet daily except take 1 1/2 on Wednesdays. Recheck in 3 weeks, 12/17, due to the holiday. Contacted pt and advised of dosing and retest date. Pt verbalized understanding.

## 2023-06-26 NOTE — Patient Instructions (Addendum)
Pre visit review using our clinic review tool, if applicable. No additional management support is needed unless otherwise documented below in the visit note.  Continue to take 1 tablet daily except take 1 1/2 on Wednesdays. Recheck in 3 weeks, 12/17, due to the holiday.

## 2023-06-29 ENCOUNTER — Other Ambulatory Visit: Payer: Self-pay

## 2023-07-02 NOTE — Progress Notes (Unsigned)
No chief complaint on file.  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 His surgery was at [facility] in 2003. He has had a residual thoracic and abdominal aortic dissection since then. Imaging showed large dissection extending from ascending aorta down into the abdominal aorta and iliacs. The false lumen in the descending thoracic aortic aneurysm is larger than the true lumen. This has been stable for years and CT surgery does not think surgical correction is indicated. There is some involvement of the arch vessels and renal arteries. He was also found to have a right lung nodule and lesions in the liver and kidneys that were hard to characterize. Echo May 2016 at [facility] with normal LV function, normally functioning AVR. He is on chronic coumadin for anticoagulation after mechanical AVR.  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 He has been seen in [facility], [facility] and is followed in [facility]. He is HIV negative.   CTA chest December 2022 with stable chronic aortic dissection. Echo February 2022 with LVEF=50-55%, normally functioning mechanical AVR. Given ongoing chemotherapy, echo was repeated in March 2024 LvEF=60-65%. Normally functioning mechanical aortic valve.   He is here today for follow up. The patient denies any chest pain, dyspnea, palpitations, lower extremity edema, orthopnea, PND, dizziness, near syncope or syncope.   Primary Care Physician: Corwin Levins, MD  Past Medical History:  Diagnosis Date   Anemia    iron def   Aortic valve prosthesis present    a. SJM - [facility] 2003, chronic coumadin.   Back pain    CKD (chronic kidney disease), stage II    Coronary artery disease    not sure where this dx came from - pt denies   Family history of melanoma     Hearing loss    Hyperlipidemia    Hypertension    Internal hemorrhoid    Kaposi sarcoma (HCC)    Obesity    Pericardial effusion    Thoracic aortic aneurysm (HCC)    a. s/p dissection and repair @ [facility] 2003 with SJM AVR with residual findings followed by CT.   Thyroid disease     Past Surgical History:  Procedure Laterality Date   ABDOMINAL AORTIC ANEURYSM REPAIR     AORTIC VALVE REPLACEMENT     impingment     right shoulder   IR IMAGING GUIDED PORT INSERTION  09/17/2020   KNEE ARTHROSCOPY     left   WISDOM TOOTH EXTRACTION      Current Outpatient Medications  Medication Sig Dispense Refill   dapagliflozin propanediol (FARXIGA) 10 MG TABS tablet Take 1 tablet (10 mg total) by mouth daily before breakfast. 90 tablet 3   Evolocumab (REPATHA SURECLICK) 140 MG/ML SOAJ Inject 140 mg into the skin every 14 (fourteen) days. 6 mL 3   fenofibrate 160 MG tablet Take 1 tablet (160 mg total) by mouth daily. 90 tablet 3   ferrous sulfate 325 (65 FE) MG tablet Take 1 tablet (325 mg total) by mouth daily with breakfast. 30 tablet 3   glucose blood (ONETOUCH VERIO) test strip Use to check blood sugars daily 100 each 3   hydrochlorothiazide (MICROZIDE) 12.5 MG capsule Take 1 capsule (12.5 mg total) by mouth daily. 90 capsule 3   Lancets (ONETOUCH ULTRASOFT) lancets Use as instructed  100 each 12   lidocaine-prilocaine (EMLA) cream Apply 1 application topically as needed. 30 g 0   losartan (COZAAR) 25 MG tablet Take 1 tablet (25 mg total) by mouth daily. 90 tablet 3   metoprolol (TOPROL-XL) 200 MG 24 hr tablet TAKE 1 TABLET DAILY 90 tablet 3   Multiple Vitamin (MULTIVITAMIN) tablet Take 1 tablet by mouth daily.     rosuvastatin (CRESTOR) 40 MG tablet TAKE 1 TABLET DAILY 90 tablet 3   warfarin (COUMADIN) 5 MG tablet TAKE ONE TABLET DAILY EXCEPT, TAKE ONE AND ONE-HALF TABLETS ON FRIDAYS OR TAKE AS DIRECTED BY ANTICOAGULATION CLINIC 100 tablet 3   No current facility-administered medications for  this visit.    Allergies  Allergen Reactions   Celecoxib Itching and Other (See Comments)    Joint pain    Social History   Socioeconomic History   Marital status: Single    Spouse name: Not on file   Number of children: 0   Years of education: MDIV   Highest education level: Professional school degree (e.g., MD, DDS, DVM, JD)  Occupational History   Occupation: Dietitian: [employer]  Tobacco Use   Smoking status: Former    Current packs/day: 0.00    Average packs/day: 1 pack/day for 15.0 years (15.0 ttl pk-yrs)    Types: Cigarettes    Start date: 10/11/1986    Quit date: 10/10/2001    Years since quitting: 21.7   Smokeless tobacco: Never  Substance and Sexual Activity   Alcohol use: Yes    Comment: one drink per day   Drug use: No   Sexual activity: Not Currently  Other Topics Concern   Not on file  Social History Narrative   Pt lives at home alone.   Caffeine Use: Less than 1 cup daily.   Social Determinants of Health   Financial Resource Strain: Low Risk  (03/31/2023)   Overall Financial Resource Strain (CARDIA)    Difficulty of Paying Living Expenses: Not hard at all  Food Insecurity: No Food Insecurity (03/31/2023)   Hunger Vital Sign    Worried About Running Out of Food in the Last Year: Never true    Ran Out of Food in the Last Year: Never true  Transportation Needs: No Transportation Needs (03/31/2023)   PRAPARE - Administrator, Civil Service (Medical): No    Lack of Transportation (Non-Medical): No  Physical Activity: Insufficiently Active (03/31/2023)   Exercise Vital Sign    Days of Exercise per Week: 7 days    Minutes of Exercise per Session: 20 min  Stress: No Stress Concern Present (03/31/2023)   [employer] of Occupational Health - Occupational Stress Questionnaire    Feeling of Stress : Not at all  Social Connections: Moderately Isolated (03/31/2023)   Social Connection and Isolation Panel [NHANES]     Frequency of Communication with Friends and Family: More than three times a week    Frequency of Social Gatherings with Friends and Family: Twice a week    Attends Religious Services: More than 4 times per year    Active Member of [employer] or Organizations: No    Attends Engineer, structural: Not on file    Marital Status: Never married  Intimate Partner Violence: Not on file    Family History  Problem Relation Age of Onset   Stroke Mother        late 6s   Stroke Father 3   Liver disease  Sister        Sclerosing cholegitis necessitating a liver transplant   Melanoma Maternal Uncle        dx in his 66s   Heart disease Paternal Aunt    Other Maternal Grandfather        died under the age of 64 due to a health related issue   Colon cancer Neg Hx     Review of Systems:  As stated in the HPI and otherwise negative.   There were no vitals taken for this visit.  Physical Examination: General: Well developed, well nourished, NAD  HEENT: OP clear, mucus membranes moist  SKIN: warm, dry. No rashes. Neuro: No focal deficits  Musculoskeletal: Muscle strength 5/5 all ext  Psychiatric: Mood and affect normal  Neck: No JVD, no carotid bruits, no thyromegaly, no lymphadenopathy.  Lungs:Clear bilaterally, no wheezes, rhonci, crackles Cardiovascular: Regular rate and rhythm. No murmurs, gallops or rubs. Abdomen:Soft. Bowel sounds present. Non-tender.  Extremities: No lower extremity edema. Pulses are 2 + in the bilateral DP/PT.  EKG:  EKG is *** ordered today. The ekg ordered today demonstrates   Recent Labs: 04/04/2023: TSH 3.35 05/11/2023: ALT 21; BUN 28; Creatinine 1.57; Hemoglobin 15.7; Platelet Count 151; Potassium 4.1; Sodium 138   Lipid Panel    Component Value Date/Time   CHOL 187 04/04/2023 1404   TRIG 177.0 (H) 04/04/2023 1404   HDL 44.70 04/04/2023 1404   CHOLHDL 4 04/04/2023 1404   VLDL 35.4 04/04/2023 1404   LDLCALC 107 (H) 04/04/2023 1404   LDLDIRECT 107.0  10/11/2021 1656     Wt Readings from Last 3 Encounters:  05/11/23 96.8 kg  04/04/23 96.2 kg  01/12/23 95.9 kg    Assessment and Plan:   1. THORACIC AORTIC ANEURYSM/DISSECTION: He is known to have a type A aortic dissection with extension from his thoracic aorta throughout both iliac arteries affecting the right renal artery but his has been stable. Stable by chest CTA December 2022.  *** Repeat Chest CTA now.    2. S/P aortic valve replacement: Mechanical valve functioning well by echo March 2024.   3. Mitral regurgitation: Trivial by echo March 2024.   4. HTN: BP is well controlled. No changes  Labs/ tests ordered today include:  No orders of the defined types were placed in this encounter.  Disposition:   F/U with me in 12  months  Signed, Verne Carrow, MD 07/02/2023 4:25 PM    [facility] [address] [place], [place], [place]  [address] Phone: [phone]; Fax: [phone]

## 2023-07-03 ENCOUNTER — Ambulatory Visit: Payer: Medicare Other | Attending: Cardiovascular Disease | Admitting: Cardiovascular Disease

## 2023-07-03 ENCOUNTER — Encounter: Payer: Self-pay | Admitting: Cardiovascular Disease

## 2023-07-03 VITALS — BP 106/68 | HR 59 | Ht 68.0 in | Wt 209.2 lb

## 2023-07-03 DIAGNOSIS — I359 Nonrheumatic aortic valve disorder, unspecified: Secondary | ICD-10-CM | POA: Diagnosis not present

## 2023-07-03 DIAGNOSIS — I712 Thoracic aortic aneurysm, without rupture, unspecified: Secondary | ICD-10-CM

## 2023-07-03 DIAGNOSIS — I10 Essential (primary) hypertension: Secondary | ICD-10-CM

## 2023-07-03 NOTE — Patient Instructions (Signed)
Medication Instructions:  No changes *If you need a refill on your cardiac medications before your next appointment, please call your pharmacy*   Lab Work: Today: bmet If you have labs (blood work) drawn today and your tests are completely normal, you will receive your results only by: MyChart Message (if you have MyChart) OR A paper copy in the mail If you have any lab test that is abnormal or we need to change your treatment, we will call you to review the results.   Testing/Procedures: Ct chest/abdomen/pelvis -    Follow-Up: At [facility], you and your health needs are our priority.  As part of our continuing mission to provide you with exceptional heart care, we have created designated Provider Care Teams.  These Care Teams include your primary Cardiologist (physician) and Advanced Practice Providers (APPs -  Physician Assistants and Nurse Practitioners) who all work together to provide you with the care you need, when you need it.   Your next appointment:   12 month(s)  Provider:   Verne Carrow, MD

## 2023-07-04 ENCOUNTER — Encounter: Payer: Self-pay | Admitting: Hematology and Oncology

## 2023-07-04 LAB — BASIC METABOLIC PANEL
BUN/Creatinine Ratio: 19 (ref 10–24)
BUN: 29 mg/dL — ABNORMAL HIGH (ref 8–27)
CO2: 27 mmol/L (ref 20–29)
Calcium: 9.6 mg/dL (ref 8.6–10.2)
Chloride: 104 mmol/L (ref 96–106)
Creatinine, Ser: 1.55 mg/dL — ABNORMAL HIGH (ref 0.76–1.27)
Glucose: 254 mg/dL — ABNORMAL HIGH (ref 70–99)
Potassium: 4.8 mmol/L (ref 3.5–5.2)
Sodium: 142 mmol/L (ref 134–144)
eGFR: 48 mL/min/{1.73_m2} — ABNORMAL LOW (ref >59)

## 2023-07-16 ENCOUNTER — Other Ambulatory Visit: Payer: Self-pay | Admitting: *Deleted

## 2023-07-16 DIAGNOSIS — I712 Thoracic aortic aneurysm, without rupture, unspecified: Secondary | ICD-10-CM

## 2023-07-16 NOTE — Progress Notes (Signed)
Message received from [facility] CT:  Austin [name],Austin [name],dob [birth year] ordered for ct cap with contrast, needs to be changed to ct angio chest,abdomen/pelvis with/without contrast. please sign new order in epic. thanks - ct [place]  New orders placed.

## 2023-07-17 ENCOUNTER — Ambulatory Visit (HOSPITAL_COMMUNITY)
Admission: RE | Admit: 2023-07-17 | Discharge: 2023-07-17 | Disposition: A | Discharge status: Home / Self Care | Payer: Medicare Other | Source: Ambulatory Visit | Attending: Cardiovascular Disease | Admitting: Cardiovascular Disease

## 2023-07-17 ENCOUNTER — Ambulatory Visit (INDEPENDENT_AMBULATORY_CARE_PROVIDER_SITE_OTHER): Payer: Medicare Other

## 2023-07-17 ENCOUNTER — Other Ambulatory Visit: Payer: Self-pay | Admitting: Cardiovascular Disease

## 2023-07-17 ENCOUNTER — Ambulatory Visit (HOSPITAL_COMMUNITY): Payer: Medicare Other

## 2023-07-17 DIAGNOSIS — Z7901 Long term (current) use of anticoagulants: Secondary | ICD-10-CM | POA: Diagnosis not present

## 2023-07-17 DIAGNOSIS — I712 Thoracic aortic aneurysm, without rupture, unspecified: Secondary | ICD-10-CM | POA: Diagnosis present

## 2023-07-17 LAB — POCT INR: INR: 2.8 (ref 2.0–3.0)

## 2023-07-17 MED ORDER — HEPARIN SOD (PORK) LOCK FLUSH 100 UNIT/ML IV SOLN
500.0000 [IU] | Freq: Once | INTRAVENOUS | Status: AC
  Administered 2023-07-17: 500 [IU] via INTRAVENOUS

## 2023-07-17 MED ORDER — IOHEXOL 350 MG/ML SOLN
80.0000 mL | Freq: Once | INTRAVENOUS | Status: AC | PRN
  Administered 2023-07-17: 80 mL via INTRAVENOUS

## 2023-07-17 NOTE — Progress Notes (Signed)
Pt tests at home and submits INR result on the [employer] website today. INR today is 2.8 Continue to take 1 tablet daily except take 1 1/2 on Wednesdays. Recheck in 4 weeks, on 08/14/23. Contacted pt and advised of dosing and retest date. Pt verbalized understanding.

## 2023-07-17 NOTE — Patient Instructions (Addendum)
Pre visit review using our clinic review tool, if applicable. No additional management support is needed unless otherwise documented below in the visit note.  Continue to take 1 tablet daily except take 1 1/2 on Wednesdays. Recheck in 4 weeks, on 08/14/23.

## 2023-08-17 ENCOUNTER — Ambulatory Visit: Payer: Medicare Other

## 2023-08-17 DIAGNOSIS — Z7901 Long term (current) use of anticoagulants: Secondary | ICD-10-CM

## 2023-08-17 LAB — POCT INR: INR: 2.7 (ref 2.0–3.0)

## 2023-08-17 NOTE — Patient Instructions (Addendum)
Pre visit review using our clinic review tool, if applicable. No additional management support is needed unless otherwise documented below in the visit note.  Continue to take 1 tablet daily except take 1 1/2 on Wednesdays. Recheck in 6 weeks. 

## 2023-08-17 NOTE — Progress Notes (Signed)
Pt has decided to stop home testing due to frustration with using the machine. He will contact [employer] and let them know so he can return the machine.  Continue to take 1 tablet daily except take 1 1/2 on Wednesdays. Recheck in 6 weeks.

## 2023-08-19 ENCOUNTER — Other Ambulatory Visit: Payer: Self-pay

## 2023-09-07 ENCOUNTER — Encounter: Payer: Self-pay | Admitting: Hematology and Oncology

## 2023-09-07 ENCOUNTER — Inpatient Hospital Stay: Payer: Medicare Other | Attending: Hematology and Oncology

## 2023-09-07 ENCOUNTER — Inpatient Hospital Stay: Payer: Medicare Other

## 2023-09-07 ENCOUNTER — Inpatient Hospital Stay: Payer: Medicare Other | Admitting: Hematology and Oncology

## 2023-09-07 VITALS — BP 122/74 | HR 63 | Resp 16

## 2023-09-07 VITALS — BP 125/76 | HR 81 | Temp 97.9°F | Resp 18 | Ht 68.0 in | Wt 204.4 lb

## 2023-09-07 DIAGNOSIS — C469 Kaposi's sarcoma, unspecified: Secondary | ICD-10-CM | POA: Insufficient documentation

## 2023-09-07 DIAGNOSIS — Z95828 Presence of other vascular implants and grafts: Secondary | ICD-10-CM

## 2023-09-07 DIAGNOSIS — E119 Type 2 diabetes mellitus without complications: Secondary | ICD-10-CM

## 2023-09-07 DIAGNOSIS — E1122 Type 2 diabetes mellitus with diabetic chronic kidney disease: Secondary | ICD-10-CM | POA: Insufficient documentation

## 2023-09-07 DIAGNOSIS — N183 Chronic kidney disease, stage 3 unspecified: Secondary | ICD-10-CM | POA: Insufficient documentation

## 2023-09-07 DIAGNOSIS — Z5111 Encounter for antineoplastic chemotherapy: Secondary | ICD-10-CM | POA: Diagnosis present

## 2023-09-07 DIAGNOSIS — R972 Elevated prostate specific antigen [PSA]: Secondary | ICD-10-CM

## 2023-09-07 DIAGNOSIS — N1831 Chronic kidney disease, stage 3a: Secondary | ICD-10-CM

## 2023-09-07 DIAGNOSIS — E1142 Type 2 diabetes mellitus with diabetic polyneuropathy: Secondary | ICD-10-CM | POA: Insufficient documentation

## 2023-09-07 LAB — CBC WITH DIFFERENTIAL (CANCER CENTER ONLY)
Abs Immature Granulocytes: 0.01 10*3/uL (ref 0.00–0.07)
Basophils Absolute: 0 10*3/uL (ref 0.0–0.1)
Basophils Relative: 1 %
Eosinophils Absolute: 0.2 10*3/uL (ref 0.0–0.5)
Eosinophils Relative: 5 %
HCT: 46.2 % (ref 39.0–52.0)
Hemoglobin: 16 g/dL (ref 13.0–17.0)
Immature Granulocytes: 0 %
Lymphocytes Relative: 25 %
Lymphs Abs: 1.1 10*3/uL (ref 0.7–4.0)
MCH: 28.7 pg (ref 26.0–34.0)
MCHC: 34.6 g/dL (ref 30.0–36.0)
MCV: 82.8 fL (ref 80.0–100.0)
Monocytes Absolute: 0.5 10*3/uL (ref 0.1–1.0)
Monocytes Relative: 12 %
Neutro Abs: 2.5 10*3/uL (ref 1.7–7.7)
Neutrophils Relative %: 57 %
Platelet Count: 181 10*3/uL (ref 150–400)
RBC: 5.58 MIL/uL (ref 4.22–5.81)
RDW: 12.6 % (ref 11.5–15.5)
WBC Count: 4.3 10*3/uL (ref 4.0–10.5)
nRBC: 0 % (ref 0.0–0.2)

## 2023-09-07 LAB — CMP (CANCER CENTER ONLY)
ALT: 16 U/L (ref 0–44)
AST: 23 U/L (ref 15–41)
Albumin: 4.3 g/dL (ref 3.5–5.0)
Alkaline Phosphatase: 34 U/L — ABNORMAL LOW (ref 38–126)
Anion gap: 7 (ref 5–15)
BUN: 35 mg/dL — ABNORMAL HIGH (ref 8–23)
CO2: 27 mmol/L (ref 22–32)
Calcium: 9 mg/dL (ref 8.9–10.3)
Chloride: 101 mmol/L (ref 98–111)
Creatinine: 1.64 mg/dL — ABNORMAL HIGH (ref 0.61–1.24)
GFR, Estimated: 45 mL/min — ABNORMAL LOW (ref >60)
Glucose, Bld: 437 mg/dL — ABNORMAL HIGH (ref 70–99)
Potassium: 4.4 mmol/L (ref 3.5–5.1)
Sodium: 135 mmol/L (ref 135–145)
Total Bilirubin: 0.8 mg/dL (ref 0.0–1.2)
Total Protein: 6.9 g/dL (ref 6.5–8.1)

## 2023-09-07 MED ORDER — DEXTROSE 5 % IV SOLN: Freq: Once | INTRAVENOUS | Status: AC

## 2023-09-07 MED ORDER — SODIUM CHLORIDE 0.9% FLUSH
10.0000 mL | Freq: Once | INTRAVENOUS | Status: AC
  Administered 2023-09-07: 10 mL

## 2023-09-07 MED ORDER — DEXAMETHASONE SODIUM PHOSPHATE 10 MG/ML IJ SOLN
10.0000 mg | Freq: Once | INTRAMUSCULAR | Status: AC
  Administered 2023-09-07: 10 mg via INTRAVENOUS
  Filled 2023-09-07: qty 1

## 2023-09-07 MED ORDER — HEPARIN SOD (PORK) LOCK FLUSH 100 UNIT/ML IV SOLN
500.0000 [IU] | Freq: Once | INTRAVENOUS | Status: AC | PRN
  Administered 2023-09-07: 500 [IU]

## 2023-09-07 MED ORDER — DOXORUBICIN HCL LIPOSOMAL CHEMO INJECTION 2 MG/ML
27.0000 mg/m2 | Freq: Once | INTRAVENOUS | Status: AC
  Administered 2023-09-07: 60 mg via INTRAVENOUS
  Filled 2023-09-07: qty 30

## 2023-09-07 MED ORDER — SODIUM CHLORIDE 0.9% FLUSH
10.0000 mL | INTRAVENOUS | Status: DC | PRN
  Administered 2023-09-07: 10 mL

## 2023-09-07 NOTE — Patient Instructions (Signed)
 [facility] - A DEPT OF [facility]   Discharge Instructions: Thank you for choosing [facility] to provide your oncology and hematology care.   If you have a lab appointment with the Cancer Center, please go directly to the Cancer Center and check in at the registration area.   Wear comfortable clothing and clothing appropriate for easy access to any Portacath or PICC line.   We strive to give you quality time with your provider. You may need to reschedule your appointment if you arrive late (15 or more minutes).  Arriving late affects you and other patients whose appointments are after yours.  Also, if you miss three or more appointments without notifying the office, you may be dismissed from the clinic at the provider's discretion.      For prescription refill requests, have your pharmacy contact our office and allow 72 hours for refills to be completed.    Today you received the following chemotherapy and/or immunotherapy agents: Liposomal doxorubicin  (Doxil )      To help prevent nausea and vomiting after your treatment, we encourage you to take your nausea medication as directed.  BELOW ARE SYMPTOMS THAT SHOULD BE REPORTED IMMEDIATELY: *FEVER GREATER THAN 100.4 F (38 C) OR HIGHER *CHILLS OR SWEATING *NAUSEA AND VOMITING THAT IS NOT CONTROLLED WITH YOUR NAUSEA MEDICATION *UNUSUAL SHORTNESS OF BREATH *UNUSUAL BRUISING OR BLEEDING *URINARY PROBLEMS (pain or burning when urinating, or frequent urination) *BOWEL PROBLEMS (unusual diarrhea, constipation, pain near the anus) TENDERNESS IN MOUTH AND THROAT WITH OR WITHOUT PRESENCE OF ULCERS (sore throat, sores in mouth, or a toothache) UNUSUAL RASH, SWELLING OR PAIN  UNUSUAL VAGINAL DISCHARGE OR ITCHING   Items with * indicate a potential emergency and should be followed up as soon as possible or go to the Emergency Department if any problems should occur.  Please show the CHEMOTHERAPY ALERT  CARD or IMMUNOTHERAPY ALERT CARD at check-in to the Emergency Department and triage nurse.  Should you have questions after your visit or need to cancel or reschedule your appointment, please contact [facility] - A DEPT OF [facility]  Dept: [phone]  and follow the prompts.  Office hours are 8:00 a.m. to 4:30 p.m. Monday - Friday. Please note that voicemails left after 4:00 p.m. may not be returned until the following business day.  We are closed weekends and major holidays. You have access to a nurse at all times for urgent questions. Please call the main number to the clinic Dept: [phone] and follow the prompts.   For any non-urgent questions, you may also contact your provider using MyChart. We now offer e-Visits for anyone 67 and older to request care online for non-urgent symptoms. For details visit [email or web address].   Also download the MyChart app! Go to the app store, search MyChart, open the app, select [place], and log in with your MyChart username and password.

## 2023-09-07 NOTE — Progress Notes (Signed)
 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 from her prior physicians and specialist for treatment of recurrent sarcoma For now, we will continue liposomal doxorubicin  every 4 months Clinically, he does not have any evidence of relapse on exam There is no indicated for imaging He had repeat echocardiogram done recently that showed preserved ejection fraction We will monitor echocardiogram every 1 to 2 years We will proceed with treatment  Newly diagnosed diabetes [facility]) The patient has noticed peripheral neuropathy His recent hemoglobin A1c from last year was high but the patient is not on any medication for treatment except for Farxiga  His blood sugar today is very high I recommend the patient to reach out to his primary care doctor for treatment  CKD (chronic kidney disease), stage III (HCC) He is on multiple medications and under care of cardiologist Monitor closely He does not need dose adjustment  Orders Placed This Encounter  Procedures   CBC with Differential (Cancer Center Only)    Standing Status:   Future    Expected Date:   01/04/2024    Expiration Date:   01/03/2025   CMP (Cancer Center only)    Standing Status:   Future    Expected Date:   01/04/2024    Expiration Date:   01/03/2025   CBC with Differential (Cancer Center Only)    Standing Status:   Future    Expected Date:   05/03/2024    Expiration Date:   05/03/2025   CMP (Cancer Center only)    Standing Status:   Future    Expected Date:   05/03/2024    Expiration Date:   05/03/2025    All questions were answered. The patient knows to call the clinic with any problems, questions or concerns. The total time spent in the appointment was 30 minutes encounter with patients including review of chart and various tests results,  discussions about plan of care and coordination of care plan   Almarie Bedford, MD 09/07/2023 1:20 PM  INTERVAL HISTORY: Please see below for problem oriented charting. he returns for chemo follow-up He tolerated treatment well Denies mucositis or nausea He has noticed some altered sensation of his feet What he describes sounds like neuropathy but this is not caused by his treatment On review of his previous blood work, it was noted that the patient has elevated hemoglobin A1c He is not adherent to diabetes diet He also drink alcohol After the patient was seen, repeat CMP comes back showing very high blood sugar level The patient is instructed to reach out to his primary care doctor for treatment  REVIEW OF SYSTEMS:   Constitutional: Denies fevers, chills or abnormal weight loss Eyes: Denies blurriness of vision Ears, nose, mouth, throat, and face: Denies mucositis or sore throat Respiratory: Denies cough, dyspnea or wheezes Cardiovascular: Denies palpitation, chest discomfort or lower extremity swelling Gastrointestinal:  Denies nausea, heartburn or change in bowel habits Skin: Denies abnormal skin rashes Lymphatics: Denies new lymphadenopathy or easy bruising Behavioral/Psych: Mood is stable, no new changes  All other systems were reviewed with the patient and are negative.  I have reviewed the past medical history, past surgical history, social history and family history with the patient and they are unchanged from previous note.  ALLERGIES:  is allergic to celecoxib.  MEDICATIONS:  Current  Outpatient Medications  Medication Sig Dispense Refill   dapagliflozin  propanediol (FARXIGA ) 10 MG TABS tablet Take 1 tablet (10 mg total) by mouth daily before breakfast. 90 tablet 3   Evolocumab  (REPATHA  SURECLICK) 140 MG/ML SOAJ Inject 140 mg into the skin every 14 (fourteen) days. 6 mL 3   fenofibrate  160 MG tablet Take 1 tablet (160 mg total) by mouth daily. 90 tablet 3   glucose blood  (ONETOUCH VERIO) test strip Use to check blood sugars daily 100 each 3   hydrochlorothiazide  (MICROZIDE ) 12.5 MG capsule Take 1 capsule (12.5 mg total) by mouth daily. 90 capsule 3   Lancets (ONETOUCH ULTRASOFT) lancets Use as instructed 100 each 12   lidocaine -prilocaine  (EMLA ) cream Apply 1 application topically as needed. 30 g 0   losartan  (COZAAR ) 25 MG tablet Take 1 tablet (25 mg total) by mouth daily. 90 tablet 3   metoprolol  (TOPROL -XL) 200 MG 24 hr tablet TAKE 1 TABLET DAILY 90 tablet 3   Multiple Vitamin (MULTIVITAMIN) tablet Take 1 tablet by mouth daily.     rosuvastatin  (CRESTOR ) 40 MG tablet TAKE 1 TABLET DAILY 90 tablet 3   warfarin (COUMADIN ) 5 MG tablet TAKE ONE TABLET DAILY EXCEPT, TAKE ONE AND ONE-HALF TABLETS ON FRIDAYS OR TAKE AS DIRECTED BY ANTICOAGULATION CLINIC 100 tablet 3   No current facility-administered medications for this visit.   Facility-Administered Medications Ordered in Other Visits  Medication Dose Route Frequency Provider Last Rate Last Admin   sodium chloride  flush (NS) 0.9 % injection 10 mL  10 mL Intracatheter PRN Lonn Hicks, MD   10 mL at 09/07/23 1136    SUMMARY OF ONCOLOGIC HISTORY: Oncology History  Kaposi sarcoma (HCC)  11/09/2014 Pathology Results   A.  Outside case, [phone], [facility] Daniel Mcalpine, [place].  Date of procedure 11-09-14:   Skin, right lower leg, biopsy:     Kaposi sarcoma. See comment.   Comment:  Immunohistochemistry performed at the outside institution was reviewed.  The tumor cells are positive for HHV8, consistent with Kaposi sarcoma.   02/03/2015 Initial Diagnosis   Kaposi sarcoma (HCC)   04/04/2018 - 02/08/2022 Chemotherapy   Patient is on Treatment Plan : KAPOSIS SARCOMA Liposomal Doxorubicin  q28d     05/09/2022 -  Chemotherapy   Patient is on Treatment Plan : KAPOSIS SARCOMA Liposomal Doxorubicin  (20) q 4 MONTHS     08/31/2022 Cancer Staging   Staging form: Soft Tissue Sarcoma, AJCC 7th Edition -  Clinical stage from 08/31/2022: Stage Unknown (rTX, N0, M0) - Signed by Lonn Hicks, MD on 08/31/2022 Stage prefix: Recurrence Biopsy of metastatic site performed: Yes   10/09/2022 Echocardiogram   1. Moderate septal hypertrophy with otherwise mild concentric LVH. Left ventricular ejection fraction, by estimation, is 60 to 65%. Left ventricular ejection fraction by PLAX is 64 %. The left ventricle has  normal function. The left ventricle has no regional wall motion abnormalities. There is mild left ventricular hypertrophy of the septal segment. Left ventricular diastolic parameters are consistent with Grade II diastolic dysfunction (pseudonormalization).  Elevated left ventricular end-diastolic pressure.   2. Right ventricular systolic function is normal. The right ventricular size is normal. There is normal pulmonary artery systolic pressure.   3. Left atrial size was moderately dilated.   4. Right atrial size was moderately dilated.   5. The mitral valve is normal in structure. Trivial mitral valve  regurgitation. No evidence of mitral stenosis.   6. Prosthetic valve mean gradient has increased from 18 mmHg in  2022 to 22 mmHg now. However, the acceleration time is 0.87 ms and DVI 0.31 consistent with a normally functioning mechanical valve. 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 The aortic valve has been repaired/replaced. Aortic valve regurgitation is trivial. Mild to moderate aortic valve stenosis. Aortic regurgitation PHT measures 533 msec. Aortic valve area, by VTI measures 1.50 cm. Aortic valve mean gradient measures 11.4 mmHg. Aortic valve Vmax measures 3.30 m/s.   7. Aortic root/ascending aorta has been repaired/replaced. There is mild dilatation of the aortic root, measuring 40 mm. There is mild dilatation of the ascending aorta, measuring 36 mm.   8. The inferior vena cava is normal in size with greater than 50% respiratory variability, suggesting right atrial pressure of 3 mmHg.       PHYSICAL EXAMINATION: ECOG PERFORMANCE STATUS: 1 - Symptomatic but completely ambulatory  Vitals:   09/07/23 0834  BP: 125/76  Pulse: 81  Resp: 18  Temp: 97.9 F (36.6 C)  SpO2: 95%   Filed Weights   09/07/23 0834  Weight: 204 lb 6.4 oz (92.7 kg)    GENERAL:alert, no distress and comfortable NEURO: alert & oriented x 3 with fluent speech, no focal motor/sensory deficits  LABORATORY DATA:  I have reviewed the data as listed    Component Value Date/Time   NA 135 09/07/2023 0820   NA 142 07/03/2023 0951   NA 143 11/03/2016 0908   K 4.4 09/07/2023 0820   K 5.0 11/03/2016 0908   CL 101 09/07/2023 0820   CO2 27 09/07/2023 0820   CO2 27 11/03/2016 0908   GLUCOSE 437 (H) 09/07/2023 0820   GLUCOSE 70 11/03/2016 0908   BUN 35 (H) 09/07/2023 0820   BUN 29 (H) 07/03/2023 0951   BUN 33.5 (H) 11/03/2016 0908   CREATININE 1.64 (H) 09/07/2023 0820   CREATININE 1.5 (H) 11/03/2016 0908   CALCIUM  9.0 09/07/2023 0820   CALCIUM  9.5 11/03/2016 0908   PROT 6.9 09/07/2023 0820   PROT 6.4 11/03/2016 0908   ALBUMIN 4.3 09/07/2023 0820   ALBUMIN 4.2 11/03/2016 0908   AST 23 09/07/2023 0820   AST 29 11/03/2016 0908   ALT 16 09/07/2023 0820   ALT 29 11/03/2016 0908   ALKPHOS 34 (L) 09/07/2023 0820   ALKPHOS 25 (L) 11/03/2016 0908   BILITOT 0.8 09/07/2023 0820   BILITOT 0.70 11/03/2016 0908   GFRNONAA 45 (L) 09/07/2023 0820   GFRAA 53 (L) 05/26/2019 1020   GFRAA 58 (L) 02/20/2019 0922    No results found for: SPEP, UPEP  Lab Results  Component Value Date   WBC 4.3 09/07/2023   NEUTROABS 2.5 09/07/2023   HGB 16.0 09/07/2023   HCT 46.2 09/07/2023   MCV 82.8 09/07/2023   PLT 181 09/07/2023      Chemistry      Component Value Date/Time   NA 135 09/07/2023 0820   NA 142 07/03/2023 0951   NA 143 11/03/2016 0908   K 4.4 09/07/2023 0820   K 5.0 11/03/2016 0908   CL 101 09/07/2023 0820   CO2 27 09/07/2023 0820   CO2 27 11/03/2016 0908   BUN 35 (H) 09/07/2023 0820    BUN 29 (H) 07/03/2023 0951   BUN 33.5 (H) 11/03/2016 0908   CREATININE 1.64 (H) 09/07/2023 0820   CREATININE 1.5 (H) 11/03/2016 0908      Component Value Date/Time   CALCIUM  9.0 09/07/2023 0820   CALCIUM  9.5 11/03/2016 0908   ALKPHOS 34 (L) 09/07/2023 0820  ALKPHOS 25 (L) 11/03/2016 0908   AST 23 09/07/2023 0820   AST 29 11/03/2016 0908   ALT 16 09/07/2023 0820   ALT 29 11/03/2016 0908   BILITOT 0.8 09/07/2023 0820   BILITOT 0.70 11/03/2016 0908

## 2023-09-07 NOTE — Progress Notes (Signed)
 Per Dr. Lonn - okay to proceed with treatment with creatinine of 1.64 and glucose of 437. No orders to adminsiter insulin in the clinic.  Patient aware that he needs to follow-up with PCP for further management of elevated blood sugars. Patient verbalized an understanding of the information.

## 2023-09-07 NOTE — Assessment & Plan Note (Signed)
 The patient has noticed peripheral neuropathy His recent hemoglobin A1c from last year was high but the patient is not on any medication for treatment except for Farxiga  His blood sugar today is very high I recommend the patient to reach out to his primary care doctor for treatment

## 2023-09-07 NOTE — Assessment & Plan Note (Signed)
I have reviewed recommendation from her prior physicians and specialist for treatment of recurrent sarcoma For now, we will continue liposomal doxorubicin every 4 months Clinically, he does not have any evidence of relapse on exam There is no indicated for imaging He had repeat echocardiogram done recently that showed preserved ejection fraction We will monitor echocardiogram every 1 to 2 years We will proceed with treatment

## 2023-09-07 NOTE — Assessment & Plan Note (Signed)
He is on multiple medications and under care of cardiologist Monitor closely He does not need dose adjustment

## 2023-09-08 ENCOUNTER — Other Ambulatory Visit: Payer: Self-pay

## 2023-09-21 ENCOUNTER — Other Ambulatory Visit: Payer: Self-pay

## 2023-09-28 ENCOUNTER — Ambulatory Visit: Payer: Medicare Other

## 2023-09-28 DIAGNOSIS — Z7901 Long term (current) use of anticoagulants: Secondary | ICD-10-CM

## 2023-09-28 LAB — POCT INR: INR: 2.9 (ref 2.0–3.0)

## 2023-09-28 NOTE — Patient Instructions (Addendum)
Pre visit review using our clinic review tool, if applicable. No additional management support is needed unless otherwise documented below in the visit note.  Continue to take 1 tablet daily except take 1 1/2 on Wednesdays. Recheck in 6 weeks. 

## 2023-09-28 NOTE — Progress Notes (Signed)
Continue to take 1 tablet daily except take 1 1/2 on Wednesdays. Recheck in 6 weeks. 

## 2023-10-03 ENCOUNTER — Other Ambulatory Visit: Payer: Self-pay

## 2023-11-09 ENCOUNTER — Ambulatory Visit: Payer: Medicare Other

## 2023-11-09 DIAGNOSIS — Z7901 Long term (current) use of anticoagulants: Secondary | ICD-10-CM | POA: Diagnosis not present

## 2023-11-09 LAB — POCT INR: INR: 4.1 — AB (ref 2.0–3.0)

## 2023-11-09 NOTE — Patient Instructions (Addendum)
 Pre visit review using our clinic review tool, if applicable. No additional management support is needed unless otherwise documented below in the visit note.  Hold dose tomorrow and then change weekly dose to take 1 tablet daily. Recheck in 2 weeks.

## 2023-11-09 NOTE — Progress Notes (Signed)
 Pt reports bilateral lower leg edema for the last couple of weeks. Advised this will increase INR. Advised he should have an apt with PCP. Pt agreed and has been schedule for 4/14, Monday.  Hold dose tomorrow and then change weekly dose to take 1 tablet daily. Recheck in 2 weeks.

## 2023-11-12 ENCOUNTER — Encounter: Payer: Self-pay | Admitting: Internal Medicine

## 2023-11-12 ENCOUNTER — Ambulatory Visit: Admitting: Internal Medicine

## 2023-11-12 VITALS — BP 120/80 | HR 55 | Temp 98.3°F | Ht 68.0 in | Wt 203.0 lb

## 2023-11-12 DIAGNOSIS — E1165 Type 2 diabetes mellitus with hyperglycemia: Secondary | ICD-10-CM | POA: Diagnosis not present

## 2023-11-12 DIAGNOSIS — Z125 Encounter for screening for malignant neoplasm of prostate: Secondary | ICD-10-CM

## 2023-11-12 DIAGNOSIS — E538 Deficiency of other specified B group vitamins: Secondary | ICD-10-CM | POA: Diagnosis not present

## 2023-11-12 DIAGNOSIS — E039 Hypothyroidism, unspecified: Secondary | ICD-10-CM

## 2023-11-12 DIAGNOSIS — E611 Iron deficiency: Secondary | ICD-10-CM | POA: Diagnosis not present

## 2023-11-12 DIAGNOSIS — Z0000 Encounter for general adult medical examination without abnormal findings: Secondary | ICD-10-CM

## 2023-11-12 DIAGNOSIS — E119 Type 2 diabetes mellitus without complications: Secondary | ICD-10-CM

## 2023-11-12 DIAGNOSIS — E559 Vitamin D deficiency, unspecified: Secondary | ICD-10-CM

## 2023-11-12 DIAGNOSIS — Z0001 Encounter for general adult medical examination with abnormal findings: Secondary | ICD-10-CM

## 2023-11-12 DIAGNOSIS — Z7984 Long term (current) use of oral hypoglycemic drugs: Secondary | ICD-10-CM

## 2023-11-12 DIAGNOSIS — I10 Essential (primary) hypertension: Secondary | ICD-10-CM

## 2023-11-12 DIAGNOSIS — R600 Localized edema: Secondary | ICD-10-CM

## 2023-11-12 DIAGNOSIS — N1831 Chronic kidney disease, stage 3a: Secondary | ICD-10-CM

## 2023-11-12 DIAGNOSIS — E7800 Pure hypercholesterolemia, unspecified: Secondary | ICD-10-CM | POA: Diagnosis not present

## 2023-11-12 MED ORDER — FUROSEMIDE 40 MG PO TABS: 40.0000 mg | ORAL_TABLET | Freq: Every day | ORAL | 3 refills | Status: DC

## 2023-11-12 NOTE — Patient Instructions (Signed)
 Ok to change the HCT microzide to lasix (furosemide) 40 mg per day  Please continue all other medications as before, and refills have been done if requested.  Please have the pharmacy call with any other refills you may need.  Please continue your efforts at being more active, low cholesterol diet, and weight control.  You are otherwise up to date with prevention measures today.  Please keep your appointments with your specialists as you may have planned  Please go to the LAB at the blood drawing area for the tests to be done - in TWO WEEKS  You will be contacted by phone if any changes need to be made immediately.  Otherwise, you will receive a letter about your results with an explanation, but please check with MyChart first.  Please make an Appointment to return in 6 months, or sooner if needed

## 2023-11-12 NOTE — Progress Notes (Unsigned)
 Patient ID: Austin [name], male   DOB: 06-20-1956, 68 y.o.   MRN: [id number]         Chief Complaint:: wellness exam and leg swelling, ckd3a, dm, htn, low thyroid       HPI:  Austin [name] is a 68 y.o. male here for wellness exam; plans to make his own optho appt soon, o/w up to date                        Also taking repatha and tolerating.  Pt denies chest pain, increased sob or doe, wheezing, orthopnea, PND, palpitations, dizziness or syncope, but has worsening leg swelling to the knees. .   Pt denies polydipsia, polyuria, or new focal neuro s/s.    Pt denies fever, wt loss, night sweats, loss of appetite, or other constitutional symptoms     Wt Readings from Last 3 Encounters:  11/12/23 203 lb (92.1 kg)  09/07/23 204 lb 6.4 oz (92.7 kg)  07/03/23 209 lb 3.2 oz (94.9 kg)   BP Readings from Last 3 Encounters:  11/12/23 120/80  09/07/23 122/74  09/07/23 125/76   Immunization History  Administered Date(s) Administered   Fluad Trivalent(High Dose 65+) 04/04/2023   Influenza Split 05/15/2011, 07/29/2012   Influenza Whole 07/19/2005, 04/30/2006, 08/12/2009   Influenza,inj,Quad PF,6+ Mos 05/13/2013, 07/01/2014, 05/21/2015, 04/21/2016, 04/11/2017, 05/02/2018, 04/22/2019   Influenza-Unspecified 05/03/2020   PFIZER Comirnaty(Gray Top)Covid-19 Tri-Sucrose Vaccine 11/02/2020   PFIZER(Purple Top)SARS-COV-2 Vaccination 10/13/2019, 11/03/2019, 05/03/2020, 04/18/2021   Pneumococcal Conjugate-13 07/03/2013   Pneumococcal Polysaccharide-23 03/07/2006, 10/11/2021   Td 03/30/2009   Tdap 09/17/2019   Zoster Recombinant(Shingrix) 05/01/2022, 07/03/2022   Health Maintenance Due  Topic Date Due   Medicare Annual Wellness (AWV)  Never done   HEMOGLOBIN A1C  10/02/2023   OPHTHALMOLOGY EXAM  10/13/2023      Past Medical History:  Diagnosis Date   Anemia    iron def   Aortic valve prosthesis present    a. SJM - [facility] 2003, chronic coumadin.   Back pain    CKD (chronic kidney disease),  stage II    Coronary artery disease    not sure where this dx came from - pt denies   Family history of melanoma    Hearing loss    Hyperlipidemia    Hypertension    Internal hemorrhoid    Kaposi sarcoma (HCC)    Obesity    Pericardial effusion    Thoracic aortic aneurysm (HCC)    a. s/p dissection and repair @ [facility] 2003 with SJM AVR with residual findings followed by CT.   Thyroid disease    Past Surgical History:  Procedure Laterality Date   ABDOMINAL AORTIC ANEURYSM REPAIR     AORTIC VALVE REPLACEMENT     impingment     right shoulder   IR IMAGING GUIDED PORT INSERTION  09/17/2020   KNEE ARTHROSCOPY     left   WISDOM TOOTH EXTRACTION      reports that he quit smoking about 22 years ago. His smoking use included cigarettes. He started smoking about 37 years ago. He has a 15 pack-year smoking history. He has never used smokeless tobacco. He reports current alcohol use. He reports that he does not use drugs. family history includes Heart disease in his paternal aunt; Liver disease in his sister; Melanoma in his maternal uncle; Other in his maternal grandfather; Stroke in his mother; Stroke (age of onset: 78) in his father. Allergies  Allergen  Reactions   Celecoxib Itching and Other (See Comments)    Joint pain   Current Outpatient Medications on File Prior to Visit  Medication Sig Dispense Refill   dapagliflozin propanediol (FARXIGA) 10 MG TABS tablet Take 1 tablet (10 mg total) by mouth daily before breakfast. 90 tablet 3   Evolocumab (REPATHA SURECLICK) 140 MG/ML SOAJ Inject 140 mg into the skin every 14 (fourteen) days. 6 mL 3   fenofibrate 160 MG tablet Take 1 tablet (160 mg total) by mouth daily. 90 tablet 3   glucose blood (ONETOUCH VERIO) test strip Use to check blood sugars daily 100 each 3   Lancets (ONETOUCH ULTRASOFT) lancets Use as instructed 100 each 12   lidocaine-prilocaine (EMLA) cream Apply 1 application topically as needed. 30 g 0   losartan (COZAAR) 25 MG  tablet Take 1 tablet (25 mg total) by mouth daily. 90 tablet 3   metoprolol (TOPROL-XL) 200 MG 24 hr tablet TAKE 1 TABLET DAILY 90 tablet 3   Multiple Vitamin (MULTIVITAMIN) tablet Take 1 tablet by mouth daily.     rosuvastatin (CRESTOR) 40 MG tablet TAKE 1 TABLET DAILY 90 tablet 3   warfarin (COUMADIN) 5 MG tablet TAKE ONE TABLET DAILY EXCEPT, TAKE ONE AND ONE-HALF TABLETS ON FRIDAYS OR TAKE AS DIRECTED BY ANTICOAGULATION CLINIC 100 tablet 3   No current facility-administered medications on file prior to visit.        ROS:  All others reviewed and negative.  Objective        PE:  BP 120/80 (BP Location: Right Arm, Patient Position: Sitting, Cuff Size: Normal)   Pulse (!) 55   Temp 98.3 F (36.8 C) (Oral)   Ht 5\' 8"  (1.727 m)   Wt 203 lb (92.1 kg)   SpO2 98%   BMI 30.87 kg/m                 Constitutional: Pt appears in NAD               HENT: Head: NCAT.                Right Ear: External ear normal.                 Left Ear: External ear normal.                Eyes: . Pupils are equal, round, and reactive to light. Conjunctivae and EOM are normal               Nose: without d/c or deformity               Neck: Neck supple. Gross normal ROM               Cardiovascular: Normal rate and regular rhythm.                 Pulmonary/Chest: Effort normal and breath sounds without rales or wheezing.                Abd:  Soft, NT, ND, + BS, no organomegaly               Neurological: Pt is alert. At baseline orientation, motor grossly intact               Skin: Skin is warm. No rashes, no other new lesions, LE edema - 1-2+ to knees bilat               Psychiatric: Pt  behavior is normal without agitation   Micro: none  Cardiac tracings I have personally interpreted today:  none  Pertinent Radiological findings (summarize): none   Lab Results  Component Value Date   WBC 4.3 09/07/2023   HGB 16.0 09/07/2023   HCT 46.2 09/07/2023   PLT 181 09/07/2023   GLUCOSE 437 (H) 09/07/2023    CHOL 187 04/04/2023   TRIG 177.0 (H) 04/04/2023   HDL 44.70 04/04/2023   LDLDIRECT 107.0 10/11/2021   LDLCALC 107 (H) 04/04/2023   ALT 16 09/07/2023   AST 23 09/07/2023   NA 135 09/07/2023   K 4.4 09/07/2023   CL 101 09/07/2023   CREATININE 1.64 (H) 09/07/2023   BUN 35 (H) 09/07/2023   CO2 27 09/07/2023   TSH 3.35 04/04/2023   PSA 1.79 04/04/2023   INR 4.1 (A) 11/09/2023   HGBA1C 7.0 (H) 04/04/2023   MICROALBUR 14.9 (H) 04/04/2023   Assessment/Plan:  Austin [name] is a 68 y.o. Black or African American [2] male with  has a past medical history of Anemia, Aortic valve prosthesis present, Back pain, CKD (chronic kidney disease), stage II, Coronary artery disease, Family history of melanoma, Hearing loss, Hyperlipidemia, Hypertension, Internal hemorrhoid, Kaposi sarcoma (HCC), Obesity, Pericardial effusion, Thoracic aortic aneurysm (HCC), and Thyroid disease.  Encounter for well adult exam with abnormal findings Age and sex appropriate education and counseling updated with regular exercise and diet Referrals for preventative services - pt to call for optho exam soon Immunizations addressed - none needed Smoking counseling  - none needed Evidence for depression or other mood disorder - none significant Most recent labs reviewed. I have personally reviewed and have noted: 1) the patient's medical and social history 2) The patient's current medications and supplements 3) The patient's height, weight, and BMI have been recorded in the chart   Hypothyroidism Lab Results  Component Value Date   TSH 3.35 04/04/2023   Stable, pt to continue without med tx for now,  to f/u any worsening symptoms or concerns   HLD (hyperlipidemia) Lab Results  Component Value Date   LDLCALC 107 (H) 04/04/2023   Uncontrolled but now taking repatha, pt to continue this and for f/u lab    Essential hypertension BP Readings from Last 3 Encounters:  11/12/23 120/80  09/07/23 122/74   09/07/23 125/76   Stable, pt to continue medical treatment losartan 25 mg every day, toprol xl 200 qd   CKD (chronic kidney disease), stage III (HCC) Lab Results  Component Value Date   CREATININE 1.64 (H) 09/07/2023   Stable overall, cont to avoid nephrotoxins   Diabetes (HCC) Lab Results  Component Value Date   HGBA1C 7.0 (H) 04/04/2023   Stable, pt to continue current medical treatment farxiga 10 mg qd   Peripheral edema Pt with some increased volume today, exam o/w benign, for change hct to lasix 40 mg every day, and f/u lab bmp in 2 wks  Followup: Return in about 6 months (around 05/13/2024).  Rosalia Colonel, MD 11/13/2023 7:58 PM [facility] Medicine

## 2023-11-12 NOTE — Progress Notes (Unsigned)
 Patient ID: Austin [name], male   DOB: [birth year], 68 y.o.   MRN: [id number]

## 2023-11-13 ENCOUNTER — Encounter: Payer: Self-pay | Admitting: Internal Medicine

## 2023-11-13 DIAGNOSIS — R600 Localized edema: Secondary | ICD-10-CM | POA: Insufficient documentation

## 2023-11-13 NOTE — Assessment & Plan Note (Signed)
 Pt with some increased volume today, exam o/w benign, for change hct to lasix 40 mg every day, and f/u lab bmp in 2 wks

## 2023-11-13 NOTE — Assessment & Plan Note (Signed)
 Lab Results  Component Value Date   CREATININE 1.64 (H) 09/07/2023   Stable overall, cont to avoid nephrotoxins

## 2023-11-13 NOTE — Assessment & Plan Note (Signed)
 Lab Results  Component Value Date   HGBA1C 7.0 (H) 04/04/2023   Stable, pt to continue current medical treatment farxiga 10 mg qd

## 2023-11-13 NOTE — Assessment & Plan Note (Signed)
 Age and sex appropriate education and counseling updated with regular exercise and diet Referrals for preventative services - pt to call for optho exam soon Immunizations addressed - none needed Smoking counseling  - none needed Evidence for depression or other mood disorder - none significant Most recent labs reviewed. I have personally reviewed and have noted: 1) the patient's medical and social history 2) The patient's current medications and supplements 3) The patient's height, weight, and BMI have been recorded in the chart

## 2023-11-13 NOTE — Assessment & Plan Note (Signed)
 BP Readings from Last 3 Encounters:  11/12/23 120/80  09/07/23 122/74  09/07/23 125/76   Stable, pt to continue medical treatment losartan 25 mg every day, toprol xl 200 qd

## 2023-11-13 NOTE — Assessment & Plan Note (Signed)
 Lab Results  Component Value Date   LDLCALC 107 (H) 04/04/2023   Uncontrolled but now taking repatha, pt to continue this and for f/u lab

## 2023-11-13 NOTE — Assessment & Plan Note (Signed)
 Lab Results  Component Value Date   TSH 3.35 04/04/2023   Stable, pt to continue without med tx for now,  to f/u any worsening symptoms or concerns

## 2023-11-23 ENCOUNTER — Ambulatory Visit

## 2023-11-23 DIAGNOSIS — Z7901 Long term (current) use of anticoagulants: Secondary | ICD-10-CM

## 2023-11-23 LAB — POCT INR: INR: 3.1 — AB (ref 2.0–3.0)

## 2023-11-23 NOTE — Patient Instructions (Addendum)
 Pre visit review using our clinic review tool, if applicable. No additional management support is needed unless otherwise documented below in the visit note.  Eat a salad tonight and then change weekly dose to take 1 tablet daily except take 1/2 tablet on Wednesday. Recheck in 3 weeks.

## 2023-11-23 NOTE — Progress Notes (Signed)
 Pt reports bilateral lower leg edema 2 weeks ago during INR check. He had apt with PCP on 4/14 and had change in diuretic, no interaction with warfarin. Pt reports he has not seen much improvement with the new medication yet but it has only been a week. Pt also reported before his coumadin  clinic apt two weeks ago he had received the COVID booster vaccine, which would explain why INR was supratherapeutic at that time.  Eat a salad tonight and then change weekly dose to take 1 tablet daily except take 1/2 tablet on Wednesday. Recheck in 3 weeks.

## 2023-11-30 ENCOUNTER — Other Ambulatory Visit (INDEPENDENT_AMBULATORY_CARE_PROVIDER_SITE_OTHER)

## 2023-11-30 DIAGNOSIS — Z125 Encounter for screening for malignant neoplasm of prostate: Secondary | ICD-10-CM

## 2023-11-30 DIAGNOSIS — E611 Iron deficiency: Secondary | ICD-10-CM

## 2023-11-30 DIAGNOSIS — E538 Deficiency of other specified B group vitamins: Secondary | ICD-10-CM

## 2023-11-30 DIAGNOSIS — E1165 Type 2 diabetes mellitus with hyperglycemia: Secondary | ICD-10-CM

## 2023-11-30 DIAGNOSIS — E559 Vitamin D deficiency, unspecified: Secondary | ICD-10-CM | POA: Diagnosis not present

## 2023-11-30 LAB — HEPATIC FUNCTION PANEL
ALT: 17 U/L (ref 0–53)
AST: 25 U/L (ref 0–37)
Albumin: 4.6 g/dL (ref 3.5–5.2)
Alkaline Phosphatase: 26 U/L — ABNORMAL LOW (ref 39–117)
Bilirubin, Direct: 0.2 mg/dL (ref 0.0–0.3)
Total Bilirubin: 0.8 mg/dL (ref 0.2–1.2)
Total Protein: 6.7 g/dL (ref 6.0–8.3)

## 2023-11-30 LAB — URINALYSIS, ROUTINE W REFLEX MICROSCOPIC
Bilirubin Urine: NEGATIVE
Hgb urine dipstick: NEGATIVE
Ketones, ur: NEGATIVE
Leukocytes,Ua: NEGATIVE
Nitrite: NEGATIVE
RBC / HPF: NONE SEEN (ref 0–?)
Specific Gravity, Urine: 1.01 (ref 1.000–1.030)
Total Protein, Urine: NEGATIVE
Urine Glucose: >=1000 — AB
Urobilinogen, UA: 0.2 (ref 0.0–1.0)
WBC, UA: NONE SEEN (ref 0–?)
pH: 6.5 (ref 5.0–8.0)

## 2023-11-30 LAB — BASIC METABOLIC PANEL WITH GFR
BUN: 39 mg/dL — ABNORMAL HIGH (ref 6–23)
CO2: 28 meq/L (ref 19–32)
Calcium: 9.1 mg/dL (ref 8.4–10.5)
Chloride: 102 meq/L (ref 96–112)
Creatinine, Ser: 1.8 mg/dL — ABNORMAL HIGH (ref 0.40–1.50)
GFR: 38.18 mL/min — ABNORMAL LOW (ref >60.00)
Glucose, Bld: 178 mg/dL — ABNORMAL HIGH (ref 70–99)
Potassium: 4 meq/L (ref 3.5–5.1)
Sodium: 138 meq/L (ref 135–145)

## 2023-11-30 LAB — IBC PANEL
Iron: 143 ug/dL (ref 42–165)
Saturation Ratios: 33.4 % (ref 20.0–50.0)
TIBC: 428.4 ug/dL (ref 250.0–450.0)
Transferrin: 306 mg/dL (ref 212.0–360.0)

## 2023-11-30 LAB — HEMOGLOBIN A1C: Hgb A1c MFr Bld: 8 % — ABNORMAL HIGH (ref 4.6–6.5)

## 2023-11-30 LAB — LIPID PANEL
Cholesterol: 86 mg/dL (ref 0–200)
HDL: 40.3 mg/dL (ref >39.00)
LDL Cholesterol: 7 mg/dL (ref 0–99)
NonHDL: 45.21
Total CHOL/HDL Ratio: 2
Triglycerides: 191 mg/dL — ABNORMAL HIGH (ref 0.0–149.0)
VLDL: 38.2 mg/dL (ref 0.0–40.0)

## 2023-11-30 LAB — PSA: PSA: 2.61 ng/mL (ref 0.10–4.00)

## 2023-11-30 LAB — MICROALBUMIN / CREATININE URINE RATIO
Creatinine,U: 46.7 mg/dL
Microalb Creat Ratio: 63 mg/g — ABNORMAL HIGH (ref 0.0–30.0)
Microalb, Ur: 2.9 mg/dL — ABNORMAL HIGH (ref 0.0–1.9)

## 2023-11-30 LAB — VITAMIN B12: Vitamin B-12: 654 pg/mL (ref 211–911)

## 2023-11-30 LAB — VITAMIN D 25 HYDROXY (VIT D DEFICIENCY, FRACTURES): VITD: 66.82 ng/mL (ref 30.00–100.00)

## 2023-11-30 LAB — FERRITIN: Ferritin: 599.4 ng/mL — ABNORMAL HIGH (ref 22.0–322.0)

## 2023-11-30 LAB — TSH: TSH: 4.08 u[IU]/mL (ref 0.35–5.50)

## 2023-12-02 ENCOUNTER — Other Ambulatory Visit: Payer: Self-pay | Admitting: Internal Medicine

## 2023-12-02 ENCOUNTER — Encounter: Payer: Self-pay | Admitting: Internal Medicine

## 2023-12-02 MED ORDER — METFORMIN HCL ER 500 MG PO TB24: 1000.0000 mg | ORAL_TABLET | Freq: Every day | ORAL | 3 refills | Status: AC

## 2023-12-03 ENCOUNTER — Encounter: Payer: Self-pay | Admitting: Hematology and Oncology

## 2023-12-03 LAB — CBC WITH DIFFERENTIAL/PLATELET
Basophils Absolute: 0 10*3/uL (ref 0.0–0.1)
Basophils Relative: 0.5 % (ref 0.0–3.0)
Eosinophils Absolute: 0.2 10*3/uL (ref 0.0–0.7)
Eosinophils Relative: 4.3 % (ref 0.0–5.0)
HCT: 46.7 % (ref 39.0–52.0)
Hemoglobin: 15.5 g/dL (ref 13.0–17.0)
Lymphocytes Relative: 20.3 % (ref 12.0–46.0)
Lymphs Abs: 1.1 10*3/uL (ref 0.7–4.0)
MCHC: 33.2 g/dL (ref 30.0–36.0)
MCV: 85.8 fl (ref 78.0–100.0)
Monocytes Absolute: 0.5 10*3/uL (ref 0.1–1.0)
Monocytes Relative: 8.9 % (ref 3.0–12.0)
Neutro Abs: 3.5 10*3/uL (ref 1.4–7.7)
Neutrophils Relative %: 66 % (ref 43.0–77.0)
Platelets: 198 10*3/uL (ref 150.0–400.0)
RBC: 5.45 Mil/uL (ref 4.22–5.81)
RDW: 13.5 % (ref 11.5–15.5)
WBC: 5.3 10*3/uL (ref 4.0–10.5)

## 2023-12-18 ENCOUNTER — Ambulatory Visit

## 2023-12-18 DIAGNOSIS — Z7901 Long term (current) use of anticoagulants: Secondary | ICD-10-CM

## 2023-12-18 LAB — POCT INR: INR: 2.4 (ref 2.0–3.0)

## 2023-12-18 NOTE — Patient Instructions (Addendum)
Pre visit review using our clinic review tool, if applicable. No additional management support is needed unless otherwise documented below in the visit note.  Continue 1 tablet daily except take 1/2 tablet on Wednesday. Recheck in 6 weeks.

## 2023-12-18 NOTE — Progress Notes (Signed)
 Continue 1 tablet daily except take 1/2 tablet on Wednesday. Recheck in 6 weeks.

## 2023-12-19 ENCOUNTER — Other Ambulatory Visit: Payer: Self-pay

## 2024-01-01 ENCOUNTER — Other Ambulatory Visit: Payer: Self-pay | Admitting: Internal Medicine

## 2024-01-01 DIAGNOSIS — Z7901 Long term (current) use of anticoagulants: Secondary | ICD-10-CM

## 2024-01-01 DIAGNOSIS — E1165 Type 2 diabetes mellitus with hyperglycemia: Secondary | ICD-10-CM

## 2024-01-01 NOTE — Telephone Encounter (Signed)
 Pt is compliant with warfarin management and PCP apts.  Sent in refill of warfarin to requested pharmacy.    Per PCP last note, Sept 2024, pt is to continue Farxiga , 10 mg. Sent in refill for this also.

## 2024-01-04 ENCOUNTER — Encounter: Payer: Self-pay | Admitting: Hematology and Oncology

## 2024-01-04 ENCOUNTER — Inpatient Hospital Stay: Payer: Medicare Other

## 2024-01-04 ENCOUNTER — Telehealth: Payer: Self-pay

## 2024-01-04 ENCOUNTER — Inpatient Hospital Stay: Payer: Medicare Other | Attending: Hematology and Oncology | Admitting: Hematology and Oncology

## 2024-01-04 VITALS — BP 111/73 | HR 71 | Temp 98.6°F | Resp 18 | Wt 205.6 lb

## 2024-01-04 VITALS — BP 116/67 | HR 57 | Resp 16

## 2024-01-04 DIAGNOSIS — Z7901 Long term (current) use of anticoagulants: Secondary | ICD-10-CM | POA: Insufficient documentation

## 2024-01-04 DIAGNOSIS — Z952 Presence of prosthetic heart valve: Secondary | ICD-10-CM

## 2024-01-04 DIAGNOSIS — R600 Localized edema: Secondary | ICD-10-CM | POA: Insufficient documentation

## 2024-01-04 DIAGNOSIS — C469 Kaposi's sarcoma, unspecified: Secondary | ICD-10-CM

## 2024-01-04 DIAGNOSIS — R972 Elevated prostate specific antigen [PSA]: Secondary | ICD-10-CM

## 2024-01-04 DIAGNOSIS — Z95828 Presence of other vascular implants and grafts: Secondary | ICD-10-CM

## 2024-01-04 DIAGNOSIS — Z5111 Encounter for antineoplastic chemotherapy: Secondary | ICD-10-CM | POA: Insufficient documentation

## 2024-01-04 DIAGNOSIS — N1831 Chronic kidney disease, stage 3a: Secondary | ICD-10-CM | POA: Diagnosis not present

## 2024-01-04 LAB — CBC WITH DIFFERENTIAL (CANCER CENTER ONLY)
Abs Immature Granulocytes: 0.01 10*3/uL (ref 0.00–0.07)
Basophils Absolute: 0 10*3/uL (ref 0.0–0.1)
Basophils Relative: 1 %
Eosinophils Absolute: 0.1 10*3/uL (ref 0.0–0.5)
Eosinophils Relative: 3 %
HCT: 40.5 % (ref 39.0–52.0)
Hemoglobin: 13.6 g/dL (ref 13.0–17.0)
Immature Granulocytes: 0 %
Lymphocytes Relative: 23 %
Lymphs Abs: 1 10*3/uL (ref 0.7–4.0)
MCH: 27.8 pg (ref 26.0–34.0)
MCHC: 33.6 g/dL (ref 30.0–36.0)
MCV: 82.7 fL (ref 80.0–100.0)
Monocytes Absolute: 0.5 10*3/uL (ref 0.1–1.0)
Monocytes Relative: 13 %
Neutro Abs: 2.6 10*3/uL (ref 1.7–7.7)
Neutrophils Relative %: 60 %
Platelet Count: 187 10*3/uL (ref 150–400)
RBC: 4.9 MIL/uL (ref 4.22–5.81)
RDW: 12.8 % (ref 11.5–15.5)
WBC Count: 4.2 10*3/uL (ref 4.0–10.5)
nRBC: 0 % (ref 0.0–0.2)

## 2024-01-04 LAB — CMP (CANCER CENTER ONLY)
ALT: 19 U/L (ref 0–44)
AST: 30 U/L (ref 15–41)
Albumin: 4.2 g/dL (ref 3.5–5.0)
Alkaline Phosphatase: 22 U/L — ABNORMAL LOW (ref 38–126)
Anion gap: 7 (ref 5–15)
BUN: 37 mg/dL — ABNORMAL HIGH (ref 8–23)
CO2: 25 mmol/L (ref 22–32)
Calcium: 8.9 mg/dL (ref 8.9–10.3)
Chloride: 107 mmol/L (ref 98–111)
Creatinine: 1.7 mg/dL — ABNORMAL HIGH (ref 0.61–1.24)
GFR, Estimated: 43 mL/min — ABNORMAL LOW (ref >60)
Glucose, Bld: 244 mg/dL — ABNORMAL HIGH (ref 70–99)
Potassium: 4.1 mmol/L (ref 3.5–5.1)
Sodium: 139 mmol/L (ref 135–145)
Total Bilirubin: 0.5 mg/dL (ref 0.0–1.2)
Total Protein: 6.8 g/dL (ref 6.5–8.1)

## 2024-01-04 MED ORDER — DEXTROSE 5 % IV SOLN: Freq: Once | INTRAVENOUS | Status: AC

## 2024-01-04 MED ORDER — DEXAMETHASONE SODIUM PHOSPHATE 10 MG/ML IJ SOLN
10.0000 mg | Freq: Once | INTRAMUSCULAR | Status: AC
  Administered 2024-01-04: 10 mg via INTRAVENOUS
  Filled 2024-01-04: qty 1

## 2024-01-04 MED ORDER — DOXORUBICIN HCL LIPOSOMAL CHEMO INJECTION 2 MG/ML
27.0000 mg/m2 | Freq: Once | INTRAVENOUS | Status: AC
  Administered 2024-01-04: 60 mg via INTRAVENOUS
  Filled 2024-01-04: qty 30

## 2024-01-04 MED ORDER — HEPARIN SOD (PORK) LOCK FLUSH 100 UNIT/ML IV SOLN
500.0000 [IU] | Freq: Once | INTRAVENOUS | Status: AC | PRN
  Administered 2024-01-04: 500 [IU]

## 2024-01-04 MED ORDER — SODIUM CHLORIDE 0.9% FLUSH
10.0000 mL | Freq: Once | INTRAVENOUS | Status: AC
  Administered 2024-01-04: 10 mL

## 2024-01-04 MED ORDER — SODIUM CHLORIDE 0.9% FLUSH
10.0000 mL | INTRAVENOUS | Status: DC | PRN
  Administered 2024-01-04: 10 mL

## 2024-01-04 NOTE — Assessment & Plan Note (Addendum)
 He is on chronic anticoagulation therapy Will monitor cardiac function with echocardiogram

## 2024-01-04 NOTE — Assessment & Plan Note (Addendum)
 I have reviewed recommendation from her prior physicians and specialist for treatment of recurrent sarcoma The patient that he has recurrent skin disease at approximately 3 months after each treatment In the past, he has been receiving treatment every 4 months We discussed risk and benefits of changing his treatment to every 3 months and he agreed Will proceed with treatment today Before he returns for next treatment in September, I will get echocardiogram to be done by his cardiologist to monitor for cardiac function The patient has chronic lower extremity edema but his cardiac exam is within normal limits

## 2024-01-04 NOTE — Progress Notes (Signed)
 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[facility]) I have reviewed recommendation from her prior physicians and specialist for treatment of recurrent sarcoma The patient that he has recurrent skin disease at approximately 3 months after each treatment In the past, he has been receiving treatment every 4 months We discussed risk and benefits of changing his treatment to every 3 months and he agreed Will proceed with treatment today Before he returns for next treatment in September, I will get echocardiogram to be done by his cardiologist to monitor for cardiac function The patient has chronic lower extremity edema but his cardiac exam is within normal limits S/P aortic valve replacement  Stage 3a chronic kidney disease (HCC) He is on multiple medications and under care of cardiologist Monitor closely He does not need dose adjustment Aortic valve prosthesis present He is on chronic anticoagulation therapy Will monitor cardiac function with echocardiogram  Orders Placed This Encounter  Procedures   CBC with Differential (Cancer Center Only)    Standing Status:   Future    Expected Date:   07/02/2024    Expiration Date:   07/02/2025   CMP (Cancer Center only)    Standing Status:   Future    Expected Date:   07/02/2024    Expiration Date:   07/02/2025   ECHOCARDIOGRAM COMPLETE    Standing Status:   Future    Expected Date:   01/18/2024    Expiration Date:   01/03/2025    Perflutren DEFINITY (image enhancing agent) should be administered unless hypersensitivity or allergy exist:   Administer Perflutren    Other Comments:   on docorubicin, check EF    Where should this test be performed:   [facility]    Reason for exam-Echo:   S/P Aortic Valve replacement Z95.2     Almeda Jacobs, MD  INTERVAL HISTORY: he returns for treatment  follow-up Complications related to previous cycle of chemotherapy included rash, and leg edema According to the patient, he has recurrent skin rash within 3 months after last treatment.  He has chronic bilateral lower extremity edema, stable No recent chest pain or shortness of breath  PHYSICAL EXAMINATION: ECOG PERFORMANCE STATUS: 1 - Symptomatic but completely ambulatory  No results found for: "CAN125"    Latest Ref Rng & Units 01/04/2024    8:06 AM 11/30/2023   10:53 AM 09/07/2023    8:20 AM  CBC  WBC 4.0 - 10.5 K/uL 4.2  5.3  4.3   Hemoglobin 13.0 - 17.0 g/dL 47.8  29.5  62.1   Hematocrit 39.0 - 52.0 % 40.5  46.7  46.2   Platelets 150 - 400 K/uL 187  198.0  181       Chemistry      Component Value Date/Time   NA 139 01/04/2024 0806   NA 142 07/03/2023 0951   NA 143 11/03/2016 0908   K 4.1 01/04/2024 0806   K 5.0 11/03/2016 0908   CL 107 01/04/2024 0806   CO2 25 01/04/2024 0806   CO2 27 11/03/2016 0908   BUN 37 (H) 01/04/2024 0806   BUN 29 (H) 07/03/2023 0951   BUN 33.5 (H) 11/03/2016 0908   CREATININE 1.70 (H) 01/04/2024 0806   CREATININE 1.5 (H) 11/03/2016 0908      Component Value Date/Time   CALCIUM  8.9 01/04/2024 0806  CALCIUM  9.5 11/03/2016 0908   ALKPHOS 22 (L) 01/04/2024 0806   ALKPHOS 25 (L) 11/03/2016 0908   AST 30 01/04/2024 0806   AST 29 11/03/2016 0908   ALT 19 01/04/2024 0806   ALT 29 11/03/2016 0908   BILITOT 0.5 01/04/2024 0806   BILITOT 0.70 11/03/2016 0908       Vitals:   01/04/24 0821  BP: 111/73  Pulse: 71  Resp: 18  Temp: 98.6 F (37 C)  SpO2: 100%   Filed Weights   01/04/24 0821  Weight: 205 lb 9.6 oz (93.3 kg)   Examination of his heart revealed regular rate and rhythm with presence of mechanical valvular click.  Normal breath sounds bilateral lungs Other relevant data reviewed during this visit included CBC and CMP

## 2024-01-04 NOTE — Telephone Encounter (Signed)
 Called and given appt for echo on 8/25 at 1015 arrive at 1000, requested Dr. Hattie Lints to read echo. Given address of [place] [address] [place] [address] and and phone # [phone].  He verbalized understanding.

## 2024-01-04 NOTE — Assessment & Plan Note (Addendum)
He is on multiple medications and under care of cardiologist Monitor closely He does not need dose adjustment

## 2024-01-04 NOTE — Patient Instructions (Signed)
 [facility] - A DEPT OF [facility]  Discharge Instructions: Thank you for choosing [facility] to provide your oncology and hematology care.   If you have a lab appointment with the Cancer Center, please go directly to the Cancer Center and check in at the registration area.   Wear comfortable clothing and clothing appropriate for easy access to any Portacath or PICC line.   We strive to give you quality time with your provider. You may need to reschedule your appointment if you arrive late (15 or more minutes).  Arriving late affects you and other patients whose appointments are after yours.  Also, if you miss three or more appointments without notifying the office, you may be dismissed from the clinic at the provider's discretion.      For prescription refill requests, have your pharmacy contact our office and allow 72 hours for refills to be completed.    Today you received the following chemotherapy and/or immunotherapy agents: Doxil      To help prevent nausea and vomiting after your treatment, we encourage you to take your nausea medication as directed.  BELOW ARE SYMPTOMS THAT SHOULD BE REPORTED IMMEDIATELY: *FEVER GREATER THAN 100.4 F (38 C) OR HIGHER *CHILLS OR SWEATING *NAUSEA AND VOMITING THAT IS NOT CONTROLLED WITH YOUR NAUSEA MEDICATION *UNUSUAL SHORTNESS OF BREATH *UNUSUAL BRUISING OR BLEEDING *URINARY PROBLEMS (pain or burning when urinating, or frequent urination) *BOWEL PROBLEMS (unusual diarrhea, constipation, pain near the anus) TENDERNESS IN MOUTH AND THROAT WITH OR WITHOUT PRESENCE OF ULCERS (sore throat, sores in mouth, or a toothache) UNUSUAL RASH, SWELLING OR PAIN  UNUSUAL VAGINAL DISCHARGE OR ITCHING   Items with * indicate a potential emergency and should be followed up as soon as possible or go to the Emergency Department if any problems should occur.  Please show the CHEMOTHERAPY ALERT CARD or IMMUNOTHERAPY  ALERT CARD at check-in to the Emergency Department and triage nurse.  Should you have questions after your visit or need to cancel or reschedule your appointment, please contact [facility] - A DEPT OF [facility]  Dept: [phone]  and follow the prompts.  Office hours are 8:00 a.m. to 4:30 p.m. Monday - Friday. Please note that voicemails left after 4:00 p.m. may not be returned until the following business day.  We are closed weekends and major holidays. You have access to a nurse at all times for urgent questions. Please call the main number to the clinic Dept: [phone] and follow the prompts.   For any non-urgent questions, you may also contact your provider using MyChart. We now offer e-Visits for anyone 29 and older to request care online for non-urgent symptoms. For details visit [email or web address].   Also download the MyChart app! Go to the app store, search "MyChart", open the app, select [facility], and log in with your MyChart username and password.

## 2024-01-06 ENCOUNTER — Other Ambulatory Visit: Payer: Self-pay

## 2024-01-07 ENCOUNTER — Encounter: Payer: Self-pay | Admitting: Internal Medicine

## 2024-01-07 ENCOUNTER — Other Ambulatory Visit: Payer: Self-pay

## 2024-01-07 MED ORDER — REPATHA SURECLICK 140 MG/ML ~~LOC~~ SOAJ: 140.0000 mg | SUBCUTANEOUS | 3 refills | Status: AC

## 2024-01-15 ENCOUNTER — Other Ambulatory Visit (INDEPENDENT_AMBULATORY_CARE_PROVIDER_SITE_OTHER)

## 2024-01-15 ENCOUNTER — Ambulatory Visit: Admitting: Orthopaedic Surgery

## 2024-01-15 DIAGNOSIS — M25562 Pain in left knee: Secondary | ICD-10-CM

## 2024-01-15 NOTE — Progress Notes (Signed)
 Office Visit Note   Patient: Austin [name]           Date of Birth: 11-May-1956           MRN: [id number] Visit Date: 01/15/2024              Requested by: Roslyn Coombe, MD [address] [place],  [place] [address] PCP: Roslyn Coombe, MD   Assessment & Plan: Visit Diagnoses:  1. Acute pain of left knee     Plan: History of Present Illness Austin [name] is a 68 year old male with a history of left knee surgery who presents with left knee pain and weakness.  He experiences left knee pain and weakness for about a month, with the knee feeling unstable and occasional pain rated as 4 out of 10. There is an occasional clicking sound. No recent injury to the knee is reported. He underwent surgery on the left knee approximately 20 years ago, likely involving cartilage work. He is more concerned about the weakness than the pain. He is retired and does not engage in activities that worsen symptoms. There is no locking of the knee. His medical history includes type 2 diabetes and previous chemotherapy for Kaposi's sarcoma. He takes furosemide  and warfarin, limiting NSAID use for pain management.  Physical Exam MUSCULOSKELETAL: No effusion in left knee. Excellent flexibility in left knee. No tenderness along left knee joint line.  Results RADIOLOGY Left knee X-ray: No fractures, mild osteoarthritis  Assessment and Plan Chondromalacia of left knee Chondromalacia with occasional pain, weakness, and instability. X-rays show no structural abnormalities. NSAIDs contraindicated due to warfarin. Steroid injections not recommended initially due to diabetes. - Refer to outpatient physical therapy for knee strengthening exercises. - Advise home exercises to strengthen muscles around the knee. - Recommend acetaminophen  for pain management.  Takes warfarin. - Consider knee injection if symptoms do not improve with physical therapy.  Type 2 Diabetes Mellitus Type 2 diabetes managed with  medication. Advised against steroid injections due to potential impact on blood glucose.  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 HIV negative, etiology unclear.  Follow-Up Instructions: No follow-ups on file.   Orders:  Orders Placed This Encounter  Procedures   XR KNEE 3 VIEW LEFT   Ambulatory referral to Physical Therapy   No orders of the defined types were placed in this encounter.     Procedures: No procedures performed   Clinical Data: No additional findings.   Subjective: Chief Complaint  Patient presents with   Left Knee - Pain    HPI  Review of Systems  Constitutional: Negative.   HENT: Negative.    Eyes: Negative.   Respiratory: Negative.    Cardiovascular: Negative.   Gastrointestinal: Negative.   Endocrine: Negative.   Genitourinary: Negative.   Skin: Negative.   Allergic/Immunologic: Negative.   Neurological: Negative.   Hematological: Negative.   Psychiatric/Behavioral: Negative.    All other systems reviewed and are negative.    Objective: Vital Signs: There were no vitals taken for this visit.  Physical Exam Vitals and nursing note reviewed.  Constitutional:      Appearance: He is well-developed.  HENT:     Head: Normocephalic and atraumatic.   Eyes:     Pupils: Pupils are equal, round, and reactive to light.   Pulmonary:     Effort: Pulmonary effort is normal.  Abdominal:     Palpations: Abdomen is soft.   Musculoskeletal:  General: Normal range of motion.     Cervical back: Neck supple.   Skin:    General: Skin is warm.   Neurological:     Mental Status: He is alert and oriented to person, place, and time.   Psychiatric:        Behavior: Behavior normal.        Thought Content: Thought content normal.        Judgment: Judgment normal.     Ortho Exam  Specialty Comments:  No specialty comments available.  Imaging: XR KNEE 3 VIEW LEFT Result Date: 01/15/2024 X-rays of  the left knee show mild osteoarthritis with peritubular spurring.    PMFS History: Patient Active Problem List   Diagnosis Date Noted   Peripheral edema 11/13/2023   Port-A-Cath in place 12/02/2020   Exposure to COVID-19 virus 02/28/2019   Febrile illness 02/28/2019   AAA (abdominal aortic aneurysm) without rupture (HCC) 02/04/2018   Increased prostate specific antigen (PSA) velocity 09/07/2017   Long term (current) use of anticoagulants 04/11/2017   Family history of melanoma    Hearing loss    Pronation deformity of both feet 03/28/2016   Diabetes (HCC) 10/04/2015   Kaposi's sarcoma, skin (HCC) 08/12/2015   Kaposi sarcoma (HCC) 02/03/2015   Plica syndrome of left knee 10/02/2014   Right shoulder pain 09/23/2014   Left knee pain 09/23/2014   Rash and nonspecific skin eruption 09/23/2014   Cochlear implant follow-up 07/09/2014   Goals of care, counseling/discussion 09/24/2013   Aortic valve prosthesis present    Thoracic aortic aneurysm (HCC)    Left-sided sensorineural hearing loss 12/27/2012   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   Chronic anticoagulation 09/16/2012   History of anticoagulant therapy 09/16/2012   CKD (chronic kidney disease), stage III (HCC) 05/16/2011   Encounter for well adult exam with abnormal findings 05/14/2011   CAROTID BRUIT 10/14/2010   S/P aortic valve replacement 09/20/2010   LUMBAR RADICULOPATHY, RIGHT 08/12/2009   ILIAC ARTERY ANEURYSM 04/02/2009   Unspecified Iron Deficiency Anemia 10/29/2007   EROSIVE GASTRITIS 05/03/2007   Hypothyroidism 03/05/2007   HLD (hyperlipidemia) 03/05/2007   OBESITY 03/05/2007   Essential hypertension 03/05/2007   Coronary atherosclerosis 03/05/2007   Disease of pericardium 03/05/2007   Internal hemorrhoids 03/05/2007   LOW BACK PAIN 03/05/2007   Tendinopathy of rotator cuff 03/05/2007   Past Medical History:  Diagnosis Date   Anemia    iron def   Aortic valve prosthesis present    a. SJM - [facility] 2003,  chronic coumadin .   Back pain    CKD (chronic kidney disease), stage II    Coronary artery disease    not sure where this dx came from - pt denies   Family history of melanoma    Hearing loss    Hyperlipidemia    Hypertension    Internal hemorrhoid    Kaposi sarcoma (HCC)    Obesity    Pericardial effusion    Thoracic aortic aneurysm (HCC)    a. s/p dissection and repair @ [facility] 2003 with SJM AVR with residual findings followed by CT.   Thyroid  disease     Family History  Problem Relation Age of Onset   Stroke Mother        late 8s   Stroke Father 71   Liver disease Sister        Sclerosing cholegitis necessitating a liver transplant   Melanoma Maternal Uncle        dx in his 81s  Heart disease Paternal Aunt    Other Maternal Grandfather        died under the age of 15 due to a health related issue   Colon cancer Neg Hx     Past Surgical History:  Procedure Laterality Date   ABDOMINAL AORTIC ANEURYSM REPAIR     AORTIC VALVE REPLACEMENT     impingment     right shoulder   IR IMAGING GUIDED PORT INSERTION  09/17/2020   KNEE ARTHROSCOPY     left   WISDOM TOOTH EXTRACTION     Social History   Occupational History   Occupation: Dietitian: [employer]  Tobacco Use   Smoking status: Former    Current packs/day: 0.00    Average packs/day: 1 pack/day for 15.0 years (15.0 ttl pk-yrs)    Types: Cigarettes    Start date: 10/11/1986    Quit date: 10/10/2001    Years since quitting: 22.2   Smokeless tobacco: Never  Substance and Sexual Activity   Alcohol use: Yes    Comment: one drink per day   Drug use: No   Sexual activity: Not Currently

## 2024-01-21 ENCOUNTER — Other Ambulatory Visit: Payer: Self-pay

## 2024-01-26 ENCOUNTER — Other Ambulatory Visit: Payer: Self-pay

## 2024-01-29 ENCOUNTER — Ambulatory Visit

## 2024-01-29 DIAGNOSIS — Z7901 Long term (current) use of anticoagulants: Secondary | ICD-10-CM | POA: Diagnosis not present

## 2024-01-29 LAB — POCT INR: INR: 3.8 — AB (ref 2.0–3.0)

## 2024-01-29 NOTE — Patient Instructions (Addendum)
 Pre visit review using our clinic review tool, if applicable. No additional management support is needed unless otherwise documented below in the visit note.  Hold warfarin tomorrow and then change weekly dose to take 1 tablet daily except take 1/2 tablet on Wednesday and Saturday. Recheck in 3 weeks.

## 2024-01-29 NOTE — Progress Notes (Signed)
 Pt reports being out of town for the last few weeks and diet was not consistent. Pt already took warfarin today.  Hold warfarin tomorrow and then change weekly dose to take 1 tablet daily except take 1/2 tablet on Wednesday and Saturday. Recheck in 3 weeks.

## 2024-02-04 ENCOUNTER — Ambulatory Visit (INDEPENDENT_AMBULATORY_CARE_PROVIDER_SITE_OTHER): Admitting: Physical Therapy

## 2024-02-04 ENCOUNTER — Encounter: Payer: Self-pay | Admitting: Physical Therapy

## 2024-02-04 DIAGNOSIS — M25562 Pain in left knee: Secondary | ICD-10-CM

## 2024-02-04 DIAGNOSIS — R29898 Other symptoms and signs involving the musculoskeletal system: Secondary | ICD-10-CM

## 2024-02-04 DIAGNOSIS — M6281 Muscle weakness (generalized): Secondary | ICD-10-CM | POA: Diagnosis not present

## 2024-02-04 NOTE — Therapy (Signed)
 OUTPATIENT PHYSICAL THERAPY EVALUATION   Patient Name: Austin [name] MRN: [id number] DOB:1956-03-06, 68 y.o., male Today's Date: 02/04/2024  END OF SESSION:  PT End of Session - 02/04/24 1004     Visit Number 1    Number of Visits 8    Date for PT Re-Evaluation 03/31/24    Authorization Type [employer] Medicare    Progress Note Due on Visit 10    PT Start Time 0932    PT Stop Time 1003    PT Time Calculation (min) 31 min    Activity Tolerance Patient tolerated treatment well    Behavior During Therapy WFL for tasks assessed/performed          Past Medical History:  Diagnosis Date   Anemia    iron def   Aortic valve prosthesis present    a. SJM - [facility] 2003, chronic coumadin .   Back pain    CKD (chronic kidney disease), stage II    Coronary artery disease    not sure where this dx came from - pt denies   Family history of melanoma    Hearing loss    Hyperlipidemia    Hypertension    Internal hemorrhoid    Kaposi sarcoma (HCC)    Obesity    Pericardial effusion    Thoracic aortic aneurysm (HCC)    a. s/p dissection and repair @ [facility] 2003 with SJM AVR with residual findings followed by CT.   Thyroid  disease    Past Surgical History:  Procedure Laterality Date   ABDOMINAL AORTIC ANEURYSM REPAIR     AORTIC VALVE REPLACEMENT     impingment     right shoulder   IR IMAGING GUIDED PORT INSERTION  09/17/2020   KNEE ARTHROSCOPY     left   WISDOM TOOTH EXTRACTION     Patient Active Problem List   Diagnosis Date Noted   Peripheral edema 11/13/2023   Port-A-Cath in place 12/02/2020   Exposure to COVID-19 virus 02/28/2019   Febrile illness 02/28/2019   AAA (abdominal aortic aneurysm) without rupture (HCC) 02/04/2018   Increased prostate specific antigen (PSA) velocity 09/07/2017   Long term (current) use of anticoagulants 04/11/2017   Family history of melanoma    Hearing loss    Pronation deformity of both feet 03/28/2016   Diabetes (HCC) 10/04/2015   Kaposi's  sarcoma, skin (HCC) 08/12/2015   Kaposi sarcoma (HCC) 02/03/2015   Plica syndrome of left knee 10/02/2014   Right shoulder pain 09/23/2014   Left knee pain 09/23/2014   Rash and nonspecific skin eruption 09/23/2014   Cochlear implant follow-up 07/09/2014   Goals of care, counseling/discussion 09/24/2013   Aortic valve prosthesis present    Thoracic aortic aneurysm (HCC)    Left-sided sensorineural hearing loss 12/27/2012   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   Chronic anticoagulation 09/16/2012   History of anticoagulant therapy 09/16/2012   CKD (chronic kidney disease), stage III (HCC) 05/16/2011   Encounter for well adult exam with abnormal findings 05/14/2011   CAROTID BRUIT 10/14/2010   S/P aortic valve replacement 09/20/2010   LUMBAR RADICULOPATHY, RIGHT 08/12/2009   ILIAC ARTERY ANEURYSM 04/02/2009   Unspecified Iron Deficiency Anemia 10/29/2007   EROSIVE GASTRITIS 05/03/2007   Hypothyroidism 03/05/2007   HLD (hyperlipidemia) 03/05/2007   OBESITY 03/05/2007   Essential hypertension 03/05/2007   Coronary atherosclerosis 03/05/2007   Disease of pericardium 03/05/2007   Internal hemorrhoids 03/05/2007   LOW BACK PAIN 03/05/2007   Tendinopathy of rotator cuff 03/05/2007  PCP: Norleen Lynwood ORN, MD  REFERRING PROVIDER: Jerri Kay HERO, MD  REFERRING DIAG: [phone] (ICD-10-CM) - Acute pain of left knee  Rationale for Evaluation and Treatment: Rehabilitation  THERAPY DIAG:  Muscle weakness (generalized) - Plan: PT plan of care cert/re-cert  Other symptoms and signs involving the musculoskeletal system - Plan: PT plan of care cert/re-cert  Acute pain of left knee - Plan: PT plan of care cert/re-cert  ONSET DATE: Chronic, worse x 2 months   SUBJECTIVE:                                                                                                                                                                                           SUBJECTIVE STATEMENT: Pt reports Lt knee  pain for a couple of months without known injury.  He reports Lt knee is more weak than truly pain with occasional episodes of near buckling.    PERTINENT HISTORY:  anemia, aortic valve replacement, CKD, HTN, Kaposi Sarcoma, TAA s/p dissection and repair, DM  PAIN:  Are you having pain? No  PRECAUTIONS:  Other: currently undergoing maintenance for sarcoma  RED FLAGS: None   WEIGHT BEARING RESTRICTIONS:  No  FALLS:  Has patient fallen in last 6 months? No  LIVING ENVIRONMENT: Lives with: 75# dog Lives in: House/apartment Stairs: split level home, 10 stairs to enter home Has following equipment at home: None  OCCUPATION:  Retired Programme researcher, broadcasting/film/video at [employer]  PLOF:  Independent and Leisure: travel, play with dog, movies, reading, walking for exercise   PATIENT GOALS:  Improve strength in LLE   OBJECTIVE:  Note: Objective measures were completed at Evaluation unless otherwise noted.  DIAGNOSTIC FINDINGS:  X-rays: mild OA  PATIENT SURVEYS:  Patient-Specific Activity Scoring Scheme  0 represents "unable to perform." 10 represents "able to perform at prior level. 0 1 2 3 4 5 6 7 8 9  10 (Date and Score)   Activity Eval     1. Ascend/descend stairs 5    2. Walking distances  7    3. Lifting weights 6   Score 6    Total score = sum of the activity scores/number of activities Minimum detectable change (90%CI) for average score = 2 points Minimum detectable change (90%CI) for single activity score = 3 points    COGNITIVE STATUS: Within functional limits for tasks assessed   SENSATION: WFL  POSTURE:  No Significant postural limitations  GAIT: 02/04/24 Comments: amb independently without significant deviations   LOWER EXTREMITY ROM:    Bil knee ROM WNL  LOWER EXTREMITY MMT:    MMT Right eval Left eval  Hip flexion  Hip extension 5/5 4/5  Hip abduction    Hip adduction 4/5 4/5  Hip internal rotation    Hip external rotation    Knee flexion  5/5 5/5  Knee extension 54.2# 54.45#  Ankle dorsiflexion    Ankle plantarflexion    Ankle inversion    Ankle eversion     (Blank rows = not tested)    TREATMENT:                                                                                                                              DATE:  02/04/24 TherEx See HEP - demonstrated with trial reps performed PRN, mod cues for comprehension    Self Care Educated on clinical findings and PT POC     PATIENT EDUCATION:  Education details: HEP Person educated: Patient Education method: Programmer, multimedia, Facilities manager, and Handouts Education comprehension: verbalized understanding, returned demonstration, and needs further education  HOME EXERCISE PROGRAM: Access Code: [email or web address] URL: [email or web address] Date: 02/04/2024 Prepared by: Corean Ku  Exercises - Seated Straight Leg Raise  - 2 x daily - 7 x weekly - 1-2 sets - 10 reps - 2-3 sec hold - Sitting Knee Extension with Resistance  - 2 x daily - 7 x weekly - 1-2 sets - 10 reps - 5 sec hold - Sidelying Hip Circles  - 2 x daily - 7 x weekly - 1-2 sets - 10 circles each way - Supine Bridge  - 2 x daily - 7 x weekly - 1-2 sets - 10 reps - 5 sec hold - Figure 4 Bridge  - 2 x daily - 7 x weekly - 1-2 sets - 10 reps   ASSESSMENT:  CLINICAL IMPRESSION: Patient is a 68 y.o. male who was seen today for physical therapy evaluation and treatment for chronic Lt knee pain. He demonstrates mild strength deficits affecting functional mobility.  He will benefit from PT to address deficits listed.     OBJECTIVE IMPAIRMENTS: decreased endurance, difficulty walking, and decreased strength.   ACTIVITY LIMITATIONS: carrying, lifting, standing, squatting, stairs, transfers, and locomotion level  PARTICIPATION LIMITATIONS: meal prep, cleaning, laundry, driving, shopping, community activity, and yard work  PERSONAL FACTORS: Past/current experiences, Time since onset of  injury/illness/exacerbation, and 3+ comorbidities: anemia, aortic valve replacement, CKD, HTN, Kaposi Sarcoma, TAA s/p dissection and repair, DM are also affecting patient's functional outcome.   REHAB POTENTIAL: Good  CLINICAL DECISION MAKING: Stable/uncomplicated  EVALUATION COMPLEXITY: Low   GOALS: Goals reviewed with patient? Yes  SHORT TERM GOALS: Target date: 03/03/2024  Independent with initial HEP Goal status: INITIAL  2.  Bil knee ext strength improved to at least 65# for improved strength Goal status: INITIAL   LONG TERM GOALS: Target date: 03/31/2024   Independent with final HEP Goal status: INITIAL  2.  PSFS score improved by 2 points Goal status: INITIAL  3.  Bil knee ext strength improved to at least 70# for improved strength Goal status: INIITAL  PLAN:  PT FREQUENCY: 1x/week (anticipate 1 visit every 2-3 weeks but will see up to 1x/wk PRN)  PT DURATION: 8 weeks  PLANNED INTERVENTIONS: 97164- PT Re-evaluation, 97750- Physical Performance Testing, 97110-Therapeutic exercises, 97530- Therapeutic activity, [id number]- Neuromuscular re-education, 97535- Self Care, 02859- Manual therapy, [id number]- Gait training, [phone]- Aquatic Therapy, [phone]- Vasopneumatic device, [phone] (1-2 muscles), 20561 (3+ muscles)- Dry Needling, Patient/Family education, Balance training, Stair training, Taping, Cryotherapy, and Moist heat.  PLAN FOR NEXT SESSION: Review HEP, no estim or U/S (active cancer), quad and hip strengthening   NEXT MD VISIT: PRN    Corean JULIANNA Ku, PT, DPT 02/04/24 11:26 AM    Date of referral: 01/15/24 Referring provider: Jerri Kay HERO, MD  Referring diagnosis? M25.562 (ICD-10-CM) - Acute pain of left knee Treatment diagnosis? (if different than referring diagnosis) M62.81, R29.898, M25.562  What was this (referring dx) caused by? Unspecified  Nature of Condition: Initial Onset (within last 3 months)   Laterality: Lt  Current Functional Measure  Score: Patient Specific Functional Scale 6  Objective measurements identify impairments when they are compared to normal values, the uninvolved extremity, and prior level of function.  []  Yes  []  No  Objective assessment of functional ability: Minimal functional limitations   Briefly describe symptoms: Left knee weakness, mild discomfort, episodes of buckling  How did symptoms start: insidious  Average pain intensity:  Last 24 hours: 0  Past week: 0  How often does the pt experience symptoms? Occasionally  How much have the symptoms interfered with usual daily activities? A little bit  How has condition changed since care began at this facility? NA - initial visit  In general, how is the patients overall health? Fair   BACK PAIN (STarT Back Screening Tool) No

## 2024-02-09 ENCOUNTER — Other Ambulatory Visit: Payer: Self-pay

## 2024-02-12 ENCOUNTER — Other Ambulatory Visit: Payer: Self-pay

## 2024-02-19 ENCOUNTER — Ambulatory Visit

## 2024-02-19 DIAGNOSIS — Z7901 Long term (current) use of anticoagulants: Secondary | ICD-10-CM

## 2024-02-19 LAB — POCT INR: INR: 3.3 — AB (ref 2.0–3.0)

## 2024-02-19 NOTE — Patient Instructions (Addendum)
 Pre visit review using our clinic review tool, if applicable. No additional management support is needed unless otherwise documented below in the visit note.  Hold warfarin tomorrow and then change weekly dose to take 1 tablet daily except take 1/2 tablet on Monday, Wednesday and Friday. Recheck in 2 weeks.

## 2024-02-19 NOTE — Progress Notes (Signed)
 Pt out of town last 2 weeks and diet has not been consistent. Pt reports he has had increased lower right leg edema for the last several months. This will increase INR and has for the last two INR readings. Pt reports he was prescribed a new diuretic and has started that yesterday. Advised pt if the edema improves to contact the coumadin  clinic so INR can be checked sooner. Pt verbalized understanding. Pt already took warfarin today.  Hold warfarin tomorrow and then change weekly dose to take 1 tablet daily except take 1/2 tablet on Monday, Wednesday and Friday. Recheck in 2 weeks.

## 2024-02-25 ENCOUNTER — Ambulatory Visit: Admitting: Physical Therapy

## 2024-02-25 ENCOUNTER — Encounter: Payer: Self-pay | Admitting: Physical Therapy

## 2024-02-25 DIAGNOSIS — M6281 Muscle weakness (generalized): Secondary | ICD-10-CM | POA: Diagnosis not present

## 2024-02-25 DIAGNOSIS — R29898 Other symptoms and signs involving the musculoskeletal system: Secondary | ICD-10-CM | POA: Diagnosis not present

## 2024-02-25 DIAGNOSIS — M25562 Pain in left knee: Secondary | ICD-10-CM

## 2024-02-25 NOTE — Therapy (Signed)
 OUTPATIENT PHYSICAL THERAPY TREATMENT   Patient Name: Austin [name] MRN: [id number] DOB:09-Jan-1956, 68 y.o., male Today's Date: 02/25/2024  END OF SESSION:  PT End of Session - 02/25/24 1437     Visit Number 2    Number of Visits 8    Date for PT Re-Evaluation 03/31/24    Authorization Type [employer] Medicare    Progress Note Due on Visit 10    PT Start Time 1430    PT Stop Time 1510    PT Time Calculation (min) 40 min    Activity Tolerance Patient tolerated treatment well    Behavior During Therapy WFL for tasks assessed/performed           Past Medical History:  Diagnosis Date   Anemia    iron def   Aortic valve prosthesis present    a. SJM - [facility] 2003, chronic coumadin .   Back pain    CKD (chronic kidney disease), stage II    Coronary artery disease    not sure where this dx came from - pt denies   Family history of melanoma    Hearing loss    Hyperlipidemia    Hypertension    Internal hemorrhoid    Kaposi sarcoma (HCC)    Obesity    Pericardial effusion    Thoracic aortic aneurysm (HCC)    a. s/p dissection and repair @ [facility] 2003 with SJM AVR with residual findings followed by CT.   Thyroid  disease    Past Surgical History:  Procedure Laterality Date   ABDOMINAL AORTIC ANEURYSM REPAIR     AORTIC VALVE REPLACEMENT     impingment     right shoulder   IR IMAGING GUIDED PORT INSERTION  09/17/2020   KNEE ARTHROSCOPY     left   WISDOM TOOTH EXTRACTION     Patient Active Problem List   Diagnosis Date Noted   Peripheral edema 11/13/2023   Port-A-Cath in place 12/02/2020   Exposure to COVID-19 virus 02/28/2019   Febrile illness 02/28/2019   AAA (abdominal aortic aneurysm) without rupture (HCC) 02/04/2018   Increased prostate specific antigen (PSA) velocity 09/07/2017   Long term (current) use of anticoagulants 04/11/2017   Family history of melanoma    Hearing loss    Pronation deformity of both feet 03/28/2016   Diabetes (HCC) 10/04/2015   Kaposi's  sarcoma, skin (HCC) 08/12/2015   Kaposi sarcoma (HCC) 02/03/2015   Plica syndrome of left knee 10/02/2014   Right shoulder pain 09/23/2014   Left knee pain 09/23/2014   Rash and nonspecific skin eruption 09/23/2014   Cochlear implant follow-up 07/09/2014   Goals of care, counseling/discussion 09/24/2013   Aortic valve prosthesis present    Thoracic aortic aneurysm (HCC)    Left-sided sensorineural hearing loss 12/27/2012   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   Chronic anticoagulation 09/16/2012   History of anticoagulant therapy 09/16/2012   CKD (chronic kidney disease), stage III (HCC) 05/16/2011   Encounter for well adult exam with abnormal findings 05/14/2011   CAROTID BRUIT 10/14/2010   S/P aortic valve replacement 09/20/2010   LUMBAR RADICULOPATHY, RIGHT 08/12/2009   ILIAC ARTERY ANEURYSM 04/02/2009   Unspecified Iron Deficiency Anemia 10/29/2007   EROSIVE GASTRITIS 05/03/2007   Hypothyroidism 03/05/2007   HLD (hyperlipidemia) 03/05/2007   OBESITY 03/05/2007   Essential hypertension 03/05/2007   Coronary atherosclerosis 03/05/2007   Disease of pericardium 03/05/2007   Internal hemorrhoids 03/05/2007   LOW BACK PAIN 03/05/2007   Tendinopathy of rotator cuff 03/05/2007  PCP: Norleen Lynwood ORN, MD  REFERRING PROVIDER: Jerri Kay HERO, MD  REFERRING DIAG: [phone] (ICD-10-CM) - Acute pain of left knee  Rationale for Evaluation and Treatment: Rehabilitation  THERAPY DIAG:  Muscle weakness (generalized)  Other symptoms and signs involving the musculoskeletal system  Acute pain of left knee  ONSET DATE: Chronic, worse x 2 months   SUBJECTIVE:                                                                                                                                                                                           SUBJECTIVE STATEMENT: Knees are about the same. Doing exercises almost every day.  PERTINENT HISTORY:  anemia, aortic valve replacement, CKD,  HTN, Kaposi Sarcoma, TAA s/p dissection and repair, DM  PAIN:  Are you having pain? No  PRECAUTIONS:  Other: currently undergoing maintenance for sarcoma  RED FLAGS: None   WEIGHT BEARING RESTRICTIONS:  No  FALLS:  Has patient fallen in last 6 months? No  LIVING ENVIRONMENT: Lives with: 75# dog Lives in: House/apartment Stairs: split level home, 10 stairs to enter home Has following equipment at home: None  OCCUPATION:  Retired Programme researcher, broadcasting/film/video at [employer]  PLOF:  Independent and Leisure: travel, play with dog, movies, reading, walking for exercise   PATIENT GOALS:  Improve strength in LLE   OBJECTIVE:  Note: Objective measures were completed at Evaluation unless otherwise noted.  DIAGNOSTIC FINDINGS:  X-rays: mild OA  PATIENT SURVEYS:  Patient-Specific Activity Scoring Scheme  0 represents "unable to perform." 10 represents "able to perform at prior level. 0 1 2 3 4 5 6 7 8 9  10 (Date and Score)   Activity Eval     1. Ascend/descend stairs 5    2. Walking distances  7    3. Lifting weights 6   Score 6    Total score = sum of the activity scores/number of activities Minimum detectable change (90%CI) for average score = 2 points Minimum detectable change (90%CI) for single activity score = 3 points    COGNITIVE STATUS: Within functional limits for tasks assessed   SENSATION: WFL  POSTURE:  No Significant postural limitations  GAIT: 02/04/24 Comments: amb independently without significant deviations   LOWER EXTREMITY ROM:    Bil knee ROM WNL  LOWER EXTREMITY MMT:    MMT Right eval Left eval  Hip flexion    Hip extension 5/5 4/5  Hip abduction    Hip adduction 4/5 4/5  Hip internal rotation    Hip external rotation    Knee flexion 5/5 5/5  Knee extension 54.2# 54.45#  Ankle dorsiflexion  Ankle plantarflexion    Ankle inversion    Ankle eversion     (Blank rows = not tested)    TREATMENT:                                                                                                                               DATE:  02/25/24 TherEx Recumbent bike L5 x 7 min Seated SLR x 10 reps bil Seated LAQ/contralateral hamstring curl L4 band x 10 bil Bridges x 10 reps; 5 sec ohld Knee extension machine single limb 15#x5 reps; then 10# x10 reps bil Hamstring curl machine 15# single limb x10 reps bil (could do 20-25#)  TherAct TRX squats 2x10 TRX lateral lunges x 10 bil; alternating   02/04/24 TherEx See HEP - demonstrated with trial reps performed PRN, mod cues for comprehension    Self Care Educated on clinical findings and PT POC     PATIENT EDUCATION:  Education details: HEP Person educated: Patient Education method: Programmer, multimedia, Facilities manager, and Handouts Education comprehension: verbalized understanding, returned demonstration, and needs further education  HOME EXERCISE PROGRAM: Access Code: [email or web address] URL: [email or web address] Date: 02/04/2024 Prepared by: Corean Ku  Exercises - Seated Straight Leg Raise  - 2 x daily - 7 x weekly - 1-2 sets - 10 reps - 2-3 sec hold - Sitting Knee Extension with Resistance  - 2 x daily - 7 x weekly - 1-2 sets - 10 reps - 5 sec hold - Sidelying Hip Circles  - 2 x daily - 7 x weekly - 1-2 sets - 10 circles each way - Supine Bridge  - 2 x daily - 7 x weekly - 1-2 sets - 10 reps - 5 sec hold - Figure 4 Bridge  - 2 x daily - 7 x weekly - 1-2 sets - 10 reps   ASSESSMENT:  CLINICAL IMPRESSION: Tolerated session well today, did try some gym equipment and encouraged him to look at membership.  Pt wants to trial exercise at home, and will call if he needs to return.     OBJECTIVE IMPAIRMENTS: decreased endurance, difficulty walking, and decreased strength.   ACTIVITY LIMITATIONS: carrying, lifting, standing, squatting, stairs, transfers, and locomotion level  PARTICIPATION LIMITATIONS: meal prep, cleaning, laundry, driving, shopping, community  activity, and yard work  PERSONAL FACTORS: Past/current experiences, Time since onset of injury/illness/exacerbation, and 3+ comorbidities: anemia, aortic valve replacement, CKD, HTN, Kaposi Sarcoma, TAA s/p dissection and repair, DM are also affecting patient's functional outcome.   REHAB POTENTIAL: Good  CLINICAL DECISION MAKING: Stable/uncomplicated  EVALUATION COMPLEXITY: Low   GOALS: Goals reviewed with patient? Yes  SHORT TERM GOALS: Target date: 03/03/2024  Independent with initial HEP Goal status: INITIAL  2.  Bil knee ext strength improved to at least 65# for improved strength Goal status: INITIAL   LONG TERM GOALS: Target date: 03/31/2024   Independent with final HEP Goal status: INITIAL  2.  PSFS score improved by 2 points Goal status:  INITIAL  3.  Bil knee ext strength improved to at least 70# for improved strength Goal status: INIITAL    PLAN:  PT FREQUENCY: 1x/week (anticipate 1 visit every 2-3 weeks but will see up to 1x/wk PRN)  PT DURATION: 8 weeks  PLANNED INTERVENTIONS: 97164- PT Re-evaluation, 97750- Physical Performance Testing, 97110-Therapeutic exercises, 97530- Therapeutic activity, [id number]- Neuromuscular re-education, 97535- Self Care, 02859- Manual therapy, [id number]- Gait training, [phone]- Aquatic Therapy, 97016- Vasopneumatic device, [phone] (1-2 muscles), 20561 (3+ muscles)- Dry Needling, Patient/Family education, Balance training, Stair training, Taping, Cryotherapy, and Moist heat.  PLAN FOR NEXT SESSION: goal assessment, no estim or U/S (active cancer), quad and hip strengthening   NEXT MD VISIT: PRN    Corean JULIANNA Ku, PT, DPT 02/25/24 3:14 PM    Date of referral: 01/15/24 Referring provider: Jerri Kay HERO, MD  Referring diagnosis? M25.562 (ICD-10-CM) - Acute pain of left knee Treatment diagnosis? (if different than referring diagnosis) M62.81, R29.898, M25.562  What was this (referring dx) caused by? Unspecified  Nature of  Condition: Initial Onset (within last 3 months)   Laterality: Lt  Current Functional Measure Score: Patient Specific Functional Scale 6  Objective measurements identify impairments when they are compared to normal values, the uninvolved extremity, and prior level of function.  []  Yes  []  No  Objective assessment of functional ability: Minimal functional limitations   Briefly describe symptoms: Left knee weakness, mild discomfort, episodes of buckling  How did symptoms start: insidious  Average pain intensity:  Last 24 hours: 0  Past week: 0  How often does the pt experience symptoms? Occasionally  How much have the symptoms interfered with usual daily activities? A little bit  How has condition changed since care began at this facility? NA - initial visit  In general, how is the patients overall health? Fair   BACK PAIN (STarT Back Screening Tool) No

## 2024-03-04 ENCOUNTER — Ambulatory Visit

## 2024-03-04 DIAGNOSIS — Z7901 Long term (current) use of anticoagulants: Secondary | ICD-10-CM

## 2024-03-04 LAB — POCT INR: INR: 2.9 (ref 2.0–3.0)

## 2024-03-04 NOTE — Progress Notes (Signed)
Continue 1 tablet daily except take 1/2 tablet on Monday, Wednesday and Friday. Recheck in 5 weeks.

## 2024-03-04 NOTE — Patient Instructions (Addendum)
 Pre visit review using our clinic review tool, if applicable. No additional management support is needed unless otherwise documented below in the visit note.  Continue 1 tablet daily except take 1/2 tablet on Monday, Wednesday and Friday. Recheck in  5 weeks.

## 2024-03-05 ENCOUNTER — Ambulatory Visit (INDEPENDENT_AMBULATORY_CARE_PROVIDER_SITE_OTHER)

## 2024-03-05 ENCOUNTER — Other Ambulatory Visit: Payer: Self-pay

## 2024-03-05 VITALS — BP 108/62 | HR 61 | Ht 66.0 in | Wt 194.6 lb

## 2024-03-05 DIAGNOSIS — Z1211 Encounter for screening for malignant neoplasm of colon: Secondary | ICD-10-CM | POA: Diagnosis not present

## 2024-03-05 DIAGNOSIS — Z0000 Encounter for general adult medical examination without abnormal findings: Secondary | ICD-10-CM | POA: Diagnosis not present

## 2024-03-05 NOTE — Patient Instructions (Signed)
 Austin [name] , Thank you for taking time out of your busy schedule to complete your Annual Wellness Visit with me. I enjoyed our conversation and look forward to speaking with you again next year. I, as well as your care team,  appreciate your ongoing commitment to your health goals. Please review the following plan we discussed and let me know if I can assist you in the future. Your Game plan/ To Do List    Referrals: If you haven't heard from the office you've been referred to, please reach out to them at the phone provided. Ordered a Cologuard kit  Follow up Visits: We will see or speak with you next year for your Next Medicare AWV with our clinical staff Have you seen your provider in the last 6 months (3 months if uncontrolled diabetes)? Yes  Clinician Recommendations:  Aim for 30 minutes of exercise or brisk walking, 6-8 glasses of water, and 5 servings of fruits and vegetables each day.       This is a list of the screenings recommended for you:  Health Maintenance  Topic Date Due   Eye exam for diabetics  10/13/2023   Cologuard (Stool DNA test)  11/27/2023   Flu Shot  02/29/2024   Hemoglobin A1C  06/01/2024   Complete foot exam   11/11/2024   Yearly kidney health urinalysis for diabetes  11/29/2024   Yearly kidney function blood test for diabetes  01/03/2025   Medicare Annual Wellness Visit  03/05/2025   DTaP/Tdap/Td vaccine (3 - Td or Tdap) 09/16/2029   Pneumococcal Vaccine for age over 15  Completed   Hepatitis C Screening  Completed   Zoster (Shingles) Vaccine  Completed   Hepatitis B Vaccine  Aged Out   HPV Vaccine  Aged Out   Meningitis B Vaccine  Aged Out   COVID-19 Vaccine  Discontinued    Advanced directives: (Provided) Advance directive discussed with you today. I have provided a copy for you to complete at home and have notarized. Once this is complete, please bring a copy in to our office so we can scan it into your chart.  Advance Care Planning is important  because it:  [x]  Makes sure you receive the medical care that is consistent with your values, goals, and preferences  [x]  It provides guidance to your family and loved ones and reduces their decisional burden about whether or not they are making the right decisions based on your wishes.  Follow the link provided in your after visit summary or read over the paperwork we have mailed to you to help you started getting your Advance Directives in place. If you need assistance in completing these, please reach out to us  so that we can help you!

## 2024-03-05 NOTE — Progress Notes (Signed)
 Subjective:   Austin [name] is a 68 y.o. who presents for a Medicare Wellness preventive visit.  As a reminder, Annual Wellness Visits don't include a physical exam, and some assessments may be limited, especially if this visit is performed virtually. We may recommend an in-person follow-up visit with your provider if needed.  Visit Complete: In person  Persons Participating in Visit: Patient.  AWV Questionnaire: Yes: Patient Medicare AWV questionnaire was completed by the patient on 03/03/2024; I have confirmed that all information answered by patient is correct and no changes since this date.  Cardiac Risk Factors include: advanced age (>83men, >37 women);diabetes mellitus;dyslipidemia;hypertension;male gender;obesity (BMI >30kg/m2)     Objective:    Today's Vitals   03/05/24 0930  BP: 108/62  Pulse: 61  SpO2: 98%  Weight: 194 lb 9.6 oz (88.3 kg)  Height: 5' 6 (1.676 m)   Body mass index is 31.41 kg/m.     03/05/2024    9:30 AM 02/04/2024    9:34 AM 01/27/2021    1:10 PM 12/02/2020    2:24 PM 10/08/2020    2:05 PM 09/17/2020    8:41 AM 09/13/2020    9:33 AM  Advanced Directives  Does Patient Have a Medical Advance Directive? No No No  No No No  Would patient like information on creating a medical advance directive? Yes (MAU/Ambulatory/Procedural Areas - Information given) No - Patient declined No - Patient declined No - Patient declined No - Patient declined No - Patient declined No - Patient declined    Current Medications (verified) Outpatient Encounter Medications as of 03/05/2024  Medication Sig   amoxicillin  (AMOXIL ) 500 MG capsule Take 500 mg by mouth 3 (three) times daily. Take 4 capsules 4hrs prior to dental work   Evolocumab  (REPATHA  SURECLICK) 140 MG/ML SOAJ Inject 140 mg into the skin every 14 (fourteen) days.   FARXIGA  10 MG TABS tablet TAKE 1 TABLET BY MOUTH DAILY  BEFORE BREAKFAST   fenofibrate  160 MG tablet Take 1 tablet (160 mg total) by mouth daily.    glucose blood (ONETOUCH VERIO) test strip Use to check blood sugars daily   hydrochlorothiazide  (HYDRODIURIL ) 12.5 MG tablet Take 12.5 mg by mouth daily.   Lancets (ONETOUCH ULTRASOFT) lancets Use as instructed   lidocaine -prilocaine  (EMLA ) cream Apply 1 application topically as needed.   losartan  (COZAAR ) 25 MG tablet Take 1 tablet (25 mg total) by mouth daily.   metFORMIN  (GLUCOPHAGE -XR) 500 MG 24 hr tablet Take 2 tablets (1,000 mg total) by mouth daily with breakfast.   metoprolol  (TOPROL -XL) 200 MG 24 hr tablet TAKE 1 TABLET DAILY   Multiple Vitamin (MULTIVITAMIN) tablet Take 1 tablet by mouth daily.   rosuvastatin  (CRESTOR ) 40 MG tablet TAKE 1 TABLET DAILY   warfarin (COUMADIN ) 5 MG tablet TAKE 1 TABLET BY MOUTH DAILY  EXCEPT TAKE 1/2 TABLET BY MOUTH ON WEDNESDAYS OR AS  DIRECTED BY ANTICOAGULATION  CLINIC   furosemide  (LASIX ) 40 MG tablet Take 1 tablet (40 mg total) by mouth daily. (Patient not taking: Reported on 03/05/2024)   No facility-administered encounter medications on file as of 03/05/2024.    Allergies (verified) Celecoxib   History: Past Medical History:  Diagnosis Date   Anemia    iron def   Aortic valve prosthesis present    a. SJM - [facility] 2003, chronic coumadin .   Back pain    CKD (chronic kidney disease), stage II    Coronary artery disease    not sure where this dx  came from - pt denies   Family history of melanoma    Hearing loss    Hyperlipidemia    Hypertension    Internal hemorrhoid    Kaposi sarcoma (HCC)    Obesity    Pericardial effusion    Thoracic aortic aneurysm (HCC)    a. s/p dissection and repair @ [facility] 2003 with SJM AVR with residual findings followed by CT.   Thyroid  disease    Past Surgical History:  Procedure Laterality Date   ABDOMINAL AORTIC ANEURYSM REPAIR     AORTIC VALVE REPLACEMENT     impingment     right shoulder   IR IMAGING GUIDED PORT INSERTION  09/17/2020   KNEE ARTHROSCOPY     left   WISDOM TOOTH EXTRACTION     Family  History  Problem Relation Age of Onset   Stroke Mother        late 23s   Stroke Father 67   Liver disease Sister        Sclerosing cholegitis necessitating a liver transplant   Melanoma Maternal Uncle        dx in his 45s   Heart disease Paternal Aunt    Other Maternal Grandfather        died under the age of 23 due to a health related issue   Colon cancer Neg Hx    Social History   Socioeconomic History   Marital status: Single    Spouse name: Not on file   Number of children: 0   Years of education: MDIV   Highest education level: Master's degree (e.g., MA, MS, MEng, MEd, MSW, MBA)  Occupational History   Occupation: Dietitian: [employer]  Tobacco Use   Smoking status: Former    Current packs/day: 0.00    Average packs/day: 1 pack/day for 15.0 years (15.0 ttl pk-yrs)    Types: Cigarettes    Start date: 10/11/1986    Quit date: 10/10/2001    Years since quitting: 22.4   Smokeless tobacco: Never  Substance and Sexual Activity   Alcohol use: Yes    Comment: one drink per day   Drug use: No   Sexual activity: Yes  Other Topics Concern   Not on file  Social History Narrative   Pt lives at home alone.   Caffeine Use: Less than 1 cup daily.   Social Drivers of Corporate investment banker Strain: Low Risk  (03/05/2024)   Overall Financial Resource Strain (CARDIA)    Difficulty of Paying Living Expenses: Not hard at all  Food Insecurity: No Food Insecurity (03/05/2024)   Hunger Vital Sign    Worried About Running Out of Food in the Last Year: Never true    Ran Out of Food in the Last Year: Never true  Transportation Needs: No Transportation Needs (03/05/2024)   PRAPARE - Administrator, Civil Service (Medical): No    Lack of Transportation (Non-Medical): No  Physical Activity: Insufficiently Active (03/05/2024)   Exercise Vital Sign    Days of Exercise per Week: 3 days    Minutes of Exercise per Session: 20 min  Stress: No Stress  Concern Present (03/05/2024)   [employer] of Occupational Health - Occupational Stress Questionnaire    Feeling of Stress: Not at all  Social Connections: Moderately Integrated (03/05/2024)   Social Connection and Isolation Panel    Frequency of Communication with Friends and Family: More than three times a week  Frequency of Social Gatherings with Friends and Family: Twice a week    Attends Religious Services: More than 4 times per year    Active Member of [employer] or Organizations: Yes    Attends Engineer, structural: More than 4 times per year    Marital Status: Never married    Tobacco Counseling Counseling given: No    Clinical Intake:  Pre-visit preparation completed: Yes  Pain : No/denies pain     BMI - recorded: 31.41 Nutritional Status: BMI > 30  Obese Nutritional Risks: None Diabetes: Yes CBG done?: No Did pt. bring in CBG monitor from home?: No  Lab Results  Component Value Date   HGBA1C 8.0 (H) 11/30/2023   HGBA1C 7.0 (H) 04/04/2023   HGBA1C 5.9 (A) 04/14/2022     How often do you need to have someone help you when you read instructions, pamphlets, or other written materials from your doctor or pharmacy?: 1 - Never  Interpreter Needed?: No  Information entered by :: Verdie Saba, CMA   Activities of Daily Living     03/05/2024    9:33 AM 03/03/2024   11:44 AM  In your present state of health, do you have any difficulty performing the following activities:  Hearing? 1 1  Comment wears left ear hearing aid   Vision? 0 0  Walking or climbing stairs? 0 0  Dressing or bathing? 0 0  Doing errands, shopping? 0 0  Preparing Food and eating ? N N  Using the Toilet? N N  In the past six months, have you accidently leaked urine? N N  Do you have problems with loss of bowel control? N N  Managing your Medications? N N  Managing your Finances? N N  Housekeeping or managing your Housekeeping? N N    Patient Care Team: Norleen Lynwood ORN, MD as PCP  - General Verlin Lonni BIRCH, MD as PCP - Cardiology (Cardiology)  I have updated your Care Teams any recent Medical Services you may have received from other providers in the past year.     Assessment:   This is a routine wellness examination for Austin [name].  Hearing/Vision screen Hearing Screening - Comments:: Wears 1 left ear hearing aid Vision Screening - Comments:: Wears rx glasses - plans to schedule an appt w/[facility]   Goals Addressed               This Visit's Progress     Patient Stated (pt-stated)        Patient stated he plans to continue staying busy and exercising       Depression Screen     03/05/2024    9:34 AM 11/12/2023    2:00 PM 04/04/2023    1:11 PM 04/14/2022   11:13 AM 10/11/2021    4:14 PM 10/28/2020   12:20 PM 10/28/2020   12:09 PM  PHQ 2/9 Scores  PHQ - 2 Score 0 0 0  0 0 0  PHQ- 9 Score 0        Exception Documentation    Patient refusal       Fall Risk     03/05/2024    9:33 AM 03/03/2024   11:44 AM 11/12/2023    2:07 PM 04/04/2023    1:11 PM 04/14/2022   11:13 AM  Fall Risk   Falls in the past year? 0 0 0 1 0  Number falls in past yr: 0 0 0 0   Injury with Fall? 0  0 0 0 0  Risk for fall due to : No Fall Risks  No Fall Risks History of fall(s)   Follow up Falls evaluation completed;Falls prevention discussed  Falls evaluation completed Falls evaluation completed Falls evaluation completed      Data saved with a previous flowsheet row definition    MEDICARE RISK AT HOME:  Medicare Risk at Home Any stairs in or around the home?: Yes If so, are there any without handrails?: No Home free of loose throw rugs in walkways, pet beds, electrical cords, etc?: No Adequate lighting in your home to reduce risk of falls?: Yes Life alert?: Yes Use of a cane, walker or w/c?: No Grab bars in the bathroom?: No Shower chair or bench in shower?: No Elevated toilet seat or a handicapped toilet?: No  TIMED UP AND GO:  Was the test performed?   No  Cognitive Function: 6CIT completed        03/05/2024    9:35 AM  6CIT Screen  What Year? 0 points  What month? 0 points  What time? 0 points  Count back from 20 0 points  Months in reverse 0 points  Repeat phrase 0 points  Total Score 0 points    Immunizations Immunization History  Administered Date(s) Administered   Fluad Trivalent(High Dose 65+) 04/04/2023   Influenza Split 05/15/2011, 07/29/2012   Influenza Whole 07/19/2005, 04/30/2006, 08/12/2009   Influenza,inj,Quad PF,6+ Mos 05/13/2013, 07/01/2014, 05/21/2015, 04/21/2016, 04/11/2017, 05/02/2018, 04/22/2019   Influenza-Unspecified 05/03/2020   PFIZER Comirnaty(Gray Top)Covid-19 Tri-Sucrose Vaccine 11/02/2020   PFIZER(Purple Top)SARS-COV-2 Vaccination 10/13/2019, 11/03/2019, 05/03/2020, 04/18/2021   Pneumococcal Conjugate-13 07/03/2013   Pneumococcal Polysaccharide-23 03/07/2006, 10/11/2021   Td 03/30/2009   Tdap 09/17/2019   Zoster Recombinant(Shingrix) 05/01/2022, 07/03/2022    Screening Tests Health Maintenance  Topic Date Due   OPHTHALMOLOGY EXAM  10/13/2023   Fecal DNA (Cologuard)  11/27/2023   INFLUENZA VACCINE  02/29/2024   HEMOGLOBIN A1C  06/01/2024   FOOT EXAM  11/11/2024   Diabetic kidney evaluation - Urine ACR  11/29/2024   Diabetic kidney evaluation - eGFR measurement  01/03/2025   Medicare Annual Wellness (AWV)  03/05/2025   DTaP/Tdap/Td (3 - Td or Tdap) 09/16/2029   Pneumococcal Vaccine: 50+ Years  Completed   Hepatitis C Screening  Completed   Zoster Vaccines- Shingrix  Completed   Hepatitis B Vaccines  Aged Out   HPV VACCINES  Aged Out   Meningococcal B Vaccine  Aged Out   COVID-19 Vaccine  Discontinued    Health Maintenance  Health Maintenance Due  Topic Date Due   OPHTHALMOLOGY EXAM  10/13/2023   Fecal DNA (Cologuard)  11/27/2023   INFLUENZA VACCINE  02/29/2024   Health Maintenance Items Addressed:  Cologuard Ordered today  Additional Screening:  Vision Screening:  Recommended annual ophthalmology exams for early detection of glaucoma and other disorders of the eye. Would you like a referral to an eye doctor? No  Patient plans to schedule an appt w/[facility] in 2025 for a Diabetic eye exam.  Dental Screening: Recommended annual dental exams for proper oral hygiene  Community Resource Referral / Chronic Care Management: CRR required this visit?  No   CCM required this visit?  No   Plan:    I have personally reviewed and noted the following in the patient's chart:   Medical and social history Use of alcohol, tobacco or illicit drugs  Current medications and supplements including opioid prescriptions. Patient is not currently taking opioid prescriptions. Functional ability  and status Nutritional status Physical activity Advanced directives List of other physicians Hospitalizations, surgeries, and ER visits in previous 12 months Vitals Screenings to include cognitive, depression, and falls Referrals and appointments  In addition, I have reviewed and discussed with patient certain preventive protocols, quality metrics, and best practice recommendations. A written personalized care plan for preventive services as well as general preventive health recommendations were provided to patient.   Verdie CHRISTELLA Saba, CMA   03/05/2024   After Visit Summary: Pt declined AVS due to in-person visit. Will review info via MyChart.  Notes: Nothing significant to report at this time.

## 2024-03-06 ENCOUNTER — Other Ambulatory Visit: Payer: Self-pay

## 2024-03-17 ENCOUNTER — Ambulatory Visit: Payer: Self-pay | Admitting: Internal Medicine

## 2024-03-17 LAB — COLOGUARD: COLOGUARD: NEGATIVE

## 2024-03-24 ENCOUNTER — Ambulatory Visit (HOSPITAL_COMMUNITY)
Admission: RE | Admit: 2024-03-24 | Discharge: 2024-03-24 | Disposition: A | Discharge status: Home / Self Care | Source: Ambulatory Visit | Attending: Cardiology | Admitting: Cardiology

## 2024-03-24 DIAGNOSIS — C469 Kaposi's sarcoma, unspecified: Secondary | ICD-10-CM | POA: Insufficient documentation

## 2024-03-24 DIAGNOSIS — Z952 Presence of prosthetic heart valve: Secondary | ICD-10-CM | POA: Diagnosis not present

## 2024-03-24 LAB — ECHOCARDIOGRAM COMPLETE
AR max vel: 1.17 cm2
AV Area VTI: 1.24 cm2
AV Area mean vel: 1.22 cm2
AV Mean grad: 17 mmHg
AV Peak grad: 34.1 mmHg
Ao pk vel: 2.92 m/s
Area-P 1/2: 3.74 cm2
S' Lateral: 3 cm

## 2024-03-31 ENCOUNTER — Other Ambulatory Visit: Payer: Self-pay | Admitting: Internal Medicine

## 2024-04-03 ENCOUNTER — Other Ambulatory Visit

## 2024-04-03 ENCOUNTER — Inpatient Hospital Stay

## 2024-04-03 ENCOUNTER — Encounter: Payer: Self-pay | Admitting: Hematology and Oncology

## 2024-04-03 ENCOUNTER — Ambulatory Visit

## 2024-04-03 ENCOUNTER — Ambulatory Visit: Admitting: Hematology and Oncology

## 2024-04-03 ENCOUNTER — Inpatient Hospital Stay: Attending: Hematology and Oncology

## 2024-04-03 ENCOUNTER — Inpatient Hospital Stay (HOSPITAL_BASED_OUTPATIENT_CLINIC_OR_DEPARTMENT_OTHER): Admitting: Hematology and Oncology

## 2024-04-03 VITALS — BP 124/64 | HR 56 | Temp 98.5°F | Resp 18 | Ht 66.0 in | Wt 197.4 lb

## 2024-04-03 DIAGNOSIS — N1831 Chronic kidney disease, stage 3a: Secondary | ICD-10-CM

## 2024-04-03 DIAGNOSIS — Z5111 Encounter for antineoplastic chemotherapy: Secondary | ICD-10-CM | POA: Diagnosis present

## 2024-04-03 DIAGNOSIS — C469 Kaposi's sarcoma, unspecified: Secondary | ICD-10-CM

## 2024-04-03 DIAGNOSIS — Z7901 Long term (current) use of anticoagulants: Secondary | ICD-10-CM | POA: Insufficient documentation

## 2024-04-03 LAB — CMP (CANCER CENTER ONLY)
ALT: 35 U/L (ref 0–44)
AST: 36 U/L (ref 15–41)
Albumin: 4.2 g/dL (ref 3.5–5.0)
Alkaline Phosphatase: 23 U/L — ABNORMAL LOW (ref 38–126)
Anion gap: 3 — ABNORMAL LOW (ref 5–15)
BUN: 29 mg/dL — ABNORMAL HIGH (ref 8–23)
CO2: 31 mmol/L (ref 22–32)
Calcium: 9.4 mg/dL (ref 8.9–10.3)
Chloride: 105 mmol/L (ref 98–111)
Creatinine: 1.6 mg/dL — ABNORMAL HIGH (ref 0.61–1.24)
GFR, Estimated: 47 mL/min — ABNORMAL LOW (ref >60)
Glucose, Bld: 84 mg/dL (ref 70–99)
Potassium: 4.4 mmol/L (ref 3.5–5.1)
Sodium: 139 mmol/L (ref 135–145)
Total Bilirubin: 0.6 mg/dL (ref 0.0–1.2)
Total Protein: 7 g/dL (ref 6.5–8.1)

## 2024-04-03 LAB — CBC WITH DIFFERENTIAL (CANCER CENTER ONLY)
Abs Immature Granulocytes: 0.01 K/uL (ref 0.00–0.07)
Basophils Absolute: 0 K/uL (ref 0.0–0.1)
Basophils Relative: 1 %
Eosinophils Absolute: 0.1 K/uL (ref 0.0–0.5)
Eosinophils Relative: 3 %
HCT: 41.8 % (ref 39.0–52.0)
Hemoglobin: 14.2 g/dL (ref 13.0–17.0)
Immature Granulocytes: 0 %
Lymphocytes Relative: 19 %
Lymphs Abs: 1 K/uL (ref 0.7–4.0)
MCH: 28.4 pg (ref 26.0–34.0)
MCHC: 34 g/dL (ref 30.0–36.0)
MCV: 83.6 fL (ref 80.0–100.0)
Monocytes Absolute: 0.5 K/uL (ref 0.1–1.0)
Monocytes Relative: 10 %
Neutro Abs: 3.6 K/uL (ref 1.7–7.7)
Neutrophils Relative %: 67 %
Platelet Count: 186 K/uL (ref 150–400)
RBC: 5 MIL/uL (ref 4.22–5.81)
RDW: 13.1 % (ref 11.5–15.5)
WBC Count: 5.3 K/uL (ref 4.0–10.5)
nRBC: 0 % (ref 0.0–0.2)

## 2024-04-03 MED ORDER — DEXTROSE 5 % IV SOLN: Freq: Once | INTRAVENOUS | Status: AC

## 2024-04-03 MED ORDER — DEXAMETHASONE SODIUM PHOSPHATE 10 MG/ML IJ SOLN
10.0000 mg | Freq: Once | INTRAMUSCULAR | Status: AC
  Administered 2024-04-03: 10 mg via INTRAVENOUS
  Filled 2024-04-03: qty 1

## 2024-04-03 MED ORDER — DOXORUBICIN HCL LIPOSOMAL CHEMO INJECTION 2 MG/ML
27.0000 mg/m2 | Freq: Once | INTRAVENOUS | Status: AC
  Administered 2024-04-03: 60 mg via INTRAVENOUS
  Filled 2024-04-03: qty 30

## 2024-04-03 NOTE — Progress Notes (Signed)
 [facility] OFFICE PROGRESS NOTE  Patient Care Team: Norleen Lynwood ORN, MD as PCP - Diedre Cash, Lonni BIRCH, MD as PCP - Cardiology (Cardiology)  Assessment & Plan Kaposi sarcoma [facility]) 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, well-controlled with liposomal doxorubicin  since 2019 The patient that he has recurrent skin disease at approximately 3 months after each treatment Originally, liposomal doxorubicin  was given every 4 months, most recently changed to every 3 months and that was able to control his disease Recent echocardiogram from August 2025 shows stable heart function He will proceed with treatment without delay I plan to monitor cardiac function once a year, next due August 2026 Stage 3a chronic kidney disease (HCC) He is on multiple medications and under care of cardiologist Monitor closely He does not need dose adjustment Chronic anticoagulation He is doing well and is under strict guidance from cardiology office He denies recent bleeding complications  Orders Placed This Encounter  Procedures   CBC with Differential (Cancer Center Only)    Standing Status:   Future    Expected Date:   10/01/2024    Expiration Date:   10/01/2025   CMP (Cancer Center only)    Standing Status:   Future    Expected Date:   10/01/2024    Expiration Date:   10/01/2025     Almarie Bedford, MD  INTERVAL HISTORY: he returns for treatment follow-up Complications related to previous cycle of chemotherapy included none Overall, he tolerated treatment well His skin lesions are stable  PHYSICAL EXAMINATION: ECOG PERFORMANCE STATUS: 0 - Asymptomatic  No results found for: [id number]    Latest Ref Rng & Units 04/03/2024   11:47 AM 01/04/2024    8:06 AM 11/30/2023   10:53 AM  CBC  WBC 4.0 - 10.5 K/uL 5.3  4.2  5.3   Hemoglobin 13.0 - 17.0 g/dL 85.7  86.3  84.4    Hematocrit 39.0 - 52.0 % 41.8  40.5  46.7   Platelets 150 - 400 K/uL 186  187  198.0       Chemistry      Component Value Date/Time   NA 139 04/03/2024 1147   NA 142 07/03/2023 0951   NA 143 11/03/2016 0908   K 4.4 04/03/2024 1147   K 5.0 11/03/2016 0908   CL 105 04/03/2024 1147   CO2 31 04/03/2024 1147   CO2 27 11/03/2016 0908   BUN 29 (H) 04/03/2024 1147   BUN 29 (H) 07/03/2023 0951   BUN 33.5 (H) 11/03/2016 0908   CREATININE 1.60 (H) 04/03/2024 1147   CREATININE 1.5 (H) 11/03/2016 0908      Component Value Date/Time   CALCIUM  9.4 04/03/2024 1147   CALCIUM  9.5 11/03/2016 0908   ALKPHOS 23 (L) 04/03/2024 1147   ALKPHOS 25 (L) 11/03/2016 0908   AST 36 04/03/2024 1147   AST 29 11/03/2016 0908   ALT 35 04/03/2024 1147   ALT 29 11/03/2016 0908   BILITOT 0.6 04/03/2024 1147   BILITOT 0.70 11/03/2016 0908       Vitals:   04/03/24 1216  BP: 124/64  Pulse: (!) 56  Resp: 18  Temp: 98.5 F (36.9 C)  SpO2: 99%   Filed Weights   04/03/24 1216  Weight: 197 lb 6.4 oz (89.5 kg)   Other relevant data reviewed during this visit included CBC, CMP, echocardiogram

## 2024-04-03 NOTE — Assessment & Plan Note (Addendum)
He is on multiple medications and under care of cardiologist Monitor closely He does not need dose adjustment

## 2024-04-03 NOTE — Assessment & Plan Note (Addendum)
 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, well-controlled with liposomal doxorubicin  since 2019 The patient that he has recurrent skin disease at approximately 3 months after each treatment Originally, liposomal doxorubicin  was given every 4 months, most recently changed to every 3 months and that was able to control his disease Recent echocardiogram from August 2025 shows stable heart function He will proceed with treatment without delay I plan to monitor cardiac function once a year, next due August 2026

## 2024-04-03 NOTE — Patient Instructions (Signed)
 [facility] - A DEPT OF [facility]  Discharge Instructions: Thank you for choosing [facility] to provide your oncology and hematology care.   If you have a lab appointment with the Cancer Center, please go directly to the Cancer Center and check in at the registration area.   Wear comfortable clothing and clothing appropriate for easy access to any Portacath or PICC line.   We strive to give you quality time with your provider. You may need to reschedule your appointment if you arrive late (15 or more minutes).  Arriving late affects you and other patients whose appointments are after yours.  Also, if you miss three or more appointments without notifying the office, you may be dismissed from the clinic at the provider's discretion.      For prescription refill requests, have your pharmacy contact our office and allow 72 hours for refills to be completed.    Today you received the following chemotherapy and/or immunotherapy agents: doxorubicin  liposomal      To help prevent nausea and vomiting after your treatment, we encourage you to take your nausea medication as directed.  BELOW ARE SYMPTOMS THAT SHOULD BE REPORTED IMMEDIATELY: *FEVER GREATER THAN 100.4 F (38 C) OR HIGHER *CHILLS OR SWEATING *NAUSEA AND VOMITING THAT IS NOT CONTROLLED WITH YOUR NAUSEA MEDICATION *UNUSUAL SHORTNESS OF BREATH *UNUSUAL BRUISING OR BLEEDING *URINARY PROBLEMS (pain or burning when urinating, or frequent urination) *BOWEL PROBLEMS (unusual diarrhea, constipation, pain near the anus) TENDERNESS IN MOUTH AND THROAT WITH OR WITHOUT PRESENCE OF ULCERS (sore throat, sores in mouth, or a toothache) UNUSUAL RASH, SWELLING OR PAIN  UNUSUAL VAGINAL DISCHARGE OR ITCHING   Items with * indicate a potential emergency and should be followed up as soon as possible or go to the Emergency Department if any problems should occur.  Please show the CHEMOTHERAPY ALERT CARD or  IMMUNOTHERAPY ALERT CARD at check-in to the Emergency Department and triage nurse.  Should you have questions after your visit or need to cancel or reschedule your appointment, please contact [facility] - A DEPT OF [facility]  Dept: [phone]  and follow the prompts.  Office hours are 8:00 a.m. to 4:30 p.m. Monday - Friday. Please note that voicemails left after 4:00 p.m. may not be returned until the following business day.  We are closed weekends and major holidays. You have access to a nurse at all times for urgent questions. Please call the main number to the clinic Dept: [phone] and follow the prompts.   For any non-urgent questions, you may also contact your provider using MyChart. We now offer e-Visits for anyone 66 and older to request care online for non-urgent symptoms. For details visit [email or web address].   Also download the MyChart app! Go to the app store, search MyChart, open the app, select [facility], and log in with your MyChart username and password.

## 2024-04-03 NOTE — Assessment & Plan Note (Addendum)
He is doing well and is under strict guidance from cardiology office He denies recent bleeding complications

## 2024-04-04 ENCOUNTER — Other Ambulatory Visit: Payer: Self-pay

## 2024-04-09 ENCOUNTER — Encounter: Payer: Self-pay | Admitting: Hematology and Oncology

## 2024-04-11 ENCOUNTER — Encounter: Payer: Self-pay | Admitting: Internal Medicine

## 2024-04-11 ENCOUNTER — Ambulatory Visit

## 2024-04-11 ENCOUNTER — Ambulatory Visit: Payer: Self-pay | Admitting: Internal Medicine

## 2024-04-11 ENCOUNTER — Ambulatory Visit (INDEPENDENT_AMBULATORY_CARE_PROVIDER_SITE_OTHER): Admitting: Internal Medicine

## 2024-04-11 VITALS — BP 126/82 | HR 55 | Temp 98.7°F | Ht 66.0 in | Wt 191.4 lb

## 2024-04-11 DIAGNOSIS — I872 Venous insufficiency (chronic) (peripheral): Secondary | ICD-10-CM

## 2024-04-11 DIAGNOSIS — Z7984 Long term (current) use of oral hypoglycemic drugs: Secondary | ICD-10-CM | POA: Diagnosis not present

## 2024-04-11 DIAGNOSIS — Z0001 Encounter for general adult medical examination with abnormal findings: Secondary | ICD-10-CM

## 2024-04-11 DIAGNOSIS — N1831 Chronic kidney disease, stage 3a: Secondary | ICD-10-CM

## 2024-04-11 DIAGNOSIS — E7800 Pure hypercholesterolemia, unspecified: Secondary | ICD-10-CM

## 2024-04-11 DIAGNOSIS — E559 Vitamin D deficiency, unspecified: Secondary | ICD-10-CM

## 2024-04-11 DIAGNOSIS — Z7901 Long term (current) use of anticoagulants: Secondary | ICD-10-CM

## 2024-04-11 DIAGNOSIS — E1165 Type 2 diabetes mellitus with hyperglycemia: Secondary | ICD-10-CM | POA: Diagnosis not present

## 2024-04-11 DIAGNOSIS — E538 Deficiency of other specified B group vitamins: Secondary | ICD-10-CM

## 2024-04-11 DIAGNOSIS — I10 Essential (primary) hypertension: Secondary | ICD-10-CM

## 2024-04-11 DIAGNOSIS — Z125 Encounter for screening for malignant neoplasm of prostate: Secondary | ICD-10-CM

## 2024-04-11 LAB — URINALYSIS, ROUTINE W REFLEX MICROSCOPIC
Bilirubin Urine: NEGATIVE
Hgb urine dipstick: NEGATIVE
Ketones, ur: NEGATIVE
Leukocytes,Ua: NEGATIVE
Nitrite: NEGATIVE
Specific Gravity, Urine: 1.01 (ref 1.000–1.030)
Total Protein, Urine: NEGATIVE
Urine Glucose: >=1000 — AB
Urobilinogen, UA: 1 (ref 0.0–1.0)
WBC, UA: NONE SEEN (ref 0–?)
pH: 7.5 (ref 5.0–8.0)

## 2024-04-11 LAB — TSH: TSH: 3.3 u[IU]/mL (ref 0.35–5.50)

## 2024-04-11 LAB — LIPID PANEL
Cholesterol: 77 mg/dL (ref 0–200)
HDL: 56.1 mg/dL (ref >39.00)
LDL Cholesterol: 7 mg/dL (ref 0–99)
NonHDL: 20.79
Total CHOL/HDL Ratio: 1
Triglycerides: 71 mg/dL (ref 0.0–149.0)
VLDL: 14.2 mg/dL (ref 0.0–40.0)

## 2024-04-11 LAB — MICROALBUMIN / CREATININE URINE RATIO
Creatinine,U: 82 mg/dL
Microalb Creat Ratio: 75.4 mg/g — ABNORMAL HIGH (ref 0.0–30.0)
Microalb, Ur: 6.2 mg/dL — ABNORMAL HIGH (ref 0.0–1.9)

## 2024-04-11 LAB — VITAMIN D 25 HYDROXY (VIT D DEFICIENCY, FRACTURES): VITD: 90.03 ng/mL (ref 30.00–100.00)

## 2024-04-11 LAB — VITAMIN B12: Vitamin B-12: 640 pg/mL (ref 211–911)

## 2024-04-11 LAB — HEMOGLOBIN A1C: Hgb A1c MFr Bld: 6.3 % (ref 4.6–6.5)

## 2024-04-11 LAB — POCT INR: INR: 2 (ref 2.0–3.0)

## 2024-04-11 LAB — PSA: PSA: 2.44 ng/mL (ref 0.10–4.00)

## 2024-04-11 MED ORDER — AMOXICILLIN 500 MG PO CAPS: ORAL_CAPSULE | ORAL | 3 refills | Status: DC

## 2024-04-11 NOTE — Progress Notes (Unsigned)
 Patient ID: Austin [name], male   DOB: 08-21-1955, 68 y.o.   MRN: [id number]         Chief Complaint:: wellness exam and chronic anticoagulation, ckd3a,dm, right leg venous insufficiency , htn, hld       HPI:  Austin [name] is a 68 y.o. male here for wellness exam; has eye exam sched in 2 wks.plans to have flu shot in October, o/w up to date                        Also Pt denies chest pain, increased sob or doe, wheezing, orthopnea, PND, , palpitations, dizziness or syncope except RLE below the knee large venous insuffiency swelling not helped with hct..   Pt denies polydipsia, polyuria, or new focal neuro s/s.   Has been tolerating metformin  well.  Has regular coumadin  checks.     Wt Readings from Last 3 Encounters:  04/11/24 191 lb 6.4 oz (86.8 kg)  04/03/24 197 lb 6.4 oz (89.5 kg)  03/05/24 194 lb 9.6 oz (88.3 kg)   BP Readings from Last 3 Encounters:  04/11/24 126/82  04/03/24 124/64  03/05/24 108/62   Immunization History  Administered Date(s) Administered   Fluad Trivalent(High Dose 65+) 04/04/2023   Influenza Split 05/15/2011, 07/29/2012   Influenza Whole 07/19/2005, 04/30/2006, 08/12/2009   Influenza,inj,Quad PF,6+ Mos 05/13/2013, 07/01/2014, 05/21/2015, 04/21/2016, 04/11/2017, 05/02/2018, 04/22/2019   Influenza-Unspecified 05/03/2020   PFIZER Comirnaty(Gray Top)Covid-19 Tri-Sucrose Vaccine 11/02/2020   PFIZER(Purple Top)SARS-COV-2 Vaccination 10/13/2019, 11/03/2019, 05/03/2020, 04/18/2021   Pneumococcal Conjugate-13 07/03/2013   Pneumococcal Polysaccharide-23 03/07/2006, 10/11/2021   Td 03/30/2009   Tdap 09/17/2019   Zoster Recombinant(Shingrix) 05/01/2022, 07/03/2022   Health Maintenance Due  Topic Date Due   OPHTHALMOLOGY EXAM  10/13/2023      Past Medical History:  Diagnosis Date   Allergy    Anemia    iron def   Aortic valve prosthesis present    a. SJM - [facility] 2003, chronic coumadin .   Back pain    CKD (chronic kidney disease), stage II    Coronary  artery disease    not sure where this dx came from - pt denies   Family history of melanoma    Glaucoma    Hearing loss    Hyperlipidemia    Hypertension    Internal hemorrhoid    Kaposi sarcoma (HCC)    Obesity    Pericardial effusion    Thoracic aortic aneurysm (HCC)    a. s/p dissection and repair @ [facility] 2003 with SJM AVR with residual findings followed by CT.   Thyroid  disease    Past Surgical History:  Procedure Laterality Date   ABDOMINAL AORTIC ANEURYSM REPAIR     AORTIC VALVE REPLACEMENT     CARDIAC VALVE REPLACEMENT     impingment     right shoulder   IR IMAGING GUIDED PORT INSERTION  09/17/2020   KNEE ARTHROSCOPY     left   WISDOM TOOTH EXTRACTION      reports that he quit smoking about 22 years ago. His smoking use included cigarettes. He started smoking about 37 years ago. He has a 15 pack-year smoking history. He has never used smokeless tobacco. He reports current alcohol use of about 7.0 standard drinks of alcohol per week. He reports that he does not use drugs. family history includes Arthritis in his mother; Cancer in his father; Diabetes in his father; Heart disease in his paternal aunt; Hypertension in his brother;  Liver disease in his sister; Melanoma in his maternal uncle; Other in his maternal grandfather; Stroke in his mother; Stroke (age of onset: 63) in his father. Allergies  Allergen Reactions   Celecoxib Itching and Other (See Comments)    Joint pain   Current Outpatient Medications on File Prior to Visit  Medication Sig Dispense Refill   Evolocumab  (REPATHA  SURECLICK) 140 MG/ML SOAJ Inject 140 mg into the skin every 14 (fourteen) days. 6 mL 3   FARXIGA  10 MG TABS tablet TAKE 1 TABLET BY MOUTH DAILY  BEFORE BREAKFAST 90 tablet 3   fenofibrate  160 MG tablet TAKE 1 TABLET BY MOUTH DAILY 90 tablet 0   glucose blood (ONETOUCH VERIO) test strip Use to check blood sugars daily 100 each 3   Lancets (ONETOUCH ULTRASOFT) lancets Use as instructed 100 each 12    lidocaine -prilocaine  (EMLA ) cream Apply 1 application topically as needed. 30 g 0   losartan  (COZAAR ) 25 MG tablet TAKE 1 TABLET BY MOUTH DAILY 90 tablet 0   metFORMIN  (GLUCOPHAGE -XR) 500 MG 24 hr tablet Take 2 tablets (1,000 mg total) by mouth daily with breakfast. 180 tablet 3   metoprolol  (TOPROL -XL) 200 MG 24 hr tablet TAKE 1 TABLET BY MOUTH DAILY 90 tablet 0   Multiple Vitamin (MULTIVITAMIN) tablet Take 1 tablet by mouth daily.     rosuvastatin  (CRESTOR ) 40 MG tablet TAKE 1 TABLET BY MOUTH DAILY 90 tablet 0   warfarin (COUMADIN ) 5 MG tablet TAKE 1 TABLET BY MOUTH DAILY  EXCEPT TAKE 1/2 TABLET BY MOUTH ON WEDNESDAYS OR AS  DIRECTED BY ANTICOAGULATION  CLINIC 100 tablet 3   No current facility-administered medications on file prior to visit.        ROS:  All others reviewed and negative.  Objective        PE:  BP 126/82   Pulse (!) 55   Temp 98.7 F (37.1 C)   Ht 5' 6 (1.676 m)   Wt 191 lb 6.4 oz (86.8 kg)   SpO2 99%   BMI 30.89 kg/m                 Constitutional: Pt appears in NAD               HENT: Head: NCAT.                Right Ear: External ear normal.                 Left Ear: External ear normal.                Eyes: . Pupils are equal, round, and reactive to light. Conjunctivae and EOM are normal               Nose: without d/c or deformity               Neck: Neck supple. Gross normal ROM               Cardiovascular: Normal rate and regular rhythm.                 Pulmonary/Chest: Effort normal and breath sounds without rales or wheezing.                Abd:  Soft, NT, ND, + BS, no organomegaly               Neurological: Pt is alert. At baseline orientation, motor grossly intact  Skin: Skin is warm. No rashes, no other new lesions, LE edema - RLE large venous swelling below knee               Psychiatric: Pt behavior is normal without agitation   Micro: none  Cardiac tracings I have personally interpreted today:  none  Pertinent Radiological  findings (summarize): none   Lab Results  Component Value Date   WBC 5.3 04/03/2024   HGB 14.2 04/03/2024   HCT 41.8 04/03/2024   PLT 186 04/03/2024   GLUCOSE 84 04/03/2024   CHOL 77 04/11/2024   TRIG 71.0 04/11/2024   HDL 56.10 04/11/2024   LDLDIRECT 107.0 10/11/2021   LDLCALC 7 04/11/2024   ALT 35 04/03/2024   AST 36 04/03/2024   NA 139 04/03/2024   K 4.4 04/03/2024   CL 105 04/03/2024   CREATININE 1.60 (H) 04/03/2024   BUN 29 (H) 04/03/2024   CO2 31 04/03/2024   TSH 3.30 04/11/2024   PSA 2.44 04/11/2024   INR 2.0 04/11/2024   HGBA1C 6.3 04/11/2024   MICROALBUR 6.2 (H) 04/11/2024   Assessment/Plan:  DAMARIE SCHOOLFIELD is a 68 y.o. Black or African American [2] male with  has a past medical history of Allergy, Anemia, Aortic valve prosthesis present, Back pain, CKD (chronic kidney disease), stage II, Coronary artery disease, Family history of melanoma, Glaucoma, Hearing loss, Hyperlipidemia, Hypertension, Internal hemorrhoid, Kaposi sarcoma (HCC), Obesity, Pericardial effusion, Thoracic aortic aneurysm (HCC), and Thyroid  disease.  Chronic anticoagulation Lab Results  Component Value Date   INR 2.0 04/11/2024   INR 2.9 03/04/2024   INR 3.3 (A) 02/19/2024   PROTIME 20.4 01/19/2009   Stable, cont coumadin  clinic  Encounter for well adult exam with abnormal findings Age and sex appropriate education and counseling updated with regular exercise and diet Referrals for preventative services - has eye exam sched in 2 wks Immunizations addressed - plans to have flu shot in October at pharmacy Smoking counseling  - none needed Evidence for depression or other mood disorder - none significant Most recent labs reviewed. I have personally reviewed and have noted: 1) the patient's medical and social history 2) The patient's current medications and supplements 3) The patient's height, weight, and BMI have been recorded in the chart   CKD (chronic kidney disease), stage III  (HCC) Lab Results  Component Value Date   CREATININE 1.60 (H) 04/03/2024   Stable overall, cont to avoid nephrotoxins   Diabetes (HCC) Lab Results  Component Value Date   HGBA1C 6.3 04/11/2024   Stable, pt to continue current medical treatment farxiga  10 every day, and metfomrin ER 500 mg - 2 qd   Essential hypertension BP Readings from Last 3 Encounters:  04/11/24 126/82  04/03/24 124/64  03/05/24 108/62   Stable, pt to continue medical treatment losartan  25 mg every day, toprol  xl 200 qd   HLD (hyperlipidemia) Lab Results  Component Value Date   LDLCALC 7 04/11/2024   Stable, pt to continue current statin fenofibrate  160 mg, crestor  40 qd   Venous insufficiency Lerge RLE area below the knee - d/c hct, but start daytime compression stockings, low salt, exercise, wt control  Followup: Return in about 6 months (around 10/09/2024).  Lynwood Rush, MD 04/12/2024 6:36 PM [facility]

## 2024-04-11 NOTE — Progress Notes (Signed)
 Pt is also has apt with PCP today. Continue 1 tablet daily except take 1/2 tablet on Monday, Wednesday and Friday. Recheck in 4 weeks.

## 2024-04-11 NOTE — Progress Notes (Signed)
 The test results show that your current treatment is OK, as the tests are stable.  Please continue the same plan.  There is no other need for change of treatment or further evaluation based on these results, at this time.  thanks

## 2024-04-11 NOTE — Patient Instructions (Signed)
 Please remember your flu shot in October at the pharmacy  Please start compression stocking for the right leg only , and during daytime hours only (not night time)  Ok to stop the mild fluid pill  Please continue all other medications as before, and refills have been done if requested.  Please have the pharmacy call with any other refills you may need.  Please continue your efforts at being more active, low cholesterol diet, and weight control.  You are otherwise up to date with prevention measures today.  Please keep your appointments with your specialists as you may have planned  Please go to the LAB at the blood drawing area for the tests to be done  You will be contacted by phone if any changes need to be made immediately.  Otherwise, you will receive a letter about your results with an explanation, but please check with MyChart first.  Please make an Appointment to return in 6 months, or sooner if needed

## 2024-04-11 NOTE — Patient Instructions (Addendum)
 Pre visit review using our clinic review tool, if applicable. No additional management support is needed unless otherwise documented below in the visit note.  Continue 1 tablet daily except take 1/2 tablet on Monday, Wednesday and Friday . Recheck in 4 weeks.

## 2024-04-12 ENCOUNTER — Encounter: Payer: Self-pay | Admitting: Internal Medicine

## 2024-04-12 DIAGNOSIS — I872 Venous insufficiency (chronic) (peripheral): Secondary | ICD-10-CM | POA: Insufficient documentation

## 2024-04-12 NOTE — Assessment & Plan Note (Signed)
 Lab Results  Component Value Date   LDLCALC 7 04/11/2024   Stable, pt to continue current statin fenofibrate  160 mg, crestor  40 qd

## 2024-04-12 NOTE — Assessment & Plan Note (Addendum)
 BP Readings from Last 3 Encounters:  04/11/24 126/82  04/03/24 124/64  03/05/24 108/62   Stable, pt to continue medical treatment losartan  25 mg every day, toprol  xl 200 qd

## 2024-04-12 NOTE — Assessment & Plan Note (Signed)
 Lab Results  Component Value Date   INR 2.0 04/11/2024   INR 2.9 03/04/2024   INR 3.3 (A) 02/19/2024   PROTIME 20.4 01/19/2009   Stable, cont coumadin  clinic

## 2024-04-12 NOTE — Assessment & Plan Note (Signed)
 Lerge RLE area below the knee - d/c hct, but start daytime compression stockings, low salt, exercise, wt control

## 2024-04-12 NOTE — Assessment & Plan Note (Signed)
 Lab Results  Component Value Date   CREATININE 1.60 (H) 04/03/2024   Stable overall, cont to avoid nephrotoxins

## 2024-04-12 NOTE — Assessment & Plan Note (Signed)
 Age and sex appropriate education and counseling updated with regular exercise and diet Referrals for preventative services - has eye exam sched in 2 wks Immunizations addressed - plans to have flu shot in October at pharmacy Smoking counseling  - none needed Evidence for depression or other mood disorder - none significant Most recent labs reviewed. I have personally reviewed and have noted: 1) the patient's medical and social history 2) The patient's current medications and supplements 3) The patient's height, weight, and BMI have been recorded in the chart

## 2024-04-12 NOTE — Assessment & Plan Note (Signed)
 Lab Results  Component Value Date   HGBA1C 6.3 04/11/2024   Stable, pt to continue current medical treatment farxiga  10 every day, and metfomrin ER 500 mg - 2 qd

## 2024-04-28 ENCOUNTER — Encounter: Payer: Self-pay | Admitting: Internal Medicine

## 2024-04-29 MED ORDER — AMOXICILLIN 500 MG PO CAPS: ORAL_CAPSULE | ORAL | 3 refills | Status: AC

## 2024-05-06 ENCOUNTER — Other Ambulatory Visit (HOSPITAL_COMMUNITY): Payer: Self-pay

## 2024-05-06 MED ORDER — FLUZONE HIGH-DOSE 0.5 ML IM SUSY
PREFILLED_SYRINGE | INTRAMUSCULAR | 0 refills | Status: AC
  Filled 2024-05-06: qty 0.5, 1d supply, fill #0

## 2024-05-06 MED ORDER — COVID-19 MRNA VAC-TRIS(PFIZER) 30 MCG/0.3ML IM SUSY
PREFILLED_SYRINGE | INTRAMUSCULAR | 0 refills | Status: AC
  Filled 2024-05-06: qty 0.3, 1d supply, fill #0

## 2024-05-09 ENCOUNTER — Ambulatory Visit

## 2024-05-09 DIAGNOSIS — Z7901 Long term (current) use of anticoagulants: Secondary | ICD-10-CM

## 2024-05-09 LAB — POCT INR: INR: 2.3 (ref 2.0–3.0)

## 2024-05-09 NOTE — Progress Notes (Signed)
 Pt reports diuretic was stopped a few weeks ago. No other changes. Continue 1 tablet daily except take 1/2 tablet on Monday, Wednesday and Friday. Recheck in 4 weeks.

## 2024-05-09 NOTE — Patient Instructions (Addendum)
 Pre visit review using our clinic review tool, if applicable. No additional management support is needed unless otherwise documented below in the visit note.  Continue 1 tablet daily except take 1/2 tablet on Monday, Wednesday and Friday . Recheck in 4 weeks.

## 2024-06-02 ENCOUNTER — Encounter: Payer: Self-pay | Admitting: Radiology

## 2024-06-06 ENCOUNTER — Ambulatory Visit

## 2024-06-06 DIAGNOSIS — Z7901 Long term (current) use of anticoagulants: Secondary | ICD-10-CM

## 2024-06-06 LAB — POCT INR: INR: 1.9 — AB (ref 2.0–3.0)

## 2024-06-06 NOTE — Patient Instructions (Addendum)
 Pre visit review using our clinic review tool, if applicable. No additional management support is needed unless otherwise documented below in the visit note.  Increase dose today to take 1 tablet and then continue 1 tablet daily except take 1/2 tablet on Monday, Wednesday and Friday. Recheck in 2 weeks.

## 2024-06-06 NOTE — Progress Notes (Addendum)
 Indication: AVR Pt reports eating more greens recently. Advised to keep these consistent in his diet. Pt verbalized understanding.  Increase dose today to take 1 tablet and then continue 1 tablet daily except take 1/2 tablet on Monday, Wednesday and Friday. Recheck in 2 weeks.

## 2024-06-15 ENCOUNTER — Other Ambulatory Visit: Payer: Self-pay | Admitting: Internal Medicine

## 2024-06-16 ENCOUNTER — Other Ambulatory Visit: Payer: Self-pay

## 2024-06-17 ENCOUNTER — Ambulatory Visit

## 2024-06-17 DIAGNOSIS — Z7901 Long term (current) use of anticoagulants: Secondary | ICD-10-CM

## 2024-06-17 LAB — POCT INR: INR: 2.2 (ref 2.0–3.0)

## 2024-06-17 NOTE — Patient Instructions (Addendum)
 Pre visit review using our clinic review tool, if applicable. No additional management support is needed unless otherwise documented below in the visit note.  Continue 1 tablet daily except take 1/2 tablet on Monday, Wednesday and Friday . Recheck in 4 weeks.

## 2024-06-17 NOTE — Progress Notes (Signed)
 Indication: AVR Continue 1 tablet daily except take 1/2 tablet on Monday, Wednesday and Friday. Recheck in 4 weeks.

## 2024-07-03 ENCOUNTER — Inpatient Hospital Stay: Attending: Hematology and Oncology

## 2024-07-03 ENCOUNTER — Encounter: Payer: Self-pay | Admitting: Hematology and Oncology

## 2024-07-03 ENCOUNTER — Ambulatory Visit

## 2024-07-03 ENCOUNTER — Telehealth: Payer: Self-pay

## 2024-07-03 ENCOUNTER — Inpatient Hospital Stay: Attending: Hematology and Oncology | Admitting: Hematology and Oncology

## 2024-07-03 VITALS — BP 122/75 | HR 68 | Temp 98.6°F | Resp 18 | Ht 66.0 in | Wt 194.1 lb

## 2024-07-03 VITALS — BP 137/87 | HR 61 | Temp 98.3°F | Resp 16 | Wt 192.2 lb

## 2024-07-03 DIAGNOSIS — Z5111 Encounter for antineoplastic chemotherapy: Secondary | ICD-10-CM | POA: Diagnosis present

## 2024-07-03 DIAGNOSIS — C469 Kaposi's sarcoma, unspecified: Secondary | ICD-10-CM | POA: Insufficient documentation

## 2024-07-03 LAB — CMP (CANCER CENTER ONLY)
ALT: 30 U/L (ref 0–44)
AST: 39 U/L (ref 15–41)
Albumin: 4.1 g/dL (ref 3.5–5.0)
Alkaline Phosphatase: 26 U/L — ABNORMAL LOW (ref 38–126)
Anion gap: 7 (ref 5–15)
BUN: 21 mg/dL (ref 8–23)
CO2: 26 mmol/L (ref 22–32)
Calcium: 8.9 mg/dL (ref 8.9–10.3)
Chloride: 109 mmol/L (ref 98–111)
Creatinine: 1.46 mg/dL — ABNORMAL HIGH (ref 0.61–1.24)
GFR, Estimated: 52 mL/min — ABNORMAL LOW (ref >60)
Glucose, Bld: 146 mg/dL — ABNORMAL HIGH (ref 70–99)
Potassium: 4.2 mmol/L (ref 3.5–5.1)
Sodium: 142 mmol/L (ref 135–145)
Total Bilirubin: 0.7 mg/dL (ref 0.0–1.2)
Total Protein: 6.5 g/dL (ref 6.5–8.1)

## 2024-07-03 LAB — CBC WITH DIFFERENTIAL (CANCER CENTER ONLY)
Abs Immature Granulocytes: 0.01 K/uL (ref 0.00–0.07)
Basophils Absolute: 0 K/uL (ref 0.0–0.1)
Basophils Relative: 1 %
Eosinophils Absolute: 0.1 K/uL (ref 0.0–0.5)
Eosinophils Relative: 2 %
HCT: 40.9 % (ref 39.0–52.0)
Hemoglobin: 13.7 g/dL (ref 13.0–17.0)
Immature Granulocytes: 0 %
Lymphocytes Relative: 21 %
Lymphs Abs: 0.9 K/uL (ref 0.7–4.0)
MCH: 28.1 pg (ref 26.0–34.0)
MCHC: 33.5 g/dL (ref 30.0–36.0)
MCV: 84 fL (ref 80.0–100.0)
Monocytes Absolute: 0.5 K/uL (ref 0.1–1.0)
Monocytes Relative: 11 %
Neutro Abs: 2.8 K/uL (ref 1.7–7.7)
Neutrophils Relative %: 65 %
Platelet Count: 164 K/uL (ref 150–400)
RBC: 4.87 MIL/uL (ref 4.22–5.81)
RDW: 13.4 % (ref 11.5–15.5)
WBC Count: 4.2 K/uL (ref 4.0–10.5)
nRBC: 0 % (ref 0.0–0.2)

## 2024-07-03 MED ORDER — DEXTROSE 5 % IV SOLN: Freq: Once | INTRAVENOUS | Status: AC

## 2024-07-03 MED ORDER — DOXORUBICIN HCL LIPOSOMAL CHEMO INJECTION 2 MG/ML
27.0000 mg/m2 | Freq: Once | INTRAVENOUS | Status: AC
  Administered 2024-07-03: 60 mg via INTRAVENOUS
  Filled 2024-07-03: qty 30

## 2024-07-03 MED ORDER — DEXAMETHASONE SOD PHOSPHATE PF 10 MG/ML IJ SOLN
10.0000 mg | Freq: Once | INTRAMUSCULAR | Status: AC
  Administered 2024-07-03: 10 mg via INTRAVENOUS

## 2024-07-03 MED ORDER — SODIUM CHLORIDE 0.9% FLUSH: 10.0000 mL | INTRAVENOUS | Status: DC | PRN

## 2024-07-03 NOTE — Patient Instructions (Signed)
 [facility] - A DEPT OF [facility]  Discharge Instructions: Thank you for choosing [facility] to provide your oncology and hematology care.   If you have a lab appointment with the Cancer Center, please go directly to the Cancer Center and check in at the registration area.   Wear comfortable clothing and clothing appropriate for easy access to any Portacath or PICC line.   We strive to give you quality time with your provider. You may need to reschedule your appointment if you arrive late (15 or more minutes).  Arriving late affects you and other patients whose appointments are after yours.  Also, if you miss three or more appointments without notifying the office, you may be dismissed from the clinic at the provider's discretion.      For prescription refill requests, have your pharmacy contact our office and allow 72 hours for refills to be completed.    Today you received the following chemotherapy and/or immunotherapy agents: doxorubicin  liposomal      To help prevent nausea and vomiting after your treatment, we encourage you to take your nausea medication as directed.  BELOW ARE SYMPTOMS THAT SHOULD BE REPORTED IMMEDIATELY: *FEVER GREATER THAN 100.4 F (38 C) OR HIGHER *CHILLS OR SWEATING *NAUSEA AND VOMITING THAT IS NOT CONTROLLED WITH YOUR NAUSEA MEDICATION *UNUSUAL SHORTNESS OF BREATH *UNUSUAL BRUISING OR BLEEDING *URINARY PROBLEMS (pain or burning when urinating, or frequent urination) *BOWEL PROBLEMS (unusual diarrhea, constipation, pain near the anus) TENDERNESS IN MOUTH AND THROAT WITH OR WITHOUT PRESENCE OF ULCERS (sore throat, sores in mouth, or a toothache) UNUSUAL RASH, SWELLING OR PAIN  UNUSUAL VAGINAL DISCHARGE OR ITCHING   Items with * indicate a potential emergency and should be followed up as soon as possible or go to the Emergency Department if any problems should occur.  Please show the CHEMOTHERAPY ALERT CARD or  IMMUNOTHERAPY ALERT CARD at check-in to the Emergency Department and triage nurse.  Should you have questions after your visit or need to cancel or reschedule your appointment, please contact [facility] - A DEPT OF [facility]  Dept: [phone]  and follow the prompts.  Office hours are 8:00 a.m. to 4:30 p.m. Monday - Friday. Please note that voicemails left after 4:00 p.m. may not be returned until the following business day.  We are closed weekends and major holidays. You have access to a nurse at all times for urgent questions. Please call the main number to the clinic Dept: [phone] and follow the prompts.   For any non-urgent questions, you may also contact your provider using MyChart. We now offer e-Visits for anyone 66 and older to request care online for non-urgent symptoms. For details visit [email or web address].   Also download the MyChart app! Go to the app store, search MyChart, open the app, select [facility], and log in with your MyChart username and password.

## 2024-07-03 NOTE — Assessment & Plan Note (Addendum)
 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, well-controlled with liposomal doxorubicin  since 2019 The patient that he has recurrent skin disease at approximately 3 months after each treatment Originally, liposomal doxorubicin  was given every 4 months, most recently changed to every 3 months and that was able to control his disease Recent echocardiogram from August 2025 shows stable heart function He will proceed with treatment without delay I plan to monitor cardiac function once a year, next due August 2026 I recommend referral to [facility] sarcoma clinic for another review to see whether there are new recommendation/treatment for him

## 2024-07-03 NOTE — Progress Notes (Signed)
 [facility] OFFICE PROGRESS NOTE  Patient Care Team: Norleen Lynwood ORN, MD as PCP - Diedre Cash, Lonni BIRCH, MD as PCP - Cardiology (Cardiology)  Assessment & Plan Kaposi sarcoma [facility]) 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, well-controlled with liposomal doxorubicin  since 2019 The patient that he has recurrent skin disease at approximately 3 months after each treatment Originally, liposomal doxorubicin  was given every 4 months, most recently changed to every 3 months and that was able to control his disease Recent echocardiogram from August 2025 shows stable heart function He will proceed with treatment without delay I plan to monitor cardiac function once a year, next due August 2026 I recommend referral to [facility] sarcoma clinic for another review to see whether there are new recommendation/treatment for him  No orders of the defined types were placed in this encounter.    Almarie Bedford, MD  INTERVAL HISTORY: he returns for treatment follow-up Complications related to previous cycle of chemotherapy included none  PHYSICAL EXAMINATION: ECOG PERFORMANCE STATUS: 0 - Asymptomatic  No results found for: [id number]    Latest Ref Rng & Units 07/03/2024    9:18 AM 04/03/2024   11:47 AM 01/04/2024    8:06 AM  CBC  WBC 4.0 - 10.5 K/uL 4.2  5.3  4.2   Hemoglobin 13.0 - 17.0 g/dL 86.2  85.7  86.3   Hematocrit 39.0 - 52.0 % 40.9  41.8  40.5   Platelets 150 - 400 K/uL 164  186  187       Chemistry      Component Value Date/Time   NA 139 04/03/2024 1147   NA 142 07/03/2023 0951   NA 143 11/03/2016 0908   K 4.4 04/03/2024 1147   K 5.0 11/03/2016 0908   CL 105 04/03/2024 1147   CO2 31 04/03/2024 1147   CO2 27 11/03/2016 0908   BUN 29 (H) 04/03/2024 1147   BUN 29 (H) 07/03/2023 0951   BUN 33.5 (H) 11/03/2016 0908   CREATININE 1.60 (H)  04/03/2024 1147   CREATININE 1.5 (H) 11/03/2016 0908      Component Value Date/Time   CALCIUM  9.4 04/03/2024 1147   CALCIUM  9.5 11/03/2016 0908   ALKPHOS 23 (L) 04/03/2024 1147   ALKPHOS 25 (L) 11/03/2016 0908   AST 36 04/03/2024 1147   AST 29 11/03/2016 0908   ALT 35 04/03/2024 1147   ALT 29 11/03/2016 0908   BILITOT 0.6 04/03/2024 1147   BILITOT 0.70 11/03/2016 0908       There were no vitals filed for this visit. There were no vitals filed for this visit. Other relevant data reviewed during this visit included CBC and CMP

## 2024-07-03 NOTE — Telephone Encounter (Signed)
 Faxed referral to Dr. Lang Favorite at [facility] at [phone], received fax confirmation. Called office at [phone] to get fax #.

## 2024-07-08 ENCOUNTER — Telehealth: Payer: Self-pay

## 2024-07-08 NOTE — Telephone Encounter (Signed)
 Is this okay?

## 2024-07-08 NOTE — Telephone Encounter (Signed)
 Copied from CRM #[id number]. Topic: Appointments - Scheduling Inquiry for Clinic >> Jul 08, 2024 10:36 AM Mercedes MATSU wrote: Reason for CRM: Patient is requesting to ask Dr. Joshua if he would be willing to take him on as a new patient from Dr. Norleen due to him retiring. Patient can be reached at [phone].

## 2024-07-15 ENCOUNTER — Ambulatory Visit

## 2024-07-15 DIAGNOSIS — Z7901 Long term (current) use of anticoagulants: Secondary | ICD-10-CM

## 2024-07-15 LAB — POCT INR: INR: 2.3 (ref 2.0–3.0)

## 2024-07-15 NOTE — Progress Notes (Signed)
 Indication: AVR Continue 1 tablet daily except take 1/2 tablet on Monday, Wednesday and Friday. Recheck in 4 weeks.

## 2024-07-15 NOTE — Patient Instructions (Addendum)
 Pre visit review using our clinic review tool, if applicable. No additional management support is needed unless otherwise documented below in the visit note.  Continue 1 tablet daily except take 1/2 tablet on Monday, Wednesday and Friday . Recheck in 4 weeks.

## 2024-08-12 ENCOUNTER — Ambulatory Visit

## 2024-08-12 DIAGNOSIS — Z7901 Long term (current) use of anticoagulants: Secondary | ICD-10-CM

## 2024-08-12 LAB — POCT INR: INR: 2 (ref 2.0–3.0)

## 2024-08-12 NOTE — Progress Notes (Signed)
 Indication: AVR Continue 1 tablet daily except take 1/2 tablet on Monday, Wednesday and Friday. Recheck in 6 weeks.

## 2024-08-12 NOTE — Telephone Encounter (Signed)
 Pt was in coumadin  clinic today and reported he has not had a response from the clinic yet concerning transferring his care to Dr. Joshua, due to Dr. Norleen retiring. He wanted to know if Dr. Joshua will take him as a pt. Advised the msg will be sent to Dr. Joshua again.

## 2024-08-12 NOTE — Patient Instructions (Addendum)
 Pre visit review using our clinic review tool, if applicable. No additional management support is needed unless otherwise documented below in the visit note.  Continue 1 tablet daily except take 1/2 tablet on Monday, Wednesday and Friday. Recheck in  6 weeks.

## 2024-08-18 NOTE — Telephone Encounter (Signed)
 LVM

## 2024-08-19 ENCOUNTER — Telehealth: Payer: Self-pay

## 2024-08-19 NOTE — Telephone Encounter (Signed)
 Returned his call. He was seen at [facility] on 1/6. He is asking if Dr. Lonn can look at the recommendations from [facility]. Told him the office would call back on 1/22 after Dr. Lonn returns to the office.

## 2024-08-19 NOTE — Telephone Encounter (Signed)
 Pt returned call. Scheduled pt for transfer of care apt with Dr. Joshua. Pt appreciative of the help.

## 2024-08-19 NOTE — Telephone Encounter (Signed)
 Pt LVM.  Tried to contact pt but had to LVM.

## 2024-08-21 ENCOUNTER — Encounter: Payer: Self-pay | Admitting: Hematology and Oncology

## 2024-08-21 NOTE — Telephone Encounter (Signed)
 I can see him on 2/2 to review all recommendations

## 2024-08-21 NOTE — Telephone Encounter (Signed)
 Called and scheduled appt on 2/2 and he is aware of appts.

## 2024-08-29 ENCOUNTER — Telehealth: Payer: Self-pay | Admitting: Hematology and Oncology

## 2024-09-01 ENCOUNTER — Inpatient Hospital Stay: Admitting: Hematology and Oncology

## 2024-09-02 ENCOUNTER — Inpatient Hospital Stay: Attending: Hematology and Oncology | Admitting: Hematology and Oncology

## 2024-09-02 ENCOUNTER — Encounter: Payer: Self-pay | Admitting: Hematology and Oncology

## 2024-09-02 VITALS — BP 141/65 | HR 65 | Temp 97.9°F | Resp 17 | Wt 188.0 lb

## 2024-09-02 DIAGNOSIS — C469 Kaposi's sarcoma, unspecified: Secondary | ICD-10-CM

## 2024-09-02 DIAGNOSIS — I712 Thoracic aortic aneurysm, without rupture, unspecified: Secondary | ICD-10-CM | POA: Diagnosis not present

## 2024-09-02 NOTE — Progress Notes (Signed)
 [facility] OFFICE PROGRESS NOTE  Patient Care Team: Norleen Lynwood ORN, MD as PCP - Diedre Cash, Lonni BIRCH, MD as PCP - Cardiology (Cardiology)  Assessment & Plan Kaposi sarcoma [facility]) 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, well-controlled with liposomal doxorubicin  since 2019 The patient that he has recurrent skin disease at approximately 3 months after each treatment Originally, liposomal doxorubicin  was given every 4 months, most recently changed to every 3 months and that was able to control his disease Recent echocardiogram from August 2025 shows stable heart function Unfortunately, according to the patient, his skin disease is not under control and he was recently referred to [facility] sarcoma clinic for further recommendations  I have reviewed consult note We discussed proper staging of his disease now with CT imaging and repeat echocardiogram I have also taken a picture of his lower extremity with his permission to place on the chart I will see him back in 2 weeks with plan to start treatment with liposomal doxorubicin  at 20 mg/m every 14 days for total of 6 cycles If the patient has no visible improvement/disease control, I will refer him back to sarcoma clinic for next line of treatment Thoracic aortic aneurysm without rupture, unspecified part The patient is known to have history of aneurysm and valvular repair I will repeat echocardiogram prior to treatment and we will order CT imaging to assess disease status as well as status of his aneurysm  Orders Placed This Encounter  Procedures   CT CHEST ABDOMEN PELVIS W CONTRAST    Standing Status:   Future    Expected Date:   09/09/2024    Expiration Date:   09/02/2025    If indicated for the ordered procedure, I authorize the administration of contrast media per Radiology protocol:   Yes     Does the patient have a contrast media/X-ray dye allergy?:   No    Preferred imaging location?:   [facility]    If indicated for the ordered procedure, I authorize the administration of oral contrast media per Radiology protocol:   Yes   CBC with Differential (Cancer Center Only)    Standing Status:   Future    Expected Date:   09/18/2024    Expiration Date:   09/18/2025   CMP (Cancer Center only)    Standing Status:   Future    Expected Date:   09/18/2024    Expiration Date:   09/18/2025   CBC with Differential (Cancer Center Only)    Standing Status:   Future    Expected Date:   10/02/2024    Expiration Date:   10/02/2025   CMP (Cancer Center only)    Standing Status:   Future    Expected Date:   10/02/2024    Expiration Date:   10/02/2025   CBC with Differential (Cancer Center Only)    Standing Status:   Future    Expected Date:   10/16/2024    Expiration Date:   10/16/2025   CMP (Cancer Center only)    Standing Status:   Future    Expected Date:   10/16/2024    Expiration Date:   10/16/2025   CBC with Differential (Cancer Center Only)    Standing Status:   Future    Expected Date:   10/30/2024    Expiration Date:   10/30/2025   [facility]  only)    Standing Status:   Future    Expected Date:   10/30/2024    Expiration Date:   10/30/2025   CBC with Differential (Cancer Center Only)    Standing Status:   Future    Expected Date:   11/13/2024    Expiration Date:   11/13/2025   CMP (Cancer Center only)    Standing Status:   Future    Expected Date:   11/13/2024    Expiration Date:   11/13/2025   CBC with Differential (Cancer Center Only)    Standing Status:   Future    Expected Date:   11/27/2024    Expiration Date:   11/27/2025   CMP (Cancer Center only)    Standing Status:   Future    Expected Date:   11/27/2024    Expiration Date:   11/27/2025   ECHOCARDIOGRAM COMPLETE    Standing Status:   Future    Expected Date:   09/09/2024    Expiration Date:   09/02/2025    Perflutren  DEFINITY (image enhancing agent) should be administered unless hypersensitivity or allergy exist:   Administer Perflutren    Reason for exam-Echo:   Mitral valve disorder, 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    [facility] performed[facility]    Does the patient weigh less than or greater than 250 lbs?:   Patient weighs less than 250 lbs     Almarie Bedford, MD  INTERVAL HISTORY: he returns for surveillance follow-up to discuss recent treatment recommendation from the sarcoma clinic He complained of discomfort and swelling of both legs, right greater than left  PHYSICAL EXAMINATION: ECOG PERFORMANCE STATUS: 1 - Symptomatic but completely ambulatory  Vitals:   09/02/24 1501  BP: (!) 141/65  Pulse: 65  Resp: 17  Temp: 97.9 F (36.6 C)  SpO2: 100%   Filed Weights   09/02/24 1501  Weight: 188 lb (85.3 kg)    Relevant data reviewed during this visit included consult note

## 2024-09-02 NOTE — Assessment & Plan Note (Addendum)
 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, well-controlled with liposomal doxorubicin  since 2019 The patient that he has recurrent skin disease at approximately 3 months after each treatment Originally, liposomal doxorubicin  was given every 4 months, most recently changed to every 3 months and that was able to control his disease Recent echocardiogram from August 2025 shows stable heart function Unfortunately, according to the patient, his skin disease is not under control and he was recently referred to [facility] clinic for further recommendations  I have reviewed consult note We discussed proper staging of his disease now with CT imaging and repeat echocardiogram I have also taken a picture of his lower extremity with his permission to place on the chart I will see him back in 2 weeks with plan to start treatment with liposomal doxorubicin  at 20 mg/m every 14 days for total of 6 cycles If the patient has no visible improvement/disease control, I will refer him back to sarcoma clinic for next line of treatment

## 2024-09-03 ENCOUNTER — Ambulatory Visit (HOSPITAL_BASED_OUTPATIENT_CLINIC_OR_DEPARTMENT_OTHER)

## 2024-09-03 ENCOUNTER — Ambulatory Visit (HOSPITAL_COMMUNITY)
Admission: RE | Admit: 2024-09-03 | Discharge: 2024-09-03 | Attending: Cardiovascular Disease | Admitting: Cardiovascular Disease

## 2024-09-03 DIAGNOSIS — I712 Thoracic aortic aneurysm, without rupture, unspecified: Secondary | ICD-10-CM

## 2024-09-03 DIAGNOSIS — C469 Kaposi's sarcoma, unspecified: Secondary | ICD-10-CM

## 2024-09-09 ENCOUNTER — Ambulatory Visit (HOSPITAL_COMMUNITY)

## 2024-09-16 ENCOUNTER — Inpatient Hospital Stay: Admitting: Hematology and Oncology

## 2024-09-18 ENCOUNTER — Inpatient Hospital Stay

## 2024-09-23 ENCOUNTER — Ambulatory Visit

## 2024-10-02 ENCOUNTER — Inpatient Hospital Stay

## 2024-10-02 ENCOUNTER — Inpatient Hospital Stay: Admitting: Hematology and Oncology

## 2024-10-28 ENCOUNTER — Ambulatory Visit: Admitting: Internal Medicine

## 2025-04-14 ENCOUNTER — Encounter: Admitting: Internal Medicine

## 2025-04-14 ENCOUNTER — Ambulatory Visit
